# Patient Record
Sex: Female | Born: 1980 | Hispanic: Yes | Marital: Married | Smoking: Never smoker
Health system: Southern US, Community
[De-identification: ages and names within clinical notes are randomized; demographics above are authoritative.]

## PROBLEM LIST (undated history)

## (undated) DIAGNOSIS — Z9889 Other specified postprocedural states: Secondary | ICD-10-CM

## (undated) DIAGNOSIS — Z853 Personal history of malignant neoplasm of breast: Secondary | ICD-10-CM

## (undated) DIAGNOSIS — Z9221 Personal history of antineoplastic chemotherapy: Secondary | ICD-10-CM

## (undated) DIAGNOSIS — R112 Nausea with vomiting, unspecified: Secondary | ICD-10-CM

## (undated) DIAGNOSIS — Z923 Personal history of irradiation: Secondary | ICD-10-CM

---

## 2015-08-09 DIAGNOSIS — Z9221 Personal history of antineoplastic chemotherapy: Secondary | ICD-10-CM

## 2015-08-09 HISTORY — DX: Personal history of antineoplastic chemotherapy: Z92.21

## 2015-08-26 ENCOUNTER — Encounter: Payer: Self-pay | Admitting: Physician Assistant

## 2015-08-26 ENCOUNTER — Ambulatory Visit: Payer: Self-pay | Admitting: Physician Assistant

## 2015-08-26 VITALS — BP 124/68 | HR 83 | Temp 98.2°F | Ht 62.5 in | Wt 137.5 lb

## 2015-08-26 DIAGNOSIS — R519 Headache, unspecified: Secondary | ICD-10-CM

## 2015-08-26 DIAGNOSIS — K5900 Constipation, unspecified: Secondary | ICD-10-CM

## 2015-08-26 DIAGNOSIS — R51 Headache: Principal | ICD-10-CM

## 2015-08-26 DIAGNOSIS — N6459 Other signs and symptoms in breast: Secondary | ICD-10-CM

## 2015-08-26 MED ORDER — PROPRANOLOL HCL 20 MG PO TABS: ORAL_TABLET | ORAL | Status: DC

## 2015-08-26 NOTE — Progress Notes (Signed)
BP 124/68 mmHg  Pulse 83  Temp(Src) 98.2 F (36.8 C)  Ht 5' 2.5" (1.588 m)  Wt 137 lb 8 oz (62.37 kg)  BMI 24.73 kg/m2  SpO2 99%   Subjective:    Patient ID: Michele Salas, female    DOB: [birth year], 35 y.o.   MRN: [id number]  HPI: Michele Salas is a 35 y.o. female presenting on 08/26/2015 for Headache   HPI   Pt is here for f/u HA.  She has multiple other complaints that she wants to discuss including a breast problem, constipation and diarrhea.  She is not takig her propranolol.  Says she ran out of refills.  She is using otc analgesic 2 or 3 times/wwek.  She is sleeping fine.  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    Pt says she got Pap by "the [place] man" in [place] about 8 months ago.    Pt started with L breast pain 3 wk and one week ago it started bleeding if she mashes it.   She says she has not found a lump in it.   Relevant past medical, surgical, family and social history reviewed and updated as indicated. Interim medical history since our last visit reviewed. Allergies and medications reviewed and updated.  CURRENT MEDS: otc analgesic  Review of Systems  Constitutional: Positive for chills, diaphoresis, appetite change and fatigue. Negative for fever and unexpected weight change.  HENT: Negative for congestion, dental problem, drooling, ear pain, facial swelling, hearing loss, mouth sores, sneezing, sore throat, trouble swallowing and voice change.   Eyes: Positive for itching. Negative for pain, discharge, redness and visual disturbance.  Respiratory: Negative for cough, choking, shortness of breath and wheezing.   Cardiovascular: Negative for chest pain, palpitations and leg swelling.  Gastrointestinal: Positive for vomiting, abdominal pain, diarrhea and constipation. Negative for blood in stool.  Endocrine: Negative for cold intolerance, heat intolerance and polydipsia.  Genitourinary: Negative for dysuria, hematuria and  decreased urine volume.  Musculoskeletal: Positive for back pain. Negative for arthralgias and gait problem.  Skin: Negative for rash.  Allergic/Immunologic: Positive for environmental allergies.  Neurological: Positive for headaches. Negative for seizures, syncope and light-headedness.  Hematological: Negative for adenopathy.  Psychiatric/Behavioral: Negative for suicidal ideas, dysphoric mood and agitation. The patient is nervous/anxious.     Per HPI unless specifically indicated above     Objective:    BP 124/68 mmHg  Pulse 83  Temp(Src) 98.2 F (36.8 C)  Ht 5' 2.5" (1.588 m)  Wt 137 lb 8 oz (62.37 kg)  BMI 24.73 kg/m2  SpO2 99%  Wt Readings from Last 3 Encounters:  08/26/15 137 lb 8 oz (62.37 kg)    Physical Exam  Constitutional: She is oriented to person, place, and time. She appears well-developed and well-nourished.  HENT:  Head: Normocephalic and atraumatic.  Neck: Neck supple.  Pulmonary/Chest: Effort normal.  Abdominal: Soft. Bowel sounds are normal. She exhibits no mass. There is no tenderness.  Genitourinary: There is breast bleeding (L nipple- small amount). No breast swelling, tenderness or discharge.  (nurse Berenice assisted)  Lymphadenopathy:    She has no cervical adenopathy.  Neurological: She is alert and oriented to person, place, and time.  Skin: Skin is warm and dry.  Psychiatric: She has a normal mood and affect. Her behavior is normal.  Vitals reviewed.   No results found for this or any previous visit.    Assessment & Plan:   Encounter Diagnoses  Name  Primary?  Marland Kitchen Nonintractable headache, unspecified chronicity pattern, unspecified headache type Yes  . Bleeding from breast   . Constipation, unspecified constipation type       -Rx Propranolol sent for pt to get back on.  It has helped her HA in the past -Refer pt to [facility] for diagnostic [facility] -record of most recent PAP requested from dr in [place] -pt is given handout on  constipation.  We will address this more indepth at her next OV -F/u 1 month.  RTO sooner prn

## 2015-08-26 NOTE — Patient Instructions (Signed)
Estreimiento - Adultos (Constipation, Adult) Estreimiento significa que una persona tiene menos de tres evacuaciones en una semana, dificultad para defecar, o que las heces son secas, duras, o ms grandes que lo normal. A medida que envejecemos el estreimiento es ms comn. Una dieta baja en fibra, no tomar suficientes lquidos y el uso de ciertos medicamentos pueden Agricultural engineer.  CAUSAS   Ciertos medicamentos, como los antidepresivos, analgsicos, suplementos de hierro, anticidos y diurticos.  Algunas enfermedades, como la diabetes, el sndrome del colon irritable, enfermedad de la tiroides, o depresin.  No beber suficiente agua.  No consumir suficientes alimentos ricos en fibra.  Situaciones de estrs o viajes.  Falta de actividad fsica o de ejercicio.  Ignorar la necesidad sbita de Landscape architect.  Uso en exceso de laxantes. SIGNOS Y SNTOMAS   Defecar menos de tres veces por semana.  Dificultad para defecar.  Tener las heces secas y duras, o ms grandes que las normales.  Sensacin de estar lleno o hinchado.  Dolor en la parte baja del abdomen.  No sentir alivio despus de defecar. DIAGNSTICO  El mdico le har una historia clnica y un examen fsico. Pueden hacerle exmenes adicionales para el estreimiento grave. Estos estudios pueden ser:  Un radiografa con enema de bario para examinar el recto, el colon y, en algunos casos, el intestino delgado.  Una sigmoidoscopia para examinar el colon inferior.  Una colonoscopia para examinar todo el colon. TRATAMIENTO  El tratamiento depender de la gravedad del estreimiento y de la causa. Algunos tratamientos nutricionales son beber ms lquidos y comer ms alimentos ricos en fibra. El cambio en el estilo de vida incluye hacer ejercicios de [place] regular. Si estas recomendaciones para Animator dieta y en el estilo de vida no ayudan, el mdico le puede indicar el uso de laxantes de venta libre  para ayudarlo a Landscape architect. Los medicamentos recetados se pueden prescribir si los medicamentos de venta libre no lo [place].  INSTRUCCIONES PARA EL CUIDADO EN EL HOGAR   Consuma alimentos con alto contenido de [place], como frutas, vegetales, cereales integrales y porotos.  Limite los alimentos procesados ricos en grasas y azcar, como las papas fritas, hamburguesas, galletas, dulces y refrescos.  Puede agregar un suplemento de fibra a su dieta si no obtiene lo suficiente de los alimentos.  Beba suficiente lquido para Consulting civil engineer orina clara o de color amarillo plido.  Haga ejercicio regularmente o segn las indicaciones del mdico.  Vaya al bao cuando sienta la necesidad de ir. No se aguante las ganas.  Tome solo medicamentos de venta libre o recetados, segn las indicaciones del mdico. No tome otros medicamentos para el estreimiento sin consultarlo antes con su mdico. SOLICITE ATENCIN MDICA DE INMEDIATO SI:   Observa sangre brillante en las heces.  El estreimiento dura ms de 4 das o [place].  Siente dolor abdominal o rectal.  Las heces son delgadas como un lpiz.  Pierde peso de [place] inexplicable. ASEGRESE DE QUE:   Comprende estas instrucciones.  Controlar su afeccin.  Recibir ayuda de inmediato si no mejora o si empeora.   Esta informacin no tiene Marine scientist el consejo del mdico. Asegrese de hacerle al mdico cualquier pregunta que tenga.   Document Released: 08/14/2007 Document Revised: 08/15/2014 Elsevier Interactive Patient Education [employer].

## 2015-08-27 ENCOUNTER — Telehealth: Payer: Self-pay | Admitting: Student

## 2015-08-27 NOTE — Telephone Encounter (Signed)
Called and notified pt that I spoke to [place] at [facility] and she is to expect a call from one of the interpreters there. Pt understood.

## 2015-09-03 ENCOUNTER — Encounter: Payer: Self-pay | Admitting: Physician Assistant

## 2015-09-30 ENCOUNTER — Ambulatory Visit: Payer: Self-pay | Admitting: Physician Assistant

## 2015-10-07 DIAGNOSIS — Z853 Personal history of malignant neoplasm of breast: Secondary | ICD-10-CM

## 2015-10-07 HISTORY — DX: Personal history of malignant neoplasm of breast: Z85.3

## 2015-10-21 ENCOUNTER — Encounter: Payer: Self-pay | Admitting: Physician Assistant

## 2015-10-21 ENCOUNTER — Ambulatory Visit: Payer: Self-pay | Admitting: Physician Assistant

## 2015-10-21 VITALS — BP 110/66 | HR 83 | Temp 98.1°F | Ht 62.5 in | Wt 139.5 lb

## 2015-10-21 DIAGNOSIS — R519 Headache, unspecified: Secondary | ICD-10-CM

## 2015-10-21 DIAGNOSIS — R51 Headache: Principal | ICD-10-CM

## 2015-10-21 NOTE — Progress Notes (Signed)
BP 110/66 mmHg  Pulse 83  Temp(Src) 98.1 F (36.7 C)  Ht 5' 2.5" (1.588 m)  Wt 139 lb 8 oz (63.277 kg)  BMI 25.09 kg/m2  SpO2 99%   Subjective:    Patient ID: Michele Salas, female    DOB: 06/22/1981, 35 y.o.   MRN: [id number]  HPI: Michele Salas is a 35 y.o. female presenting on 10/21/2015 for Headache; Breast Problem; and Constipation   HPI   Chief Complaint  Patient presents with  . Headache    pt states she has not been having headaches anymore  . Breast Problem    pt states she's gone to [facility] and has gotten a biopsy. pt states she was told it was breast cancer  . Constipation    Pt states HA and constipation are both improved.  Pt states she has appointment with surgeon in [place] tomorrow for her breast cancer.  She doesn't know who it is with.   She says her husband is going with her to that appointment.    Relevant past medical, surgical, family and social history reviewed and updated as indicated. Interim medical history since our last visit reviewed. Allergies and medications reviewed and updated.  Current outpatient prescriptions:  .  propranolol (INDERAL) 20 MG tablet, 1 po bid. Tome una tableta por boca dos veces diarias, Disp: 60 tablet, Rfl: 3   Review of Systems  Constitutional: Positive for fatigue. Negative for fever, chills, diaphoresis, appetite change and unexpected weight change.  HENT: Negative for congestion, dental problem, drooling, ear pain, facial swelling, hearing loss, mouth sores, sneezing, sore throat, trouble swallowing and voice change.   Eyes: Negative for pain, discharge, redness, itching and visual disturbance.  Respiratory: Negative for cough, choking, shortness of breath and wheezing.   Cardiovascular: Negative for chest pain, palpitations and leg swelling.  Gastrointestinal: Negative for vomiting, abdominal pain, diarrhea, constipation and blood in stool.  Endocrine: Negative for cold intolerance, heat intolerance and  polydipsia.  Genitourinary: Negative for dysuria, hematuria and decreased urine volume.  Musculoskeletal: Positive for back pain. Negative for arthralgias and gait problem.  Skin: Negative for rash.  Allergic/Immunologic: Positive for environmental allergies.  Neurological: Negative for seizures, syncope, light-headedness and headaches.  Hematological: Negative for adenopathy.  Psychiatric/Behavioral: Negative for suicidal ideas, dysphoric mood and agitation. The patient is not nervous/anxious.     Per HPI unless specifically indicated above     Objective:    BP 110/66 mmHg  Pulse 83  Temp(Src) 98.1 F (36.7 C)  Ht 5' 2.5" (1.588 m)  Wt 139 lb 8 oz (63.277 kg)  BMI 25.09 kg/m2  SpO2 99%  Wt Readings from Last 3 Encounters:  10/21/15 139 lb 8 oz (63.277 kg)  08/26/15 137 lb 8 oz (62.37 kg)    Physical Exam  Constitutional: She is oriented to person, place, and time. She appears well-developed and well-nourished.  HENT:  Head: Normocephalic and atraumatic.  Neck: Neck supple.  Cardiovascular: Normal rate and regular rhythm.   Pulmonary/Chest: Effort normal and breath sounds normal.  Abdominal: Soft. Bowel sounds are normal. She exhibits no mass. There is no hepatosplenomegaly. There is no tenderness.  Musculoskeletal: She exhibits no edema.  Lymphadenopathy:    She has no cervical adenopathy.  Neurological: She is alert and oriented to person, place, and time.  Skin: Skin is warm and dry.  Psychiatric: She has a normal mood and affect. Her behavior is normal.  Vitals reviewed.   No results found for this or any  previous visit.    Assessment & Plan:    Encounter Diagnosis  Name Primary?  Marland Kitchen Nonintractable headache, unspecified chronicity pattern, unspecified headache type Yes    -pt to continue propranolol for headache prophylaxsis -keep appointments made by [facility] program for breast cancer -F/u 3 months.  She is told to RTO sooner for any problems

## 2015-10-22 ENCOUNTER — Encounter (HOSPITAL_COMMUNITY): Payer: Self-pay | Admitting: Oncology

## 2015-10-22 ENCOUNTER — Encounter (HOSPITAL_COMMUNITY): Payer: Self-pay

## 2015-10-22 ENCOUNTER — Encounter (HOSPITAL_COMMUNITY): Payer: Self-pay | Attending: Oncology | Admitting: Oncology

## 2015-10-22 VITALS — BP 124/74 | HR 78 | Temp 97.6°F | Ht 62.0 in | Wt 136.9 lb

## 2015-10-22 DIAGNOSIS — C773 Secondary and unspecified malignant neoplasm of axilla and upper limb lymph nodes: Secondary | ICD-10-CM

## 2015-10-22 DIAGNOSIS — R51 Headache: Secondary | ICD-10-CM | POA: Insufficient documentation

## 2015-10-22 DIAGNOSIS — C50112 Malignant neoplasm of central portion of left female breast: Secondary | ICD-10-CM

## 2015-10-22 DIAGNOSIS — C50912 Malignant neoplasm of unspecified site of left female breast: Secondary | ICD-10-CM | POA: Insufficient documentation

## 2015-10-22 DIAGNOSIS — K219 Gastro-esophageal reflux disease without esophagitis: Secondary | ICD-10-CM | POA: Insufficient documentation

## 2015-10-22 DIAGNOSIS — Z171 Estrogen receptor negative status [ER-]: Secondary | ICD-10-CM

## 2015-10-22 DIAGNOSIS — Z79899 Other long term (current) drug therapy: Secondary | ICD-10-CM | POA: Insufficient documentation

## 2015-10-22 DIAGNOSIS — N644 Mastodynia: Secondary | ICD-10-CM | POA: Insufficient documentation

## 2015-10-22 DIAGNOSIS — R1013 Epigastric pain: Secondary | ICD-10-CM | POA: Insufficient documentation

## 2015-10-22 LAB — CBC WITH DIFFERENTIAL/PLATELET
Basophils Absolute: 0 10*3/uL (ref 0.0–0.1)
Basophils Relative: 0 %
Eosinophils Absolute: 0.1 10*3/uL (ref 0.0–0.7)
Eosinophils Relative: 1 %
HCT: 35.4 % — ABNORMAL LOW (ref 36.0–46.0)
Hemoglobin: 11.3 g/dL — ABNORMAL LOW (ref 12.0–15.0)
Lymphocytes Relative: 26 %
Lymphs Abs: 2.3 10*3/uL (ref 0.7–4.0)
MCH: 23.8 pg — ABNORMAL LOW (ref 26.0–34.0)
MCHC: 31.9 g/dL (ref 30.0–36.0)
MCV: 74.5 fL — ABNORMAL LOW (ref 78.0–100.0)
Monocytes Absolute: 0.6 10*3/uL (ref 0.1–1.0)
Monocytes Relative: 6 %
Neutro Abs: 5.8 10*3/uL (ref 1.7–7.7)
Neutrophils Relative %: 67 %
Platelets: 394 10*3/uL (ref 150–400)
RBC: 4.75 MIL/uL (ref 3.87–5.11)
RDW: 15.4 % (ref 11.5–15.5)
WBC: 8.8 10*3/uL (ref 4.0–10.5)

## 2015-10-22 LAB — COMPREHENSIVE METABOLIC PANEL
ALT: 23 U/L (ref 14–54)
AST: 19 U/L (ref 15–41)
Albumin: 4.5 g/dL (ref 3.5–5.0)
Alkaline Phosphatase: 86 U/L (ref 38–126)
Anion gap: 8 (ref 5–15)
BUN: 12 mg/dL (ref 6–20)
CO2: 24 mmol/L (ref 22–32)
Calcium: 9.1 mg/dL (ref 8.9–10.3)
Chloride: 106 mmol/L (ref 101–111)
Creatinine, Ser: 0.52 mg/dL (ref 0.44–1.00)
GFR calc Af Amer: >60 mL/min (ref >60)
GFR calc non Af Amer: >60 mL/min (ref >60)
Glucose, Bld: 96 mg/dL (ref 65–99)
Potassium: 3.7 mmol/L (ref 3.5–5.1)
Sodium: 138 mmol/L (ref 135–145)
Total Bilirubin: 0.7 mg/dL (ref 0.3–1.2)
Total Protein: 8 g/dL (ref 6.5–8.1)

## 2015-10-22 NOTE — Progress Notes (Signed)
[facility] Hematology/Oncology Consultation   Name: Michele Salas      MRN: [id number]    Date: 10/22/2015 Time:5:51 PM   REFERRING PHYSICIAN:  Aviva Signs, MD (Gen Surg)  REASON FOR CONSULT:  Left sided breast cancer   DIAGNOSIS:  Invasive ductal carcinoma, high grade, of left breast.  HISTORY OF PRESENT ILLNESS:   [name] is a 35 year old Hispanic woman with a past medical history significant for headaches who is referred to the [facility] for a new diagnosis of left breast cancer.    Breast cancer, left ([place])   09/16/2015 Mammogram 1.6 cm retroareolar left breast mass measuring 1.6 cm in largest diameter in addition to 3 abnormal appearing left inferior axillary lymph nodes.   09/23/2015 Initial Biopsy US guided core biopsy of left breast mass and suspicious left axillary lymph nodes.   09/23/2015 Pathology Results Invasive ductal carcinoma, high grade.  Left axilla lymph node is positive for metastatic carcinoma.  ER/PR NEGATIVE, HER2 NEGATIVE, KI-67 marker of 85%.     I personally reviewed and went over laboratory results with the patient.  The results are noted within this dictation.  We will update labs today.  I personally reviewed and went over radiographic studies with the patient.  The results are noted within this dictation.    I personally reviewed and went over pathology results with the patient.  Chart reviewed.  Patient reports that approximately In January 2017 she noted left breast pain with bloody nipple discharge. She reported to the free clinic in February 2017 at which time a diagnostic mammogram was performed. Mammography was performed at [facility] on 09/16/2015 revealing a left breast mass measuring 1.6 cm in the 12:00 position in addition to 3 left axillary lymph nodes that were suspicious for disease. She underwent an ultrasound-guided core biopsy of left breast mass in addition to left axillary lymph  node.  She's been seen by general surgery, Dr. Arnoldo Morale, who has helped develop surgical plans moving forward. She seen today in consultation in medical oncology recommendations. When the patient was seen initially in the medical oncology clinic, we did not have prognostic panel from [facility] available. As a result, we requested a fax of systems information. By the end of today's visit, we did have this and therefore we are able to provide solid medical oncology recommendations.  Patient reports a good appetite. She denies any weight loss. Other than her left breast discomfort, she denies any complaint. She denies any chest pain. She notes intermittent abdominal pain is in the epigastric area particularly after eating greasy/fatty foods.  After receiving breast prognostic panel, medical oncology recommendations were provided. She is triple negative and therefore would not benefit from any neoadjuvant HER-2 targeted therapy.     Review of Systems  Constitutional: Negative for fever, chills, weight loss, malaise/fatigue and diaphoresis.  HENT: Negative for congestion and sore throat.   Eyes: Negative for blurred vision, double vision and redness.  Respiratory: Negative for cough, hemoptysis, sputum production, shortness of breath and wheezing.   Cardiovascular: Negative for chest pain, palpitations and leg swelling.  Gastrointestinal: Positive for abdominal pain. Negative for heartburn, nausea, vomiting, diarrhea, constipation, blood in stool and melena.       Epigastric pain with eating greasy/fatty foods  Genitourinary: Negative for dysuria, frequency and hematuria.  Musculoskeletal: Negative for myalgias, back pain, joint pain, falls and neck pain.  Skin: Negative for itching and rash.  Neurological: Positive for headaches. Negative for dizziness, tingling, tremors, sensory change, speech change, focal weakness and weakness.  Endo/Heme/Allergies: Negative.   Psychiatric/Behavioral: Negative.     HORMONAL RISK FACTORS:  Menarche was at age 70.  First live birth at age 69.  OCP use for approximately not sure how long she took birth control for, but reports that she has not taken in 32 years.  Ovaries intact: yes.  Hysterectomy: no.  Menopausal status: premenopausal, but periods are irregular. HRT use: 0 years. Colonoscopy: n/a; not examined. Mammogram within the last year: this was her first mammogram. Number of breast biopsies: 1. Up to date with pelvic exams: yes. Any excessive radiation exposure in the past: no   PAST MEDICAL HISTORY:   Past Medical History  Diagnosis Date  . GERD (gastroesophageal reflux disease)   . Breast cancer, left ([place]) 10/22/2015    ALLERGIES: No Known Allergies    MEDICATIONS: I have reviewed the patient's current medications.    Current Outpatient Prescriptions on File Prior to Visit  Medication Sig Dispense Refill  . propranolol (INDERAL) 20 MG tablet 1 po bid. Tome una tableta por boca dos veces diarias 60 tablet 3   No current facility-administered medications on file prior to visit.     PAST SURGICAL HISTORY History reviewed. No pertinent past surgical history.  FAMILY HISTORY: Family History  Problem Relation Age of Onset  . Diabetes Mother   Mother is alive at the age of 4. She has hypertension and diabetes. She lives in [place]. Father is deceased at unknown age and for unknown reason. She reports "he was old." Brothers 59 years old and healthy. She has many half siblings, many of whom live in [place]. She has 3 children. She is a 62 year old daughter who lives in [place], 15 year old son who lives here in [place], and 11 year old daughter who lives in [place]. All children are healthy.  SOCIAL HISTORY: Patient denies any tobacco abuse. Rarely does she have alcoholic beverage. She denies any illicit drug abuse. 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 She is religious and  Catholic. She is not a documented citizen of the [place]. She is married.  PERFORMANCE STATUS: The patient's performance status is 0 - Asymptomatic  PHYSICAL EXAM: Most Recent Vital Signs: Blood pressure 124/74, pulse 78, temperature 97.6 F (36.4 C), height 5' 2"  (1.575 m), weight 136 lb 14.4 oz (62.097 kg), SpO2 100 %. General appearance: alert, cooperative, appears stated age, no distress and Hispanic, non-english speaking, accompanied by her niece who translates and her husband Head: Normocephalic, without obvious abnormality, atraumatic Eyes: negative findings: lids and lashes normal, conjunctivae and sclerae normal and corneas clear Throat: lips, mucosa, and tongue normal; teeth and gums normal Neck: no adenopathy, supple, symmetrical, trachea midline and thyroid not enlarged, symmetric, no tenderness/mass/nodules Lungs: clear to auscultation bilaterally and normal percussion bilaterally Breasts: positive findings: nipple discharge on the left and 2.0 cm, irregular, firm, fixed and non-tender nodule located on the left deep to areola favoring upper-outer quadrant Heart: regular rate and rhythm, S1, S2 normal, no murmur, click, rub or gallop Abdomen: soft, non-tender; bowel sounds normal; no masses,  no organomegaly Extremities: extremities normal, atraumatic, no cyanosis or edema Skin: Skin color, texture, turgor normal. No rashes or lesions Lymph nodes: Axillary adenopathy: left anterior line adenopathy Neurologic: Alert and oriented X 3, normal strength and tone. Normal symmetric reflexes. Normal coordination and gait  LABORATORY DATA:  Results for orders placed or performed  in visit on 10/22/15 (from the past 48 hour(s))  CBC with Differential     Status: Abnormal   Collection Time: 10/22/15  2:52 PM  Result Value Ref Range   WBC 8.8 4.0 - 10.5 K/uL   RBC 4.75 3.87 - 5.11 MIL/uL   Hemoglobin 11.3 (L) 12.0 - 15.0 g/dL   HCT 35.4 (L) 36.0 - 46.0 %   MCV 74.5 (L) 78.0 - 100.0 fL    MCH 23.8 (L) 26.0 - 34.0 pg   MCHC 31.9 30.0 - 36.0 g/dL   RDW 15.4 11.5 - 15.5 %   Platelets 394 150 - 400 K/uL   Neutrophils Relative % 67 %   Neutro Abs 5.8 1.7 - 7.7 K/uL   Lymphocytes Relative 26 %   Lymphs Abs 2.3 0.7 - 4.0 K/uL   Monocytes Relative 6 %   Monocytes Absolute 0.6 0.1 - 1.0 K/uL   Eosinophils Relative 1 %   Eosinophils Absolute 0.1 0.0 - 0.7 K/uL   Basophils Relative 0 %   Basophils Absolute 0.0 0.0 - 0.1 K/uL  Comprehensive metabolic panel     Status: None   Collection Time: 10/22/15  2:52 PM  Result Value Ref Range   Sodium 138 135 - 145 mmol/L   Potassium 3.7 3.5 - 5.1 mmol/L   Chloride 106 101 - 111 mmol/L   CO2 24 22 - 32 mmol/L   Glucose, Bld 96 65 - 99 mg/dL   BUN 12 6 - 20 mg/dL   Creatinine, Ser 0.52 0.44 - 1.00 mg/dL   Calcium 9.1 8.9 - 10.3 mg/dL   Total Protein 8.0 6.5 - 8.1 g/dL   Albumin 4.5 3.5 - 5.0 g/dL   AST 19 15 - 41 U/L   ALT 23 14 - 54 U/L   Alkaline Phosphatase 86 38 - 126 U/L   Total Bilirubin 0.7 0.3 - 1.2 mg/dL   GFR calc non Af Amer >60 >60 mL/min   GFR calc Af Amer >60 >60 mL/min    Comment: (NOTE) The eGFR has been calculated using the CKD EPI equation. This calculation has not been validated in all clinical situations. eGFR's persistently <60 mL/min signify possible Chronic Kidney Disease.    Anion gap 8 5 - 15      RADIOGRAPHY: No results found.     PATHOLOGY:   Pathology from [facility]:  Biopsy of left breast mass at the 12:00 position demonstrates invasive ductal carcinoma, high-grade.  Left axillary lymph node biopsy demonstrates invasive ductal carcinoma, high-grade, consistent with metastasis to lymph node. Breast prognostic panel is performed demonstrating ER/PR negativity, HER-2/neu negativity, and a KI-67 Marker of 85%.  ASSESSMENT/PLAN:   Breast cancer, left (H[facility]) Left-sided breast cancer, high-grade, measuring 1.6 cm in largest dimension with biopsy-proven metastatic disease to left axillary  lymph node(s), ER/PR negative, HER-2 negative with a high KI-67 Marker of 85%.  Patient is Hispanic, non-English-speaking, and an undocumented citizen.  She is provided education regarding her breast cancer and future treatment. She knows that she will have upcoming definitive surgical intervention by Dr. Arnoldo Morale, followed by systemic chemotherapy, followed by XRT. Given breast size and location of tumor if she were to proceed with surgical therapy first would recommend a mastectomy. She may be a candidate for neoadjuvant chemotherapy however I do not have all information pertaining to her axillary LN status and advised her that this would be needed. Would proceed as follows:  We will perform laboratory work today: CBC and differential, and platelet  metabolic panel  We will stage her completely with CT scans of chest, abdomen, and pelvis with contrast. She will also undergo bone scan.  We will consult CSW. Message will be sent.  We will consult chaplaincy services for assistance moving forward.   Given the patient's lack of citizenship, we will be preemptive and message radiation oncologist so there prepared for future treatment.   We'll refer the patient to genetic counselor ASAP as this will help guide surgical options moving forward as well.    She would benefit from [place] getting her in contact with a similar patient who is completing therapy.  We will try to arrange this accordingly.  She will need a 2-D echo following surgery in preparation for systemic chemotherapy. Order is placed and this will be scheduled. Next  She'll return in 2  weeks for follow-up appointment and future planning of systemic chemotherapy. She will be set up with our nurse navigator for chemotherapy teaching. We will have Dr. Arnoldo Morale place a Port-A-Cath for chemotherapy.  She will consider reconstruction in the future. She confirms that she is not interested in having more children.   All questions were answered. The  patient knows to call the clinic with any problems, questions or concerns. We can certainly see the patient much sooner if necessary.  Patient and plan discussed with Dr. Ancil Linsey and she is in agreement with the aforementioned.   This note is electronically signed by: Molli Hazard, MD   10/22/2015 5:51 PM

## 2015-10-22 NOTE — Patient Instructions (Addendum)
[place] at [facility] Discharge Instructions  RECOMMENDATIONS MADE BY THE CONSULTANT AND ANY TEST RESULTS WILL BE SENT TO YOUR REFERRING PHYSICIAN.  We have you set up for a Espanol translator starting now and moving forward. There will be a Optometrist who meets you at your appointments within the [place].   We are drawing blood work today.  Exam done and seen by Kirby Crigler, PA-C  We will get you scheduled for CT scans of chest, abdomen, pelvis.  We will also perform a bone scan.  All of this imaging will be done to make sure your cancer is only located in the left breast.    Based upon your breast cancer markers, you will proceed with surgery, and then see [place] back for further treatment recommendations.  This will be the plan UNLESS you have abnormal imaging on CT scans or bone scan.  The breast cancer you have is "triple negative."  Therefore, the order/planned of treatment is:  1. Surgery  2. Chemotherapy  3. Radiation therapy  Also, in the near future, we will get you an appointment with our genetics counselor for consultation.  This can be done after surgery.  We gave you some education and reading information regarding your breast cancer.  We will ask a patient of ours to reach out to you and help you during your treatment.  You got labs today. CT scan and Bone scan next week hopefully. ECHO in 3weeks Return to see Dr.Penland in 2-3 weeks.  Below is some information in Spanish regarding breast cancer.   Thank you for choosing [place] at [facility] to provide your oncology and hematology care.  To afford each patient quality time with our provider, please arrive at least 15 minutes before your scheduled appointment time.   Beginning January 23rd 2017 lab work for the [facility] will be done in the  Main lab at [facility] on 1st floor. If you have a lab appointment with the [facility] please come in thru the   Main Entrance and check in at the main information desk  You need to re-schedule your appointment should you arrive 10 or more minutes late.  We strive to give you quality time with our providers, and arriving late affects you and other patients whose appointments are after yours.  Also, if you no show three or more times for appointments you may be dismissed from the clinic at the providers discretion.     Again, thank you for choosing [facility].  Our hope is that these requests will decrease the amount of time that you wait before being seen by our physicians.       _____________________________________________________________  Should you have questions after your visit to [facility], please contact our office at [phone] between the hours of 8:30 a.m. and 4:30 p.m.  Voicemails left after 4:30 p.m. will not be returned until the following business day.  For prescription refill requests, have your pharmacy contact our office.         Resources For Cancer Patients and their Caregivers ? American Cancer Society: Can assist with transportation, wigs, general needs, runs Look Good Feel Better.        614-647-0240 ? Cancer Care: Provides financial assistance, online support groups, medication/co-pay assistance.  1-800-813-HOPE 574-850-5733) ? [place] Assists [place] cancer patients and their families through emotional , educational and financial support.  272 304 3209 ? [place] DSS Where to apply for food stamps, Medicaid and utility assistance. 218-558-6232 ? RCATS: Transportation to medical appointments. 848-351-1474 ? Social Security Administration: May apply for disability if have a Stage IV cancer. 928-204-6685 [phone] ? [employer], Disability and Transit Services: Assists with nutrition, care and transit needs. [place] (Breast Cancer, Female) El cncer de mama es un  crecimiento anormal de tejido (tumor) en la mama, que es canceroso ([place]). A diferencia de los tumores no cancerosos (benignos) los tumores malignos pueden diseminarse a otras partes del cuerpo. El tipo ms comn de cncer de mama femenino comienza en los conductos galactforos (carcinoma ductal). El cncer de mama es uno de los tipos ms comunes de cncer en las mujeres. CAUSAS  Se desconoce la causa exacta del cncer de mama femenino.  FACTORES DE RIESGO  Tener ms de 3 aos.  Tener antecedentes familiares de cncer de mama.  Ser portadora de los genes BRCA 1 y BRCA 2.  Antecedentes personales de exposicin a la radiacin.  Obesidad.  Perodos menstruales que comienzan antes de los 12 aos.  Menopausia que comienza despus de los 44 aos.  Primer embarazo a la edad de 35 aos o ms.  Terapia hormonal.  Consumir ms de una bebida alcohlica por da. SIGNOS Y SNTOMAS   Un bulto indoloro en la mama.  Cambios en el tamao o la forma de la mama.  Cambios en la piel de la mama, como bultos u hoyuelos  Anormalidades del pezn, como [place], [place], enrojecimiento o pozos (retraccin).  Secrecin clara o sanguinolenta por el pezn. DIAGNSTICO  El Viacom preguntar sobre su historia clnica. Tambin podr realizar algunos procedimientos como:  Un examen fsico. Esto consiste en palpar el tejido alrededor de la mama y debajo de los brazos.  Toma de [place] de la secrecin del pezn. La muestra se examinar con un microscopio.  Radiografas de mama (mamografa), ecografas de mama o una tomografa computarizada.  Toma de [place] de tejido (biopsia) de la mama. La muestra se examinar con un microscopio para determinar si hay clulas cancerosas. El tumor se estadificar para determinar su gravedad y extensin. La estadificacin es un intento cuidadoso para diagnosticar el tamao del tumor, si se ha propagado, y si es as, a Brewing technologist del cuerpo. Es posible que  tenga que hacerse ms estudios para Teacher, adult education el estadio o etapa del cncer:  Etapa 0: el tumor no se ha extendido a otro tejido de Scientist, research (medical).  Etapa I: el cncer se encuentra solamente en la mama. El tumor puede ser de hasta  pulgada (2 cm) de ancho.  Etapa II: el cncer se ha extendido a los ganglios linfticos cercanos. El tumor puede ser de hasta 2 pulgadas (5 cm) de ancho.  Etapa III: el cncer se ha extendido a los ganglios linfticos ms distantes. El tumor puede ser de ms de 2 pulgadas (5 cm) de ancho.  Etapa IV: el cncer se ha extendido a otras partes del cuerpo, como los [place], el hgado, el cerebro o los pulmones. TRATAMIENTO  Segn el tipo y la etapa, el cncer de mama femenino se puede tratar con una o ms de las siguientes terapias:  [place] para extirpar solo el tumor (mastectoma parcial) o toda la mama (mastectoma). Tambin podrn extirparse los ganglios linfticos.  Radioterapia, que utiliza rayos de alta energa para destruir las clulas cancerosas.  Quimioterapia, que es el uso de medicamentos para [employer]  clulas cancerosas.  Terapia hormonal, que consiste en tomar medicamentos para [employer] de hormonas en el cuerpo. Puede tomar medicamentos para disminuir los niveles de [place]. Esto puede ayudar a impedir el crecimiento de las clulas cancerosas. [place] los medicamentos solamente como se lo haya indicado el mdico.  Mantenga una dieta saludable.  Considere participar en un grupo de apoyo. Esto puede ayudarlo a enfrentar el estrs de sufrir esta enfermedad.  Concurra a todas las visitas de control, segn le indique su mdico. SOLICITE ATENCIN MDICA SI:  El dolor aumenta repentinamente.  Nota un nuevo bulto en cualquiera de las mamas o debajo del brazo.  Se hincha el brazo o la [place].  Pierde peso sin proponrselo.  Tiene fiebre.  Aumenta su fatiga o debilidad. SOLICITE ATENCIN MDICA DE  INMEDIATO SI:   Tiene dolor en el pecho o dificultad para respirar.  Se desmaya.   Esta informacin no tiene Marine scientist el consejo del mdico. Asegrese de hacerle al mdico cualquier pregunta que tenga.   Document Released: 12/09/2013 Document Revised: 04/15/2015 Elsevier Interactive Patient Education 2016 Amite isotpica (MUGA Scan) La VGI (ventriculografa isotpica) toma imgenes del corazn en momentos especficos mientras late. En este estudio, se inyecta una sustancia radioactiva (marcador radioactivo) en el torrente sanguneo. La sustancia permite al mdico observar los glbulos rojos que pasan por el corazn. La VGI muestra lo siguiente:  La cantidad de sangre que bombea el corazn con cada latido.  Si los ventrculos estn funcionando bien. Los ventrculos son cmaras que se encuentran en la parte inferior del corazn. Bombean sangre a los pulmones y al resto del cuerpo. La VGI puede realizarse para determinar lo siguiente:  Las causas de los sntomas cardacos, como dolor en el pecho.  Si las arterias que irrigan el corazn estn bloqueadas.  Si tiene enfermedad de las vlvulas cardacas.  Si el corazn est daado debido a un infarto de miocardio.  Si el corazn tiene problemas para Chiropodist. INFORME A SU MDICO:  Cualquier alergia que tenga.  Todos los Lyondell Chemical, incluidos vitaminas, hierbas, gotas oftlmicas, cremas y medicamentos de venta libre.  Enfermedades de la sangre que tenga.  Si tiene cirugas previas.  Enfermedades que tenga.  Si est embarazada, si podra estar embarazada o si est amamantando. Esto es PepsiCo. RIESGOS Y COMPLICACIONES En general, se trata de un procedimiento seguro. Sin embargo, pueden ocurrir [employer] Banc[employer], que Verizon siguientes:  Risk analyst al The TJX Companies radioactivo. Este efecto secundario es poco frecuente.  Dolor y Neurosurgeon donde  se coloc la va intravenosa.  Posible dao en el feto si est embarazada o en el beb si est amamantando. ANTES DEL PROCEDIMIENTO  No tome los medicamentos que toma habitualmente antes del procedimiento si el mdico le indica que no lo haga. Consulte a su mdico si debe cambiar o suspender estos medicamentos.  No ingiera bebidas que contengan cafena durante varias horas antes de realizarse [place]. Entre las bebidas que contienen cafena se incluyen el caf, el t y [place].  Pregntele al mdico si junto con la VGI deber realizar una prueba de esfuerzo.  En el caso de que tenga que realizar una prueba de esfuerzo junto con la VGI, no coma ni tome nada, excepto agua, durante las 4 horas previas al [place].  Use ropa cmoda y suelta. PROCEDIMIENTO  Se le pedir que se acueste en una camilla de exploracin.  Se le colocar una va intravenosa (IV) en una de las venas. Durante el procedimiento, se le administrarn medicamentos por una sonda.  Le colocarn discos llamados electrodos en el trax, los brazos y las piernas. Los electrodos estarn conectados con cables a Tour manager.  Se inyectar una pequea cantidad de marcador radioactivo por va intravenosa. A medida que pase por la va [place], puede sentir una sensacin fra en el brazo.  Le colocarn una cmara [employer], que tomar una serie de imgenes mientras se encuentra recostado.  La prueba de esfuerzo a la que tal vez se Designer, fashion/clothing en caminar sobre una cinta caminadora o usar una bicicleta fija. Luego, se le pedir que se vuelva a recostar sobre la camilla de exploracin para tomar ms imgenes.  Una vez que se hayan tomado todas las imgenes, la va IV se Charity fundraiser. DESPUS DEL PROCEDIMIENTO  Beba suficiente lquido para mantener la orina clara o de color amarillo plido. Esto ayudar a Musician radioactivo del cuerpo.  Es su responsabilidad retirar el resultado del [place]. Consulte en  el laboratorio o en el departamento en el que fue realizado el estudio cundo y cmo podr The TJX Companies.  Concurra a todas las visitas de control como se lo haya indicado el mdico. Esto es importante.   Esta informacin no tiene Marine scientist el consejo del mdico. Asegrese de hacerle al mdico cualquier pregunta que tenga.   Document Released: 05/15/2013 Document Revised: 08/15/2014 Elsevier Interactive Patient Education 2016 [place] (Chemotherapy) La quimioterapia es el uso de medicamentos para Scientist, water quality o lentificar el crecimiento de clulas cancerosas. Segn el tipo y el estadio del cncer, puede recibir quimioterapia para:  Veterinary surgeon.  Lentificar el progreso del cncer.  Aliviar los sntomas del cncer.  Mejorar los beneficios del tratamiento de radiacin.  Reducir el tamao del tumor antes de la [place].  Eliminar las clulas cancerosas que permanecen en el cuerpo despus de que el tumor se haya extirpado. [employer] SE ADMINISTRA [place]? La quimioterapia se puede administrar de las siguientes maneras:  Por boca, en forma de lquido o comprimido.  A travs de un tubo delgado que se coloca en una vena o arteria.  Mediante una inyeccin.  Mediante la aplicacin de crema o ungento en la piel.  A travs de lquidos que se administran directamente en varias reas del cuerpo, como el abdomen, el pecho o la vejiga. CON QU FRECUENCIA SE ADMINISTRA LA QUIMIOTERAPIA? La quimioterapia puede administrarse de [place] continua a lo largo de un periodo de tiempo o en ciclos. Por ejemplo, puede tomar el medicamento durante una semana por mes. [place]? La duracin del tratamiento depende de muchos factores, entre ellos:  El tipo de Hotel manager.  Si el cncer se ha diseminado.  Cmo responde a la quimioterapia.  Si desarrolla efectos secundarios. Algunos tipos de medicamentos para  la quimioterapia se administran solo Child psychotherapist. Otros se indican durante meses, aos o de por vida. [place]? Los medicamentos para la quimioterapia son muy fuertes. Estarn presentes en todos los lquidos corporales, como la orina, la materia fecal, la saliva, el sudor, las lgrimas, las secreciones vaginales y Theatre stage manager. Debe seguir cuidadosamente algunas precauciones de seguridad para evitar daar a Microbiologist tome estos medicamentos. A continuacin se mencionan las precauciones recomendadas:  Asegrese de que las personas que cuidan de usted usen guantes desechables si entrarn  en contacto con cualquiera de los lquidos corporales. Las mujeres embarazadas o que amamantan no deben manipular ninguno de los lquidos corporales.  Mulat y las sbanas que puedan contener lquidos [employer] veces en el lavarropas con agua muy caliente.  Deseche paales de adultos, tampones y toallas sanitarias colocndolos primero en una bolsa de plstico.  Use preservativo cuando mantenga relaciones sexuales durante al menos 2 semanas despus de recibir la quimioterapia.  No comparta bebidas ni alimentos.  Mantenga los medicamentos de la quimioterapia en sus envases originales. Gurdelos en un lugar alto y [place], fuera del alcance de los nios. No los exponga al calor ni a la humedad. No los coloque en envases con otros tipos de medicamentos.  Deseche los envoltorios de sus medicamentos de quimioterapia colocndolos en una bolsa plstica por separado.  No deseche los medicamentos sobrantes ni los tire en el inodoro. Lleve los medicamentos que no usar al [employer] mdico donde se podrn Comptroller de [place] apropiada.  Siga las indicaciones del mdico para la eliminacin apropiada de agujas, vas intravenosas y otros materiales sanitarios que hayan entrado en contacto con los medicamentos para la  quimioterapia.  Si se le proporciona un contenedor de Technical sales engineer, asegrese de comprender las instrucciones de [place].  Lvese bien las manos con agua tibia y jabn despus de usar el bao. Squese las manos con toallas de papel desechables.  Cuando use el inodoro:  Secondary school teacher correr [place] [employer] despus de cada uso, incluso despus de vomitar.  Cierre la tapa del inodoro antes de Field seismologist correr Wellsite geologist. Esto ayuda a evitar salpicaduras.  Los hombres y las mujeres deben sentarse para usar el inodoro. Esto ayuda a evitar salpicaduras. [place]? Los efectos secundarios dependen de varios factores, entre ellos:  El tipo especfico de medicamento para quimioterapia utilizado.  La dosis.  La duracin del tratamiento.  El [place] de salud general. Algunos de los efectos secundarios que puede presentar son los siguientes:  Fatiga y disminucin de Sales promotion account executive.  Prdida del apetito.  Cambios en el sentido del olfato y [place].  Nuseas.  Vmitos.  Estreimiento o diarrea.  Prdida del cabello.  Mayor propensin a las infecciones.  Fcil sangrado.  Llagas en la boca.  Quemazn u hormigueo de las manos y los pies.  Problemas de memoria.   Esta informacin no tiene Marine scientist el consejo del mdico. Asegrese de hacerle al mdico cualquier pregunta que tenga.   Document Released: 11/10/2008 Document Revised: 08/15/2014 Elsevier Interactive Patient Education 2016 [place] sea (Bone Scan) Dannielle Karvonen sea es un estudio que se realiza para Education officer, environmental en los [place]. Durante el [place], se inyecta un Pharmacist, community. Los huesos absorben Avaya. [employer], los huesos se ven ms fcilmente cuando se toman fotografas (imgenes) con un tipo de cmara que se Best Buy. ANTES DEL PROCEDIMIENTO  Consulte al mdico si debe cambiar o suspender sus medicamentos  habituales. Esto es importante si toma medicamentos para la diabetes o anticoagulantes.  No tome medicamentos que contengan bismuto durante 4das antes del estudio.  No se haga radiografas con bario durante 4das antes del estudio.  No use alhajas que tengan metal durante el estudio.  No tome demasiado lquido Valero Energy anteriores al [place]. Deber tomar muchos vasos de agua en el transcurso del [place]. PROCEDIMIENTO  Se le colocar una va intravenosa (IV) en una de las venas.  Se recostar en una camilla.  El Geneticist, molecular se Tour manager a travs de la va intravenosa (IV).  Es posible que algunas fotografas se tomen de inmediato.  Luego, beber de 6 a 8vasos de [place].  Puede ser necesario que espere muchas horas antes de que le tomen ms fotografas.  Luego, regresar a la camilla.  La cmara puede desplazarse alrededor del cuerpo.  Pueden solicitarle que se quede quieto o que cambie de posicin. Este procedimiento puede variar segn el mdico y el hospital. DESPUS DEL PROCEDIMIENTO  Ileene Hutchinson deba esperar que un especialista verifique las fotografas para asegurarse de que sean ntidas.  Si es necesario, pueden tomarle ms fotografas.  Se lo controlar para garantizar que no tenga una reaccin adversa al estudio o al [employer]JX Companies.  Consulte al mdico cundo y cmo podr The TJX Companies.  Retome sus actividades habituales como se lo haya indicado el mdico.   Esta informacin no tiene Marine scientist el consejo del mdico. Asegrese de hacerle al mdico cualquier pregunta que tenga.   Document Released: 08/27/2010 Document Revised: 12/09/2014 Elsevier Interactive Patient Education 2016 [place] perfusin implantable: gua para Engineer, mining (Implanted General Electric) Un dispositivo de perfusin implantable es un tipo de va central que se coloca debajo de la piel. Las vas centrales se usan para proporcionar un Diplomatic Services operational officer (IV)  cuando es necesario administrarle un tratamiento o nutricin a Ardelia Mems persona a travs de las venas. Los dispositivos de perfusin implantables se usan para proporcionar un acceso intravenoso a Barrister's clerk. Un dispositivo de perfusin implantable se puede colocar por los siguientes motivos:   Necesita administrarse un medicamento intravenoso que podra provocar una irritacin en las venas pequeas de las manos o los brazos.  Necesita medicamentos por va intravenosa a largo plazo, como antibiticos.  Necesita recibir nutricin por va intravenosa durante un largo perodo.  Es necesario que le extraigan sangre con frecuencia para Optometrist anlisis de laboratorio.  Debe someterse a dilisis. Los dispositivos de perfusin implantables generalmente se colocan en la zona del pecho, pero tambin se pueden colocar en la parte superior del brazo, el abdomen o la pierna. Un dispositivo de perfusin implantable tiene dos partes principales:   Depsito. El depsito es redondo y Lexicographer como una zona pequea y prominente en la piel. El depsito es la parte en donde se inserta la aguja para Architectural technologist los medicamentos o Merchant navy officer.  Catter. El catter es un tubo delgado y flexible que se extiende desde el depsito. El catter se coloca en una vena grande. Los medicamentos que se introducen en el depsito, pasan a travs del catter y luego llegan a la vena. [place]? No humedezca el lugar de la incisin. Bese o dchese segn las indicaciones del mdico.  CMO SE ACCEDE AL DISPOSITIVO DE PERFUSIN? Para acceder al dispositivo debern seguirse algunos pasos especiales.   Antes del procedimiento, podr colocarse una crema con anestesia sobre la piel. Esto hace adormecer la piel que se encuentra sobre el dispositivo.  Para acceder al dispositivo el mdico usar una tcnica estril.  El mdico debe colocarse Judene Companion y guantes estriles.  La piel alrededor del  dispositivo se limpia cuidadosamente con un antisptico y se Scientist, research (life sciences).  Para introducir la aguja en el dispositivo este se debe sostener suavemente entre los guantes estriles.  Slo deben utilizarse agujas "non.coring" (que no dejan agujero) para acceder al dispositivo. Una vez que se ha accedido al dispositivo, deber [employer] Bath & Beyond  el retorno de Herbalist. Esto ayuda a Forensic scientist dispositivo est en la vena y no est obstruido.  Si es necesario acceder al dispositivo de manera constante para una infusin, se colocar un apsito transparente por encima de la zona de la aguja. El apsito y la aguja debern cambiarse todas las semanas o segn las indicaciones del mdico.  Mantenga limpio y seco el apsito que cubre la [place]. No deje que se humedezca. Siga las indicaciones del mdico sobre cmo debe tomar un bao o una ducha mientras est expuesto el acceso al dispositivo.  Si no es necesario acceder al dispositivo de manera constante, no es Paramedic un apsito. QU ES EL PURGADO? El purgado ayuda a que el dispositivo no se [place]. Siga las indicaciones del mdico sobre cmo y cundo debe purgar el dispositivo. Los dispositivos de perfusin generalmente se purgan con una solucin salina o un medicamento llamado heparina. La necesidad de Corporate treasurer de cmo se use el dispositivo.   Si el dispositivo se [place] para administrar medicamentos o extraer sangre de manera intermitente, deber purgarse de la siguiente manera:  Despus de la administracin de los medicamentos.  Luego de [place] extraccin de [place].  Como parte de una rutina de Theatre manager.  Si la infusin es constante, no necesitar limpiarlo. [place]? El dispositivo puede permanecer implantado por el tiempo que el mdico considere necesario. Cuando llega el momento de retirar el dispositivo, deber someterse a [place]. El procedimiento es similar al que se realiz  para colocarlo.  [place]? Cuando tenga un dispositivo de perfusin implantado, debe buscar atencin mdica de inmediato en los siguientes casos:   Advierte un olor ftido que proviene del lugar de la incisin.  Observa hinchazn, eritemas o secrecin en el lugar de la incisin.  Observa ms hinchazn o siente ms dolor en la zona del dispositivo o a su alrededor.  Tiene fiebre que no puede controlar con [employer].   Esta informacin no tiene Marine scientist el consejo del mdico. Asegrese de hacerle al mdico cualquier pregunta que tenga.   Document Released: 05/22/2009 Document Revised: 05/15/2013 Elsevier Interactive Patient Education 2016 [place] externa (External Beam Radiation Therapy) La radioterapia externa es un tratamiento de radiacin. Se puede realizar para lo siguiente:  Lawyer. La radiacin tambin puede utilizarse para lo siguiente:  Destruir clulas cancerosas. La radiacin administrada durante el tratamiento daa las clulas cancerosas. Tambin daa las Omnicare, pero las clulas normales tienen ADN que les permite repararse a s mismas, mientras que las clulas cancerosas no.  Ayudar con los sntomas del cncer.  Detener el crecimiento de cualquier clula cancerosa remanente luego de la ciruga.  Evitar que las clulas cancerosas crezcan en reas donde no hay evidencia de cncer (radioterapia preventiva).  Tratar o encoger un tumor.  Reducir el dolor (terapia paliativa). La terapia administra dosis ms altas de radiacin [employer], las tomografas y Media planner de los estudios por imgenes. En comparacin con la radioterapia interna, la radioterapia externa puede administrar radiacin a cualquier rea bastante amplia.  La cantidad de radiacin que recibe y la duracin de la terapia dependen de su enfermedad. Durante la terapia, usted no debe sentir la administracin de la  radiacin ni dolor. RIESGOS Y COMPLICACIONES [employer] de las personas experimentan efectos secundarios de la terapia. Los efectos secundarios dependen de la cantidad de radiacin y la parte del cuerpo expuesta a la  radiacin. Por ejemplo:  La cada de cabello puede ocurrir si la radioterapia est dirigida a la cabeza.  La tos o la dificultad para tragar pueden ocurrir si la radioterapia est dirigida a la cabeza, el cuello o el pecho.  Las nuseas, los vmitos o la diarrea pueden ocurrir si la radioterapia est dirigida al abdomen o la pelvis.  Los problemas de vejiga, los deseos frecuentes de Garment/textile technologist o la disfuncin sexual pueden ocurrir si la radioterapia est dirigida a la vejiga, los riones o la prstata. Independientemente de la cantidad o la zona de la radiacin, probablemente sienta fatiga. Otros efectos secundarios son:  Enrojecimiento o descamacin de la piel en la zona afectada.  Cada de cabello en la zona afectada.  Picazn en la zona afectada. Los efectos secundarios pueden tardar entre 2 y Conservation officer, historic buildings. La mayora de los efectos secundarios son temporales y pueden controlarse. Los efectos secundarios no se detendrn inmediatamente luego de Teaching laboratory technician terapia. Puede tomarle entre 3 y Hydrographic surveyor la energa o que los efectos secundarios se [place]. El cuerpo se cura de la radiacin. ANTES DEL PROCEDIMIENTO Se realizar una sesin de planificacin (simulacin). Durante la sesin:  El mdico planificar exactamente dnde se Architectural technologist la radiacin (campo de [place]).  Se lo ubicar para la terapia. El objetivo es tener una posicin que pueda repetirse para cada sesin de terapia.  Es posible que le dibujen marcas temporarias en el cuerpo. Es posible que tambin le dibujen marcas permanentes en el cuerpo a fin de que se posicione de la misma forma en cada sesin de terapia. PROCEDIMIENTO  Usted estar recostado sobre una mesa o sentado en una  silla en la posicin determinada para la terapia.  La mquina de radiacin (acelerador lineal) se mover alrededor suyo para administrar dosis exactas desde diversos ngulos. DESPUS DEL PROCEDIMIENTO Puede retomar su rutina normal, incluidos la dieta, las actividades y [employer], a menos que su mdico le indique lo contrario.   Esta informacin no tiene Marine scientist el consejo del mdico. Asegrese de hacerle al mdico cualquier pregunta que tenga.   Document Released: 11/19/2012 Document Revised: 04/15/2015 Elsevier Interactive Patient Education 2016 [place] isotpica (MUGA Scan) La VGI (ventriculografa isotpica) toma imgenes del corazn en momentos especficos mientras late. En este estudio, se inyecta una sustancia radioactiva (marcador radioactivo) en el torrente sanguneo. La sustancia permite al mdico observar los glbulos rojos que pasan por el corazn. La VGI muestra lo siguiente:  La cantidad de sangre que bombea el corazn con cada latido.  Si los ventrculos estn funcionando bien. Los ventrculos son cmaras que se encuentran en la parte inferior del corazn. Bombean sangre a los pulmones y al resto del cuerpo. La VGI puede realizarse para determinar lo siguiente:  Las causas de los sntomas cardacos, como dolor en el pecho.  Si las arterias que irrigan el corazn estn bloqueadas.  Si tiene enfermedad de las vlvulas cardacas.  Si el corazn est daado debido a un infarto de miocardio.  Si el corazn tiene problemas para Chiropodist. INFORME A SU MDICO:  Cualquier alergia que tenga.  Todos los [employer], incluidos vitaminas, hierbas, gotas oftlmicas, cremas y medicamentos de venta libre.  Enfermedades de la sangre que tenga.  Si tiene cirugas previas.  Enfermedades que tenga.  Si est embarazada, si podra estar embarazada o si est amamantando. Esto es [employer]. RIESGOS Y  COMPLICACIONES En general, se trata de un procedimiento seguro. Sin embargo, pueden ocurrir [employer]  problemas, que incluyen los siguientes:  Reaccin alrgica al [employer] radioactivo. Este efecto secundario es poco frecuente.  Dolor y Neurosurgeon donde se coloc la va intravenosa.  Posible dao en el feto si est embarazada o en el beb si est amamantando. ANTES DEL PROCEDIMIENTO  No tome los medicamentos que toma habitualmente antes del procedimiento si el mdico le indica que no lo haga. Consulte a su mdico si debe cambiar o suspender estos medicamentos.  No ingiera bebidas que contengan cafena durante varias horas antes de realizarse [place]. Entre las bebidas que contienen cafena se incluyen el caf, el t y [place].  Pregntele al mdico si junto con la VGI deber realizar una prueba de esfuerzo.  En el caso de que tenga que realizar una prueba de esfuerzo junto con la VGI, no coma ni tome nada, excepto agua, durante las 4 horas previas al [place].  Use ropa cmoda y suelta. PROCEDIMIENTO  Se le pedir que se acueste en una camilla de exploracin.  Se le colocar una va intravenosa (IV) en una de las venas. Durante el procedimiento, se le administrarn medicamentos por una sonda.  Le colocarn discos llamados electrodos en el trax, los brazos y las piernas. Los electrodos estarn conectados con cables a Tour manager.  Se inyectar una pequea cantidad de marcador radioactivo por va intravenosa. A medida que pase por la va [place], puede sentir una sensacin fra en el brazo.  Le colocarn una cmara [employer], que tomar una serie de imgenes mientras se encuentra recostado.  La prueba de esfuerzo a la que tal vez se Designer, fashion/clothing en caminar sobre una cinta caminadora o usar una bicicleta fija. Luego, se le pedir que se vuelva a recostar sobre la camilla de exploracin para tomar ms imgenes.  Una vez que se hayan tomado todas las  imgenes, la va IV se Charity fundraiser. DESPUS DEL PROCEDIMIENTO  Beba suficiente lquido para mantener la orina clara o de color amarillo plido. Esto ayudar a Musician radioactivo del cuerpo.  Es su responsabilidad retirar el resultado del [place]. Consulte en el laboratorio o en el departamento en el que fue realizado el estudio cundo y cmo podr [employer].  Concurra a todas las visitas de control como se lo haya indicado el mdico. Esto es importante.   Esta informacin no tiene Marine scientist el consejo del mdico. Asegrese de hacerle al mdico cualquier pregunta que tenga.   Document Released: 05/15/2013 Document Revised: 08/15/2014 Elsevier Interactive Patient Education 2016 [place] (Echocardiogram) El ecocardiograma utiliza ondas sonoras (ultrasonido) para obtener una imagen del corazn. El ecocardiograma es simple e indoloro, se obtiene en un perodo de tiempo breve y proporciona informacin valiosa a su mdico. Las imgenes de un ecocardiograma pueden proporcionar el siguiente tipo de informacin:  Evidencia de enfermedad arterial coronaria (EAC).  Tamao del corazn.  Funcin del msculo cardaco.  Funcin de la vlvula cardaca.  Deteccin de aneurisma.  Evidencia de un infarto de miocardio pasado.  Acumulacin de lquido alrededor del corazn.  Engrosamiento del msculo cardaco.  Evaluacin de la funcin de la vlvula cardaca. INFORME A SU MDICO:  Cualquier alergia que tenga.  Todos los [employer], incluidos vitaminas, hierbas, gotas oftlmicas, cremas y medicamentos de venta libre.  Problemas previos que usted o los [employer] de su familia hayan tenido con el uso de anestsicos.  Enfermedades de la sangre que tenga.  Cirugas previas.  Enfermedades que tenga.  Posibilidad de  embarazo, si corresponde. ANTES DEL PROCEDIMIENTO  No se requiere una preparacin especial. Coma y beba con  normalidad.  PROCEDIMIENTO   Para lograr una imagen del corazn, se aplicar un gel en el pecho y luego se pasar un instrumento similar a una vara (transductor) sobre este. El gel ayudar a transmitir las [employer] del transductor. Las ondas sonoras rebotarn inofensivamente en el corazn para permitir capturar las imgenes del corazn en movimiento, en tiempo real. Luego, las imgenes se Clinical cytogeneticist.  Quiz necesite una va IV para recibir un medicamento que mejore la calidad de las imgenes. DESPUS DEL PROCEDIMIENTO Puede retomar su rutina normal, incluidos la dieta, las actividades y [employer], a menos que su mdico le indique lo contrario.   Esta informacin no tiene Marine scientist el consejo del mdico. Asegrese de hacerle al mdico cualquier pregunta que tenga.   Document Released: 07/25/2005 Document Revised: 08/15/2014 Elsevier Interactive Patient Education [employer].

## 2015-10-22 NOTE — Assessment & Plan Note (Addendum)
Left-sided breast cancer, high-grade, measuring 1.6 cm in largest dimension with biopsy-proven metastatic disease to left axillary lymph node(s), ER/PR negative, HER-2 negative with a high KI-67 Marker of 85%.  Patient is Hispanic, non-English-speaking, and an undocumented citizen.  She is provided education regarding her breast cancer and future treatment. She knows that she will have upcoming definitive surgical intervention by Dr. Arnoldo Morale, followed by systemic chemotherapy, followed by XRT.   We will perform laboratory work today: CBC and differential, and platelet metabolic panel  We will stage her completely with CT scans of chest, abdomen, and pelvis with contrast. She will also undergo bone scan. If negative, she will proceed as planned.  We will consult CSW. Message will be sent.  We will consult chaplaincy services for assistance moving forward.   Given the patient's lack of citizenship, we will be preemptive and message radiation oncologist so there prepared for future treatment.   We'll refer the patient to genetic counselor in the near future. This can be done following surgery.  She would benefit from [place] getting her in contact with a similar patient who is completing therapy.  We will try to arrange this accordingly.  She will need a 2-D echo following surgery in preparation for systemic chemotherapy. Order is placed and this will be scheduled. Next  She'll return in 2-3 weeks for follow-up appointment and future planning of systemic chemotherapy. She will be set up with our nurse navigator for chemotherapy teaching. We will have Dr. Arnoldo Morale place a Port-A-Cath for chemotherapy.  She will consider reconstruction in the future. She confirms that she is not interested in having more children.

## 2015-10-23 ENCOUNTER — Other Ambulatory Visit (HOSPITAL_COMMUNITY): Payer: Self-pay | Admitting: Oncology

## 2015-10-23 DIAGNOSIS — C50112 Malignant neoplasm of central portion of left female breast: Secondary | ICD-10-CM

## 2015-10-26 ENCOUNTER — Encounter (HOSPITAL_COMMUNITY): Payer: Self-pay

## 2015-10-26 ENCOUNTER — Encounter (HOSPITAL_COMMUNITY)
Admission: RE | Admit: 2015-10-26 | Discharge: 2015-10-26 | Disposition: A | Discharge status: Home / Self Care | Payer: Self-pay | Source: Ambulatory Visit | Attending: Oncology | Admitting: Oncology

## 2015-10-26 DIAGNOSIS — C50112 Malignant neoplasm of central portion of left female breast: Secondary | ICD-10-CM | POA: Insufficient documentation

## 2015-10-26 LAB — IRON AND TIBC
Iron: 23 ug/dL — ABNORMAL LOW (ref 28–170)
Saturation Ratios: 5 % — ABNORMAL LOW (ref 10.4–31.8)
TIBC: 505 ug/dL — ABNORMAL HIGH (ref 250–450)
UIBC: 482 ug/dL

## 2015-10-26 LAB — FERRITIN: Ferritin: 4 ng/mL — ABNORMAL LOW (ref 11–307)

## 2015-10-26 MED ORDER — TECHNETIUM TC 99M MEDRONATE IV KIT
25.0000 | PACK | Freq: Once | INTRAVENOUS | Status: AC | PRN
  Administered 2015-10-26: 25 via INTRAVENOUS

## 2015-10-27 ENCOUNTER — Encounter (HOSPITAL_COMMUNITY): Payer: Self-pay | Admitting: Oncology

## 2015-10-27 ENCOUNTER — Other Ambulatory Visit (HOSPITAL_COMMUNITY): Payer: Self-pay | Admitting: Oncology

## 2015-10-27 ENCOUNTER — Ambulatory Visit (HOSPITAL_COMMUNITY)
Admission: RE | Admit: 2015-10-27 | Discharge: 2015-10-27 | Disposition: A | Discharge status: Home / Self Care | Payer: Self-pay | Source: Ambulatory Visit | Attending: Oncology | Admitting: Oncology

## 2015-10-27 DIAGNOSIS — D509 Iron deficiency anemia, unspecified: Secondary | ICD-10-CM | POA: Insufficient documentation

## 2015-10-27 DIAGNOSIS — Z3200 Encounter for pregnancy test, result unknown: Secondary | ICD-10-CM | POA: Insufficient documentation

## 2015-10-27 DIAGNOSIS — R938 Abnormal findings on diagnostic imaging of other specified body structures: Secondary | ICD-10-CM | POA: Insufficient documentation

## 2015-10-27 DIAGNOSIS — C50112 Malignant neoplasm of central portion of left female breast: Secondary | ICD-10-CM | POA: Insufficient documentation

## 2015-10-27 DIAGNOSIS — K4020 Bilateral inguinal hernia, without obstruction or gangrene, not specified as recurrent: Secondary | ICD-10-CM | POA: Insufficient documentation

## 2015-10-27 DIAGNOSIS — R590 Localized enlarged lymph nodes: Secondary | ICD-10-CM | POA: Insufficient documentation

## 2015-10-27 LAB — PREGNANCY, URINE: Preg Test, Ur: NEGATIVE

## 2015-10-27 MED ORDER — IOHEXOL 300 MG/ML  SOLN
100.0000 mL | Freq: Once | INTRAMUSCULAR | Status: AC | PRN
  Administered 2015-10-27: 100 mL via INTRAVENOUS

## 2015-10-29 ENCOUNTER — Other Ambulatory Visit: Payer: Self-pay

## 2015-10-29 ENCOUNTER — Encounter: Payer: Self-pay | Admitting: Genetic Counselor

## 2015-10-29 ENCOUNTER — Ambulatory Visit (HOSPITAL_BASED_OUTPATIENT_CLINIC_OR_DEPARTMENT_OTHER): Payer: Self-pay | Admitting: Genetic Counselor

## 2015-10-29 DIAGNOSIS — Z8049 Family history of malignant neoplasm of other genital organs: Secondary | ICD-10-CM | POA: Insufficient documentation

## 2015-10-29 DIAGNOSIS — C50112 Malignant neoplasm of central portion of left female breast: Secondary | ICD-10-CM

## 2015-10-29 DIAGNOSIS — C50919 Malignant neoplasm of unspecified site of unspecified female breast: Secondary | ICD-10-CM

## 2015-10-29 DIAGNOSIS — Z803 Family history of malignant neoplasm of breast: Secondary | ICD-10-CM

## 2015-10-29 DIAGNOSIS — Z315 Encounter for genetic counseling: Secondary | ICD-10-CM

## 2015-10-29 NOTE — Progress Notes (Signed)
REFERRING PROVIDER: Ancil Linsey, MD  PRIMARY PROVIDER:  [name], PA-C  PRIMARY REASON FOR VISIT:  1. Malignant neoplasm of central portion of left female breast ([place])   2. 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 Family history of uterine cancer      HISTORY OF PRESENT ILLNESS:   Michele Salas, a 35 y.o. female, was seen for a [place] cancer genetics consultation at the request of Dr. Whitney Muse due to a personal history of triple negative breast cancer at age 4.  Michele Salas presents to clinic today with her niece to discuss the possibility of a hereditary predisposition to cancer, genetic testing, and to further clarify her future cancer risks, as well as potential cancer risks for family members. Michele Salas primary language is Spanish, so an interpreter is with her and her niece today.  In February 2017, at the age of 15, Michele Salas was diagnosed with invasive ductal carcinoma of the left breast.  Hormone receptor status was triple negative. This will likely be treated with mastectomy, but genetic test results will help inform surgical/treatment decisons.    CANCER HISTORY:    Breast cancer, left ([place])   09/16/2015 Mammogram 1.6 cm retroareolar left breast mass measuring 1.6 cm in largest diameter in addition to 3 abnormal appearing left inferior axillary lymph nodes.   09/23/2015 Initial Biopsy US guided core biopsy of left breast mass and suspicious left axillary lymph nodes.   09/23/2015 Pathology Results Invasive ductal carcinoma, high grade.  Left axilla lymph node is positive for metastatic carcinoma.  ER/PR NEGATIVE, HER2 NEGATIVE, KI-67 marker of 85%.   10/26/2015 Imaging Bone scan- No evidence of metastatic disease to the skeleton.   10/27/2015 Imaging CT CAP-  Retroareolar mass with 2 asymmetrically prominent left axillary lymph nodes and an upper normal size left subpectoral lymph node. No other findings of metastatic disease in the chest,  abdomen, or pelvis.     HORMONAL RISK FACTORS:  Menarche was at age 89.  First live birth at age 80.  OCP use for approximately not sure how long she took birth control for, but reports that she has not taken in 48 years.  Ovaries intact: yes.  Hysterectomy: no.  Menopausal status: premenopausal, but periods are irregular. HRT use: 0 years. Colonoscopy: n/a; not examined. Mammogram within the last year: this was her first mammogram. Number of breast biopsies: 1. Up to date with pelvic exams:  yes. Any excessive radiation exposure in the past:  no  Past Medical History  Diagnosis Date  . GERD (gastroesophageal reflux disease)   . Breast cancer, left ([place]) 10/22/2015  . Iron deficiency anemia 10/27/2015    History reviewed. No pertinent past surgical history.  Social History   Social History  . Marital Status: Married    Spouse Name: N/A  . Number of Children: N/A  . Years of Education: N/A   Social History Main Topics  . Smoking status: Passive Smoke Exposure - Never Smoker  . Smokeless tobacco: Never Used  . Alcohol Use: No  . Drug Use: No  . Sexual Activity: Yes   Other Topics Concern  . None   Social History Narrative     FAMILY HISTORY:  We obtained a detailed, 4-generation family history.  Significant diagnoses are listed below: Family History  Problem Relation Age of Onset  . Diabetes Mother   . Uterine cancer Paternal Aunt 75  . Heart attack Paternal Uncle     unspecified age  .  Heart attack Paternal Grandfather 59  . Other Paternal Uncle     stomach swollen with fluid that had to be removed; lim info    Michele Salas reports no history of cancer in the family other than a history of uterine cancer for an aunt.   She has two daughters, ages 40 and 57, and one son who is currently 59.  She has one full brother who is 49.  He has two daughters and one son of his own.  One of his daughters had a tumor removed from her knee at an unspecified age.  Ms.  Salas mother is currently in her 98s.  Her father passed away in his early 27s; he was drinking and fell into a river and drown.    Michele Salas mother has six full sisters and five full brothers--two of these brothers have passed away from unspecified causes.  Michele Salas has some limited information for these relatives, but reports that they have never had cancer and neither have their children.  Michele Salas maternal grandmother is in her 32s-80s.  Her grandfather died at an older age.  She reports no known cancer history for any great aunts and uncles.  Michele Salas father has one full sister and eight full brothers.  His sister was diagnosed with uterine cancer at age 75.  She has no children.  Two of his brothers have passed away--one died of a heart attack and one had issues with a swollen stomach that required fluid removal, both at unspecified ages.  There is no known cancer for these relatives or for their children.  Michele Salas never met her paternal grandmother.  Her grandfather died of a heart attack at age 73.      Patient's maternal  and paternal ancestors are of [place] descent. There is no reported Ashkenazi Jewish ancestry. There is no known consanguinity.  GENETIC COUNSELING ASSESSMENT: Michele Salas is a 35 y.o. female with a personal history of early-onset triple negative breast cancer which is somewhat suggestive of a hereditary breast cancer syndrome and predisposition to cancer. We, therefore, discussed and recommended the following at today's visit.   DISCUSSION: We reviewed the characteristics, features and inheritance patterns of hereditary cancer syndromes, particularly those caused by mutations in the BRCA1/2 genes. We also discussed genetic testing, including the appropriate family members to test, the process of testing, insurance coverage and turn-around-time for results. We discussed the implications of a negative, positive and/or variant of uncertain  significant result. We recommended Michele Salas pursue genetic testing for the 28-gene [facility] Hereditary Cancer Panel through [facility] in [place], [place].  The [facility] gene panel offered by [facility] includes sequencing and/or deletion/duplication testing of the following 28 genes: APC, ATM, BARD1, BMPR1A, BRCA1, BRCA2, BRIP1, CHD1, CDK4, CDKN2A, CHEK2, EPCAM (large rearrangement only), GREM1, MLH1, MSH2, MSH6, MUTYH, NBN, PALB2, PMS2, POLD1, POLE, PTEN, RAD51C, RAD51D, SMAD4, STK11, and TP53.    Based on Michele Salas's personal and family history of cancer, she meets medical criteria for genetic testing. She also meets [facility]'s criteria for their financial assistance program for patients without insurance.  She will fax a copy of her tax form to me, so I can send this along to [facility].  She should not have an out-of-pocket cost for this test.  If she has any issues she knows to call me.  The lab will call her if they need any additional information from her.  PLAN: After considering the risks, benefits,  and limitations, Michele Salas  provided informed consent to pursue genetic testing and the blood sample was sent to [facility] for analysis of the 28-gene [facility] Hereditary Cancer Panel. Results should be available within approximately 2-3 weeks' time, at which point they will be disclosed by telephone to Michele Salas, as will any additional recommendations warranted by these results. Michele Salas will receive a summary of her genetic counseling visit and a copy of her results once available. This information will also be available in Epic. We encouraged Michele Salas to remain in contact with cancer genetics annually so that we can continuously update the family history and inform her of any changes in cancer genetics and testing that may be of benefit for her family. Ms. Socarras questions were answered to her satisfaction today. Our contact  information was provided should additional questions or concerns arise.  Thank you for the referral and allowing [place] to share in the care of your patient.   Jeanine Luz, MS, [facility] Certified Genetic Counselor [email or web address] Phone: [phone]  The patient was seen for a total of 60 minutes in face-to-face genetic counseling.  This patient was discussed with Drs. Magrinat, Lindi Adie and/or Burr Medico who agrees with the above.    _______________________________________________________________________ For Office Staff:  Number of people involved in session: 3 Was an Intern/ student involved with case: no

## 2015-11-02 ENCOUNTER — Encounter (HOSPITAL_BASED_OUTPATIENT_CLINIC_OR_DEPARTMENT_OTHER): Payer: Self-pay

## 2015-11-02 VITALS — BP 125/67 | HR 80 | Temp 98.3°F | Resp 16

## 2015-11-02 DIAGNOSIS — D509 Iron deficiency anemia, unspecified: Secondary | ICD-10-CM

## 2015-11-02 MED ORDER — SODIUM CHLORIDE 0.9 % IV SOLN
510.0000 mg | Freq: Once | INTRAVENOUS | Status: AC
  Administered 2015-11-02: 510 mg via INTRAVENOUS
  Filled 2015-11-02: qty 17

## 2015-11-02 MED ORDER — SODIUM CHLORIDE 0.9 % IV SOLN
INTRAVENOUS | Status: DC
  Administered 2015-11-02: 15:00:00 via INTRAVENOUS

## 2015-11-02 MED ORDER — SODIUM CHLORIDE 0.9% FLUSH
10.0000 mL | Freq: Once | INTRAVENOUS | Status: AC
  Administered 2015-11-02: 10 mL via INTRAVENOUS

## 2015-11-02 NOTE — Progress Notes (Signed)
Tolerated feraheme infusion well. Ambulatory on discharge home with interpreter.

## 2015-11-02 NOTE — Patient Instructions (Signed)
[place] at [facility] Discharge Instructions  RECOMMENDATIONS MADE BY THE CONSULTANT AND ANY TEST RESULTS WILL BE SENT TO YOUR REFERRING PHYSICIAN.  Ferahem 510 mg iron infusion given today as ordered. Return as scheduled.  Thank you for choosing [place] at [facility] to provide your oncology and hematology care.  To afford each patient quality time with our provider, please arrive at least 15 minutes before your scheduled appointment time.   Beginning January 23rd 2017 lab work for the [facility] will be done in the  Main lab at [facility] on 1st floor. If you have a lab appointment with the [facility] please come in thru the  Main Entrance and check in at the main information desk  You need to re-schedule your appointment should you arrive 10 or more minutes late.  We strive to give you quality time with our providers, and arriving late affects you and other patients whose appointments are after yours.  Also, if you no show three or more times for appointments you may be dismissed from the clinic at the providers discretion.     Again, thank you for choosing [facility].  Our hope is that these requests will decrease the amount of time that you wait before being seen by our physicians.       _____________________________________________________________  Should you have questions after your visit to [facility], please contact our office at [phone] between the hours of 8:30 a.m. and 4:30 p.m.  Voicemails left after 4:30 p.m. will not be returned until the following business day.  For prescription refill requests, have your pharmacy contact our office.         Resources For Cancer Patients and their Caregivers ? American Cancer Society: Can assist with transportation, wigs, general needs, runs Look Good Feel Better.        201-385-9739 ? Cancer Care: Provides financial assistance, online  support groups, medication/co-pay assistance.  1-800-813-HOPE 954-460-2701) ? [place] Assists [place] cancer patients and their families through emotional , educational and financial support.  548 266 3348 ? [place] DSS Where to apply for food stamps, Medicaid and utility assistance. (431)317-4916 ? [facility]: Transportation to medical appointments. 323-387-7359 ? Social Security Administration: May apply for disability if have a Stage IV cancer. 807-208-9628 423-801-9058 ? [employer], Disability and Transit Services: Assists with nutrition, care and transit needs. 575-083-6163

## 2015-11-05 ENCOUNTER — Ambulatory Visit (HOSPITAL_COMMUNITY)
Admission: RE | Admit: 2015-11-05 | Discharge: 2015-11-05 | Disposition: A | Discharge status: Home / Self Care | Payer: Self-pay | Source: Ambulatory Visit | Attending: Oncology | Admitting: Oncology

## 2015-11-05 DIAGNOSIS — C50112 Malignant neoplasm of central portion of left female breast: Secondary | ICD-10-CM | POA: Insufficient documentation

## 2015-11-06 ENCOUNTER — Encounter (HOSPITAL_COMMUNITY): Payer: Self-pay | Admitting: Oncology

## 2015-11-09 ENCOUNTER — Encounter (HOSPITAL_COMMUNITY): Payer: Self-pay | Admitting: Hematology & Oncology

## 2015-11-09 ENCOUNTER — Encounter (HOSPITAL_COMMUNITY): Payer: Self-pay | Admitting: Lab

## 2015-11-09 ENCOUNTER — Encounter (HOSPITAL_COMMUNITY): Payer: Self-pay | Attending: Hematology & Oncology | Admitting: Hematology & Oncology

## 2015-11-09 VITALS — BP 133/71 | HR 85 | Temp 98.3°F | Resp 18 | Wt 140.7 lb

## 2015-11-09 DIAGNOSIS — C50112 Malignant neoplasm of central portion of left female breast: Secondary | ICD-10-CM

## 2015-11-09 DIAGNOSIS — Z171 Estrogen receptor negative status [ER-]: Secondary | ICD-10-CM

## 2015-11-09 DIAGNOSIS — D509 Iron deficiency anemia, unspecified: Secondary | ICD-10-CM

## 2015-11-09 DIAGNOSIS — C773 Secondary and unspecified malignant neoplasm of axilla and upper limb lymph nodes: Secondary | ICD-10-CM

## 2015-11-09 DIAGNOSIS — C50912 Malignant neoplasm of unspecified site of left female breast: Secondary | ICD-10-CM | POA: Insufficient documentation

## 2015-11-09 DIAGNOSIS — Z79899 Other long term (current) drug therapy: Secondary | ICD-10-CM | POA: Insufficient documentation

## 2015-11-09 DIAGNOSIS — N644 Mastodynia: Secondary | ICD-10-CM | POA: Insufficient documentation

## 2015-11-09 DIAGNOSIS — K219 Gastro-esophageal reflux disease without esophagitis: Secondary | ICD-10-CM | POA: Insufficient documentation

## 2015-11-09 DIAGNOSIS — R1013 Epigastric pain: Secondary | ICD-10-CM | POA: Insufficient documentation

## 2015-11-09 DIAGNOSIS — R51 Headache: Secondary | ICD-10-CM | POA: Insufficient documentation

## 2015-11-09 NOTE — Assessment & Plan Note (Signed)
Left-sided breast cancer, high-grade, measuring 1.6 cm in largest dimension with biopsy-proven metastatic disease to left axillary lymph node(s), ER/PR negative, HER-2 negative with a high KI-67 Marker of 85%.  CT imaging and bone scan were reviewed. She has seen genetics.  Results from genetics testing should be available later this week. Mood is improved and we have put her in contact with another hispanic patient who has just completed all therapy including reconstruction; she notes this has helped tremendously.   We will refer her back to Dr. Arnoldo Morale for surgery and port placement.  She will need chemotherapy teaching. ECHO is completed and reviewed. Plan is for [facility] X 4 given in a dose dense fashion followed by weekly taxol.

## 2015-11-09 NOTE — Progress Notes (Signed)
Referral sent to Dr Arnoldo Morale. appt 4/20

## 2015-11-09 NOTE — Patient Instructions (Addendum)
[place] at [facility] Discharge Instructions  RECOMMENDATIONS MADE BY THE CONSULTANT AND ANY TEST RESULTS WILL BE SENT TO YOUR REFERRING PHYSICIAN.   Exam and discussion by Dr Whitney Muse today The goal is to cure the cancer. There is a beginning and an end. You do not need replacement iron by mouth since we gave uyou iron through your IV. Referral back to Dr Arnoldo Morale  Lupita Raider (she is our nurse navigator) will do your teaching she will call you with this appt.  If you have any questions she has a voicemail if you cant reach her.  Her number is [phone] Hildred Alamin will make you a calender out that will let you know when you come in for all your appointments Please call the clinic if you have any questions or concerns Return to see the doctor about 1 week after you have your surgery with Dr Raynelle Fanning y discusin del Dr. Estill Bamberg El objetivo es curar el cncer. Hay un principio y un fin. Usted no necesita hierro del reemplazo por la boca puesto que le dimos el hierro del uyou con su IV. Referencia de nuevo al Dr. Levy Pupa (ella es nuestra enfermera navegador) har su enseanza que le llamar con este appt. Si tiene [place] pregunta, tiene un buzn de voz si no puede comunicarse con ella. Su nmero es [phone] Hildred Alamin te har un calendario que te avisar cuando vengas a todas tus citas [place] a la clnica si tiene alguna pregunta o inquietud       Thank you for choosing [place] at [facility] to provide your oncology and hematology care.  To afford each patient quality time with our provider, please arrive at least 15 minutes before your scheduled appointment time.   Beginning January 23rd 2017 lab work for the [facility] will be done in the  Main lab at [facility] on 1st floor. If you have a lab appointment with the [facility] please come in thru the  Main Entrance and check in at the main information  desk  You need to re-schedule your appointment should you arrive 10 or more minutes late.  We strive to give you quality time with our providers, and arriving late affects you and other patients whose appointments are after yours.  Also, if you no show three or more times for appointments you may be dismissed from the clinic at the providers discretion.     Again, thank you for choosing [facility].  Our hope is that these requests will decrease the amount of time that you wait before being seen by our physicians.       _____________________________________________________________  Should you have questions after your visit to [facility], please contact our office at [phone] between the hours of 8:30 a.m. and 4:30 p.m.  Voicemails left after 4:30 p.m. will not be returned until the following business day.  For prescription refill requests, have your pharmacy contact our office.         Resources For Cancer Patients and their Caregivers ? American Cancer Society: Can assist with transportation, wigs, general needs, runs Look Good Feel Better.        979 512 6217 ? Cancer Care: Provides financial assistance, online support groups, medication/co-pay assistance.  1-800-813-HOPE (319)134-8352) ? [place] Assists [place] cancer patients and their families through emotional , educational and financial support.  216-567-0705 ?  [place] DSS Where to apply for food stamps, Medicaid and utility assistance. (202)531-9374 ? [facility]: Transportation to medical appointments. 430-579-8024 ? Social Security Administration: May apply for disability if have a Stage IV cancer. 351 212 0434 (709)297-2870 ? [employer], Disability and Transit Services: Assists with nutrition, care and transit needs. 701-267-5014

## 2015-11-09 NOTE — Assessment & Plan Note (Signed)
She did well with IV iron replacement. She was severely iron deficiency and iron stores were replaced in preparation for chemotherapy. Iron levels will be reassessed at her next follow-up

## 2015-11-09 NOTE — Progress Notes (Signed)
[facility] Hematology/Oncology Consultation   Name: Michele Salas      MRN: [id number]    Date: 11/09/2015 Time:10:53 AM   REFERRING PHYSICIAN:  Aviva Signs, MD (Gen Surg)  REASON FOR CONSULT:  Left sided breast cancer   DIAGNOSIS:  Invasive ductal carcinoma, high grade, of left breast.  HISTORY OF PRESENT ILLNESS:   Michele Salas is a 35 year old Hispanic woman with a past medical history significant for headaches who is referred to the [facility] for a new diagnosis of left breast cancer.    Breast cancer, left ([place])   09/16/2015 Mammogram 1.6 cm retroareolar left breast mass measuring 1.6 cm in largest diameter in addition to 3 abnormal appearing left inferior axillary lymph nodes.   09/23/2015 Initial Biopsy US guided core biopsy of left breast mass and suspicious left axillary lymph nodes.   09/23/2015 Pathology Results Invasive ductal carcinoma, high grade.  Left axilla lymph node is positive for metastatic carcinoma.  ER/PR NEGATIVE, HER2 NEGATIVE, KI-67 marker of 85%.   10/26/2015 Imaging Bone scan- No evidence of metastatic disease to the skeleton.   10/27/2015 Imaging CT CAP-  Retroareolar mass with 2 asymmetrically prominent left axillary lymph nodes and an upper normal size left subpectoral lymph node. No other findings of metastatic disease in the chest, abdomen, or pelvis.   11/05/2015 Echocardiogram The estimated ejection fraction was in the range of 60% to 65%.    Michele Salas is accompanied by her husband and a Optometrist. Our visit was facilitated by a translator today. I personally reviewed and went over recent imaging results with the patient.   Her genetics counselor told her the results would take about 2 weeks to return.   Her mood is good. Her husband agrees that her mood has improved, though she is sleeping a lot. She has spoken with Chrissie Noa which helped a lot.   She is curious whether she will need to take oral  iron as she did previously.   The patient returns for further evaluation and discussion of future treatment options.   Review of Systems  Constitutional: Negative for fever, chills, weight loss, malaise/fatigue and diaphoresis.  HENT: Negative for congestion and sore throat.   Eyes: Negative for blurred vision, double vision and redness.  Respiratory: Negative for cough, hemoptysis, sputum production, shortness of breath and wheezing.   Cardiovascular: Negative for chest pain, palpitations and leg swelling.  Gastrointestinal: Positive for abdominal pain. Negative for heartburn, nausea, vomiting, diarrhea, constipation, blood in stool and melena.       Epigastric pain with eating greasy/fatty foods  Genitourinary: Negative for dysuria, frequency and hematuria.  Musculoskeletal: Negative for myalgias, back pain, joint pain, falls and neck pain.  Skin: Negative for itching and rash.  Neurological: Negative for dizziness, tingling, tremors, sensory change, speech change, focal weakness and weakness.  Endo/Heme/Allergies: Negative.   Psychiatric/Behavioral: Negative.    HORMONAL RISK FACTORS:  Menarche was at age 65.  First live birth at age 50.  OCP use for approximately not sure how long she took birth control for, but reports that she has not taken in 54 years.  Ovaries intact: yes.  Hysterectomy: no.  Menopausal status: premenopausal, but periods are irregular. HRT use: 0 years. Colonoscopy: n/a; not examined. Mammogram within the last year: this was her first mammogram. Number of breast biopsies: 1. Up to date with pelvic exams: yes. Any excessive radiation exposure in the past: no  PAST MEDICAL HISTORY:   Past Medical History  Diagnosis Date  . GERD (gastroesophageal reflux disease)   . Breast cancer, left ([place]) 10/22/2015  . Iron deficiency anemia 10/27/2015    ALLERGIES: No Known Allergies    MEDICATIONS: I have reviewed the patient's current medications.     Current Outpatient Prescriptions on File Prior to Visit  Medication Sig Dispense Refill  . propranolol (INDERAL) 20 MG tablet 1 po bid. Tome una tableta por [employer] veces diarias (Patient taking differently: 1 po bid. Tome una tableta por boca dos veces diarias   Pt is taking as needed) 60 tablet 3   No current facility-administered medications on file prior to visit.     PAST SURGICAL HISTORY History reviewed. No pertinent past surgical history.  FAMILY HISTORY: Family History  Problem Relation Age of Onset  . Diabetes Mother   . Uterine cancer Paternal Aunt 28  . Heart attack Paternal Uncle     unspecified age  . Heart attack Paternal Grandfather 11  . Other Paternal Uncle     stomach swollen with fluid that had to be removed; lim info  Mother is alive at the age of 49. She has hypertension and diabetes. She lives in [place]. Father is deceased at unknown age and for unknown reason. She reports "he was old." Brothers 70 years old and healthy. She has many half siblings, many of whom live in [place]. She has 3 children. She is a 68 year old daughter who lives in [place], 69 year old son who lives here in [place], and 17 year old daughter who lives in [place]. All children are healthy.  SOCIAL HISTORY: Patient denies any tobacco abuse. Rarely does she have alcoholic beverage. She denies any illicit drug abuse. 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 She is religious and Catholic. She is not a documented citizen of the [place]. She is married.  PERFORMANCE STATUS: The patient's performance status is 0 - Asymptomatic  PHYSICAL EXAM: Most Recent Vital Signs: Blood pressure 133/71, pulse 85, temperature 98.3 F (36.8 C), temperature source Oral, resp. rate 18, weight 140 lb 11.2 oz (63.821 kg), last menstrual period 09/25/2015, SpO2 100 %. General appearance: alert, cooperative, appears stated age, no distress and Hispanic, non-english  speaking, accompanied by a translator and her husband Head: Normocephalic, without obvious abnormality, atraumatic Eyes: negative findings: lids and lashes normal, conjunctivae and sclerae normal and corneas clear Throat: lips, mucosa, and tongue normal; teeth and gums normal Neck: no adenopathy, supple, symmetrical, trachea midline and thyroid not enlarged, symmetric, no tenderness/mass/nodules Lungs: clear to auscultation bilaterally and normal percussion bilaterally Breasts: positive findings: nipple discharge on the left and 2.0 cm, irregular, firm, fixed and non-tender nodule located on the left deep to areola favoring upper-outer quadrant Heart: regular rate and rhythm, S1, S2 normal, no murmur, click, rub or gallop Abdomen: soft, non-tender; bowel sounds normal; no masses,  no organomegaly Extremities: extremities normal, atraumatic, no cyanosis or edema Skin: Skin color, texture, turgor normal. No rashes or lesions Lymph nodes: Axillary adenopathy: left anterior line adenopathy Neurologic: Alert and oriented X 3, normal strength and tone. Normal symmetric reflexes. Normal coordination and gait  LABORATORY DATA:  Results for KRISSIE, MERRICK (MRN [id number]) as of 11/09/2015 14:35  Ref. Range 10/26/2015 13:03  Iron Latest Ref Range: 28-170 ug/dL 23 (L)  UIBC Latest Units: ug/dL 482  TIBC Latest Ref Range: 250-450 ug/dL 505 (H)  Saturation Ratios Latest Ref Range: 10.4-31.8 % 5 (L)  Ferritin Latest  Ref Range: 11-307 ng/mL 4 (L)   Results for CLYTIE, SHETLEY (MRN [id number]) as of 11/09/2015 14:35  Ref. Range 10/22/2015 14:52  Sodium Latest Ref Range: 135-145 mmol/L 138  Potassium Latest Ref Range: 3.5-5.1 mmol/L 3.7  Chloride Latest Ref Range: 101-111 mmol/L 106  CO2 Latest Ref Range: 22-32 mmol/L 24  BUN Latest Ref Range: 6-20 mg/dL 12  Creatinine Latest Ref Range: 0.44-1.00 mg/dL 0.52  Calcium Latest Ref Range: 8.9-10.3 mg/dL 9.1  EGFR (Non-African Amer.) Latest Ref Range: >60  mL/min >60  EGFR (African American) Latest Ref Range: >60 mL/min >60  Glucose Latest Ref Range: 65-99 mg/dL 96  Anion gap Latest Ref Range: 5-15  8  Alkaline Phosphatase Latest Ref Range: 38-126 U/L 86  Albumin Latest Ref Range: 3.5-5.0 g/dL 4.5  AST Latest Ref Range: 15-41 U/L 19  ALT Latest Ref Range: 14-54 U/L 23  Total Protein Latest Ref Range: 6.5-8.1 g/dL 8.0  Total Bilirubin Latest Ref Range: 0.3-1.2 mg/dL 0.7  WBC Latest Ref Range: 4.0-10.5 K/uL 8.8  RBC Latest Ref Range: 3.87-5.11 MIL/uL 4.75  Hemoglobin Latest Ref Range: 12.0-15.0 g/dL 11.3 (L)  HCT Latest Ref Range: 36.0-46.0 % 35.4 (L)  MCV Latest Ref Range: 78.0-100.0 fL 74.5 (L)  MCH Latest Ref Range: 26.0-34.0 pg 23.8 (L)  MCHC Latest Ref Range: 30.0-36.0 g/dL 31.9  RDW Latest Ref Range: 11.5-15.5 % 15.4  Platelets Latest Ref Range: 150-400 K/uL 394  Neutrophils Latest Units: % 67  Lymphocytes Latest Units: % 26  Monocytes Relative Latest Units: % 6  Eosinophil Latest Units: % 1  Basophil Latest Units: % 0  NEUT# Latest Ref Range: 1.7-7.7 K/uL 5.8  Lymphocyte # Latest Ref Range: 0.7-4.0 K/uL 2.3  Monocyte # Latest Ref Range: 0.1-1.0 K/uL 0.6  Eosinophils Absolute Latest Ref Range: 0.0-0.7 K/uL 0.1  Basophils Absolute Latest Ref Range: 0.0-0.1 K/uL 0.0    RADIOGRAPHY: I have personally reviewed the radiological images as listed and agreed with the findings in the report.  Study Result     CLINICAL DATA: Left breast cancer staging workup.  EXAM: CT CHEST, ABDOMEN, AND PELVIS WITH CONTRAST  TECHNIQUE: Multidetector CT imaging of the chest, abdomen and pelvis was performed following the standard protocol during bolus administration of intravenous contrast.  CONTRAST: 156m OMNIPAQUE IOHEXOL 300 MG/ML SOLN  COMPARISON: 10/26/2015  FINDINGS: CT CHEST FINDINGS  Mediastinum/Nodes: No mediastinal or hilar adenopathy. No internal mammary adenopathy. A left axillary lymph node measures 1.3 cm  in short axis on image 16/2. A second left axillary lymph node measures 1.1 cm in short axis on image 21/2. A left subpectoral lymph node measures 0.8 cm in short axis, image 12/2.  Lungs/Pleura: 5 mm densely calcified nodule in the right upper lobe has a faint margin of soft tissue density vague given the dense calcification and the presence of a small calcified prevascular node on the right, I favor this is being a benign calcified granuloma. The lungs appear otherwise clear.  Musculoskeletal: No bony abnormality. Left breast retroareolar mass approximately 2 cm diameter, image 28 series 2.  CT ABDOMEN PELVIS FINDINGS  Hepatobiliary: Mildly contracted gallbladder. Otherwise unremarkable.  Pancreas: Unremarkable  Spleen: Unremarkable  Adrenals/Urinary Tract: Unremarkable  Stomach/Bowel: Unremarkable  Vascular/Lymphatic: Unremarkable  Reproductive: Indistinct endometrium up to 1.6 cm in thickness. Adnexa unremarkable.  Other: No supplemental non-categorized findings.  Musculoskeletal: Small bilateral direct inguinal hernias containing adipose tissue. No findings of bony metastatic disease.  IMPRESSION: 1. Retroareolar mass with 2 asymmetrically prominent left axillary lymph nodes and  an upper normal size left subpectoral lymph node. No other findings of metastatic disease in the chest, abdomen, or pelvis. 2. 5 mm fairly densely calcified nodule in the right upper lobe, favoring benign calcified granuloma. 3. Borderline endometrial thickening, although this may be normal in the secretory phase. 4. Incidental small bilateral direct inguinal hernias containing adipose tissue.   Electronically Signed  By: Van Clines M.D.  On: 10/27/2015 18:14   Study Result     CLINICAL DATA: New diagnosis of left-sided breast malignancy, evaluate for bony metastases.  EXAM: NUCLEAR MEDICINE WHOLE BODY BONE SCAN  TECHNIQUE: Whole body anterior  and posterior images were obtained approximately 3 hours after intravenous injection of radiopharmaceutical.  RADIOPHARMACEUTICALS: 26.8 mCi Technetium-51mMDP IV  COMPARISON: None in PACs  FINDINGS: There is adequate uptake of the radiopharmaceutical by the skeleton. There is adequate soft tissue clearance and renal activity. Activity within the calvarium, spine, ribs, and pelvis is normal. Activity within the upper and lower extremities is within the limits of normal as well.  IMPRESSION: No evidence of metastatic disease to the skeleton.   Electronically Signed  By: David JMartiniqueM.D.  On: 10/26/2015 12:53    PATHOLOGY:   [facility]  Biopsy of left breast mass at the 12:00 position demonstrates invasive ductal carcinoma, high-grade.  Left axillary lymph node biopsy demonstrates invasive ductal carcinoma, high-grade, consistent with metastasis to lymph node. Breast prognostic panel is performed demonstrating ER/PR negativity, HER-2/neu negativity, and a KI-67 Marker of 85%.  ASSESSMENT/PLAN:  Iron deficiency anemia She did well with IV iron replacement. She was severely iron deficiency and iron stores were replaced in preparation for chemotherapy. Iron levels will be reassessed at her next follow-up  Cancer of central portion of left female breast ([place] Left-sided breast cancer, high-grade, measuring 1.6 cm in largest dimension with biopsy-proven metastatic disease to left axillary lymph node(s), ER/PR negative, HER-2 negative with a high KI-67 Marker of 85%.  CT imaging and bone scan were reviewed. She has seen genetics.  Results from genetics testing should be available later this week. Mood is improved and we have put her in contact with another hispanic patient who has just completed all therapy including reconstruction; she notes this has helped tremendously.   We will refer her back to Dr. JArnoldo Moralefor surgery and port placement.  She  will need chemotherapy teaching. ECHO is completed and reviewed. [facility] in a dose dense fashion followed by weekly taxol.   All questions were answered. The patient knows to call the clinic with any problems, questions or concerns. We can certainly see the patient much sooner if necessary.  This document serves as a record of services personally performed by SAncil Linsey MD. It was created on her behalf by EArlyce Harman a trained medical scribe. The creation of this record is based on the scribe's personal observations and the provider's statements to them. This document has been checked and approved by the attending provider.  I have reviewed the above documentation for accuracy and completeness, and I agree with the above.   This note is electronically signed by: PMolli Hazard MD   11/09/2015 10:53 AM

## 2015-11-11 ENCOUNTER — Encounter (HOSPITAL_COMMUNITY): Payer: Self-pay | Admitting: Oncology

## 2015-11-13 ENCOUNTER — Telehealth: Payer: Self-pay | Admitting: Genetic Counselor

## 2015-11-20 NOTE — Telephone Encounter (Signed)
Had some difficulty getting in touch with [name].  Eventually reached out to her niece who is going to let her know that I will call her with interpreting services on Monday at 10 AM with her results.  Also let her know that I have 'good news' regarding those results.

## 2015-11-23 ENCOUNTER — Encounter: Payer: Self-pay | Admitting: Genetic Counselor

## 2015-11-23 ENCOUNTER — Telehealth: Payer: Self-pay | Admitting: Genetic Counselor

## 2015-11-23 ENCOUNTER — Other Ambulatory Visit: Payer: Self-pay

## 2015-11-23 NOTE — Telephone Encounter (Signed)
Discussed with Ms. Reinecke through [employer] interpreter, Mickel Baas, [phone], that her genetic test result was negative for any known pathogenic mutations within any of 28 genes that would cause her to be at an increased genetic risk for breast, ovarian, or other related cancers.  Discussed that one uncertain change (VUS) was found in one copy of the RAD51C gene, but discussed that we treat this like a negative test result and we reviewed why we do that.  Discussed that, especially, since most of Ms. Mignone's family members have never had cancer that this is most likely a reassuring result for [place], although we can never totally rule out a genetic cause.  Discussed that she should continue to follow her doctors' recommendations for future cancer screening.  Her sisters, daughters, and nieces can begin mammogram screening around the age of 36 based on her early age of breast cancer diagnosis.  They should make their doctors aware of this history of breast cancer.  Encouraged Ms. Lansing to keep her phone number up-to-date with [place], so that we can let her know if this VUS gets updated by the lab in the future.

## 2015-11-24 ENCOUNTER — Ambulatory Visit: Payer: Self-pay | Admitting: Genetic Counselor

## 2015-11-24 ENCOUNTER — Other Ambulatory Visit (HOSPITAL_COMMUNITY): Payer: Self-pay | Admitting: Hematology & Oncology

## 2015-11-24 DIAGNOSIS — Z1379 Encounter for other screening for genetic and chromosomal anomalies: Secondary | ICD-10-CM

## 2015-11-24 DIAGNOSIS — Z8049 Family history of malignant neoplasm of other genital organs: Secondary | ICD-10-CM

## 2015-11-24 DIAGNOSIS — C50112 Malignant neoplasm of central portion of left female breast: Secondary | ICD-10-CM

## 2015-11-27 NOTE — H&P (Signed)
  NTS SOAP Note  Vital Signs:  Vitals as of: 5/99/7741: Systolic 423: Diastolic 78: Heart Rate 75: Temp 97.42F (Temporal): Height 81f 2in: Weight 142Lbs 0 Ounces: BMI 25.97   BMI : 25.97 kg/m2  Subjective: This 35year old female presents for of a left breast cancer.  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  No family h/o breast carcinoma.  Spoke to patient thru husband.  Review of Symptoms:  Constitutional:fatigue Head:unremarkable Eyes:unremarkable   Nose/Mouth/Throat:unremarkable Cardiovascular:  unremarkable Respiratory:unremarkable Gastrointestindyspepsia Genitourinary:unremarkable   back pain Skin:unremarkable as above Hematolgic/Lymphatic:unremarkable   Allergic/Immunologic:unremarkable   Past Medical History:  Reviewed  Past Medical History  Surgical History: none Medical Problems: migraines Allergies: nkda Medications: propranolol   Social History:Reviewed  Social History  Preferred Language: Spanish; Castilian Race:  Other Ethnicity: Hispanic / Latino Age: [age]year Marital Status:  S Alcohol: no   Smoking Status: Never smoker reviewed on 10/13/2015 Functional Status reviewed on 10/13/2015 ------------------------------------------------ Bathing: Normal Cooking: Normal Dressing: Normal Driving: Normal Eating: Normal Managing Meds: Normal Oral Care: Normal Shopping: Normal Toileting: Normal Transferring: Normal Walking: Normal Cognitive Status reviewed on 10/13/2015 ------------------------------------------------ Attention: Normal Decision Making: Normal Language: Normal Memory: Normal Motor: Normal Perception: Normal Problem Solving: Normal Visual and Spatial: Normal   Family History:Reviewed  Family Health History Family History is Unknown    Objective Information: General:Well appearing, well nourished in no distress. Neck:Supple without  lymphadenopathy.  Heart:RRR, no murmur or gallop.  Normal S1, S2.  No S3, S4.  Lungs:  CTA bilaterally, no wheezes, rhonchi, rales.  Breathing unlabored. Left breast with hard retroareolar mass present.  Multiple axillary lymph nodes palpable.  Right breast unremarkable. mammogram and path reports reviewed Assessment:Left breast carcinoma  Diagnoses: 174.1  C50.112 Malignant neoplasm of central part of female breast (Malignant neoplasm of central portion of left female breast)  Procedures: [id number]- OFFICE OUTPATIENT NEW 30 MINUTES    Plan:  Discussed surgicla options with patient.  Given her smaller breast size and central location of the mass (positive multiple lymph nodes), recommend left modified radical mastectomy.  All questions answered.  Patient has been seen by oncology. Will proceed with a left modified radical mastectomy, Port-A-Cath placement on 12/09/2015.  Patient Education:Alternative treatments to surgery were discussed with patient (and family).  Risks and benefits  of procedure including bleeding, infection, nerve injury, and pneumothorax were fully explained to the patient (and family) who gave informed consent. Patient/family questions were addressed.

## 2015-11-30 ENCOUNTER — Other Ambulatory Visit (HOSPITAL_COMMUNITY): Payer: Self-pay | Admitting: Hematology & Oncology

## 2015-12-03 NOTE — Patient Instructions (Addendum)
Michele Salas  12/03/2015     @PREFPERIOPPHARMACY @   Your procedure is scheduled on  12/09/2015   Report to [facility] at  615 A.M.  Call this number if you have problems the morning of surgery:  [phone]   Remember:  Do not eat food or drink liquids after midnight.  Take these medicines the morning of surgery with A SIP OF WATER  Propranolol.   Do not wear jewelry, make-up or nail polish.  Do not wear lotions, powders, or perfumes.  You may wear deodorant.  Do not shave 48 hours prior to surgery.  Men may shave face and neck.  Do not bring valuables to the hospital.  [facility] is not responsible for any belongings or valuables.  Contacts, dentures or bridgework may not be worn into surgery.  Leave your suitcase in the car.  After surgery it may be brought to your room.  For patients admitted to the hospital, discharge time will be determined by your treatment team.  Patients discharged the day of surgery will not be allowed to drive home.   Name and phone number of your driver:   family Special instructions:  none  Please read over the following fact sheets that you were given. 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 Si le harn una mastectoma total (mastectoma simple), le extirparn toda la mama. Si le harn Nutritional therapist radical modificada, le extirparn la mama y el pezn junto con los ganglios linfticos de la axila. Tambin pueden extirparle una parte de las membranas que recubren los tejidos musculares de la mama. INFORME A SU MDICO:  Cualquier alergia que tenga.  Todos los [employer], incluidos  vitaminas, hierbas, gotas oftlmicas, cremas y medicamentos de venta libre.  Problemas previos que usted o los [employer] de su familia hayan tenido con el uso de anestsicos.  Enfermedades de la sangre que tenga.  Si tiene cirugas previas.  Enfermedades que tenga. RIESGOS Y COMPLICACIONES En general, se trata de un procedimiento seguro. Sin embargo, pueden presentarse problemas, por ejemplo:  Dolor.  Infeccin.  Hemorragia.  Tejido cicatricial.  Entumecimiento en el trax del lado de la [place].  Acumulacin de lquido debajo de los colgajos de piel desde donde se extrajo la mama (seroma).  Sensacin de hormigueo o palpitante.  Estrs o tristeza por haber perdido la mama. Si le extrajeron los ganglios linfticos de la axila, puede tener hinchazn, debilidad o entumecimiento en el brazo del lado donde le practicaron la [place]. ANTES DEL PROCEDIMIENTO  Consulte a su mdico acerca de estos temas:  Cambiar o suspender los medicamentos que toma habitualmente. Esto es muy importante si toma medicamentos para la diabetes o anticoagulantes.  Tomar medicamentos, como aspirina e ibuprofeno. Estos medicamentos pueden tener un efecto anticoagulante en la [place]. No tome estos medicamentos antes del procedimiento si el mdico le indica que no lo haga.  Siga las indicaciones del mdico respecto de las restricciones para las comidas o las bebidas.  Haga planes para que una persona la lleve de vuelta a su casa despus del procedimiento. PROCEDIMIENTO  Se colocar una va intravenosa (IV)  en una de las venas.  Le administrarn un medicamento que la har dormir (anestesia general).  Le limpiarn el pecho con una solucin que destruir las bacterias (antisptico).  Le harn una incisin amplia alrededor del pezn. Le extirparn el pezn y la piel de la parte interna de la incisin, junto con todo el tejido [place].  Si le harn Dani Gobble radical modificada:  Le extirparn la  membrana que recubre los msculos torcicos.  La incisin puede extenderse [employer] ganglios linfticos de la [place], o puede realizarse una segunda incisin.  Le extirparn los ganglios linfticos.  Pueden colocarle un drenaje en la incisin para recolectar el lquido que se acumule despus de la [place]. El drenaje se conecta con una pera de goma.  La incisin o las incisiones se cerrarn con puntos (suturas).  Se le colocar un vendaje sobre la mama y debajo del brazo. Este procedimiento puede variar segn el mdico y el hospital. DESPUS DEL PROCEDIMIENTO  Lo llevarn a una sala de recuperacin.  Le controlarn con frecuencia la presin arterial, la frecuencia cardaca, la frecuencia respiratoria y Retail buyer de oxgeno en la sangre hasta que haya desaparecido el efecto de los medicamentos administrados.  Le darn medicamentos para el dolor si los necesita.  Luego, la llevarn a una habitacin del hospital.  Marin Comment recomendarn que se levante y camine lo antes posible.  Le quitarn la va IV cuando pueda comer y beber.  Es posible que le quiten el drenaje antes de recibir el alta o puede regresar a su casa con el drenaje y la pera de goma.   Esta informacin no tiene Marine scientist el consejo del mdico. Asegrese de hacerle al mdico cualquier pregunta que tenga.   Document Released: 05/04/2005 Document Revised: 08/15/2014 Elsevier Interactive Patient Education 2016 [place] dispositivo de perfusin implantable (Implanted [place] Insertion) Un dispositivo de perfusin implantable es una va central que tiene una forma redonda y se coloca debajo de la piel. [place] como un acceso intravenoso a largo plazo para:   Medicamentos, como la quimioterapia.  Lquidos.  Nutricin lquida, como nutricin parenteral total (TPN, por sus siglas en ingls).  Anlisis de sangre INFORME A SU MDICO:  Alergias a alimentos o medicamentos.  Medicamentos que toma,  incluidos vitaminas, hierbas, gotas oftlmicas, cremas y medicamentos de venta libre.  Cualquier tipo de alergia a Public house manager.  Uso de corticoides (por va oral o cremas).  Problemas anteriores debido a anestsicos o a medicamentos que [employer] sensibilidad.  Antecedentes de hemorragias o cogulos sanguneos.  Cirugas previas.  Otros problemas de salud, incluyendo diabetes y problemas renales.  Posibilidad de embarazo, si corresponde. RIESGOS Y COMPLICACIONES En general, se trata de un procedimiento seguro. Sin embargo, Engineer, technical sales, pueden surgir problemas. Algunos posibles problemas incluyen:  Daos en los vasos sanguneos, hematomas o hemorragias en el lugar de la puncin.  Infeccin.  Cogulo en el vaso sanguneo en el cual est insertado el dispositivo.  Laceracin la piel alrededor del dispositivo.  En muy raras ocasiones, la persona puede presentar una afeccin llamada neumotrax, una acumulacin de aire en el pecho que puede provocar el colapso de uno de los pulmones. La colocacin de estos catteres mediante una gua por imgenes adecuada disminuye de [place] significativa el riesgo de sufrir un neumotrax. ANTES DEL PROCEDIMIENTO   Su mdico podra indicarle que se realice anlisis de sangre. Estos anlisis pueden ayudar a mostrar el funcionamiento de sus riones e hgado. Tambin pueden  determinar la eficacia con la que coagula la [place].  Si toma anticoagulantes, pregntele al mdico cundo debe suspenderlos.  Pdale a alguna persona que lo lleve a su casa. Esto es necesario si lo han sedado para el procedimiento. PROCEDIMIENTO  La insercin del dispositivo de perfusin por lo general demora alrededor de 30 a 50mnutos.   Le insertarn una aguja intravenosa en el brazo. A travs de esta aguja, recibir directamente en el organismo analgsicos y mDynegyque lo ayudarn a rNurse, children's(sedantes).  Permanecer acostado en la camilla y estar  conectado a monitores que registrarn su frecuencia cardaca, presin arterial y respiracin durante el procedimiento.  En su dedo se le colocar un dispositivo que controlar el oxgeno. Se le administrar oxgeno.  Durante el procedimiento, todo se mantendr tan libre de grmenes (estril) como sea posible. La piel cercana al punto de la incisin se limpiar con un antisptico, y se cubrir la zona con toallas estriles. La piel y los tejidos ms profundos alrededor de la zona del dispositivo se adormecern con anestesia local.  Se realizarn dos cortes pequeos (incisiones) en la piel para insertar el dispositivo. Uno se rPublic affairs consultantcuello para acceder a la vena en la cual se cHydrologist  Debido a que el depsito del dispositivo se colocar debajo de la piel, se realizar una pequea incisin en la parte superior del pecho y se crear una cavidad pequea para el dispositivo debajo de la piel. El catter que se cClinical cytogeneticistal dispositivo se canaliza hasta una vena central grande en el pecho. Al finalizar el procedimiento, quedar un rea pequea y prominente en el cuerpo, en el lugar donde est el depsito.  La insercin del dispositivo se realizar mediante una gua por imagen para garantizar que se haga de mSaint [place]hele[place]  El depsito tiene uTurks and Caicos Islandsde silicona que se puede pinchar con una aguja especial.  El dispositivo se purgar con solucin salina normal, y se extraer sangre para garantizar que este funcione correctamente.  Cuando el procedimiento finaliza, no quedar nada visible sConAgra Foods  Las incisiones se mantendrn unidas con puntos, pegamento quirrgico o una cinta especial. DESPUS DEL PROCEDIMIENTO  Debe permanecer en un rea de recuperacin hasta que desaparezca el efecto de la anestesia. Controlarn su presin arterial y el pulso.  Se tomar una ltima radiografa de trax para controlar la ubicacin del dispositivo y asegurarse de que no haya lesiones  en el pulmn.   Esta informacin no tiene cMarine scientistel consejo del mdico. Asegrese de hacerle al mdico cualquier pregunta que tenga.   Document Released: 05/15/2013 Document Revised: 08/15/2014 Elsevier Interactive Patient Education 2016 EJamesburgperfusin implantable: gua para eEngineer, mining(Implanted PGeneral Electric Un dispositivo de perfusin implantable es un tipo de va central que se coloca debajo de la piel. Las vas centrales se usan para proporcionar un aDiplomatic Services operational officer(IV) cuando es necesario administrarle un tratamiento o nutricin a uArdelia Memspersona a travs de las venas. Los dispositivos de perfusin implantables se usan para proporcionar un acceso intravenoso a lBarrister's clerk Un dispositivo de perfusin implantable se puede colocar por los siguientes motivos:   Necesita administrarse un medicamento intravenoso que podra provocar una irritacin en las venas pequeas de las manos o los brazos.  Necesita medicamentos por va intravenosa a largo plazo, como antibiticos.  Necesita recibir nutricin por va intravenosa durante un largo perodo.  Es necesario que le extraigan sangre con frecuencia para rOptometristanlisis de laboratorio.  Debe someterse a dilisis. Los dispositivos de perfusin implantables generalmente se colocan en la zona del pecho, pero tambin se pueden colocar en la parte superior del brazo, el abdomen o la pierna. Un dispositivo de perfusin implantable tiene dos partes principales:   Depsito. El depsito es redondo y Lexicographer como una zona pequea y prominente en la piel. El depsito es la parte en donde se inserta la aguja para Architectural technologist los medicamentos o Merchant navy officer.  Catter. El catter es un tubo delgado y flexible que se extiende desde el depsito. El catter se coloca en una vena grande. Los medicamentos que se introducen en el depsito, pasan a travs del catter y luego llegan a la vena. [place]? No humedezca el lugar de la incisin. Bese o dchese segn las indicaciones del mdico.  CMO SE ACCEDE AL DISPOSITIVO DE PERFUSIN? Para acceder al dispositivo debern seguirse algunos pasos especiales.   Antes del procedimiento, podr colocarse una crema con anestesia sobre la piel. Esto hace adormecer la piel que se encuentra sobre el dispositivo.  Para acceder al dispositivo el mdico usar una tcnica estril.  El mdico debe colocarse Judene Companion y guantes estriles.  La piel alrededor del dispositivo se limpia cuidadosamente con un antisptico y se Scientist, research (life sciences).  Para introducir la aguja en el dispositivo este se debe sostener suavemente entre los guantes estriles.  Slo deben utilizarse agujas "non.coring" (que no dejan agujero) para acceder al dispositivo. Una vez que se ha accedido al dispositivo, deber controlarse el retorno de Herbalist. Esto ayuda a Forensic scientist dispositivo est en la vena y no est obstruido.  Si es necesario acceder al dispositivo de manera constante para una infusin, se colocar un apsito transparente por encima de la zona de la aguja. El apsito y la aguja debern cambiarse todas las semanas o segn las indicaciones del mdico.  Mantenga limpio y seco el apsito que cubre la [place]. No deje que se humedezca. Siga las indicaciones del mdico sobre cmo debe tomar un bao o una ducha mientras est expuesto el acceso al dispositivo.  Si no es necesario acceder al dispositivo de manera constante, no es Paramedic un apsito. QU ES EL PURGADO? El purgado ayuda a que el dispositivo no se [place]. Siga las indicaciones del mdico sobre cmo y cundo debe purgar el dispositivo. Los dispositivos de perfusin generalmente se purgan con una solucin salina o un medicamento llamado heparina. La necesidad de Corporate treasurer de cmo se use el dispositivo.   Si el dispositivo se [place] para administrar medicamentos o extraer sangre de manera  intermitente, deber purgarse de la siguiente manera:  Despus de la administracin de los medicamentos.  Luego de [place] extraccin de [place].  Como parte de una rutina de Theatre manager.  Si la infusin es constante, no necesitar limpiarlo. [place]? El dispositivo puede permanecer implantado por el tiempo que el mdico considere necesario. Cuando llega el momento de retirar el dispositivo, deber someterse a [place]. El procedimiento es similar al que se realiz para colocarlo.  [place]? Cuando tenga un dispositivo de perfusin implantado, debe buscar atencin mdica de inmediato en los siguientes casos:   Advierte un olor ftido que proviene del lugar de la incisin.  Observa hinchazn, eritemas o secrecin en el lugar de la incisin.  Observa ms hinchazn o siente ms dolor en la zona del dispositivo o a su alrededor.  Tiene fiebre que no puede controlar con [employer].   Esta informacin no tiene Marine scientist el consejo del mdico. Asegrese de hacerle al mdico cualquier pregunta que tenga.   Document Released: 05/22/2009 Document Revised: 05/15/2013 Elsevier Interactive Patient Education 2016 [place] radical modificada o total, cuidados posteriores (Total or Modified Radical Mastectomy, Care After) Siga estas instrucciones durante las prximas semanas. Estas indicaciones le proporcionan informacin acerca de cmo deber cuidarse despus del procedimiento. El mdico tambin podr darle instrucciones ms especficas. El tratamiento ha sido planificado segn las prcticas mdicas actuales, pero en algunos casos pueden ocurrir problemas. Comunquese con el mdico si tiene algn problema o tiene dudas despus del procedimiento. QU ESPERAR DESPUS DEL PROCEDIMIENTO Despus del procedimiento, es comn [employer] siguientes  sntomas:  Social research officer, government.  Entumecimiento.  Rigidez en el brazo o el hombro.  Sensacin de estrs, tristeza o depresin. Si le extrajeron los ganglios linfticos de la axila, puede tener hinchazn, debilidad o entumecimiento en el brazo del lado donde le practicaron la [place]. INSTRUCCIONES PARA EL CUIDADO EN EL HOGAR Cuidado de la incisin  Hay muchas maneras distintas de cerrar y cubrir una incisin, como puntos, pegamento para la piel y tiras [place]. Siga las instrucciones del mdico con respecto a lo siguiente:  Careers adviser herida.  Cambiar y Charity fundraiser el vendaje.  Quitar el cierre de la incisin.  [place] zona de la incisin para detectar signos de infeccin. Est atenta a lo siguiente:  Dolor, hinchazn o enrojecimiento.  Lquido, sangre o pus.  Si la enviaron de regreso a su casa con un drenaje quirrgico colocado, siga las instrucciones del mdico para vaciarlo. El bao  No tome baos de inmersin, no nade ni use el jacuzzi hasta que el mdico lo autorice.  Tome baos con esponja hasta que el mdico la autorice a ducharse o a Teacher, early years/pre de inmersin. Actividades  Reanude sus actividades normales como se lo haya indicado el mdico.  Evite el ejercicio fsico intenso.  Evite cualquier actividad que pueda causarle una lesin en el brazo que est del lado de la [place].  No levante ningn objeto que pese ms de 10libras (4,5kg). Evite levantar objetos con el brazo que est del lado de la [place].  No cargue objetos pesados [employer].  Despus de que le saquen el drenaje, debe realizar ejercicios para evitar que el brazo se entumezca e hinche. Consulte al [employer] tipos de ejercicios que son seguros para usted. Instrucciones generales  [employer] solamente como se lo haya indicado el mdico.  Puede seguir su dieta habitual.  Mantenga el brazo elevado al descansar.  No use anillos, pulseras ni otros accesorios ajustados en  el brazo, la [place] o los dedos del lado de la [place].  Si le hicieron Nutritional therapist radical modificada, infrmeles siempre a los mdicos que le extrajeron los ganglios linfticos de la axila. Esta es una informacin importante que debe proporcionar antes de ciertos procedimientos, como una extraccin de sangre para donar o la medicin de la presin arterial. SOLICITE ATENCIN MDICA SI:  Jaclynn Guarneri.  El medicamento para Glass blower/designer no le hace efecto.  La hinchazn, la debilidad o el entumecimiento del brazo no han mejorado despus de unas semanas.  Tiene una hinchazn nueva en la mama o el brazo.  Tiene enrojecimiento, hinchazn o dolor en la zona de la incisin.  Observa lquido, sangre o pus que emanan de la incisin. [place]  SI:  Tiene un dolor muy intenso en la mama o el brazo.  Siente dolor en el pecho.  Tiene dificultad para respirar.   Esta informacin no tiene Marine scientist el consejo del mdico. Asegrese de hacerle al mdico cualquier pregunta que tenga.   Document Released: 09/05/2006 Document Revised: 08/15/2014 Elsevier Interactive Patient Education 2016 [place] dispositivo de perfusin implantable: cuidados posteriores (Implanted [place] Insertion, Care After) Siga estas instrucciones durante las prximas semanas. Estas indicaciones le proporcionan informacin general acerca de cmo deber cuidarse despus del procedimiento. El mdico tambin podr darle instrucciones ms especficas. El tratamiento ha sido planificado segn las prcticas mdicas actuales, pero en algunos casos pueden ocurrir problemas. Comunquese con el mdico si tiene algn problema o tiene dudas despus del procedimiento. QU ESPERAR DESPUS DEL PROCEDIMIENTO Despus del procedimiento, es comn lo siguiente:   Molestias en el sitio de la insercin del dispositivo. Colocar compresas de hielo en la zona ayudar.  Hematomas en la piel  alrededor del dispositivo, los cuales disminuirn luego de 3 o 4 das. INSTRUCCIONES PARA EL CUIDADO EN EL HOGAR  Luego que le coloquen el dispositivo, le darn una tarjeta de informacin del fabricante. La tarjeta contiene informacin acerca del dispositivo. Llvela siempre con usted.  Sepa qu tipo de dispositivo de perfusin implantable tiene. Hay diferentes tipos de dispositivos de perfusin implantables.  Use un brazalete de alerta mdico en caso de emergencia. Esto ayudar a Theatre stage manager a los mdicos que usted tiene un dispositivo de perfusin implantable.  El dispositivo puede permanecer implantado por el tiempo que el mdico considere necesario.  Posiblemente un enfermero vaya a su domicilio para administrarle los medicamentos y cuidar del dispositivo.  Usted o un familiar pueden recibir instrucciones y Engineer, building services para Architectural technologist los medicamentos y cuidar del dispositivo en Engineer, mining. SOLICITE ATENCIN MDICA SI:   El dispositivo no funciona o no puede hacer que retorne Herbalist.  Siente escalofros o fiebre. SOLICITE ATENCIN MDICA DE INMEDIATO SI:  Observa un lquido nuevo o pus en la incisin.  Advierte un olor ftido que proviene del lugar de la incisin.  Observa hinchazn, ms eritemas o siente dolor en el lugar de la incisin o alrededor del dispositivo.  Siente falta de aire o Tourist information centre manager.   Esta informacin no tiene Marine scientist el consejo del mdico. Asegrese de hacerle al mdico cualquier pregunta que tenga.   Document Released: 05/15/2013 Document Revised: 07/30/2013 Elsevier Interactive Patient Education 2016 [employer]. PATIENT INSTRUCTIONS POST-ANESTHESIA  IMMEDIATELY FOLLOWING SURGERY:  Do not drive or operate machinery for the first twenty four hours after surgery.  Do not make any important decisions for twenty four hours after surgery or while taking narcotic pain medications or sedatives.  If you develop intractable nausea and  vomiting or a severe headache please notify your doctor immediately.  FOLLOW-UP:  Please make an appointment with your surgeon as instructed. You do not need to follow up with anesthesia unless specifically instructed to do so.  WOUND CARE INSTRUCTIONS (if applicable):  Keep a dry clean dressing on the anesthesia/puncture wound site if there is drainage.  Once the wound has quit draining you may leave it open to air.  Generally you should leave the bandage intact for twenty four hours unless there is drainage.  If the epidural site drains for more than 36-48 hours please call the anesthesia department.  QUESTIONS?:  Please feel free to call your physician or the hospital operator if you have  any questions, and they will be happy to assist you.

## 2015-12-04 ENCOUNTER — Encounter (HOSPITAL_COMMUNITY)
Admission: RE | Admit: 2015-12-04 | Discharge: 2015-12-04 | Disposition: A | Discharge status: Home / Self Care | Payer: Self-pay | Source: Ambulatory Visit | Attending: General Surgery | Admitting: General Surgery

## 2015-12-04 ENCOUNTER — Encounter (HOSPITAL_COMMUNITY): Payer: Self-pay

## 2015-12-04 ENCOUNTER — Ambulatory Visit (HOSPITAL_COMMUNITY)
Admission: RE | Admit: 2015-12-04 | Discharge: 2015-12-04 | Disposition: A | Discharge status: Home / Self Care | Payer: Self-pay | Source: Ambulatory Visit | Attending: General Surgery | Admitting: General Surgery

## 2015-12-04 DIAGNOSIS — C50912 Malignant neoplasm of unspecified site of left female breast: Secondary | ICD-10-CM | POA: Insufficient documentation

## 2015-12-04 LAB — CBC WITH DIFFERENTIAL/PLATELET
Basophils Absolute: 0 10*3/uL (ref 0.0–0.1)
Basophils Relative: 1 %
Eosinophils Absolute: 0.2 10*3/uL (ref 0.0–0.7)
Eosinophils Relative: 4 %
HCT: 38.5 % (ref 36.0–46.0)
Hemoglobin: 12.8 g/dL (ref 12.0–15.0)
Lymphocytes Relative: 32 %
Lymphs Abs: 1.7 10*3/uL (ref 0.7–4.0)
MCH: 26.4 pg (ref 26.0–34.0)
MCHC: 33.2 g/dL (ref 30.0–36.0)
MCV: 79.5 fL (ref 78.0–100.0)
Monocytes Absolute: 0.4 10*3/uL (ref 0.1–1.0)
Monocytes Relative: 8 %
Neutro Abs: 3 10*3/uL (ref 1.7–7.7)
Neutrophils Relative %: 55 %
Platelets: 284 10*3/uL (ref 150–400)
RBC: 4.84 MIL/uL (ref 3.87–5.11)
RDW: 20.3 % — ABNORMAL HIGH (ref 11.5–15.5)
WBC: 5.4 10*3/uL (ref 4.0–10.5)

## 2015-12-04 LAB — COMPREHENSIVE METABOLIC PANEL
ALT: 39 U/L (ref 14–54)
AST: 27 U/L (ref 15–41)
Albumin: 4.1 g/dL (ref 3.5–5.0)
Alkaline Phosphatase: 72 U/L (ref 38–126)
Anion gap: 9 (ref 5–15)
BUN: 10 mg/dL (ref 6–20)
CO2: 22 mmol/L (ref 22–32)
Calcium: 9.5 mg/dL (ref 8.9–10.3)
Chloride: 107 mmol/L (ref 101–111)
Creatinine, Ser: 0.51 mg/dL (ref 0.44–1.00)
GFR calc Af Amer: >60 mL/min (ref >60)
GFR calc non Af Amer: >60 mL/min (ref >60)
Glucose, Bld: 114 mg/dL — ABNORMAL HIGH (ref 65–99)
Potassium: 3.8 mmol/L (ref 3.5–5.1)
Sodium: 138 mmol/L (ref 135–145)
Total Bilirubin: 0.6 mg/dL (ref 0.3–1.2)
Total Protein: 7.2 g/dL (ref 6.5–8.1)

## 2015-12-04 LAB — TYPE AND SCREEN
ABO/RH(D): O POS
Antibody Screen: NEGATIVE

## 2015-12-04 LAB — HCG, SERUM, QUALITATIVE: Preg, Serum: NEGATIVE

## 2015-12-04 NOTE — Pre-Procedure Instructions (Signed)
Patient given information to sign up for my chart at home. 

## 2015-12-04 NOTE — Pre-Procedure Instructions (Signed)
Patient in for PAT. Communicated with her through interpreter, Anastasio Auerbach from [employer].

## 2015-12-06 MED ORDER — ONDANSETRON HCL 8 MG PO TABS: 8.0000 mg | ORAL_TABLET | Freq: Three times a day (TID) | ORAL | Status: DC | PRN

## 2015-12-06 MED ORDER — LIDOCAINE-PRILOCAINE 2.5-2.5 % EX CREA: TOPICAL_CREAM | CUTANEOUS | Status: DC

## 2015-12-06 MED ORDER — PROCHLORPERAZINE MALEATE 10 MG PO TABS: 10.0000 mg | ORAL_TABLET | Freq: Four times a day (QID) | ORAL | Status: DC | PRN

## 2015-12-06 NOTE — Patient Instructions (Addendum)
[place]   CHEMOTHERAPY INSTRUCTIONS  Premeds: Aloxi - for nausea/vomiting prevention/reduction. Emend - for nausea/vomiting prevention/reduction. Dexamethasone- steroid - given to reduce the risk of you having an allergic type reaction to the chemotherapy. Dex can cause you to feel energized, nervous/anxious/jittery, make you have trouble sleeping, and/or make you feel hot/flushed in the face/neck and/or look pink/red in the face/neck. These side effects will pass as the Dex wears off. (takes 30 minutes to infuse)  Adriamycin - bone marrow suppression, nausea, vomiting, hair loss, mouth sores, cardiotoxicity- weakening of the pumping muscle of the heart (this is why we do the 2D Echoes/MUGA scans), sensitivity to light, will turn urine red for a few voids after receiving it. After voiding red a few times, your urine should begin to go back to a normal yellow color. This meciation takes approximately 10-15 minutes to administer.  Cytoxan - can cause hemorrhagic cystitis (bloody urine) - this chemo irritates your bladder! We need you drinking 64 oz of fluid (preferably water/decaff fluids) 2 days prior to chemo and for up to 4-5 days after chemo. Drink more if you can. Do not hold your urine. Urinate before you go to bed and if you wake up in the middle of the night. This can also cause nausea/vomiting and hair loss. (takes 30 minutes to infuse)  Neulasta - this medication is not chemo but being given because you have had chemo. It is usually given 27 hours after the completion of chemotherapy. This medication works by boosting your bone marrow's supply of white blood cells. White blood cells are what protect our bodies against infection. The medication is given in the form of a subcutaneous injection. It is given in the fatty tissue of your abdomen. It is a short needle. The major side effect of this medication is bone or muscle pain. The drug of choice to relieve or  lessen the pain is Aleve or Ibuprofen. If a physician has ever told you not to take Aleve or Ibuprofen - then don't take it. You should then take Tylenol/acetaminophen. Take either medication as the bottle directs you to.  The level of pain you experience as a result of this injection can range from none, to mild or moderate, or severe. Please let us know if you develop moderate or severe bone pain.     POTENTIAL SIDE EFFECTS OF TREATMENT: Increased Susceptibility to Infection, Vomiting, Constipation, Red or Pink Urine (with Adriamycin), Changes in Character of Skin and Nails (brittleness, dryness,etc.), Bone Marrow Suppression, Complete Hair Loss, Nausea, Diarrhea, Sun Sensitivity and Mouth Sores   SELF IMAGE NEEDS AND REFERRALS MADE: Obtain hair accessories as soon as possible (wigs, scarves, turbans,caps,etc.)  Referral to Look Good, Feel Better consultant   EDUCATIONAL MATERIALS GIVEN AND REVIEWED: Chemotherapy and You booklet Specific Instructions Sheets: Adriamycin, Cytoxan, Neulasta, Aloxi, Emend, Dexamethasone, Zofran tablets, Compazine tablets, EMLA cream   SELF CARE ACTIVITIES WHILE ON CHEMOTHERAPY: Increase your fluid intake 48 hours prior to treatment and drink at least 2 quarts per day after treatment., No alcohol intake., No aspirin or other medications unless approved by your oncologist., Eat foods that are light and easy to digest., Eat foods at cold or room temperature., No fried, fatty, or spicy foods immediately before or after treatment., Have teeth cleaned professionally before starting treatment. Keep dentures and partial plates clean., Use soft toothbrush and do not use mouthwashes that contain alcohol. Biotene is a good mouthwash that is available at most pharmacies or  may be ordered by calling [phone]., Use warm salt water gargles (1 teaspoon salt per 1 quart warm water) before and after meals and at bedtime. Or you may rinse with 2 tablespoons of three -percent  hydrogen peroxide mixed in eight ounces of water., Always use sunscreen with SPF (Sun Protection Factor) of 50 or higher., Use your nausea medication as directed to prevent nausea., Use your stool softener or laxative as directed to prevent constipation. and Use your anti-diarrheal medication as directed to stop diarrhea.  Please wash your hands for at least 30 seconds using warm soapy water. Handwashing is the #1 way to prevent the spread of germs. Stay away from sick people or people who are getting over a cold. If you develop respiratory systems such as green/yellow mucus production or productive cough or persistent cough let us know and we will see if you need an antibiotic. It is a good idea to keep a pair of gloves on when going into grocery stores/[employer] to decrease your risk of coming into contact with germs on the carts, etc. Carry alcohol hand gel with you at all times and use it frequently if out in public. All foods need to be cooked thoroughly. No raw foods. No medium or undercooked meats, eggs. If your food is cooked medium well, it does not need to be hot pink or saturated with bloody liquid at all. Vegetables and fruits need to be washed/rinsed under the faucet with a dish detergent before being consumed. You can eat raw fruits and vegetables unless we tell you otherwise but it would be best if you cooked them or bought frozen. Do not eat off of salad bars or hot bars unless you really trust the cleanliness of the restaurant. If you need dental work, please let Dr. Whitney Muse know before you go for your appointment so that we can coordinate the best possible time for you in regards to your chemo regimen. You need to also let your dentist know that you are actively taking chemo. We may need to do labs prior to your dental appointment. We also want your bowels moving at least every other day. If this is not happening, we need to know so that we can get you on a bowel regimen to help you go. If you are  going to have sex, you must use a condom! Do not get pregnant while taking chemo.     MEDICATIONS: You have been given prescriptions for the following medications:  Zofran/Ondansetron 80m tablet. Take 1 tablet every 8 hours as needed for nausea/vomiting. (#1 nausea med to take, this can constipate)  Compazine/Prochlorperazine 186mtablet. Take 1 tablet every 6 hours as needed for nausea/vomiting. (#2 nausea med to take, this can make you sleepy)  EMLA cream. Apply a quarter size amount to port site 1 hour prior to chemo. Do not rub in. Cover with plastic wrap.   Over-the-Counter Meds:  Miralax 1 capful in 8 oz of fluid daily. May increase to two times a day if needed. This is a stool softener. If this doesn't work proceed you can add:  Senokot S  - start with 1 tablet two times a day and increase to 4 tablets two times a day if needed. (total of 8 tablets in a 24 hour period). This is a stimulant laxative.   Call usKoreaf this does not help your bowels move.   Imodium 45m47mapsule. Take 2 capsules after the 1st loose stool and then 1 capsule every  2 hours until you go a total of 12 hours without having a loose stool. Call the [facility] if loose stools continue. If diarrhea occurs @ bedtime, take 2 capsules @ bedtime. Then take 2 capsules every 4 hours until morning. Call [facility].  SYMPTOMS TO REPORT AS SOON AS POSSIBLE AFTER TREATMENT:  FEVER GREATER THAN 100.5 F  CHILLS WITH OR WITHOUT FEVER  NAUSEA AND VOMITING THAT IS NOT CONTROLLED WITH YOUR NAUSEA MEDICATION  UNUSUAL SHORTNESS OF BREATH  UNUSUAL BRUISING OR BLEEDING  TENDERNESS IN MOUTH AND THROAT WITH OR WITHOUT PRESENCE OF ULCERS  URINARY PROBLEMS  BOWEL PROBLEMS  UNUSUAL RASH    Wear comfortable clothing and clothing appropriate for easy access to any Portacath or PICC line. Let us know if there is anything that we can do to make your therapy better!      I have been informed and understand all of the  instructions given to me and have received a copy. I have been instructed to call the clinic [phone] or my family physician as soon as possible for continued medical care, if indicated. I do not have any more questions at this time but understand that I may call the [facility] or the Patient Navigator at [phone] during office hours should I have questions or need assistance in obtaining follow-up care.           Doxorubicin injection Qu es este medicamento? La DOXORRUBICINA es un agente quimioteraputico. Se utiliza para el tratamiento de muchos tipos de cnceres, como enfermedad de Hodgkin, leucemia, linfoma no Hodgkin, neuroblastoma, sarcoma y el tumor de [place]. Se utiliza tambin para el tratamiento del cncer de vejiga, mama, pulmn, ovarios, estmago, y tiroides. Este medicamento puede ser utilizado para otros usos; si tiene alguna pregunta consulte con su proveedor de atencin mdica o con su farmacutico. Qu le debo informar a mi profesional de la salud antes de tomar este medicamento? Necesita saber si usted presenta alguno de los siguientes problemas o situaciones: -trastornos sanguneos -enfermedad cardiaca, ataque cardiaco reciente -infeccin (especialmente infecciones virales, como varicela o herpes) -latidos cardacos irregulares -enfermedad heptica -radioterapia reciente o continuada -una reaccin alrgica o inusual a la doxorrubicina, a otros agentes quimioteraputicos, a otros medicamentos, alimentos, colorantes o conservantes -si est embarazada o buscando quedar embarazada -si est amamantando a un beb Cmo debo utilizar este medicamento? Este medicamento se administra mediante infusin por va intravenosa. Lo administra un profesional de la salud calificado en un hospital o en un entorno clnico. Si experimenta dolor, hinchazn, ardor o cualquier sensacin inusual alrededor del lugar de la inyeccin, informe inmediatamente a su profesional de [employer]. Hable con su pediatra para informarse acerca del uso de este medicamento en nios. Puede requerir atencin especial. Sobredosis: Pngase en contacto inmediatamente con un centro toxicolgico o una sala de urgencia si usted cree que haya tomado demasiado medicamento. ATENCIN: [employer] es solo para usted. No comparta este medicamento con nadie. Qu sucede si me olvido de una dosis? Es importante no olvidar ninguna dosis. Informe a su mdico o a su profesional de la salud si no puede asistir a Photographer. Qu puede interactuar con este medicamento? No tome esta medicina con ninguno de los siguientes medicamentos: -cisapride -droperidol -halofantrina -pimozida -zidovudina Esta medicina tambin puede interactuar con los siguientes medicamentos: -cloroquina -clorpromacina -claritromicina -ciclofosfamida -ciclosporina -eritromicina -medicamentos para la depresin, ansiedad o trastornos psicticos -medicamentos utilizados para el pulso cardiaco irregular, tales como amiodarona, bepridil, dofetilida, encainida, flecainida, propafenona, quinidina -  medicamentos para convulsiones, tales como etotona, fosfenitona, fenitona -medicamentos para las nuseas, vmito, tales como dolasetrn, [place], Contractor -medicamentos para incrementar los conteos sanguneos, tales como filgrastim, pegfilgrastim, sargramostim -metadona -metotrexato -pentamidina -progesterona -vacunas -verapamilo Consulte a su mdico o a su profesional de la salud antes de tomar cualquiera de los siguientes medicamentos: -acetaminofeno -aspirina -ibuprofeno -quetoprofeno -naproxeno Puede ser que esta lista no menciona todas las posibles interacciones. Informe a su profesional de [employer]	[place] de [employer] productos a base de hierbas, medicamentos de [place] o suplementos nutritivos que est tomando. Si usted fuma, consume bebidas alcohlicas o si utiliza drogas ilegales, indqueselo tambin a su  profesional de [employer]	[place]. Algunas sustancias pueden interactuar con su medicamento. A qu debo estar atento al usar Coca[employer]Cola? Se supervisar su condicin atentamente mientras reciba este medicamento. Tendr que hacerse anlisis de sangre peridicos mientras recibe [place]. Este medicamento puede hacerle sentir un Nurse, mental health. Esto es normal ya que la quimioterapia afecta tanto a las clulas sanas como a las clulas cancerosas. Si presenta alguno de los [employer], infrmelos. Sin embargo, contine con el tratamiento aun si se siente enfermo, a menos que su mdico le indique que lo suspenda. Despus de varios das de [place], [place] orina puede ser de color rojo. Esto no significa sangre en la orina. Si su orina es un color oscuro o Forensic psychologist, comunquese con su mdico. En algunos casos, podr recibir [employer] para ayudarle con los efectos secundarios. [place] para usar. Consulte a su mdico o a su profesional de la salud por asesoramiento si tiene fiebre, escalofros, dolor de garganta o cualquier otro sntoma de resfro o gripe. No se trate usted mismo. Este medicamento puede reducir la capacidad del cuerpo para combatir infecciones. Trate de no acercarse a personas que estn enfermas. [employer] puede aumentar el riesgo de magulladuras o sangrado. Consulte a su mdico o a su profesional de la salud si observa sangrados inusuales. Proceda con cuidado al cepillar sus dientes, usar hilo dental o Risk manager palillos para los dientes, ya que puede contraer una infeccin o Therapist, art con mayor facilidad. Si se somete a algn tratamiento dental, informe a su dentista que est [employer]. Evite tomar productos que contienen aspirina, acetaminofeno, ibuprofeno, naproxeno o quetoprofeno a menos que as lo indique su mdico. Estos productos pueden disimular la fiebre. Los hombres y las mujeres en edad de procrear deben Risk manager mtodos  anticonceptivos eficaces mientras reciben este medicamento. No se debe quedar embarazada mientras reciben Coca[employer]Cola. Existe la posibilidad de efectos secundarios graves a un beb sin nacer. Para ms informacin hable con su profesional de la salud o su farmacutico. No debe amamantar a un beb mientras est tomando este medicamento. Evite que otras personas tocan su orina u otros fluidos corporales por lo menos 5 das despus de cada tratamiento. Quienes cuidan de los pacientes deben Risk manager guantes de ltex para English as a second language teacher contacto con la [place] y otros fluidos corporales durante este tiempo. Existe una cantidad mxima de este medicamento que debe recibir a lo largo de su vida. La cantidad depende de la afeccin mdica siendo tratado y su salud en general. Su mdico observar la cantidad de este medicamento que usted recibe en su vida. Informe a su mdico si usted ha tomado este medicamento antes. Qu efectos secundarios puedo tener al [employer] este medicamento? Efectos secundarios que debe informar a su mdico o a Barrister's clerk de la salud tan pronto como sea posible: -reacciones alrgicas como erupcin cutnea,  picazn o urticarias, hinchazn de la cara, labios o lengua -conteos sanguneos bajos - este medicamento puede reducir la cantidad de glbulos blancos, glbulos rojos y plaquetas. Su riesgo de infeccin y [place]. -signos de infeccin - fiebre o escalofros, tos, dolor de garganta, Social research officer, government o dificultad para orinar -signos de reduccin de plaquetas o sangrado - magulladuras, puntos rojos en la piel, heces de color oscuro o con aspecto alquitranado, sangre en la orina -signos de reduccin de glbulos rojos - cansancio o debilidad inusual, desmayos, sensacin de mareo -problemas respiratorios -dolor en el pecho -pulso cardiaco rpido, irregular -llagas en la boca -nuseas, vmito -dolor, enrojecimiento, hinchazn en el lugar de la inyeccin -dolor, hormigueo,  entumecimiento de manos o pies -hinchazn de tobillos, pies o manos -sangrado, magulladuras inusuales Efectos secundarios que, por lo general, no requieren Geophysical data processor (debe informarlos a su mdico o a su profesional de la salud si persisten o si son molestos): -diarrea -enrojecimiento de la cara -cada del cabello -prdida del apetito -ausencia de perodos menstruales -dao o Glass blower/designer de las uas -ojos rojos o llorosos -color rojo de la orina -Higher education careers adviser Puede ser que esta lista no menciona todos los posibles efectos secundarios. Comunquese a su mdico por asesoramiento mdico [employer] Usted puede informar los efectos secundarios a la FDA por telfono al 1-800-FDA-1088. Dnde debo guardar mi medicina? Este medicamento se administra en hospitales o clnicas y no necesitar guardarlo en su domicilio. ATENCIN: Este folleto es un resumen. Puede ser que no cubra toda la posible informacin. Si usted tiene preguntas acerca de esta medicina, consulte con su mdico, su farmacutico o su profesional de Technical sales engineer.    2016, Elsevier/[employer]. (2014-09-16 00:00:00) Cyclophosphamide injection Qu es este medicamento? La CICLOFOSFAMIDA es un agente quimioteraputico. Este medicamento reduce el crecimiento de las clulas cancerosas. Este medicamento se [place] en el tratamiento de varios tipos de cncer como linfoma, mieloma, leucemia, cncer de mama y cncer de ovarios, por ejemplo. Este medicamento puede ser utilizado para otros usos; si tiene alguna pregunta consulte con su proveedor de atencin mdica o con su farmacutico. Qu le debo informar a mi profesional de la salud antes de tomar este medicamento? Necesita saber si usted presenta alguno de los siguientes problemas o situaciones: -trastorno sanguneo -antecedentes de quimioterapia -infeccin -enfermedad renal -enfermedad heptica -radioterapia reciente o en curso -tumors en la mdula  sea -una reaccin alrgica o inusual a la ciclofosfamida, a otros agentes quimioteraputicos, a otros medicamentos, alimentos, colorantes o conservantes -si est embarazada o buscando quedar embarazada -si est amamantando a un beb Cmo debo utilizar este medicamento? Este medicamento normalmente se administra mediante inyeccin por va intravenosa o intramuscular o infusin por va intravenosa. Lo administra un profesional de la salud calificado en un hospital o en un entorno clnico. Hable con su pediatra para informarse acerca del uso de este medicamento en nios. Puede requerir atencin especial. Sobredosis: Pngase en contacto inmediatamente con un centro toxicolgico o una sala de urgencia si usted cree que haya tomado demasiado medicamento. ATENCIN: [employer] es solo para usted. No comparta este medicamento con nadie. Qu sucede si me olvido de una dosis? Es importante no olvidar ninguna dosis. Informe a su mdico o a su profesional de la salud si no puede asistir a Photographer. Qu puede interactuar con este medicamento? Esta medicina puede interactuar con los siguientes medicamentos: -amiodarona -anfotericina B -azatioprina -ciertos medicamentos antivricos para el VIH o SIDA, tales como inhibidores de la proteasa (indinavir, ritonavir)  y zidovudina -ciertos medicamentos para la presin sangunea, tales como benazepril, captopril, enalapril, fosinopril, lisinopril, moexipril, monopril, perindopril, quinapril, ramipril, trandolapril -ciertos medicamentos para el cncer tales como antraciclnicos (daunorrubicina, doxorrubicina), busulfn, citarabina, paclitaxel, pentostatina, tamoxifeno, trastuzumab -ciertos diurticos, tales como clorotiazida, clortalidona, hidroclorotiazida, indapamida, metolazona -ciertos medicamentos que tratan o previenen cogulos sanguneos, como warfarina -ciertos relajantes musculares, tales como  succinilcolina -ciclosporina -etanercept -indometacina -medicamentos para incrementar los conteos sanguneos, tales como filgrastim, pegfilgrastim, sargramostim -medicamentos utilizados para la anestesia general -metronidazol -natalizumab Puede ser que esta lista no menciona todas las posibles interacciones. Informe a su profesional de [employer]	[place] de [employer] productos a base de hierbas, medicamentos de [place] o suplementos nutritivos que est tomando. Si usted fuma, consume bebidas alcohlicas o si utiliza drogas ilegales, indqueselo tambin a su profesional de [employer]	[place]. Algunas sustancias pueden interactuar con su medicamento. A qu debo estar atento al usar Coca-Cola? Visite a su mdico para chequear su evolucin. Este medicamento puede hacerle sentir un Nurse, mental health. Esto no es raro ya que la quimioterapia afecta tanto a las clulas sanas como a las clulas cancerosas. Si presenta alguno de los [employer], infrmelos. Sin embargo, contine con el tratamiento aun si se siente enfermo, a menos que su mdico le indique que lo suspenda. Beba agua u otros lquidos como se le haya indicado. Orine con frecuencia, especialmente de noche. En algunos casos, podr recibir [employer] para ayudarle con los efectos secundarios. Siga las instrucciones de [place]. Consulte a su mdico o a su profesional de la salud por asesoramiento si tiene fiebre, escalofros, dolor de garganta o cualquier otro sntoma de resfro o gripe. No se trate usted mismo. Este medicamento puede reducir la capacidad del cuerpo para combatir infecciones. Trate de no acercarse a personas que estn enfermas. [employer] puede aumentar el riesgo de magulladuras o sangrado. Consulte a su mdico o a su profesional de la salud si nota sangrados inusuales. Proceda con cuidado al cepillar sus dientes, usar hilo dental o Risk manager palillos para los dientes, ya que puede contraer una infeccin o Therapist, art con  mayor facilidad. Si se somete a algn tratamiento dental, informe a su dentista que est recibiendo Coca[employer]a. Puede experimentar mareos o somnolencia. No conduzca ni utilice maquinaria ni haga nada que Associate Professor en estado de alerta hasta que sepa cmo le afecta este medicamento. No se debe quedar embarazada mientras recibe este medicamento o durante 1 ao despus de terminarlo. Las mujeres deben informar a su mdico si estn buscando quedar embarazadas o si creen que estn embarazadas. Los hombres no deben tener hijos mientras estn recibiendo Coca[employer]Cola y durante 4 meses despus de terminarlo. Existe la posibilidad de efectos secundarios graves a un beb sin nacer. Para ms informacin hable con su profesional de la salud o su farmacutico. No debe Economist a un beb mientras est usando este medicamento. Este medicamento puede interferir con la capacidad de tener hijos. En algunas mujeres, este medicamento ha causado insuficiencia ovrica. En algunos hombres, este medicamento ha causado reduccin de los conteos de [place]. Usted debe consultarse con su mdico o su profesional de la salud si est preocupada por su fertilidad. Si va a someterse a una operacin, informe a su mdico o a su profesional de la salud que ha [employer]. Qu efectos secundarios puedo tener al [employer] este medicamento? Efectos secundarios que debe informar a su mdico o a Barrister's clerk de la salud tan pronto como sea posible: -reacciones alrgicas como erupcin cutnea,  picazn o urticarias, hinchazn de la cara, labios o lengua -conteos sanguneos bajos - este medicamento puede reducir la cantidad de glbulos blancos, glbulos rojos y plaquetas. Su riesgo de infeccin y [place]. -signos de infeccin - fiebre o escalofros, tos, dolor de garganta, Social research officer, government o dificultad para Garment/textile technologist -signos de reduccin de plaquetas o sangrado - magulladuras, puntos rojos en la piel, heces de  color oscuro o con aspecto alquitranado, sangre en la orina -signos de reduccin de glbulos rojos - cansancio o debilidad inusual, desmayos, aturdimiento -problemas respiratorios -orina oscura -mareos -palpitaciones -hinchazn de tobillos, pies o manos -dificultad para orinar o cambios en el volumen de orina -aumento de peso -color amarillento de los ojos o la piel Efectos secundarios que, por lo general, no requieren Geophysical data processor (debe informarlos a su mdico o a su profesional de la salud si persisten o si son molestos): -cambios en el color de las uas o piel -cada del cabello -ausencia de perodos menstruales -llagas en la boca -nuseas, vmito Puede ser que [employer] no menciona todos los posibles efectos secundarios. Comunquese a su mdico por asesoramiento mdico [employer] Usted puede informar los efectos secundarios a la FDA por telfono al 1-800-FDA-1088. Dnde debo guardar mi medicina? Este medicamento se administra en hospitales o clnicas y no necesitar guardarlo en su domicilio. ATENCIN: Este folleto es un resumen. Puede ser que no cubra toda la posible informacin. Si usted tiene preguntas acerca de esta medicina, consulte con su mdico, su farmacutico o su profesional de Technical sales engineer.    2016, Elsevier/Gold Standard. (2014-09-16 00:00:00) Palonosetron Injection Qu es este medicamento? El PALONOSETRN se [place] para prevenir las nuseas y el vmito causados por la quimioterapia. Tambin puede ayudar a prevenir las nuseas y el vmito retrasados que puede experimentar unos das despus del [place]. Este medicamento puede ser utilizado para otros usos; si tiene alguna pregunta consulte con su proveedor de atencin mdica o con su farmacutico. Qu le debo informar a mi profesional de la salud antes de tomar este medicamento? Necesita saber si usted presenta alguno de los siguientes problemas o situaciones: -una reaccin alrgica o inusual  al palonosetrn, dolasetrn, granisetrn, ondansetrn, a otros medicamentos, alimentos, colorantes o conservantes -si est embarazada o buscando quedar embarazada -si est amamantando a un beb Cmo debo [employer]? Este medicamento se administra mediante infusin por va intravenosa. Lo administra un profesional de Technical sales engineer en un hospital o en un entorno clnico. Hable con su pediatra para informarse acerca del uso de este medicamento en nios. Aunque este medicamento se puede recetar a nios tan menores como 1 mes de edad para condiciones selectivas, las precauciones se aplican. Sobredosis: Pngase en contacto inmediatamente con un centro toxicolgico o una sala de urgencia si usted cree que haya tomado demasiado medicamento. ATENCIN: [employer] es solo para usted. No comparta este medicamento con nadie. Qu sucede si me olvido de una dosis? No se aplica en este caso. Qu puede interactuar con este medicamento? -ciertos medicamentos para la depresin, ansiedad o trastornos psicticos -fentanilo -linezolid -IMAOs, tales como Carbex, Eldepryl, Marplan, Nardil y Parnate -azul de metileno (inyeccin por va intravenosa) -tramadol Puede ser que esta lista no menciona todas las posibles interacciones. Informe a su profesional de KB Home	[place] de [employer] productos a base de hierbas, medicamentos de [place] o suplementos nutritivos que est tomando. Si usted fuma, consume bebidas alcohlicas o si utiliza drogas ilegales, indqueselo tambin a su profesional de KB Home	[place]. Algunas sustancias  pueden interactuar con su medicamento. A qu debo estar atento al usar Coca-Cola? Se supervisar su estado de salud atentamente mientras reciba este medicamento. Qu efectos secundarios puedo tener al [employer] este medicamento? Efectos secundarios que debe informar a su mdico o a Barrister's clerk de la salud tan pronto como sea posible: -Chief of Staff como erupcin cutnea,  picazn o urticarias, hinchazn de la cara, labios o lengua -problemas respiratorios -confusin -mareos -pulso cardiaco rpido, irregular -fiebre y escalofros -prdida del equilibrio o coordinacin -convulsiones -sudoracin -hinchazn de las manos y pies -temblores -cansancio o debilidad inusual Efectos secundarios que, por lo general, no requieren Geophysical data processor (debe informarlos a su mdico o a su profesional de la salud si persisten o si son molestos): -estreimiento o diarrea -dolor de cabeza Puede ser que esta lista no menciona todos los posibles efectos secundarios. Comunquese a su mdico por asesoramiento mdico [employer]. Usted puede informar los efectos secundarios a la FDA por telfono al 1-800-FDA-1088. Dnde debo guardar mi medicina? Este medicamento se administra en hospitales o clnicas y no necesitar guardarlo en su domicilio. ATENCIN: Este folleto es un resumen. Puede ser que no cubra toda la posible informacin. Si usted tiene preguntas acerca de esta medicina, consulte con su mdico, su farmacutico o su profesional de Technical sales engineer.    2016, Elsevier/Gold Standard. (2014-09-16 00:00:00) Fosaprepitant injection Qu es este medicamento? El FOSAPREPITANT se [place] con otros medicamentos para prevenir las nuseas y el vmito asociados con el tratamiento del cncer (quimioterapia). Este medicamento puede ser utilizado para otros usos; si tiene alguna pregunta consulte con su proveedor de atencin mdica o con su farmacutico. Qu le debo informar a mi profesional de la salud antes de tomar este medicamento? Necesita saber si usted presenta alguno de los siguientes problemas o situaciones: -enfermedad heptica -una reaccin alrgica o inusual al fosaprepitant, al aprepitant, a otros medicamentos, alimentos, colorantes o conservantes -si est embarazada o buscando quedar embarazada -si est amamantando a un beb Cmo debo utilizar este  medicamento? Este medicamento se administra mediante inyeccin por va intravenosa. Lo administra un profesional de Technical sales engineer en un hospital o en un entorno clnico. Hable con su pediatra para informarse acerca del uso de este medicamento en nios. Puede requerir atencin especial. Sobredosis: Pngase en contacto inmediatamente con un centro toxicolgico o una sala de urgencia si usted cree que haya tomado demasiado medicamento. ATENCIN: [employer] es solo para usted. No comparta este medicamento con nadie. Qu sucede si me olvido de una dosis? No se aplica en este caso. Qu puede interactuar con este medicamento? No tome esta medicina con ninguno de los siguientes medicamentos: -cisapride -pimozida -ranolazina Esta medicina tambin puede interactuar con los siguientes medicamentos: -diltiazem -hormonas femeninas, como estrgenos, progestinas o pldoras anticonceptivas -medicamentos para infecciones micticas, tales como quetoconazol, itraconazol -medicamentos para las infecciones por el VIH -medicamentos para las convulsiones o para Chief Technology Officer la epilepsia, tales como carbamazepina o fenitona -medicamentos utilizados para tratar trastornos de ansiedad o del sueo, tales como alprazolam, diazepam o midazolam -nefazodona -paroxetina -rifampicina -algunos medicamentos quimioteraputicos, tales como etopsido, ifosfamida, vinblastina, vincristina -algunos antibiticos, tales como claritromicina, eritromicina, troleandomicina -medicamentos esteroideos, tales como dexametasona o metilprednisolona -tolbutamida -warfarina Puede ser que esta lista no menciona todas las posibles interacciones. Informe a su profesional de KB Home	[place] de [employer] productos a base de hierbas, medicamentos de [place] o suplementos nutritivos que est tomando. Si usted fuma, consume bebidas alcohlicas o si utiliza drogas ilegales, indqueselo tambin a su profesional de  la salud. Algunas sustancias pueden  interactuar con su medicamento. A qu debo estar atento al usar Coca-Cola? No tome este medicamento si ya tiene nuseas y vmito. Consulte a su proveedor de atencin [employer] qu hacer si ya tiene nuseas. Las pldoras anticonceptivas y otros mtodos anticonceptivos hormonales (por ejemplo, el DIU o parche) pueden no Engineer, petroleum est utilizando Coca-Cola. Utilice un mtodo anticonceptivo adicional durante el tratamiento y por 1 mes despus de la ltima dosis de fosaprepitant. No se debe usar este medicamento continuamente durante [employer]. Visite a su mdico o a su profesional de la salud para chequear su evolucin peridicamente. Este medicamento puede [employer] de las pruebas de sangre de la funcin heptica. Qu efectos secundarios puedo tener al [employer] este medicamento? Efectos secundarios que debe informar a su mdico o a Barrister's clerk de la salud tan pronto como sea posible: -Chief of Staff como erupcin cutnea, picazn o urticarias, hinchazn de la cara, labios o lengua -problemas respiratorios -cambios en el ritmo cardiaco -alta o baja presin sangunea -dolor, enrojecimiento o irritacin en el lugar de la inyeccin -sangrado rectal -mareos, desorientacin o confusin graves -dolor estomacal agudo o severo -dolor agudo en la pierna Efectos secundarios que, por lo general, no requieren atencin mdica (debe informarlos a su mdico o a su profesional de la salud si persisten o si son molestos): -estreimiento o diarrea -prdida del cabello -dolor de cabeza -hipo -prdida del apetito -nuseas -Tree surgeon estomacal -cansancio Puede ser que esta lista no menciona todos los posibles efectos secundarios. Comunquese a su mdico por asesoramiento mdico [employer]. Usted puede informar los efectos secundarios a la FDA por telfono al 1-800-FDA-1088. Dnde debo guardar mi medicina? Este medicamento se  administra en hospitales o clnicas y no necesitar guardarlo en su domicilio. ATENCIN: Este folleto es un resumen. Puede ser que no cubra toda la posible informacin. Si usted tiene preguntas acerca de esta medicina, consulte con su mdico, su farmacutico o su profesional de Technical sales engineer.    2016, Elsevier/Gold Standard. (2014-09-17 00:00:00) Dexamethasone injection Qu es este medicamento? La DEXAMETASONA es un corticosteroide. Se utiliza para tratar la inflamacin de la piel, articulaciones, pulmones y otros rganos. Condiciones comunes tratadas incluyen asma, alergias y artritis. Tambin se [place] para [employer], tales como trastornos sanguneos o enfermedades de la glndula suprarrenal. Este medicamento puede ser utilizado para otros usos; si tiene alguna pregunta consulte con su proveedor de atencin mdica o con su farmacutico. Qu le debo informar a mi profesional de la salud antes de tomar este medicamento? Necesita saber si usted presenta alguno de los siguientes problemas o situaciones: -problemas de coagulacin -[place] de [place] -diabetes -glaucoma -problemas cardacos o enfermedad cardiaca -alta presin sangunea -infecciones, tales como herpes, sarampin, tuberculosis o varicela -enfermedad renal -enfermedad heptica -problemas mentales -miastenia gravis -osteoporosis -ataque cardiaco previo -convulsiones -enfermedad estomacal, intestinal o de la lcera, incluyendo colitis y diverticulitis -problemas tiroideos -una Risk analyst o inusual a la dexametasona, a los corticosteroides, a otros medicamentos, lactosa, alimentos, colorantes o conservantes -si est embarazada o buscando quedar embarazada -si est amamantando a un beb Cmo debo utilizar este medicamento? Este medicamento se administra mediante inyeccin por va intramuscular, en una articulacin, lesin, tejido blando o por va intravenosa. Lo administra un profesional de Technical sales engineer en un hospital  o en un entorno clnico. Hable con su pediatra para informarse acerca del uso de este medicamento en nios. Puede requerir atencin especial. Sobredosis: Pngase en contacto inmediatamente con un  centro toxicolgico o una sala de urgencia si usted cree que haya tomado demasiado medicamento. ATENCIN: [employer] es solo para usted. No comparta este medicamento con nadie. Qu sucede si me olvido de una dosis? No se aplica en este caso. Si debe recibir [place] serie de inyecciones durante un perodo prolongado, trate de no olvidar. Si no puede asistir a una cita, llame al profesional que extiende sus recetas o a su profesional de la salud para pedir Theatre manager. Qu puede interactuar con este medicamento? No tome esta medicina con ninguno de los siguientes medicamentos: -mifepristona, RU-486 -vacunas Esta medicina tambin puede interactuar con los siguientes medicamentos: -anfotericina B -antibiticos, tales como claritromicina, eritromicina y troleandomicina -aspirina o medicamentos tipo aspirina -barbitricos, como fenobarbital -carbamazepina -colestiramina -inhibidores de colinesterasa, tales como donepezil, galantamina, rivastigmina y tacrina -ciclosporina -digoxina -diurticos -efedrina -hormonas femeninas, como estrgenos, progestinas o pldoras anticonceptivas -indinavir -isoniazida -quetoconazol -medicamentos para la diabetes -medicamentos que mejoran el tono o la fuerza muscular para enfermedades como la miastenia gravis -los [place], medicamentos para el dolor o inflamacin, como ibuprofeno o naproxeno -fenitona -rifampicina -talidomida -warfarina Puede ser que esta lista no menciona todas las posibles interacciones. Informe a su profesional de KB Home	[place] de [employer] productos a base de hierbas, medicamentos de [place] o suplementos nutritivos que est tomando. Si usted fuma, consume bebidas alcohlicas o si utiliza drogas ilegales, indqueselo tambin a su profesional de [employer]. Algunas sustancias pueden interactuar con su medicamento. A qu debo estar atento al usar Coca-Cola? Se supervisar su estado de salud atentamente mientras reciba este medicamento. Si toma este medicamento durante un perodo prolongado, lleve consigo una tarjeta de identificacin con su nombre y direccin, el tipo y la dosis del [place], y Academic librarian y la direccin de su mdico. [place] medicamento puede aumentar su riesgo de contraer una infeccin. Trate de no acercarse a personas que estn enfermas. Informe a su mdico o a su profesional de la salud si est en contacto con personas con sarampin o varicela. Si va a someterse a una operacin, informe a su mdico o a su profesional de la salud que ha tomado este [employer] ltimos doce meses. Consulte a su mdico o a su profesional de la salud acerca de su dieta. Tal vez tendr que reducir la cantidad de sal que consume. Este medicamento puede aumentar su nivel de Dispensing optician. Si es diabtico, consulte a su mdico si necesita ayuda para ajustar la dosis de su medicamento para la diabetes. Qu efectos secundarios puedo tener al [employer] este medicamento? Efectos secundarios que debe informar a su mdico o a Barrister's clerk de la salud tan pronto como sea posible: -Chief of Staff como erupcin cutnea, picazn o urticarias, hinchazn de la cara, labios o lengua -heces de color oscuro o con aspecto alquitranado -cambios en el volumen de orina -cambios en la visin -confusin, agitacin, inquietud, falsa sensacin de bienestar -fiebre, dolor de garganta, estornudos, tos u otros signos de infeccin, heridas que no cicatrizan -alucinaciones -aumento de la sed -depresin mental, cambios de humor, sentimientos equivocados de autosuficiencia, sentimientos equivocados de ser [employer] caderas, espalda, costillas, brazos, hombros o piernas -dolor, enrojecimiento o Actor de la  inyeccin -enrojecimiento, formacin de ampollas, descamacin o distensin de la piel, inclusive dentro de la boca -hinchazn del rostro -hinchazn de los pies o de la parte inferior de las piernas -sangrado o magulladuras inusuales -cansancio o debilidad inusual -heridas que no cicatrizan Efectos secundarios que,  por lo general, no requieren atencin mdica (debe informarlos a su mdico o a su profesional de la salud si persisten o si son molestos): -diarrea o estreimiento -cambios en el sentido del gusto -dolor de cabeza -nuseas, vmito -problemas en la piel, acn, piel delgada y brillante -dificultad para conciliar el sueo -crecimiento inusual del pelo o vello en el rostro o cuerpo -aumento de peso Puede ser que esta lista no menciona todos los posibles efectos secundarios. Comunquese a su mdico por asesoramiento mdico [employer]. Usted puede informar los efectos secundarios a la FDA por telfono al 1-800-FDA-1088. Dnde debo guardar mi medicina? Este medicamento se administra en hospitales o clnicas y no necesitar guardarlo en su domicilio. ATENCIN: Este folleto es un resumen. Puede ser que no cubra toda la posible informacin. Si usted tiene preguntas acerca de esta medicina, consulte con su mdico, su farmacutico o su profesional de Technical sales engineer.    2016, Elsevier/Gold Standard. (2014-09-16 00:00:00) Pegfilgrastim injection Qu es este medicamento? El PEGFILGRASTIM es un factor estimulante de colonias de granulocitos de accin prolongada que estimula el crecimiento de los neutrfilos, un tipo de glbulo blanco importante en la lucha del cuerpo contra la infeccin. Se utiliza para reducir la incidencia de la fiebre y la infeccin en pacientes con ciertos tipos de cncer que reciben quimioterapia que afecta la mdula sea, y para aumentar la supervivencia despus de estar expuesto a altas dosis de radiacin. Este medicamento puede ser utilizado para otros  usos; si tiene alguna pregunta consulte con su proveedor de atencin mdica o con su farmacutico. Qu le debo informar a mi profesional de la salud antes de tomar este medicamento? Necesita saber si usted presenta alguno de los siguientes problemas o situaciones: -enfermedad renal -alergia al ltex -si actualmente recibe radioterapia -anemia drepanoctica -reacciones cutneas a Therapist, music acrlicos (On-Body Injector solamente, su nombre en ingls) -una reaccin alrgica o inusual al pegfilgrastim, al filgrastim, a otros medicamentos, alimentos, colorantes o conservantes -si est embarazada o buscando quedar embarazada -si est amamantando a un beb Cmo debo utilizar este medicamento? Este medicamento se administra mediante una inyeccin por va subcutnea. Si recibe Coca-Cola en su domicilio, le ensearn cmo preparar y Architectural technologist la jeringa prellenada o cmo usar Biomedical scientist en el cuerpo (su nombre en ingls, On-body Injector). Consulte las instrucciones de uso para el paciente para obtener instrucciones detalladas. selo exactamente como se le indique. Tome sus dosis a intervalos regulares. No tome su medicamento con una frecuencia mayor a la indicada. Es importante que deseche las agujas y las jeringas usadas en un recipiente resistente a los pinchazos. No las deseche en la basura. Si no tiene un recipiente resistente a los pinchazos, consulte a Midwife o su proveedor de atencin de la salud para obtenerlo. Hable con su pediatra para informarse acerca del uso de este medicamento en nios. Aunque este medicamento se puede recetar para condiciones selectivas, existen precauciones que deben cumplirse. Sobredosis: Pngase en contacto inmediatamente con un centro toxicolgico o una sala de urgencia si usted cree que haya tomado demasiado medicamento. ATENCIN: [employer] es solo para usted. No comparta este medicamento con nadie. Qu sucede si me olvido de una dosis? Es  importante no olvidar ninguna dosis. Comunquese con su mdico o profesional de la salud si se olvida una dosis. Si se olvida una dosis debido a un fallo del Journalist, newspaper cuerpo (su nombre en ingls, On-body Injector). o fugas, una nueva dosis debe ser administrada tan pronto como sea  posible usando una sola jeringa prellenada para uso manual. Qu puede interactuar con este medicamento? No se han estudiado las interacciones. Puede ser que esta lista no menciona todas las posibles interacciones. Informe a su profesional de KB Home	[place] de [employer] productos a base de hierbas, medicamentos de [place] o suplementos nutritivos que est tomando. Si usted fuma, consume bebidas alcohlicas o si utiliza drogas ilegales, indqueselo tambin a su profesional de KB Home	[place]. Algunas sustancias pueden interactuar con su medicamento. A qu debo estar atento al usar Coca-Cola? Usted podr Psychologist, prison and probation services de sangre mientras est usando este medicamento. Si va a someterse a un IRM (MRI), tomografa computarizada u otro procedimiento, informe a su mdico que est usando este medicamento (su nombre en ingls, On-body Injector solamente). Qu efectos secundarios puedo tener al [employer] este medicamento? Efectos secundarios que debe informar a su mdico o a Barrister's clerk de la salud tan pronto como sea posible: -Chief of Staff como erupcin cutnea, picazn o urticarias, hinchazn de la cara, labios o lengua -mareos -fiebre -dolor, enrojecimiento o irritacin en la zona de la inyeccin -puntos rojos en la piel -orina de color roja o marrn oscura -falta de aliento o problemas respiratorios -dolor de Paramedic, dolor en un costado o dolor en el hombro -hinchazn -cansancio -dificultad para orinar o cambios en el volumen de orina Efectos secundarios que, por lo general, no requieren atencin mdica (debe informarlos a su mdico o a Barrister's clerk de la salud si persisten o si son  molestos): -dolor de Scientist, research (physical sciences) -dolor de msculos Puede ser que esta lista no menciona todos los posibles efectos secundarios. Comunquese a su mdico por asesoramiento mdico [employer]. Usted puede informar los efectos secundarios a la FDA por telfono al 1-800-FDA-1088. Dnde debo guardar mi medicina? Mantngala fuera del alcance de los nios. Guarde las jeringas prellenadas en un refrigerador, a una temperatura de [place] 2 y 70 grados C (56 y 23 grados F). No las congele. Mantngalas en el cartn para proteger contra la luz. Si este medicamento se queda fuera del refrigerador durante ms de 48 horas, debe desecharlo. Deseche todo el medicamento que no haya utilizado, despus de la fecha de vencimiento. ATENCIN: Este folleto es un resumen. Puede ser que no cubra toda la posible informacin. Si usted tiene preguntas acerca de esta medicina, consulte con su mdico, su farmacutico o su profesional de Technical sales engineer.    2016, Elsevier/Gold Standard. (2014-12-05 00:00:00) Ondansetron tablets Qu es este medicamento? El ONDANSETRN se [place] para tratar las nuseas y el vmito causados por la quimioterapia. Tambin se puede Risk manager para [place] y el vmito despus de una operacin. Este medicamento puede ser utilizado para otros usos; si tiene alguna pregunta consulte con su proveedor de atencin mdica o con su farmacutico. Qu le debo informar a mi profesional de la salud antes de tomar este medicamento? Necesita saber si usted presenta alguno de los siguientes problemas o situaciones: -enfermedad cardiaca -antecedentes de pulso cardiaco irregular -enfermedad heptica -niveles bajos de magnesio o potasio en la sangre -una reaccin alrgica o inusual al Mendel Ryder, granisetrn, a otros medicamentos, alimentos, colorantes o conservantes -si est embarazada o buscando quedar embarazada -si est amamantando a un beb Cmo debo [employer]? Tome este medicamento por va oral con un vaso de agua. Siga las instrucciones de la etiqueta del [place]. Tome sus dosis a intervalos regulares. No tome su medicamento con una frecuencia mayor que la indicada. Hable con su pediatra  para informarse acerca del uso de este medicamento en nios. Puede requerir atencin especial. Sobredosis: Pngase en contacto inmediatamente con un centro toxicolgico o una sala de urgencia si usted cree que haya tomado demasiado medicamento. ATENCIN: [employer] es solo para usted. No comparta este medicamento con nadie. Qu sucede si me olvido de una dosis? Si olvida una dosis, tmela lo antes posible. Si es casi la hora de la prxima dosis, tome slo esa dosis. No tome dosis adicionales o dobles. Qu puede interactuar con este medicamento? No tome esta medicina con ninguno de los siguientes medicamentos: apomorfina ciertos medicamentos para infecciones micticas, tales como fluconazol, quetoconazol, itraconazol, posaconazol, voriconazol cisapride dofetilida dronedarona pimozida tioridazina ziprasidona Esta medicina tambin puede interactuar con los siguientes medicamentos: carbamazepina ciertos medicamentos para la depresin, ansiedad o trastornos psicticos fentanilo linezolid IMAOs, tales como [place], [place], [place], [place] y Parnate azul de metileno (inyeccin por va intravenosa) otros medicamentos que prolongan el intervalo QT (causa un ritmo cardiaco anormal) fenitona rifampicina tramadol Puede ser que esta lista no menciona todas las posibles interacciones. Informe a su profesional de KB Home	[place] de [employer] productos a base de hierbas, medicamentos de [place] o suplementos nutritivos que est tomando. Si usted fuma, consume bebidas alcohlicas o si utiliza drogas ilegales, indqueselo tambin a su profesional de KB Home	[place]. Algunas sustancias pueden interactuar con su medicamento. A qu debo estar atento al usar [employer]? Si experimenta algn signo de reaccin alrgica, consulte a su mdico o a su profesional de KB Home	[place] lo antes posible. Qu efectos secundarios puedo tener al [employer] este medicamento? Efectos secundarios que debe informar a su mdico o a Barrister's clerk de la salud tan pronto como sea posible: -Chief of Staff como erupcin cutnea, picazn o urticarias, hinchazn de la cara, labios o lengua -problemas respiratorios -confusin -mareos -pulso cardiaco rpido o irregular -sensacin de desmayos o aturdimiento, cadas -fiebre y escalofros -prdida del equilibrio o coordinacin -convulsiones -sudoracin -hinchazn de las manos o pies -opresin en el pecho -temblores -cansancio o debilidad inusual Efectos secundarios que, por lo general, no requieren atencin mdica (debe informarlos a su mdico o a su profesional de la salud si persisten o si son molestos): -estreimiento o diarrea -dolor de cabeza Puede ser que esta lista no menciona todos los posibles efectos secundarios. Comunquese a su mdico por asesoramiento mdico [employer]. Usted puede informar los efectos secundarios a la FDA por telfono al 1-800-FDA-1088. Dnde debo guardar mi medicina? Mantngala fuera del alcance de los nios. Gurdela a una temperatura de [place] 2 y 97 grados C (49 y 73 grados F). Deseche todo el medicamento que no haya utilizado, despus de la fecha de vencimiento. ATENCIN: Este folleto es un resumen. Puede ser que no cubra toda la posible informacin. Si usted tiene preguntas acerca de esta medicina, consulte con su mdico, su farmacutico o su profesional de Technical sales engineer.    2016, Elsevier/Gold Standard. (2014-09-16 00:00:00) Prochlorperazine tablets Qu es este medicamento? La PROCLORPERAZINA ayuda a controlar las nuseas y Theme park manager. Este medicamento tambin se [place] para tratar esquizofrenia. Tambin se [employer] de la ansiedad no  psictica. Este medicamento puede ser utilizado para otros usos; si tiene alguna pregunta consulte con su proveedor de atencin mdica o con su farmacutico. Qu le debo informar a mi profesional de la salud antes de tomar este medicamento? Necesita saber si usted presenta alguno de los siguientes problemas o situaciones: -trastornos o enfermedad sangunea -demencia -enfermedad heptica o ictericia -enfermedad  de Parkinson -trastorno de movimiento incontrolable -una reaccin alrgica o inusual a la proclorperazina, a otros medicamentos, alimentos, colorantes o conservantes -si est embarazada o buscando quedar embarazada -si est amamantando a un beb Cmo debo utilizar este medicamento? Tome este medicamento por va oral con un vaso de agua. Siga las instrucciones de la etiqueta del [place]. Tome sus dosis a intervalos regulares. No tome su medicamento con una frecuencia mayor a la indicada. No suspenda el medicamento de [place] abrupta ya que esto puede causar nuseas, vmito y [place]. Consulte a mdico o a su profesional de la salud por asesoramiento. Hable con su pediatra para informarse acerca del uso de este medicamento en nios. Puede requerir atencin especial. Aunque este medicamento ha sido recetado a nios tan menores como de 2 aos de edad para condiciones selectivas, las precauciones se aplican. Sobredosis: Pngase en contacto inmediatamente con un centro toxicolgico o una sala de urgencia si usted cree que haya tomado demasiado medicamento. ATENCIN: [employer] es solo para usted. No comparta este medicamento con nadie. Qu sucede si me olvido de una dosis? Si olvida una dosis, tmela lo antes posible. Si es casi la hora de la prxima dosis, tome slo esa dosis. No tome dosis adicionales o dobles. Qu puede interactuar con este medicamento? No tome esta medicina con ninguno de los siguientes medicamentos: -amoxapina -antidepresivos, tales como citalopram,  escitalopram, fluoxetina, paroxetina y sertralina -desferroxamina -dofetilida -maprotilina -antidepresivos tricclicos, tales como amitriptilina, clomipramina, imipramina, nortriptilina y otros Esta medicina tambin puede Counselling psychologist con los siguientes medicamentos: -litio -analgsicos -fenitona -propranolol -warfarina Puede ser que esta lista no menciona todas las posibles interacciones. Informe a su profesional de [employer]	[place] de [employer] productos a base de hierbas, medicamentos de [place] o suplementos nutritivos que est tomando. Si usted fuma, consume bebidas alcohlicas o si utiliza drogas ilegales, indqueselo tambin a su profesional de [employer]	[place]. Algunas sustancias pueden interactuar con su medicamento. A qu debo estar atento al usar [employer]? Visite a su mdico o a su profesional de la salud para chequear su evolucin peridicamente. Puede experimentar mareos o somnolencia. No conduzca ni utilice maquinaria ni haga nada que Associate Professor en estado de alerta hasta que sepa cmo le afecta este medicamento. No se siente ni se ponga de pie con rapidez, especialmente si es un paciente de edad avanzada. Esto reduce el riesgo de mareos o [employer]. El alcohol puede interferir con el efecto de este medicamento. Evite consumir bebidas alcohlicas. Este medicamento puede reducir la respuesta de su cuerpo al fro o al calor. Vstase abrigado cuando hace frio y mantngase hidratado cuando hace calor. Si es posible, evite temperaturas extremas como las de saunas, piletas de agua caliente o baos o duchas muy fras o muy calientes o actividades que pueden causar deshidratacin como ejercicio vigoroso. Este medicamento puede aumentar la sensibilidad al sol. Mantngase fuera de Administrator. Si no lo puede evitar, utilice ropa protectora y crema de Photographer. No utilice lmparas solares, camas solares ni cabinas solares. Se le podr secar la boca. Masticar chicle sin azcar, chupar  caramelos duros y tomar agua en abundancia le ayudar a mantener la boca hmeda. Si el problema no desaparece o es severo, consulte a su mdico. Qu efectos secundarios puedo tener al [employer] este medicamento? Efectos secundarios que debe informar a su mdico o a Barrister's clerk de la salud tan pronto como sea posible: -visin borrosa -agrandamiento de las tetillas (hombres) o los pechos (mujeres) -produccin de Midwife en  mujeres que no estn amamantando -dolor en el pecho o pulso cardiaco rpido o irregular -confusin, inquietud -orina de color amarillo oscuro o marrn -difcultad al respirar o para tragar -mareos o desmayos -babeo, temblor, dificultad con los movimientos (caminar arrastrando los pies) o rigidez -fiebre, escalofros, dolor de garganta -movimientos involuntarios o incontrolables de ojos, boca, cabeza, brazos y piernas -convulsiones -Administrator -cansancio o debilidad inusual -sangrado o magulladuras inusuales -color amarillento de los ojos o de la piel Efectos secundarios que, por lo general, no requieren Geophysical data processor (debe informarlos a su mdico o a su profesional de la salud si persisten o si son molestos): -dificultad para Garment/textile technologist -dificultad para conciliar el sueo -dolor de cabeza -disfuncin sexual -erupcin cutnea o picazn Puede ser que esta lista no menciona todos los posibles efectos secundarios. Comunquese a su mdico por asesoramiento mdico [employer]. Usted puede informar los efectos secundarios a la FDA por telfono al 1-800-FDA-1088. Dnde debo guardar mi medicina? Mantngala fuera del alcance de los nios. Gurdela a temperatura ambiente de entre 15 y 102 grados C (53 y 77 grados F). Protjala de la luz. Deseche todo el medicamento que no haya utilizado, despus de la fecha de vencimiento. ATENCIN: Este folleto es un resumen. Puede ser que no cubra toda la posible informacin. Si usted tiene preguntas acerca de esta  medicina, consulte con su mdico, su farmacutico o su profesional de Technical sales engineer.    2016, Elsevier/Gold Standard. (2014-09-16 00:00:00) Lidocaine; Prilocaine cream Qu es este medicamento? La LIDOCAINA; [place] es un anestsico tpico que causa la prdida de la sensibilidad en la piel y en el tejido que la rodea. Se utiliza para adormecer la piel antes de procedimientos o inyecciones. Este medicamento puede ser utilizado para otros usos; si tiene alguna pregunta consulte con su proveedor de atencin mdica o con su farmacutico. Qu le debo informar a mi profesional de la salud antes de tomar este medicamento? Necesita saber si usted presenta alguno de los siguientes problemas o situaciones: -deficiencias de glucosa-6-fosfato -enfermedad cardiaca -enfermedades hepticas o hepatitis -metahemoglobinemia -una reaccin alrgica o inusual a la lidocana, a la prilocana, a otros medicamentos, alimentos, colorantes o conservantes -si est embarazada o buscando quedar embarazada -si est amamantando a un beb Cmo debo utilizar este medicamento? Este medicamento es slo para uso externo sobre la piel. No lo tome por va oral. Siga las instrucciones de la etiqueta del medicamento. Lvese las manos antes y despus de usarlo. No utilice una cantidad mayor o deje el medicamento en contacto con la piel durante ms tiempo que el indicado. No lo aplique a los ojos o las heridas abiertas. Este medicamento puede provocar irritacin o prdida de la visin temporal. Evite el contacto de este medicamento con sus ojos. Si esto ocurre, enjuguelos inmediatamente con abundante agua fra. No toque ni friccione el ojo. Comunquese con su proveedor de atencin mdica inmediatamente. Hable con su pediatra para informarse acerca del uso de este medicamento en nios. Aunque este medicamento se puede recetar para condiciones selectivas, las precauciones se aplican. Sobredosis: Pngase en contacto inmediatamente con un  centro toxicolgico o una sala de urgencia si usted cree que haya tomado demasiado medicamento. ATENCIN: [employer] es solo para usted. No comparta este medicamento con nadie. Qu sucede si me olvido de una dosis? Generalmente este medicamento se aplica una sola vez antes de cada procedimiento. El medicamento debe estar en contacto con la piel durante cierto tiempo para que pueda actuar. Si aplic el medicamento ms tarde  de lo indicado, asegrese de avisarle a su profesional de la salud antes de comenzar con el procedimiento. Qu puede interactuar con este medicamento? -acetaminofeno -cloroquina -dapsona -medicamentos para controlar el ritmo cardiaco -nitratos tales como nitroglicerina y nitroprusiato -otras pomadas, cremas o aerosales que pueden contener medicamento anestsico -fenobarbital -fenitona -quinina -sulfonamidas, tales como sulfacetamida, sulfametoxasol, sulfasalazina y otros Puede ser que esta lista no menciona todas las posibles interacciones. Informe a su profesional de [employer]	[place] de [employer] productos a base de hierbas, medicamentos de [place] o suplementos nutritivos que est tomando. Si usted fuma, consume bebidas alcohlicas o si utiliza drogas ilegales, indqueselo tambin a su profesional de [employer] Home	[place]. Algunas sustancias pueden interactuar con su medicamento. A qu debo estar atento al usar Coca-Cola? Trate de no sufrir lesiones en la zona tratada mientras est entumecida y usted no sienta Conservation officer, historic buildings. Evite rasguar, friccionar o exponer la zona tratada a temperaturas altas o bajas hasta que haya recobrado la sensibilidad por completo. La sensacin de entumecimiento desaparecer unas horas despus de haber aplicado la crema. Qu efectos secundarios puedo tener al [employer] este medicamento? Efectos secundarios que debe informar a su mdico o a Barrister's clerk de la salud tan pronto como sea posible: -visin borrosa -dolor en el pecho -dificultad al  respirar -mareos -somnolencia -pulso cardiaco rpido o irregular -erupcin cutnea o picazn -hinchazn de la garganta, los labios o la cara -temblores Efectos secundarios que, por lo general, no requieren Geophysical data processor (debe informarlos a su mdico o a su profesional de la salud si persisten o si son molestos): -cambios en la capacidad de sentir el fro o el calor -enrojecimiento e hinchazn en el lugar de la aplicacin Puede ser que esta lista no menciona todos los posibles efectos secundarios. Comunquese a su mdico por asesoramiento mdico [employer]. Usted puede informar los efectos secundarios a la FDA por telfono al 1-800-FDA-1088. Dnde debo guardar mi medicina? Mantngala fuera del alcance de los nios. Gurdela a [employer], entre 15 y 79 grados C (37 y 64 grados F). Mantenga el envase bien cerrado. Deseche todo el medicamento que no haya utilizado, despus de la fecha de vencimiento. ATENCIN: Este folleto es un resumen. Puede ser que no cubra toda la posible informacin. Si usted tiene preguntas acerca de esta medicina, consulte con su mdico, su farmacutico o su profesional de Technical sales engineer.    2016, Elsevier/Gold Standard. (2014-09-16 00:00:00) Quimioterapia (Chemotherapy) La quimioterapia es el uso de medicamentos para detener o lentificar el crecimiento de clulas cancerosas. Segn el tipo y el estadio del cncer, puede recibir quimioterapia para:  Veterinary surgeon.  Lentificar el progreso del cncer.  Aliviar los sntomas del cncer.  Mejorar los beneficios del tratamiento de radiacin.  Reducir el tamao del tumor antes de la [place].  Eliminar las clulas cancerosas que permanecen en el cuerpo despus de que el tumor se haya extirpado. [place] SE ADMINISTRA [place]? La quimioterapia se puede administrar de las siguientes maneras:  Por boca, en forma de lquido o comprimido.  A travs de un tubo delgado que se coloca en una  vena o arteria.  Mediante una inyeccin.  Mediante la aplicacin de crema o ungento en la piel.  A travs de lquidos que se administran directamente en varias reas del cuerpo, como el abdomen, el pecho o la vejiga. CON QU FRECUENCIA SE ADMINISTRA LA QUIMIOTERAPIA? La quimioterapia puede administrarse de [place] continua a lo largo de un periodo de tiempo o en ciclos. Por  ejemplo, puede tomar el medicamento durante una semana por mes. [place]? La duracin del tratamiento depende de muchos factores, entre ellos:  El tipo de Hotel manager.  Si el cncer se ha diseminado.  Cmo responde a la quimioterapia.  Si desarrolla efectos secundarios. Algunos tipos de medicamentos para la quimioterapia se administran solo Child psychotherapist. Otros se indican durante meses, aos o de por vida. [place]? Los medicamentos para la quimioterapia son muy fuertes. Estarn presentes en todos los lquidos corporales, como la orina, la materia fecal, la saliva, el sudor, las lgrimas, las secreciones vaginales y Theatre stage manager. Debe seguir cuidadosamente algunas precauciones de seguridad para evitar daar a Microbiologist tome estos medicamentos. A continuacin se mencionan las precauciones recomendadas:  Asegrese de que las personas que cuidan de usted usen guantes desechables si entrarn en contacto con cualquiera de los lquidos corporales. Las mujeres embarazadas o que amamantan no deben manipular ninguno de los lquidos corporales.  [place] y las sbanas que puedan contener lquidos [employer] veces en el lavarropas con agua muy caliente.  Deseche paales de adultos, tampones y toallas sanitarias colocndolos primero en una bolsa de plstico.  Use preservativo cuando mantenga relaciones sexuales durante al menos 2 semanas despus de recibir la quimioterapia.  No comparta  bebidas ni alimentos.  Mantenga los medicamentos de la quimioterapia en sus envases originales. Gurdelos en un lugar alto y [place], fuera del alcance de los nios. No los exponga al calor ni a la humedad. No los coloque en envases con otros tipos de medicamentos.  Deseche los envoltorios de sus medicamentos de quimioterapia colocndolos en una bolsa plstica por separado.  No deseche los medicamentos sobrantes ni los tire en el inodoro. Lleve los medicamentos que no usar al [employer] mdico donde se podrn Comptroller de [place] apropiada.  Siga las indicaciones del mdico para la eliminacin apropiada de agujas, vas intravenosas y otros materiales sanitarios que hayan entrado en contacto con los medicamentos para la quimioterapia.  Si se le proporciona un contenedor de Technical sales engineer, asegrese de comprender las instrucciones de [place].  Lvese bien las manos con agua tibia y jabn despus de usar el bao. Squese las manos con toallas de papel desechables.  Cuando use el inodoro:  Secondary school teacher correr [employer] despus de cada uso, incluso despus de vomitar.  Cierre la tapa del inodoro antes de Field seismologist correr Wellsite geologist. Esto ayuda a evitar salpicaduras.  Los hombres y las mujeres deben sentarse para usar el inodoro. Esto ayuda a evitar salpicaduras. [place]? Los efectos secundarios dependen de varios factores, entre ellos:  El tipo especfico de medicamento para quimioterapia utilizado.  La dosis.  La duracin del tratamiento.  El [place] de salud general. Algunos de los efectos secundarios que puede presentar son los siguientes:  Fatiga y disminucin de Sales promotion account executive.  Prdida del apetito.  Cambios en el sentido del olfato y [place].  Nuseas.  Vmitos.  Estreimiento o diarrea.  Prdida del cabello.  Mayor propensin a las infecciones.  Fcil sangrado.  Llagas en la boca.  Quemazn u hormigueo de las manos y los  pies.  Problemas de memoria.   Esta informacin no tiene Marine scientist el consejo del mdico. Asegrese de hacerle al mdico cualquier pregunta que tenga.   Document Released: 11/10/2008 Document Revised: 08/15/2014 Elsevier Interactive Patient Education [employer].

## 2015-12-08 ENCOUNTER — Ambulatory Visit (HOSPITAL_COMMUNITY): Payer: Self-pay | Admitting: Hematology & Oncology

## 2015-12-08 ENCOUNTER — Encounter (HOSPITAL_COMMUNITY): Payer: MEDICAID | Attending: Hematology & Oncology

## 2015-12-08 ENCOUNTER — Other Ambulatory Visit (HOSPITAL_COMMUNITY): Payer: Self-pay | Admitting: Oncology

## 2015-12-08 DIAGNOSIS — Z79899 Other long term (current) drug therapy: Secondary | ICD-10-CM | POA: Insufficient documentation

## 2015-12-08 DIAGNOSIS — R51 Headache: Secondary | ICD-10-CM | POA: Insufficient documentation

## 2015-12-08 DIAGNOSIS — K219 Gastro-esophageal reflux disease without esophagitis: Secondary | ICD-10-CM | POA: Insufficient documentation

## 2015-12-08 DIAGNOSIS — R1013 Epigastric pain: Secondary | ICD-10-CM | POA: Insufficient documentation

## 2015-12-08 DIAGNOSIS — N644 Mastodynia: Secondary | ICD-10-CM | POA: Insufficient documentation

## 2015-12-08 DIAGNOSIS — C50912 Malignant neoplasm of unspecified site of left female breast: Secondary | ICD-10-CM | POA: Insufficient documentation

## 2015-12-08 DIAGNOSIS — C50112 Malignant neoplasm of central portion of left female breast: Secondary | ICD-10-CM

## 2015-12-08 NOTE — Progress Notes (Signed)
Pt did not show for chemo teaching. Will reschedule.

## 2015-12-08 NOTE — Progress Notes (Signed)
This encounter was created in error - please disregard.

## 2015-12-09 ENCOUNTER — Encounter (HOSPITAL_COMMUNITY): Payer: Self-pay

## 2015-12-09 ENCOUNTER — Encounter (HOSPITAL_COMMUNITY)
Admission: RE | Disposition: A | Discharge status: Home / Self Care | Payer: Self-pay | Source: Ambulatory Visit | Attending: General Surgery

## 2015-12-09 ENCOUNTER — Inpatient Hospital Stay (HOSPITAL_COMMUNITY): Payer: Self-pay | Admitting: Anesthesiology

## 2015-12-09 ENCOUNTER — Observation Stay (HOSPITAL_COMMUNITY)
Admission: RE | Admit: 2015-12-09 | Discharge: 2015-12-11 | Disposition: A | Discharge status: Home / Self Care | Payer: Self-pay | Source: Ambulatory Visit | Attending: General Surgery | Admitting: General Surgery

## 2015-12-09 ENCOUNTER — Observation Stay (HOSPITAL_COMMUNITY): Payer: Self-pay

## 2015-12-09 ENCOUNTER — Inpatient Hospital Stay (HOSPITAL_COMMUNITY): Payer: Self-pay

## 2015-12-09 DIAGNOSIS — K219 Gastro-esophageal reflux disease without esophagitis: Secondary | ICD-10-CM | POA: Insufficient documentation

## 2015-12-09 DIAGNOSIS — C50912 Malignant neoplasm of unspecified site of left female breast: Principal | ICD-10-CM | POA: Insufficient documentation

## 2015-12-09 DIAGNOSIS — Z95828 Presence of other vascular implants and grafts: Secondary | ICD-10-CM

## 2015-12-09 DIAGNOSIS — C773 Secondary and unspecified malignant neoplasm of axilla and upper limb lymph nodes: Secondary | ICD-10-CM | POA: Insufficient documentation

## 2015-12-09 DIAGNOSIS — Z9010 Acquired absence of unspecified breast and nipple: Secondary | ICD-10-CM

## 2015-12-09 HISTORY — PX: MASTECTOMY MODIFIED RADICAL: SHX5962

## 2015-12-09 HISTORY — PX: PORTACATH PLACEMENT: SHX2246

## 2015-12-09 SURGERY — MASTECTOMY, MODIFIED RADICAL
Anesthesia: General | Site: Chest | Laterality: Right

## 2015-12-09 MED ORDER — MIDAZOLAM HCL 2 MG/2ML IJ SOLN
1.0000 mg | INTRAMUSCULAR | Status: DC | PRN
  Administered 2015-12-09: 2 mg via INTRAVENOUS

## 2015-12-09 MED ORDER — HEPARIN SOD (PORK) LOCK FLUSH 100 UNIT/ML IV SOLN
INTRAVENOUS | Status: DC | PRN
  Administered 2015-12-09: 500 [IU] via INTRAVENOUS

## 2015-12-09 MED ORDER — ROCURONIUM BROMIDE 50 MG/5ML IV SOLN
INTRAVENOUS | Status: AC
  Filled 2015-12-09: qty 1

## 2015-12-09 MED ORDER — ACETAMINOPHEN 325 MG PO TABS
650.0000 mg | ORAL_TABLET | Freq: Four times a day (QID) | ORAL | Status: DC | PRN
Start: 2015-12-09 — End: 2015-12-11

## 2015-12-09 MED ORDER — LACTATED RINGERS IV SOLN
INTRAVENOUS | Status: DC
  Administered 2015-12-09 (×2): via INTRAVENOUS

## 2015-12-09 MED ORDER — ONDANSETRON 4 MG PO TBDP
4.0000 mg | ORAL_TABLET | Freq: Four times a day (QID) | ORAL | Status: DC | PRN
  Filled 2015-12-09: qty 1

## 2015-12-09 MED ORDER — DIPHENHYDRAMINE HCL 50 MG/ML IJ SOLN: 25.0000 mg | Freq: Four times a day (QID) | INTRAMUSCULAR | Status: DC | PRN

## 2015-12-09 MED ORDER — KETOROLAC TROMETHAMINE 30 MG/ML IJ SOLN
INTRAMUSCULAR | Status: AC
  Filled 2015-12-09: qty 1

## 2015-12-09 MED ORDER — PROPRANOLOL HCL 20 MG PO TABS
10.0000 mg | ORAL_TABLET | Freq: Two times a day (BID) | ORAL | Status: DC
  Administered 2015-12-09 – 2015-12-11 (×5): 10 mg via ORAL
  Filled 2015-12-09 (×5): qty 1

## 2015-12-09 MED ORDER — 0.9 % SODIUM CHLORIDE (POUR BTL) OPTIME
TOPICAL | Status: DC | PRN
  Administered 2015-12-09: 1000 mL

## 2015-12-09 MED ORDER — MIDAZOLAM HCL 2 MG/2ML IJ SOLN
INTRAMUSCULAR | Status: AC
  Filled 2015-12-09: qty 2

## 2015-12-09 MED ORDER — ONDANSETRON HCL 4 MG/2ML IJ SOLN
INTRAMUSCULAR | Status: AC
  Filled 2015-12-09: qty 2

## 2015-12-09 MED ORDER — ONDANSETRON HCL 4 MG/2ML IJ SOLN
4.0000 mg | Freq: Once | INTRAMUSCULAR | Status: AC | PRN
  Administered 2015-12-09: 4 mg via INTRAVENOUS
  Filled 2015-12-09: qty 2

## 2015-12-09 MED ORDER — CHLORHEXIDINE GLUCONATE 4 % EX LIQD: 1.0000 "application " | Freq: Once | CUTANEOUS | Status: DC

## 2015-12-09 MED ORDER — LIDOCAINE HCL (PF) 1 % IJ SOLN
INTRAMUSCULAR | Status: AC
  Filled 2015-12-09: qty 5

## 2015-12-09 MED ORDER — OXYCODONE-ACETAMINOPHEN 5-325 MG PO TABS
1.0000 | ORAL_TABLET | ORAL | Status: DC | PRN
  Administered 2015-12-09 – 2015-12-10 (×4): 2 via ORAL
  Filled 2015-12-09 (×5): qty 2

## 2015-12-09 MED ORDER — BUPIVACAINE HCL (PF) 0.5 % IJ SOLN
INTRAMUSCULAR | Status: AC
  Filled 2015-12-09: qty 30

## 2015-12-09 MED ORDER — ENOXAPARIN SODIUM 40 MG/0.4ML ~~LOC~~ SOLN
40.0000 mg | Freq: Once | SUBCUTANEOUS | Status: AC
  Administered 2015-12-09: 40 mg via SUBCUTANEOUS
  Filled 2015-12-09: qty 0.4

## 2015-12-09 MED ORDER — FENTANYL CITRATE (PF) 100 MCG/2ML IJ SOLN
INTRAMUSCULAR | Status: DC | PRN
  Administered 2015-12-09: 25 ug via INTRAVENOUS
  Administered 2015-12-09 (×3): 50 ug via INTRAVENOUS

## 2015-12-09 MED ORDER — LIDOCAINE HCL (CARDIAC) 10 MG/ML IV SOLN
INTRAVENOUS | Status: DC | PRN
  Administered 2015-12-09: 50 mg via INTRAVENOUS

## 2015-12-09 MED ORDER — FENTANYL CITRATE (PF) 250 MCG/5ML IJ SOLN
INTRAMUSCULAR | Status: AC
  Filled 2015-12-09: qty 5

## 2015-12-09 MED ORDER — ONDANSETRON HCL 4 MG/2ML IJ SOLN
4.0000 mg | Freq: Four times a day (QID) | INTRAMUSCULAR | Status: DC | PRN
  Administered 2015-12-09 – 2015-12-10 (×3): 4 mg via INTRAVENOUS
  Filled 2015-12-09 (×3): qty 2

## 2015-12-09 MED ORDER — HYDROMORPHONE HCL 1 MG/ML IJ SOLN
1.0000 mg | INTRAMUSCULAR | Status: DC | PRN
  Administered 2015-12-09 – 2015-12-11 (×2): 1 mg via INTRAVENOUS
  Filled 2015-12-09 (×2): qty 1

## 2015-12-09 MED ORDER — ENOXAPARIN SODIUM 40 MG/0.4ML ~~LOC~~ SOLN
40.0000 mg | SUBCUTANEOUS | Status: DC
  Administered 2015-12-10 – 2015-12-11 (×2): 40 mg via SUBCUTANEOUS
  Filled 2015-12-09 (×2): qty 0.4

## 2015-12-09 MED ORDER — CEFAZOLIN SODIUM-DEXTROSE 2-4 GM/100ML-% IV SOLN
2.0000 g | INTRAVENOUS | Status: AC
  Administered 2015-12-09: 2 g via INTRAVENOUS
  Filled 2015-12-09: qty 100

## 2015-12-09 MED ORDER — ROCURONIUM BROMIDE 100 MG/10ML IV SOLN
INTRAVENOUS | Status: DC | PRN
  Administered 2015-12-09: 35 mg via INTRAVENOUS
  Administered 2015-12-09: 5 mg via INTRAVENOUS

## 2015-12-09 MED ORDER — PROPOFOL 10 MG/ML IV BOLUS
INTRAVENOUS | Status: AC
  Filled 2015-12-09: qty 20

## 2015-12-09 MED ORDER — LORAZEPAM 2 MG/ML IJ SOLN: 1.0000 mg | INTRAMUSCULAR | Status: DC | PRN

## 2015-12-09 MED ORDER — POVIDONE-IODINE 10 % OINT PACKET
TOPICAL_OINTMENT | CUTANEOUS | Status: DC | PRN
Start: 2015-12-09 — End: 2015-12-09
  Administered 2015-12-09: 2 via TOPICAL

## 2015-12-09 MED ORDER — NEOSTIGMINE METHYLSULFATE 10 MG/10ML IV SOLN
INTRAVENOUS | Status: DC | PRN
  Administered 2015-12-09: 3 mg via INTRAVENOUS

## 2015-12-09 MED ORDER — DIPHENHYDRAMINE HCL 25 MG PO CAPS: 25.0000 mg | ORAL_CAPSULE | Freq: Four times a day (QID) | ORAL | Status: DC | PRN

## 2015-12-09 MED ORDER — ONDANSETRON HCL 4 MG/2ML IJ SOLN
4.0000 mg | Freq: Once | INTRAMUSCULAR | Status: AC
  Administered 2015-12-09: 4 mg via INTRAVENOUS

## 2015-12-09 MED ORDER — PROPOFOL 10 MG/ML IV BOLUS
INTRAVENOUS | Status: DC | PRN
  Administered 2015-12-09: 50 mg via INTRAVENOUS
  Administered 2015-12-09: 150 mg via INTRAVENOUS

## 2015-12-09 MED ORDER — HEPARIN SOD (PORK) LOCK FLUSH 100 UNIT/ML IV SOLN
INTRAVENOUS | Status: AC
Start: 2015-12-09 — End: 2015-12-09
  Filled 2015-12-09: qty 5

## 2015-12-09 MED ORDER — MENTHOL 3 MG MT LOZG
1.0000 | LOZENGE | OROMUCOSAL | Status: DC | PRN
  Administered 2015-12-09: 3 mg via ORAL
  Filled 2015-12-09: qty 9

## 2015-12-09 MED ORDER — ACETAMINOPHEN 650 MG RE SUPP: 650.0000 mg | Freq: Four times a day (QID) | RECTAL | Status: DC | PRN

## 2015-12-09 MED ORDER — KETOROLAC TROMETHAMINE 30 MG/ML IJ SOLN
30.0000 mg | Freq: Once | INTRAMUSCULAR | Status: AC
  Administered 2015-12-09: 30 mg via INTRAVENOUS

## 2015-12-09 MED ORDER — LACTATED RINGERS IV SOLN
INTRAVENOUS | Status: DC
Start: 2015-12-09 — End: 2015-12-11
  Administered 2015-12-10: 08:00:00 via INTRAVENOUS

## 2015-12-09 MED ORDER — SODIUM CHLORIDE 0.9 % IV SOLN
INTRAVENOUS | Status: DC | PRN
  Administered 2015-12-09: 500 mL via INTRAMUSCULAR

## 2015-12-09 MED ORDER — NEOSTIGMINE METHYLSULFATE 10 MG/10ML IV SOLN
INTRAVENOUS | Status: AC
  Filled 2015-12-09: qty 1

## 2015-12-09 MED ORDER — GLYCOPYRROLATE 0.2 MG/ML IJ SOLN
INTRAMUSCULAR | Status: DC | PRN
  Administered 2015-12-09: 0.6 mg via INTRAVENOUS

## 2015-12-09 MED ORDER — FENTANYL CITRATE (PF) 100 MCG/2ML IJ SOLN: 25.0000 ug | INTRAMUSCULAR | Status: DC | PRN

## 2015-12-09 MED ORDER — GLYCOPYRROLATE 0.2 MG/ML IJ SOLN
INTRAMUSCULAR | Status: AC
  Filled 2015-12-09: qty 3

## 2015-12-09 MED ORDER — POVIDONE-IODINE 10 % EX OINT
TOPICAL_OINTMENT | CUTANEOUS | Status: AC
  Filled 2015-12-09: qty 2

## 2015-12-09 SURGICAL SUPPLY — 58 items
APPLIER CLIP 11 MED OPEN (CLIP) ×3
APPLIER CLIP 9.375 SM OPEN (CLIP) ×6
BAG DECANTER FOR FLEXI CONT (MISCELLANEOUS) ×3 IMPLANT
BAG HAMPER (MISCELLANEOUS) ×3 IMPLANT
BINDER BREAST LRG (GAUZE/BANDAGES/DRESSINGS) ×3 IMPLANT
CHLORAPREP W/TINT 10.5 ML (MISCELLANEOUS) ×3 IMPLANT
CHLORAPREP W/TINT 26ML (MISCELLANEOUS) ×3 IMPLANT
CLIP APPLIE 11 MED OPEN (CLIP) ×2 IMPLANT
CLIP APPLIE 9.375 SM OPEN (CLIP) ×4 IMPLANT
CLOTH BEACON ORANGE TIMEOUT ST (SAFETY) ×3 IMPLANT
COVER LIGHT HANDLE STERIS (MISCELLANEOUS) ×12 IMPLANT
COVER MAYO STAND XLG (DRAPE) ×3 IMPLANT
DECANTER SPIKE VIAL GLASS SM (MISCELLANEOUS) ×3 IMPLANT
DERMABOND ADVANCED (GAUZE/BANDAGES/DRESSINGS) ×1
DERMABOND ADVANCED .7 DNX12 (GAUZE/BANDAGES/DRESSINGS) ×2 IMPLANT
DRAPE C-ARM FOLDED MOBILE STRL (DRAPES) ×3 IMPLANT
DRAPE PROXIMA HALF (DRAPES) ×6 IMPLANT
DRAPE UTILITY W/TAPE 26X15 (DRAPES) ×3 IMPLANT
ELECT REM PT RETURN 9FT ADLT (ELECTROSURGICAL) ×3
ELECTRODE REM PT RTRN 9FT ADLT (ELECTROSURGICAL) ×2 IMPLANT
EVACUATOR DRAINAGE 10X20 100CC (DRAIN) ×2 IMPLANT
EVACUATOR SILICONE 100CC (DRAIN) ×1
GAUZE SPONGE 4X4 12PLY STRL (GAUZE/BANDAGES/DRESSINGS) ×3 IMPLANT
GLOVE BIOGEL M 7.0 STRL (GLOVE) ×3 IMPLANT
GLOVE BIOGEL PI IND STRL 7.0 (GLOVE) ×6 IMPLANT
GLOVE BIOGEL PI INDICATOR 7.0 (GLOVE) ×3
GLOVE ECLIPSE 7.0 STRL STRAW (GLOVE) ×6 IMPLANT
GLOVE EXAM NITRILE MD LF STRL (GLOVE) ×6 IMPLANT
GLOVE SURG SS PI 7.5 STRL IVOR (GLOVE) ×6 IMPLANT
GOWN STRL REUS W/TWL LRG LVL3 (GOWN DISPOSABLE) ×15 IMPLANT
INST SET MINOR GENERAL (KITS) ×3 IMPLANT
IV NS 500ML (IV SOLUTION) ×1
IV NS 500ML BAXH (IV SOLUTION) ×2 IMPLANT
KIT PORT POWER 8FR ISP MRI (Port) ×3 IMPLANT
KIT ROOM TURNOVER APOR (KITS) ×6 IMPLANT
MANIFOLD NEPTUNE II (INSTRUMENTS) ×3 IMPLANT
NEEDLE HYPO 25X1 1.5 SAFETY (NEEDLE) ×3 IMPLANT
NS IRRIG 1000ML POUR BTL (IV SOLUTION) ×3 IMPLANT
PACK MINOR (CUSTOM PROCEDURE TRAY) ×3 IMPLANT
PAD ARMBOARD 7.5X6 YLW CONV (MISCELLANEOUS) ×3 IMPLANT
SET BASIN LINEN APH (SET/KITS/TRAYS/PACK) ×6 IMPLANT
SPONGE DRAIN TRACH 4X4 STRL 2S (GAUZE/BANDAGES/DRESSINGS) ×3 IMPLANT
SPONGE GAUZE 4X4 12PLY (GAUZE/BANDAGES/DRESSINGS) ×3 IMPLANT
SPONGE INTESTINAL PEANUT (DISPOSABLE) ×3 IMPLANT
SPONGE LAP 18X18 X RAY DECT (DISPOSABLE) ×6 IMPLANT
STAPLER VISISTAT (STAPLE) ×3 IMPLANT
SUT ETHILON 3 0 FSL (SUTURE) ×3 IMPLANT
SUT SILK 2 0 (SUTURE) ×1
SUT SILK 2 0 SH (SUTURE) ×3 IMPLANT
SUT SILK 2-0 18XBRD TIE 12 (SUTURE) ×2 IMPLANT
SUT VIC AB 2-0 CT1 27 (SUTURE) ×6
SUT VIC AB 2-0 CT1 TAPERPNT 27 (SUTURE) ×12 IMPLANT
SUT VIC AB 3-0 SH 27 (SUTURE) ×1
SUT VIC AB 3-0 SH 27X BRD (SUTURE) ×2 IMPLANT
SUT VIC AB 4-0 PS2 27 (SUTURE) ×3 IMPLANT
SYR 20CC LL (SYRINGE) ×3 IMPLANT
SYR CONTROL 10ML LL (SYRINGE) ×3 IMPLANT
TAPE CLOTH SURG 4X10 WHT LF (GAUZE/BANDAGES/DRESSINGS) ×3 IMPLANT

## 2015-12-09 NOTE — Transfer of Care (Signed)
Immediate Anesthesia Transfer of Care Note  Patient: Michele Salas  Procedure(s) Performed: Procedure(s): LEFT MODIFIED RADICAL MASTECTOMY (Left) INSERTION PORT-A-CATH RIGHT SUBCLAVIAN (Right)  Patient Location: PACU  Anesthesia Type:General  Level of Consciousness: sedated and patient cooperative  Airway & Oxygen Therapy: Patient Spontanous Breathing and non-rebreather face mask  Post-op Assessment: Report given to RN and Post -op Vital signs reviewed and stable  Post vital signs: Reviewed and stable  Last Vitals:  Filed Vitals:   12/09/15 0725 12/09/15 0730  BP: 140/80 124/76  Pulse:    Temp:    Resp: 25 20    Last Pain: There were no vitals filed for this visit.    Patients Stated Pain Goal: 4 (74/[id number]/40 [id number])  Complications: No apparent anesthesia complications

## 2015-12-09 NOTE — Op Note (Signed)
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  Anes:  Gen. endotracheal  Indications:  Patient is a 35 year old Hispanic female who since for surgery. She has biopsy-proven left breast carcinoma centrally along with multiple metastatic lymph nodes in the left axilla. She is really been seen by oncology. She is about to undergo a left modified radical mastectomy with Port-A-Cath insertion. The risks and benefits of the procedures including bleeding, infection, cardiopulmonary difficulties, and the possibility of a pneumothorax were fully explained to the patient through an interpreter, who gave informed consent.  Procedure note:  The patient was placed the supine position. After induction of general endotracheal anesthesia, the left breast and axilla were prepped and draped using usual sterile technique with DuraPrep. Surgical site confirmation was performed.  An elliptical incision was made medial to lateral around the left nipple. A superior flap was then formed to the clavicle and an inferior flap formed to the chest wall. The breast was then removed medial to lateral off the pectoralis major muscle using Bovie electrocautery. A suture was placed superiorly for orientation purposes. A level II left axillary dissection was then performed. Care was taken to avoid the thoracodorsal artery vein and nerve and long thoracic nerve. The intercostal brachial nerve was also left intact. Multiple matted lymph nodes were found. Any bleeding was controlled using small clips. The left breast and axillary contents were then removed from the operative field and sent to pathology further examination. The wound was irrigated normal saline. Any bleeding was controlled using Bovie  electrocautery. A #10 flat Jackson-Pratt drain was placed along the flap and brought through separate stab wound inferior to the incision line. It was secured at the skin level using 3-0 nylon interrupted suture. The subcutaneous layer was reapproximated using 2-0 Vicryl interrupted sutures. The skin was closed using staples. Betadine ointment and dry sterile dressings were applied.  Next, a Port-A-Cath was inserted in the right subclavian vein. Surgical site confirmation was performed. An incision was made below the right clavicle. A subcutaneous pocket was then formed. The patient was placed in Trendelenburg position. A needle was placed into the right subclavian vein using the Seldinger technique. A guidewire was then inserted and confirmation of placement was done under digital fluoroscopy. An introducer peel-away sheath were placed over the guidewire. The catheter was then inserted into the peel-away sheath the peel-away sheath was removed. The catheter was then attached to the port and the port placed in subcutaneous pocket. A power port was inserted. Good backflow blood was noted in the port. The port was flushed with heparin flush. Adequate positioning was confirmed by fluoroscopy. The subcutaneous layer was reapproximated using 3-0 Vicryl interrupted suture. The skin was closed using a 4-0 Vicryl subcuticular suture. Dermabond was then applied.  All tape and needle counts were correct the end of the procedure. The patient was extubated in the operating room and transferred to PACU in stable condition.  Complications:  None  EBL:  100 mL  Specimen:  Left breast and axillary contents, suture superior  Drains: Jackson-Pratt drain to flap

## 2015-12-09 NOTE — Anesthesia Procedure Notes (Signed)
Procedure Name: Intubation Date/Time: 12/09/2015 7:47 AM Performed by: Vista Deck Pre-anesthesia Checklist: Patient identified, Patient being monitored, Timeout performed, Emergency Drugs available and Suction available Patient Re-evaluated:Patient Re-evaluated prior to inductionOxygen Delivery Method: Circle System Utilized Preoxygenation: Pre-oxygenation with 100% oxygen Intubation Type: IV induction, Rapid sequence and Cricoid Pressure applied Ventilation: Mask ventilation without difficulty Laryngoscope Size: Miller and 2 Grade View: Grade I Tube type: Oral Tube size: 7.0 mm Number of attempts: 1 Airway Equipment and Method: Stylet and Oral airway Placement Confirmation: ETT inserted through vocal cords under direct vision,  positive ETCO2 and breath sounds checked- equal and bilateral Secured at: 21 cm Tube secured with: Tape Dental Injury: Teeth and Oropharynx as per pre-operative assessment

## 2015-12-09 NOTE — Interval H&P Note (Signed)
History and Physical Interval Note:  12/09/2015 7:19 AM  Michele Salas  has presented today for surgery, with the diagnosis of left breast cancer  The various methods of treatment have been discussed with the patient and family. After consideration of risks, benefits and other options for treatment, the patient has consented to  Procedure(s) with comments: [place] (Left) - interpreter scheduled, do NOT move INSERTION PORT-A-CATH (Right) as a surgical intervention .  The patient's history has been reviewed, patient examined, no change in status, stable for surgery.  I have reviewed the patient's chart and labs.  Questions were answered to the patient's satisfaction.     Aviva Signs A

## 2015-12-09 NOTE — Anesthesia Preprocedure Evaluation (Addendum)
Anesthesia Evaluation  Patient identified by MRN, date of birth, ID band Patient awake    Reviewed: Allergy & Precautions, NPO status , Patient's Chart, lab work & pertinent test results  Airway Mallampati: II  TM Distance: >3 FB     Dental  (+) Teeth Intact, Dental Advisory Given   Pulmonary neg pulmonary ROS,    breath sounds clear to auscultation       Cardiovascular negative cardio ROS   Rhythm:Regular Rate:Normal     Neuro/Psych  Headaches,    GI/Hepatic GERD  Controlled,  Endo/Other    Renal/GU      Musculoskeletal   Abdominal   Peds  Hematology  (+) anemia ,   Anesthesia Other Findings   Reproductive/Obstetrics                            Anesthesia Physical Anesthesia Plan  ASA: II  Anesthesia Plan: General   Post-op Pain Management:    Induction: Intravenous, Rapid sequence and Cricoid pressure planned  Airway Management Planned: Oral ETT  Additional Equipment:   Intra-op Plan:   Post-operative Plan: Extubation in OR  Informed Consent: I have reviewed the patients History and Physical, chart, labs and discussed the procedure including the risks, benefits and alternatives for the proposed anesthesia with the patient or authorized representative who has indicated his/her understanding and acceptance.     Plan Discussed with:   Anesthesia Plan Comments:         Anesthesia Quick Evaluation

## 2015-12-09 NOTE — Anesthesia Postprocedure Evaluation (Signed)
Anesthesia Post Note  Patient: Michele Salas  Procedure(s) Performed: Procedure(s) (LRB): LEFT MODIFIED RADICAL MASTECTOMY (Left) INSERTION PORT-A-CATH RIGHT SUBCLAVIAN (Right)  Patient location during evaluation: PACU Anesthesia Type: General Level of consciousness: awake and patient cooperative Pain management: pain level controlled Vital Signs Assessment: post-procedure vital signs reviewed and stable Respiratory status: spontaneous breathing Cardiovascular status: blood pressure returned to baseline Anesthetic complications: no Comments: Nausea in PACU, relieved with Zofran.  Denies complaints(confirmed with translator) prior to leaving PACU.    Last Vitals:  Filed Vitals:   12/09/15 1015 12/09/15 1030  BP: 137/92 135/58  Pulse: 63 66  Temp:    Resp: 13 17    Last Pain:  Filed Vitals:   12/09/15 1040  PainSc: Asleep                 Drucie Opitz

## 2015-12-10 ENCOUNTER — Encounter (HOSPITAL_COMMUNITY): Payer: Self-pay | Admitting: General Surgery

## 2015-12-10 LAB — BASIC METABOLIC PANEL
Anion gap: 7 (ref 5–15)
BUN: 9 mg/dL (ref 6–20)
CO2: 26 mmol/L (ref 22–32)
Calcium: 8.7 mg/dL — ABNORMAL LOW (ref 8.9–10.3)
Chloride: 106 mmol/L (ref 101–111)
Creatinine, Ser: 0.49 mg/dL (ref 0.44–1.00)
GFR calc Af Amer: >60 mL/min (ref >60)
GFR calc non Af Amer: >60 mL/min (ref >60)
Glucose, Bld: 110 mg/dL — ABNORMAL HIGH (ref 65–99)
Potassium: 3.6 mmol/L (ref 3.5–5.1)
Sodium: 139 mmol/L (ref 135–145)

## 2015-12-10 LAB — CBC
HCT: 34.5 % — ABNORMAL LOW (ref 36.0–46.0)
Hemoglobin: 11.5 g/dL — ABNORMAL LOW (ref 12.0–15.0)
MCH: 27.1 pg (ref 26.0–34.0)
MCHC: 33.3 g/dL (ref 30.0–36.0)
MCV: 81.4 fL (ref 78.0–100.0)
Platelets: 303 10*3/uL (ref 150–400)
RBC: 4.24 MIL/uL (ref 3.87–5.11)
RDW: 20.8 % — ABNORMAL HIGH (ref 11.5–15.5)
WBC: 8.4 10*3/uL (ref 4.0–10.5)

## 2015-12-10 NOTE — Addendum Note (Signed)
Addendum  created 12/10/15 5329 by Charmaine Downs, CRNA   Modules edited: Clinical Notes   Clinical Notes:  File: [id number]

## 2015-12-10 NOTE — Evaluation (Signed)
Physical Therapy Evaluation Patient Details Name: Michele Salas MRN: [id number] DOB: 1980/10/17 Today's Date: 12/10/2015   History of Present Illness  This 35 year old female presents for of a left breast cancer. 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 Pt is s/p L modified radical mastectomy on 12/09/2015 for invasive ductal carcinoma with (+) axillary lymph nodes.   Clinical Impression  Pt received in bed, and was agreeable to PT evaluation.  Interpretive services utilized.  Pt is normally independent with ADL's, and IADL's, and has been working washing windows.  Reviewed post-op mastectomy precautions with pt, as well as exercises including self assisted shoulder flexion to 90*, scapular retraction, self assisted shoulder abduction to 90*, and pec stretch.  Pt tolerated all exercises well with minimal pain of 3/10.  Handout with exercises and information regarding class given, however, only English version available.  Recommend continued OPPT for strength, AROM, and edema management.       Follow Up Recommendations Outpatient PT    Equipment Recommendations       Recommendations for Other Services       Precautions / Restrictions Precautions Precautions: Shoulder Type of Shoulder Precautions: no shoulder flexion past 90*, and keep L UE elevated on pillows while resting to reduce swelling and edema.  Precaution Booklet Issued: Yes (comment) Precaution Comments: English version given - unable to locate a spanish version.  Restrictions Weight Bearing Restrictions: No      Mobility  Bed Mobility Overal bed mobility: Independent                Transfers Overall transfer level: Independent Equipment used: None                Ambulation/Gait Ambulation/Gait assistance: Supervision Ambulation Distance (Feet): 20 Feet Assistive device: None Gait Pattern/deviations: WFL(Within Functional Limits)         Stairs            Wheelchair Mobility    Modified Rankin (Stroke Patients Only)       Balance Overall balance assessment: Independent                                           Pertinent Vitals/Pain Pain Assessment: 0-10 Pain Score: 3  Pain Location: Chest/L UE Pain Descriptors / Indicators: Other (Comment) (surgical pain) Pain Intervention(s): Limited activity within patient's tolerance;Monitored during session;Repositioned    Home Living   Living Arrangements: Spouse/significant other Available Help at Discharge: Friend(s) Type of Home: House Home Access: Stairs to enter Entrance Stairs-Rails: None Entrance Stairs-Number of Steps: 4 Home Layout: One level   Additional Comments: Pt's husband is going to be staying with her at the house for 3 days, and then a friend will be staying with her during the day.     Prior Function Level of Independence: Independent         Comments: Pt was working as a Curator.      Hand Dominance        Extremity/Trunk Assessment   Upper Extremity Assessment: LUE deficits/detail;RUE deficits/detail RUE Deficits / Details: WFL     LUE Deficits / Details: L shoulder limited due to post-operative restrictions with no elevation >90* for the first week.    Lower Extremity Assessment: Overall WFL for tasks assessed         Communication   Communication: Interpreter  utilized;Other (comment) (Interpreter [phone] via telephonic interpreting)  Cognition Arousal/Alertness: Awake/alert Behavior During Therapy: WFL for tasks assessed/performed Overall Cognitive Status: Within Functional Limits for tasks assessed                      General Comments      Exercises Shoulder Exercises Shoulder Flexion: AAROM;Self ROM;Both;5 reps Shoulder ABduction: AAROM;Self ROM;Left;5 reps;Other (comment) (wall ladder) Other Exercises Other Exercises: scapular retraction x 5 reps with 3 sec  hold Other Exercises: pec stretch x 5 reps with 3 sec hold Positioning of UE while sleeping:  (Educated pt on positioning with pillows for elevation. )            Assessment/Plan    PT Assessment Patient needs continued PT services  PT Diagnosis Acute pain;Generalized weakness   PT Problem List Decreased strength;Decreased range of motion;Decreased activity tolerance;Decreased skin integrity  PT Treatment Interventions Therapeutic activities;Therapeutic exercise;Manual techniques;Patient/family education   PT Goals (Current goals can be found in the Care Plan section) Acute Rehab PT Goals Patient Stated Goal: To get back to work PT Goal Formulation: With patient Time For Goal Achievement: 12/24/15 Potential to Achieve Goals: Good    Frequency Min 2X/week   Barriers to discharge        Co-evaluation               End of Session Equipment Utilized During Treatment: Gait belt Activity Tolerance: Patient tolerated treatment well Patient left: in chair;with call bell/phone within reach      Functional Assessment Tool Used: Clinical Judgement Functional Limitation: Self care Self Care Current Status (Z2080): At least 1 percent but less than 20 percent impaired, limited or restricted Self Care Goal Status (E2336): 0 percent impaired, limited or restricted    Time: 1224-4975 PT Time Calculation (min) (ACUTE ONLY): 31 min   Charges:   PT Evaluation $PT Eval Low Complexity: 1 Procedure PT Treatments $Therapeutic Exercise: 8-22 mins   PT G Codes:   PT G-Codes **NOT FOR INPATIENT CLASS** Functional Assessment Tool Used: Clinical Judgement Functional Limitation: Self care Self Care Current Status (P0051): At least 1 percent but less than 20 percent impaired, limited or restricted Self Care Goal Status (T0211): 0 percent impaired, limited or restricted    [name], PT, DPT X: [phone]   12/10/2015, 10:58 AM

## 2015-12-10 NOTE — Anesthesia Postprocedure Evaluation (Signed)
Anesthesia Post Note  Patient: Michele Salas  Procedure(s) Performed: Procedure(s) (LRB): LEFT MODIFIED RADICAL MASTECTOMY (Left) INSERTION PORT-A-CATH RIGHT SUBCLAVIAN (Right)  Patient location during evaluation: Nursing Unit Anesthesia Type: General Level of consciousness: awake and alert and patient cooperative Pain management: pain level controlled Vital Signs Assessment: post-procedure vital signs reviewed and stable Respiratory status: spontaneous breathing, nonlabored ventilation and respiratory function stable Cardiovascular status: blood pressure returned to baseline Postop Assessment: adequate PO intake and no signs of nausea or vomiting Anesthetic complications: no    Last Vitals:  Filed Vitals:   12/10/15 0500 12/10/15 0900  BP: 128/62 127/57  Pulse: 69 74  Temp: 36.8 C 36.8 C  Resp: 19 16    Last Pain:  Filed Vitals:   12/10/15 0916  PainSc: Asleep                 Jamonte Curfman J

## 2015-12-10 NOTE — Progress Notes (Signed)
1 Day Post-Op  Subjective: Patient with moderate incisional pain. Denies any tingling in the left arm.  Objective: Vital signs in last 24 hours: Temp:  [97.6 F (36.4 C)-98.8 F (37.1 C)] 98.2 F (36.8 C) (05/04 0900) Pulse Rate:  [62-88] 74 (05/04 0900) Resp:  [13-20] 16 (05/04 0900) BP: (110-142)/(52-92) 127/57 mmHg (05/04 0900) SpO2:  [98 %-100 %] 98 % (05/04 0900) Weight:  [65.318 kg (144 lb)] 65.318 kg (144 lb) (05/03 1114) Last BM Date: 12/08/15  Intake/Output from previous day: 05/03 0701 - 05/04 0700 In: 2252.5 [P.O.:240; I.V.:2012.5] Out: 825 [Urine:600; Drains:125; Blood:100] Intake/Output this shift: Total I/O In: 240 [P.O.:240] Out: -   General appearance: alert, cooperative and no distress Resp: clear to auscultation bilaterally Breasts: Left mastectomy dressing dry and intact. JP drainage serosanguineous in nature. Cardio: regular rate and rhythm, S1, S2 normal, no murmur, click, rub or gallop  Lab Results:   Recent Labs  12/10/15 0507  WBC 8.4  HGB 11.5*  HCT 34.5*  PLT 303   BMET  Recent Labs  12/10/15 0507  NA 139  K 3.6  CL 106  CO2 26  GLUCOSE 110*  BUN 9  CREATININE 0.49  CALCIUM 8.7*   PT/INR No results for input(s): LABPROT, INR in the last 72 hours.  Studies/Results: Dg Chest Port 1 View  12/09/2015  CLINICAL DATA:  Port-A-Cath insertion. New history of breast cancer. EXAM: PORTABLE CHEST - 1 VIEW; DG C-ARM 1-60 MIN-NO REPORT COMPARISON:  12/04/2015 FINDINGS: Power port appears in good position. No pneumothorax. Lungs are clear. Heart size and vascularity are normal. Surgical drain in the left anterior chest from recent mastectomy. IMPRESSION: Power port in good position.  Clear lungs.  No pneumothorax. Electronically Signed   By: Lorriane Shire M.D.   On: 12/09/2015 12:13   Dg C-arm 1-60 Min-no Report  12/09/2015  CLINICAL DATA: left breast cancer C-ARM 1-60 MINUTES Fluoroscopy was utilized by the requesting physician.  No  radiographic interpretation.    Anti-infectives: Anti-infectives    Start     Dose/Rate Route Frequency Ordered Stop   12/09/15 0619  ceFAZolin (ANCEF) IVPB 2g/100 mL premix     2 g 200 mL/hr over 30 Minutes Intravenous On call to O.R. 12/09/15 0619 12/09/15 0749      Assessment/Plan: s/p Procedure(s): LEFT MODIFIED RADICAL MASTECTOMY INSERTION PORT-A-CATH RIGHT SUBCLAVIAN Impression: Stable, status post left modified radical mastectomy, Port-A-Cath insertion.  Plan: To receive physical therapy today. Will increase ambulation. Anticipate discharge in next 24-48 hours.  LOS: 1 day    Edla Para A 12/10/2015

## 2015-12-11 LAB — BASIC METABOLIC PANEL
Anion gap: 6 (ref 5–15)
BUN: 8 mg/dL (ref 6–20)
CO2: 25 mmol/L (ref 22–32)
Calcium: 8.7 mg/dL — ABNORMAL LOW (ref 8.9–10.3)
Chloride: 108 mmol/L (ref 101–111)
Creatinine, Ser: 0.54 mg/dL (ref 0.44–1.00)
GFR calc Af Amer: >60 mL/min (ref >60)
GFR calc non Af Amer: >60 mL/min (ref >60)
Glucose, Bld: 97 mg/dL (ref 65–99)
Potassium: 3.5 mmol/L (ref 3.5–5.1)
Sodium: 139 mmol/L (ref 135–145)

## 2015-12-11 LAB — CBC
HCT: 34.2 % — ABNORMAL LOW (ref 36.0–46.0)
Hemoglobin: 11.2 g/dL — ABNORMAL LOW (ref 12.0–15.0)
MCH: 26.9 pg (ref 26.0–34.0)
MCHC: 32.7 g/dL (ref 30.0–36.0)
MCV: 82.2 fL (ref 78.0–100.0)
Platelets: 290 10*3/uL (ref 150–400)
RBC: 4.16 MIL/uL (ref 3.87–5.11)
RDW: 20.8 % — ABNORMAL HIGH (ref 11.5–15.5)
WBC: 7.2 10*3/uL (ref 4.0–10.5)

## 2015-12-11 MED ORDER — OXYCODONE-ACETAMINOPHEN 5-325 MG PO TABS: 1.0000 | ORAL_TABLET | ORAL | Status: DC | PRN

## 2015-12-11 NOTE — Progress Notes (Signed)
Pt's IV catheter removed and intact. Pt's IV site clean dry and intact. Discharge instructions, medications and follow up appointments were reviewed and discussed with patient's husband. Pt's husband verbalized understanding of discharge instructions including medications, care of JP drain and follow up appointments. All questions were answered and no further questions at this time. Pt in stable condition and in no acute distress at time of discharge. Pt escorted by nurse tech.

## 2015-12-11 NOTE — Care Management Note (Signed)
Case Management Note  Patient Details  Name: Michele Salas MRN: [id number] Date of Birth: 08-19-1980  Subjective/Objective:Spoke with patient for discharge planning after discussing cae with Dr Arnoldo Morale. Patient will need  JP drain teaching reinforced with [facility] RN. Contacted Romualdo Bolk at [facility] who will arrange Rn visit to home to reinforce teaching pro bono as patient is uninsured. Patient has limited [place] and evidentally huspand speaks english.  Will contact Husband.  Coupon for pain medication left in room with patient who did express understanding. Discussed with patient nurse Lattie Haw who stated she is reinforcing JP drain teaching.                         Action/Plan:  Home with self care.   Expected Discharge Date:                  Expected Discharge Plan:  Home/Self Care  In-House Referral:     Discharge planning Services  CM Consult, Medication Assistance  Post Acute Care Choice:    Choice offered to:     DME Arranged:    DME Agency:     HH Arranged:  RN [place] Agency:  [place]  Status of Service:  Completed, signed off  Medicare Important Message Given:    Date Medicare IM Given:    Medicare IM give by:    Date Additional Medicare IM Given:    Additional Medicare Important Message give by:     If discussed at [place] of Stay Meetings, dates discussed:    Additional Comments:  Alvie Heidelberg, RN 12/11/2015, 9:58 AM

## 2015-12-11 NOTE — Discharge Instructions (Signed)
Cuidados en el hogar del drenaje con bulbo (Bulb [facility]) Un drenaje con bulbo consiste en un tubo delgado de goma y un bulbo blando, redondo que crea una succin [place]. El tubo de goma se coloca en la zona donde le practicaron la [place]. El bulbo est unido al extremo del tubo y est fuera del cuerpo. El drenaje con bulbo elimina el exceso de lquido que normalmente se acumula en una herida quirrgica despus de la [place]. El color y la cantidad de lquido pueden variar. Inmediatamente despus de la [place], el lquido es de color rojo brillante y es un poco ms espeso que el agua. Puede cambiar gradualmente a un color amarillo o rosa y volverse ms lquido, similar al agua. En general, si la cantidad disminuye a aproximadamente 1o 2cucharadas en 24horas, el mdico le retirar el drenaje. CUIDADOS DIARIOS  Mantenga el bulbo aplanado (comprimido) en todo momento, excepto cuando deba vaciarlo. Al estar plano se crea la succin. Puede aplanar el bulbo apretndolo firmemente en el medio, cerrando luego el tapn.  Mantenga la piel del lugar en el que el tubo entra en la piel seca y [place] con un vendaje (apsito).  Sostenga el tubo con [place], a 1o 2pulgadas (2,5 a 5,1cm) por debajo del lugar de la insercin para que no tire de los puntos. El tubo est suturado en su lugar y no se saldr.  Ajuste el bulbo como le indic el mdico.  Los primeros 3 das despus de la ciruga, habr ms lquido en el bulbo. Vace el bulbo cada vez que se haya llenado hasta la mitad, debido a que el bulbo no crea la suficiente succin si est demasiado lleno. El bulbo tambin podra desbordarse. Anote la cantidad de lquido que retira cada vez que vace el drenaje. Sume la cantidad que retira en 24 horas.  Vace el bulbo a la misma hora todos los das una vez que disminuya la cantidad de lquido y solo tenga que vaciarlo una vez al da. Anote las cantidades y los totales de 24horas para informar a  su mdico. Esto ayuda a su mdico a saber cundo se pueden Chief Strategy Officer tubos. VACIADO DEL DRENAJE CON BULBO Antes de vaciar el bulbo, consiga una taza [place], un trozo de papel, Ardelia Mems lapicera y [employer].  Pase suavemente los dedos por el tubo (barrido) para Chief of Staff drenaje que se encuentra en el tubo hacia el bulbo. Tal vez necesite repetir esta operacin varias veces por da para eliminar los cogulos y tejidos del tubo.  Abra la tapa del bulbo para liberar la succin, con lo cual se inflar. No toque el interior de la tapa.  Pase suavemente los dedos por el tubo (barrido) para Chief of Staff drenaje que se encuentra en el tubo hacia el bulbo.  Mantenga la tapa fuera y vierta el lquido en un recipiente medidor.  Apriete el bulbo para hacer succin.  Vuelva a colocar la tapa.  Controle la cinta adhesiva que sujeta el tubo a la piel. Cuando se afloja, quite el trozo suelto de cinta y [employer]. Luego fije el bulbo a su camisa.  Anote la cantidad de lquido que elimin. Anote la fecha cada vez que vace el drenaje del bulbo. (Si hay 2 bulbos, tenga en cuenta la cantidad de drenaje de cada uno y [place] los totales separados. El mdico querr saber las cantidades totales de cada tubo de drenaje y cul es el que drena ms).  Tire el lquido por el  inodoro y [employer].  Llame a su mdico cuando tenga menos de 2cucharadas de lquido acumulado en el bulbo de drenaje durante 24horas. Si hay drenaje alrededor del sitio del tubo, cambie los vendajes y [place] de los [place] el rea seca. [place] alrededor del tubo con solucin salina estril y coloque una gasa seca alrededor del sitio. Esta gasa debe cambiarse cuando est sucia. Si se mantiene limpio y sin suciedad, igualmente debe cambiarlo a diario.  SOLICITE ATENCIN MDICA SI:  El drenaje tiene mal olor o est turbio.  Tiene fiebre.  El drenaje aumenta en lugar de disminuir.  El tubo se cae.  Tiene hinchazn o enrojecimiento  alrededor del tubo.  Tiene un drenaje en [place].  El bulbo no se queda plano despus de vaciarlo. ASEGRESE DE QUE:   Comprende estas instrucciones.  Controlar su afeccin.  Recibir ayuda de inmediato si no mejora o si empeora.   Esta informacin no tiene Marine scientist el consejo del mdico. Asegrese de hacerle al mdico cualquier pregunta que tenga.   Document Released: 07/25/2005 Document Revised: 05/15/2013 Elsevier Interactive Patient Education 2016 [place] radical modificada o total, cuidados posteriores (Total or Modified Radical Mastectomy, Care After) Siga estas instrucciones durante las prximas semanas. Estas indicaciones le proporcionan informacin acerca de cmo deber cuidarse despus del procedimiento. El mdico tambin podr darle instrucciones ms especficas. El tratamiento ha sido planificado segn las prcticas mdicas actuales, pero en algunos casos pueden ocurrir problemas. Comunquese con el mdico si tiene algn problema o tiene dudas despus del procedimiento. QU ESPERAR DESPUS DEL PROCEDIMIENTO Despus del procedimiento, es comn [employer] siguientes sntomas:  Social research officer, government.  Entumecimiento.  Rigidez en el brazo o el hombro.  Sensacin de estrs, tristeza o depresin. Si le extrajeron los ganglios linfticos de la axila, puede tener hinchazn, debilidad o entumecimiento en el brazo del lado donde le practicaron la [place]. INSTRUCCIONES PARA EL CUIDADO EN EL HOGAR Cuidado de la incisin  Hay muchas maneras distintas de cerrar y cubrir una incisin, como puntos, pegamento para la piel y tiras [place]. Siga las instrucciones del mdico con respecto a lo siguiente:  Careers adviser herida.  Cambiar y Charity fundraiser el vendaje.  Quitar el cierre de la incisin.  [place] zona de la incisin para detectar signos de infeccin. Est atenta a lo siguiente:  Dolor, hinchazn o enrojecimiento.  Lquido, sangre o  pus.  Si la enviaron de regreso a su casa con un drenaje quirrgico colocado, siga las instrucciones del mdico para vaciarlo. El bao  No tome baos de inmersin, no nade ni use el jacuzzi hasta que el mdico lo autorice.  Tome baos con esponja hasta que el mdico la autorice a ducharse o a Teacher, early years/pre de inmersin. Actividades  Reanude sus actividades normales como se lo haya indicado el mdico.  Evite el ejercicio fsico intenso.  Evite cualquier actividad que pueda causarle una lesin en el brazo que est del lado de la [place].  No levante ningn objeto que pese ms de 10libras (4,5kg). Evite levantar objetos con el brazo que est del lado de la [place].  No cargue objetos pesados [employer].  Despus de que le saquen el drenaje, debe realizar ejercicios para evitar que el brazo se entumezca e hinche. Consulte al [employer] tipos de ejercicios que son seguros para usted. Instrucciones generales  [employer] solamente como se lo haya indicado el mdico.  Puede seguir su dieta habitual.  Mantenga el brazo  elevado al descansar.  No use anillos, pulseras ni otros accesorios ajustados en el brazo, la [place] o los dedos del lado de la [place].  Si le hicieron Nutritional therapist radical modificada, infrmeles siempre a los mdicos que le extrajeron los ganglios linfticos de la axila. Esta es una informacin importante que debe proporcionar antes de ciertos procedimientos, como una extraccin de sangre para donar o la medicin de la presin arterial. SOLICITE ATENCIN MDICA SI:  Jaclynn Guarneri.  El medicamento para Glass blower/designer no le hace efecto.  La hinchazn, la debilidad o el entumecimiento del brazo no han mejorado despus de unas semanas.  Tiene una hinchazn nueva en la mama o el brazo.  Tiene enrojecimiento, hinchazn o dolor en la zona de la incisin.  Observa lquido, sangre o pus que emanan de la incisin. SOLICITE ATENCIN MDICA DE  INMEDIATO SI:  Tiene un dolor muy intenso en la mama o el brazo.  Siente dolor en el pecho.  Tiene dificultad para respirar.   Esta informacin no tiene Marine scientist el consejo del mdico. Asegrese de hacerle al mdico cualquier pregunta que tenga.   Document Released: 09/05/2006 Document Revised: 08/15/2014 Elsevier Interactive Patient Education [employer].

## 2015-12-11 NOTE — Progress Notes (Signed)
Patient did not meet [facility]  charity requirements at this time.

## 2015-12-11 NOTE — Care Management (Signed)
Spoke with patients husband who speaks English at the bedside. He expressed understanding of discharge plan. Advanced HH to room to discuss [facility] RN.

## 2015-12-11 NOTE — Discharge Summary (Signed)
Physician Discharge Summary  Patient ID: Michele Salas MRN: [id number] DOB/AGE: July 12, 1981 35 y.o.  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 After extensive discussion with the patient and consultation with oncology, she underwent left modified radical mastectomy on 12/09/2015. She tolerated the procedure well. Her postoperative course has been unremarkable. Her diet was advanced without difficulty. Final pathology reveals a T2,N1, M0 invasive ductal carcinoma of the left breast. She is being discharged home with home health in good and improving condition.  Treatments: surgery: Left modified radical mastectomy on 12/09/2015  Discharge Exam: Blood pressure 143/73, pulse 68, temperature 98 F (36.7 C), temperature source Oral, resp. rate 16, height 5' 2"  (1.575 m), weight 65.318 kg (144 lb), last menstrual period 12/04/2015, SpO2 97 %. General appearance: alert, cooperative and no distress Resp: clear to auscultation bilaterally Breasts: Left mastectomy incision healing well. Patient has some limited left arm movement secondary to pain. JP drainage serosanguineous in nature. Cardio: regular rate and rhythm, S1, S2 normal, no murmur, click, rub or gallop  Disposition: Home    Medication List    TAKE these medications        ADRIAMYCIN IV  Inject into the vein. To be every 14 days x 4 treatments.  Start taking on:  12/24/2015     CYTOXAN IJ  Inject as directed. To be every 14 days x 4 treatments.  Start taking on:  12/24/2015     lidocaine-prilocaine cream  Commonly known as:  EMLA   Apply a quarter size amount to port site 1 hour prior to chemo. Do not rub in. Cover with plastic wrap.     NEULASTA ONPRO [place]  Inject into the skin. To be given 27 hours after the completion of chemo.     ondansetron 8 MG tablet  Commonly known as:  ZOFRAN  Take 1 tablet (8 mg total) by mouth every 8 (eight) hours as needed for nausea or vomiting.     oxyCODONE-acetaminophen 5-325 MG tablet  Commonly known as:  PERCOCET/ROXICET  Take 1-2 tablets by mouth every 4 (four) hours as needed for moderate pain.     prochlorperazine 10 MG tablet  Commonly known as:  COMPAZINE  Take 1 tablet (10 mg total) by mouth every 6 (six) hours as needed for nausea or vomiting.     propranolol 20 MG tablet  Commonly known as:  INDERAL  1 po bid. Tome una tableta por boca dos veces diarias           Follow-up Information    Follow up with Jamesetta So, MD. Schedule an appointment as soon as possible for a visit on 12/17/2015.   Specialty:  General Surgery   Contact information:   [address] [address] [phone]       Signed: Aviva Signs A 12/11/2015, 9:07 AM

## 2015-12-18 ENCOUNTER — Encounter (HOSPITAL_COMMUNITY): Payer: Self-pay | Attending: Hematology & Oncology

## 2015-12-18 DIAGNOSIS — D509 Iron deficiency anemia, unspecified: Secondary | ICD-10-CM | POA: Insufficient documentation

## 2015-12-18 DIAGNOSIS — C50112 Malignant neoplasm of central portion of left female breast: Secondary | ICD-10-CM | POA: Insufficient documentation

## 2015-12-18 NOTE — Progress Notes (Signed)
Chemo teaching done for Adriamycin and Cytoxan. I forgot to have patient sign chemo consent and do distress screening. Pt's meds called into [facility]. Pt reminded to pick up medications. Pt got a wig from the [facility]. Translator present for visit.

## 2015-12-20 DIAGNOSIS — Z1379 Encounter for other screening for genetic and chromosomal anomalies: Secondary | ICD-10-CM | POA: Insufficient documentation

## 2015-12-20 NOTE — Progress Notes (Signed)
GENETIC TEST RESULT  HPI: [name] was previously seen in the [facility] due to a personal history of breast cancer at age 35 and concerns regarding a hereditary predisposition to cancer. Please refer to our prior cancer genetics clinic note from October 29, 2015 for more information regarding [name]'s medical, social and family histories, and our assessment and recommendations, at the time. [name] recent genetic test results were disclosed to her, as were recommendations warranted by these results. These results and recommendations are discussed in more detail below.  GENETIC TEST RESULTS: At the time of [name]'s visit on 10/29/15, we recommended she pursue genetic testing of the 28-gene [facility] through [facility] ([facility], [facility]).  The [facility] offered by [facility] includes sequencing and deletion/duplication testing of the following 25 genes: APC, ATM, BARD1, BMPR1A, BRCA1, BRCA2, BRIP1, CHD1, CDK4, CDKN2A, CHEK2, EPCAM (large rearrangement only), GREM1, MLH1, MSH2, MSH6, MUTYH, NBN, PALB2, PMS2, POLD1, POLE, PTEN, [facility], RAD51D, SMAD4, STK11, and TP53.  Those results are now back, the report date for which is November 11, 2015.  Genetic testing was normal, and did not reveal a known deleterious mutation in these genes.  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  The test report will be scanned into [facility] and will be located under the Results Review tab in the Pathology>Molecular Pathology section.   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 At this time, it is unknown if this VUS is associated with an increased risk for cancer or if this is a normal finding. Since this VUS result is uncertain, it cannot help guide screening recommendations, and family members should not  be tested for this VUS to help define their own cancer risks.  Also, we all have variants within our genes that make [place] unique individuals--most of these variants are benign.  Thus, we treat this VUS as a negative result.   With time, we suspect the lab will reclassify this variant and when they do, we will try to re-contact [name] to discuss the reclassification further.  We also encouraged [name] to contact us in a year or two to obtain an update on the status of this VUS.  We discussed with [name] that since the current genetic testing is not perfect, it is possible there may be a gene mutation in one of these genes that current testing cannot detect, but that chance is small. We also discussed, that it is possible that another gene that has not yet been discovered, or that we have not yet tested, is responsible for the cancer diagnoses in the family, and it is, therefore, important to remain in touch with cancer genetics in the future so that we can continue to offer [name] the most up-to-date genetic testing.   CANCER SCREENING RECOMMENDATIONS: While we still do not have an explanation for [name]'s early-onset breast cancer, this result is reassuring and indicates that [name] likely does not have an increased risk for a future cancer due to a mutation in one of these genes. This normal test also suggests that [name]'s cancer was most likely not due to an inherited predisposition associated with one of these genes.  Additionally, most of [name]'s relatives have lived to later ages and have never had cancer.  Most cancers happen by  chance and this negative test suggests that her cancer falls into this category.  We, therefore, recommended she continue to follow the cancer management and screening guidelines provided by her oncology and primary healthcare providers.   RECOMMENDATIONS FOR FAMILY MEMBERS: Women in this family might be at some increased  risk of developing cancer, over the general population risk, simply due to the family history of cancer. We recommended women in this family have a yearly mammogram beginning at age 15, or 74 years younger than the earliest onset of cancer, an an annual clinical breast exam, and perform monthly breast self-exams. Discussed that [name]'s daughters, sisters, and nieces can begin mammogram screening at the age of 4, based on [name]'s age at diagnosis, if they not yet begun mammogram screening. Women in this family should also have a gynecological exam as recommended by their primary provider. All family members should have a colonoscopy by age 75.  FOLLOW-UP: Lastly, we discussed with [name] that cancer genetics is a rapidly advancing field and it is possible that new genetic tests will be appropriate for her and/or her family members in the future. We encouraged her to remain in contact with cancer genetics on an annual basis so we can update her personal and family histories and let her know of advances in cancer genetics that may benefit this family.   Our contact number was provided. [name] questions were answered to her satisfaction, and she knows she is welcome to call us at anytime with additional questions or concerns.   Michele Luz, MS, [facility] Certified Genetic Counselor [email or web address] Phone: [phone]

## 2015-12-24 ENCOUNTER — Encounter (HOSPITAL_COMMUNITY): Payer: Self-pay | Admitting: Hematology & Oncology

## 2015-12-24 ENCOUNTER — Encounter (HOSPITAL_COMMUNITY): Payer: Self-pay | Attending: Hematology & Oncology | Admitting: Hematology & Oncology

## 2015-12-24 ENCOUNTER — Encounter (HOSPITAL_COMMUNITY): Payer: Self-pay | Attending: Hematology & Oncology

## 2015-12-24 ENCOUNTER — Ambulatory Visit (HOSPITAL_COMMUNITY)
Admission: RE | Admit: 2015-12-24 | Discharge: 2015-12-24 | Disposition: A | Discharge status: Home / Self Care | Payer: MEDICAID | Source: Ambulatory Visit | Attending: Oncology | Admitting: Oncology

## 2015-12-24 ENCOUNTER — Inpatient Hospital Stay (HOSPITAL_COMMUNITY): Payer: Self-pay

## 2015-12-24 VITALS — BP 125/84 | HR 73 | Temp 97.8°F | Resp 16 | Wt 142.0 lb

## 2015-12-24 DIAGNOSIS — C50112 Malignant neoplasm of central portion of left female breast: Secondary | ICD-10-CM | POA: Insufficient documentation

## 2015-12-24 DIAGNOSIS — C50919 Malignant neoplasm of unspecified site of unspecified female breast: Secondary | ICD-10-CM

## 2015-12-24 DIAGNOSIS — D509 Iron deficiency anemia, unspecified: Secondary | ICD-10-CM

## 2015-12-24 DIAGNOSIS — C773 Secondary and unspecified malignant neoplasm of axilla and upper limb lymph nodes: Secondary | ICD-10-CM

## 2015-12-24 DIAGNOSIS — Z452 Encounter for adjustment and management of vascular access device: Secondary | ICD-10-CM | POA: Insufficient documentation

## 2015-12-24 LAB — COMPREHENSIVE METABOLIC PANEL
ALT: 38 U/L (ref 14–54)
AST: 26 U/L (ref 15–41)
Albumin: 4.1 g/dL (ref 3.5–5.0)
Alkaline Phosphatase: 78 U/L (ref 38–126)
Anion gap: 6 (ref 5–15)
BUN: 8 mg/dL (ref 6–20)
CO2: 26 mmol/L (ref 22–32)
Calcium: 9.2 mg/dL (ref 8.9–10.3)
Chloride: 105 mmol/L (ref 101–111)
Creatinine, Ser: 0.41 mg/dL — ABNORMAL LOW (ref 0.44–1.00)
GFR calc Af Amer: >60 mL/min (ref >60)
GFR calc non Af Amer: >60 mL/min (ref >60)
Glucose, Bld: 105 mg/dL — ABNORMAL HIGH (ref 65–99)
Potassium: 3.7 mmol/L (ref 3.5–5.1)
Sodium: 137 mmol/L (ref 135–145)
Total Bilirubin: 0.6 mg/dL (ref 0.3–1.2)
Total Protein: 7.2 g/dL (ref 6.5–8.1)

## 2015-12-24 LAB — CBC WITH DIFFERENTIAL/PLATELET
Basophils Absolute: 0 10*3/uL (ref 0.0–0.1)
Basophils Relative: 0 %
Eosinophils Absolute: 0.3 10*3/uL (ref 0.0–0.7)
Eosinophils Relative: 3 %
HCT: 38 % (ref 36.0–46.0)
Hemoglobin: 12.7 g/dL (ref 12.0–15.0)
Lymphocytes Relative: 29 %
Lymphs Abs: 2.3 10*3/uL (ref 0.7–4.0)
MCH: 27.3 pg (ref 26.0–34.0)
MCHC: 33.4 g/dL (ref 30.0–36.0)
MCV: 81.5 fL (ref 78.0–100.0)
Monocytes Absolute: 0.7 10*3/uL (ref 0.1–1.0)
Monocytes Relative: 9 %
Neutro Abs: 4.7 10*3/uL (ref 1.7–7.7)
Neutrophils Relative %: 59 %
Platelets: 375 10*3/uL (ref 150–400)
RBC: 4.66 MIL/uL (ref 3.87–5.11)
RDW: 19.5 % — ABNORMAL HIGH (ref 11.5–15.5)
WBC: 8 10*3/uL (ref 4.0–10.5)

## 2015-12-24 MED ORDER — SODIUM CHLORIDE 0.9 % IV SOLN
Freq: Once | INTRAVENOUS | Status: DC
  Filled 2015-12-24: qty 5

## 2015-12-24 MED ORDER — HEPARIN SOD (PORK) LOCK FLUSH 100 UNIT/ML IV SOLN: 500.0000 [IU] | Freq: Once | INTRAVENOUS | Status: DC | PRN

## 2015-12-24 MED ORDER — PEGFILGRASTIM 6 MG/0.6ML ~~LOC~~ PSKT: 6.0000 mg | PREFILLED_SYRINGE | Freq: Once | SUBCUTANEOUS | Status: DC

## 2015-12-24 MED ORDER — DOXORUBICIN HCL CHEMO IV INJECTION 2 MG/ML
60.0000 mg/m2 | Freq: Once | INTRAVENOUS | Status: DC
  Filled 2015-12-24: qty 50

## 2015-12-24 MED ORDER — HEPARIN SOD (PORK) LOCK FLUSH 100 UNIT/ML IV SOLN
INTRAVENOUS | Status: AC | PRN
  Administered 2015-12-24: 500 [IU]

## 2015-12-24 MED ORDER — SODIUM CHLORIDE 0.9 % IV SOLN: Freq: Once | INTRAVENOUS | Status: DC

## 2015-12-24 MED ORDER — HEPARIN SOD (PORK) LOCK FLUSH 100 UNIT/ML IV SOLN
INTRAVENOUS | Status: AC
  Filled 2015-12-24: qty 5

## 2015-12-24 MED ORDER — IOPAMIDOL (ISOVUE-300) INJECTION 61%
20.0000 mL | Freq: Once | INTRAVENOUS | Status: AC | PRN
  Administered 2015-12-24: 20 mL via INTRAVENOUS

## 2015-12-24 MED ORDER — CYCLOPHOSPHAMIDE CHEMO INJECTION 1 GM
600.0000 mg/m2 | Freq: Once | INTRAMUSCULAR | Status: DC
  Filled 2015-12-24: qty 50

## 2015-12-24 MED ORDER — SODIUM CHLORIDE 0.9% FLUSH: 10.0000 mL | INTRAVENOUS | Status: DC | PRN

## 2015-12-24 MED ORDER — PALONOSETRON HCL INJECTION 0.25 MG/5ML: 0.2500 mg | Freq: Once | INTRAVENOUS | Status: DC

## 2015-12-24 NOTE — Procedures (Signed)
Appropriately positioned and functioning right subclavian vein approach port-a-catheter without evidence of fibrin sheath, catheter kink or fracture.  Ronny Bacon, MD Pager #: [phone]

## 2015-12-24 NOTE — Progress Notes (Signed)
[facility] Hematology/Oncology Consultation   Name: Michele Salas      MRN: [id number]    Date: 12/24/2015 Time:8:31 AM   REFERRING PHYSICIAN:  Aviva Signs, MD (Gen Surg)  REASON FOR CONSULT:  Left sided breast cancer   DIAGNOSIS:  Invasive ductal carcinoma, high grade, of left breast.  HISTORY OF PRESENT ILLNESS:   [name] is a 35 year old Hispanic woman with a past medical history significant for headaches who is referred to the [facility] for a new diagnosis of left breast cancer.    Cancer of central portion of left female breast ([place])   09/16/2015 Mammogram 1.6 cm retroareolar left breast mass measuring 1.6 cm in largest diameter in addition to 3 abnormal appearing left inferior axillary lymph nodes.   09/23/2015 Initial Biopsy US guided core biopsy of left breast mass and suspicious left axillary lymph nodes.   09/23/2015 Pathology Results Invasive ductal carcinoma, high grade.  Left axilla lymph node is positive for metastatic carcinoma.  ER/PR NEGATIVE, HER2 NEGATIVE, KI-67 marker of 85%.   10/26/2015 Imaging Bone scan- No evidence of metastatic disease to the skeleton.   10/27/2015 Imaging CT CAP-  Retroareolar mass with 2 asymmetrically prominent left axillary lymph nodes and an upper normal size left subpectoral lymph node. No other findings of metastatic disease in the chest, abdomen, or pelvis.   11/05/2015 Echocardiogram The estimated ejection fraction was in the range of 60% to 65%.   11/23/2015 Miscellaneous Genetic Counseling- genetic test result was negative for any known pathogenic mutations within any of 28 genes that would cause her to be at an increased genetic risk for breast, ovarian, or other related cancers.    [name] is accompanied by her husband and a Optometrist. Our visit was facilitated by a translator today. I personally reviewed and went over her cancer diagnosis at length, including staging and  treatment plan. She is here for Cycle #1 adjuvant breast chemotherapy.  She is nervous and had a lot of questions regarding her cancer diagnosis. Her husband had questions regarding chemotherapy, explaining he did not attend the chemotherapy teaching session while the patient did. She believes the genetic results meant she only has a 1% chance of recurrence - I went over the actual risks of recurrence with our planned therapy.  When the patient went to the store for bra prostheses she did not qualify for a discount and would like information on how to receive financial help with this.  She has nausea medication. She has pain medication that she takes only when in pain, and this is not very often. Noting the pain medication makes her very sleepy.   She has been using her arm to clean the table and do chores at home.     Review of Systems  Constitutional: Negative for fever, chills, weight loss, malaise/fatigue and diaphoresis.  HENT: Negative for congestion and sore throat.   Eyes: Negative for blurred vision, double vision and redness.  Respiratory: Negative for cough, hemoptysis, sputum production, shortness of breath and wheezing.   Cardiovascular: Negative for chest pain, palpitations and leg swelling.  Gastrointestinal: Positive for abdominal pain. Negative for heartburn, nausea, vomiting, diarrhea, constipation, blood in stool and melena.       Epigastric pain with eating greasy/fatty foods  Genitourinary: Negative for dysuria, frequency and hematuria.  Musculoskeletal: Negative for myalgias, back pain, joint pain, falls and neck pain.  Skin: Negative for itching and rash.  Neurological: Negative for dizziness, tingling, tremors, sensory change, speech change, focal weakness and weakness.  Endo/Heme/Allergies: Negative.   Psychiatric/Behavioral: The patient is nervous/anxious.        Nervous about recent cancer diagnosis.   HORMONAL RISK FACTORS:  Menarche was at age 19.  First  live birth at age 73.  OCP use for approximately not sure how long she took birth control for, but reports that she has not taken in 62 years.  Ovaries intact: yes.  Hysterectomy: no.  Menopausal status: premenopausal, but periods are irregular. HRT use: 0 years. Colonoscopy: n/a; not examined. Mammogram within the last year: this was her first mammogram. Number of breast biopsies: 1. Up to date with pelvic exams: yes. Any excessive radiation exposure in the past: no   PAST MEDICAL HISTORY:   Past Medical History  Diagnosis Date  . GERD (gastroesophageal reflux disease)   . Breast cancer, left ([place]) 10/22/2015  . Iron deficiency anemia 10/27/2015    ALLERGIES: No Known Allergies    MEDICATIONS: I have reviewed the patient's current medications.    Current Outpatient Prescriptions on File Prior to Visit  Medication Sig Dispense Refill  . Cyclophosphamide (CYTOXAN IJ) Inject as directed. To be every 14 days x 4 treatments.    Marland Kitchen DOXOrubicin HCl (ADRIAMYCIN IV) Inject into the vein. To be every 14 days x 4 treatments.    . lidocaine-prilocaine (EMLA) cream Apply a quarter size amount to port site 1 hour prior to chemo. Do not rub in. Cover with plastic wrap. 30 g 3  . ondansetron (ZOFRAN) 8 MG tablet Take 1 tablet (8 mg total) by mouth every 8 (eight) hours as needed for nausea or vomiting. 30 tablet 2  . oxyCODONE-acetaminophen (PERCOCET/ROXICET) 5-325 MG tablet Take 1-2 tablets by mouth every 4 (four) hours as needed for moderate pain. 40 tablet 0  . Pegfilgrastim (NEULASTA ONPRO [place]) Inject into the skin. To be given 27 hours after the completion of chemo.    . prochlorperazine (COMPAZINE) 10 MG tablet Take 1 tablet (10 mg total) by mouth every 6 (six) hours as needed for nausea or vomiting. 30 tablet 2  . propranolol (INDERAL) 20 MG tablet 1 po bid. Tome una tableta por [employer] veces diarias (Patient taking differently: 1 po bid. Tome una tableta por boca dos veces  diarias   Pt is taking as needed) 60 tablet 3   No current facility-administered medications on file prior to visit.     PAST SURGICAL HISTORY Past Surgical History  Procedure Laterality Date  . Mastectomy modified radical Left 12/09/2015    Procedure: LEFT MODIFIED RADICAL MASTECTOMY;  Surgeon: Aviva Signs, MD;  Location: [facility];  Service: General;  Laterality: Left;  . Portacath placement Right 12/09/2015    Procedure: INSERTION PORT-A-CATH RIGHT SUBCLAVIAN;  Surgeon: Aviva Signs, MD;  Location: [facility];  Service: General;  Laterality: Right;    FAMILY HISTORY: Family History  Problem Relation Age of Onset  . Diabetes Mother   . Uterine cancer Paternal Aunt 68  . Heart attack Paternal Uncle     unspecified age  . Heart attack Paternal Grandfather 79  . Other Paternal Uncle     stomach swollen with fluid that had to be removed; lim info  Mother is alive at the age of 81. She has hypertension and diabetes. She lives in [place]. Father is deceased at unknown age and for unknown reason. She reports "he was old." Brothers 18 years old and healthy. She has  many half siblings, many of whom live in [place]. She has 3 children. She is a 84 year old daughter who lives in [place], 48 year old son who lives here in [place], and 32 year old daughter who lives in [place]. All children are healthy.  SOCIAL HISTORY: Patient denies any tobacco abuse. Rarely does she have alcoholic beverage. She denies any illicit drug abuse. 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 She is religious and Catholic. She is not a documented citizen of the [place]. She is married.  PERFORMANCE STATUS: The patient's performance status is 0 - Asymptomatic  PHYSICAL EXAM: Most Recent Vital Signs: Blood pressure 125/84, pulse 73, temperature 97.8 F (36.6 C), temperature source Oral, resp. rate 16, weight 142 lb (64.411 kg), last menstrual period 11/30/2015, SpO2 100 %. General  appearance: alert, cooperative, appears stated age, no distress and Hispanic, non-english speaking, accompanied by a translator and her husband Head: Normocephalic, without obvious abnormality, atraumatic Eyes: negative findings: lids and lashes normal, conjunctivae and sclerae normal and corneas clear Throat: lips, mucosa, and tongue normal; teeth and gums normal Neck: no adenopathy, supple, symmetrical, trachea midline and thyroid not enlarged, symmetric, no tenderness/mass/nodules Lungs: clear to auscultation bilaterally and normal percussion bilaterally Breasts: Post mastectomy site is well healing with steri strips in place and minimal bruising. Heart: regular rate and rhythm, S1, S2 normal, no murmur, click, rub or gallop Abdomen: soft, non-tender; bowel sounds normal; no masses,  no organomegaly Extremities: extremities normal, atraumatic, no cyanosis or edema Skin: Skin color, texture, turgor normal. No rashes or lesions Lymph nodes: Axillary adenopathy: left anterior line adenopathy Neurologic: Alert and oriented X 3, normal strength and tone. Normal symmetric reflexes. Normal coordination and gait  LABORATORY DATA:  I have reviewed the data as listed.  Results for Michele, Salas (MRN [id number]) as of 12/24/2015 16:15  Ref. Range 12/24/2015 09:22  Sodium Latest Ref Range: 135-145 mmol/L 137  Potassium Latest Ref Range: 3.5-5.1 mmol/L 3.7  Chloride Latest Ref Range: 101-111 mmol/L 105  CO2 Latest Ref Range: 22-32 mmol/L 26  BUN Latest Ref Range: 6-20 mg/dL 8  Creatinine Latest Ref Range: 0.44-1.00 mg/dL 0.41 (L)  Calcium Latest Ref Range: 8.9-10.3 mg/dL 9.2  EGFR (Non-African Amer.) Latest Ref Range: >60 mL/min >60  EGFR (African American) Latest Ref Range: >60 mL/min >60  Glucose Latest Ref Range: 65-99 mg/dL 105 (H)  Anion gap Latest Ref Range: 5-15  6  Alkaline Phosphatase Latest Ref Range: 38-126 U/L 78  Albumin Latest Ref Range: 3.5-5.0 g/dL 4.1  AST Latest Ref Range:  15-41 U/L 26  ALT Latest Ref Range: 14-54 U/L 38  Total Protein Latest Ref Range: 6.5-8.1 g/dL 7.2  Total Bilirubin Latest Ref Range: 0.3-1.2 mg/dL 0.6  WBC Latest Ref Range: 4.0-10.5 K/uL 8.0  RBC Latest Ref Range: 3.87-5.11 MIL/uL 4.66  Hemoglobin Latest Ref Range: 12.0-15.0 g/dL 12.7  HCT Latest Ref Range: 36.0-46.0 % 38.0  MCV Latest Ref Range: 78.0-100.0 fL 81.5  MCH Latest Ref Range: 26.0-34.0 pg 27.3  MCHC Latest Ref Range: 30.0-36.0 g/dL 33.4  RDW Latest Ref Range: 11.5-15.5 % 19.5 (H)  Platelets Latest Ref Range: 150-400 K/uL 375  Neutrophils Latest Units: % 59  Lymphocytes Latest Units: % 29  Monocytes Relative Latest Units: % 9  Eosinophil Latest Units: % 3  Basophil Latest Units: % 0  NEUT# Latest Ref Range: 1.7-7.7 K/uL 4.7  Lymphocyte # Latest Ref Range: 0.7-4.0 K/uL 2.3  Monocyte # Latest Ref Range: 0.1-1.0 K/uL 0.7  Eosinophils  Absolute Latest Ref Range: 0.0-0.7 K/uL 0.3  Basophils Absolute Latest Ref Range: 0.0-0.1 K/uL 0.0   RADIOGRAPHY: I have personally reviewed the radiological images as listed and agreed with the findings in the report.  Study Result     CLINICAL DATA: Left breast cancer staging workup.  EXAM: CT CHEST, ABDOMEN, AND PELVIS WITH CONTRAST  TECHNIQUE: Multidetector CT imaging of the chest, abdomen and pelvis was performed following the standard protocol during bolus administration of intravenous contrast.  CONTRAST: 154m OMNIPAQUE IOHEXOL 300 MG/ML SOLN  COMPARISON: 10/26/2015  FINDINGS: CT CHEST FINDINGS  Mediastinum/Nodes: No mediastinal or hilar adenopathy. No internal mammary adenopathy. A left axillary lymph node measures 1.3 cm in short axis on image 16/2. A second left axillary lymph node measures 1.1 cm in short axis on image 21/2. A left subpectoral lymph node measures 0.8 cm in short axis, image 12/2.  Lungs/Pleura: 5 mm densely calcified nodule in the right upper lobe has a faint margin of soft tissue  density vague given the dense calcification and the presence of a small calcified prevascular node on the right, I favor this is being a benign calcified granuloma. The lungs appear otherwise clear.  Musculoskeletal: No bony abnormality. Left breast retroareolar mass approximately 2 cm diameter, image 28 series 2.  CT ABDOMEN PELVIS FINDINGS  Hepatobiliary: Mildly contracted gallbladder. Otherwise unremarkable.  Pancreas: Unremarkable  Spleen: Unremarkable  Adrenals/Urinary Tract: Unremarkable  Stomach/Bowel: Unremarkable  Vascular/Lymphatic: Unremarkable  Reproductive: Indistinct endometrium up to 1.6 cm in thickness. Adnexa unremarkable.  Other: No supplemental non-categorized findings.  Musculoskeletal: Small bilateral direct inguinal hernias containing adipose tissue. No findings of bony metastatic disease.  IMPRESSION: 1. Retroareolar mass with 2 asymmetrically prominent left axillary lymph nodes and an upper normal size left subpectoral lymph node. No other findings of metastatic disease in the chest, abdomen, or pelvis. 2. 5 mm fairly densely calcified nodule in the right upper lobe, favoring benign calcified granuloma. 3. Borderline endometrial thickening, although this may be normal in the secretory phase. 4. Incidental small bilateral direct inguinal hernias containing adipose tissue.   Electronically Signed  By: WVan ClinesM.D.  On: 10/27/2015 18:14   Study Result     CLINICAL DATA: New diagnosis of left-sided breast malignancy, evaluate for bony metastases.  EXAM: NUCLEAR MEDICINE WHOLE BODY BONE SCAN  TECHNIQUE: Whole body anterior and posterior images were obtained approximately 3 hours after intravenous injection of radiopharmaceutical.  RADIOPHARMACEUTICALS: 26.8 mCi Technetium-912mDP IV  COMPARISON: None in PACs  FINDINGS: There is adequate uptake of the radiopharmaceutical by the skeleton. There is  adequate soft tissue clearance and renal activity. Activity within the calvarium, spine, ribs, and pelvis is normal. Activity within the upper and lower extremities is within the limits of normal as well.  IMPRESSION: No evidence of metastatic disease to the skeleton.   Electronically Signed  By: David JoMartinique.D.  On: 10/26/2015 12:53    PATHOLOGY:      [facility] from [facility] Biopsy of left breast mass at the 12:00 position demonstrates invasive ductal carcinoma, high-grade.  Left axillary lymph node biopsy demonstrates invasive ductal carcinoma, high-grade, consistent with metastasis to lymph node. Breast prognostic panel is performed demonstrating ER/PR negativity, HER-2/neu negativity, and a KI-67 Marker of 85%.  ASSESSMENT/PLAN:  Iron deficiency anemia She did well with IV iron replacement. She was severely iron deficiency and iron stores were replaced in preparation for chemotherapy. 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   The patient is here for Cycle #1 AC today. She has completed chemotherapy teaching.   I personally reviewed and went over her cancer diagnosis at length, including staging and treatment plan.   Reviewed with the patient that if she experiences any issue with treatment today, she needs to let us know.   I will see her again next week for follow up.   All questions were answered. The patient knows to call the clinic with any problems, questions or concerns. We can certainly see the patient much sooner if necessary.  This document serves as a record of services personally performed by Ancil Linsey, MD. It was created on her behalf by Arlyce Harman, a trained medical scribe. The creation of this record is based on the scribe's personal  observations and the provider's statements to them. This document has been checked and approved by the attending provider.  I have reviewed the above documentation for accuracy and completeness, and I agree with the above.   This note is electronically signed by: Molli Hazard, MD   12/24/2015 8:31 AM

## 2015-12-24 NOTE — Progress Notes (Signed)
Pt will be going to [facility] for a dye study for her port a cath.  She will return tomorrow at 8:30 for chemotherapy

## 2015-12-24 NOTE — Patient Instructions (Addendum)
[facility] Discharge Instructions for Patients Receiving Chemotherapy   Beginning January 23rd 2017 lab work for the [facility] will be done in the  Main lab at [facility] on 1st floor. If you have a lab appointment with the [facility] please come in thru the  Main Entrance and check in at the main information desk   Today you received the following chemotherapy agents adriamycin and cytoxan Return tomorrow at 8:30 for chemotherapy  We will call you tomorrow to check on you to see how you are doing     What to Know After Chemo What am I supposed to do? . Live your Life!!!!! Continue to do your normal routine or activities of daily living.  . You may feel more tired than usual as your treatments progress, expect that and plan for it. What should I eat? . Fresh fruits and vegetables that have been thoroughly cleaned but NO grapefruit. . Thoroughly cook your food - no hot pink center or pink/red juices. . No raw or undercooked eggs/meat. . Expect that your taste buds will change. Foods/drinks are going to taste different.  Michele Salas has Dieticians available to help with meal ideas and planning. What do I do if I feel sick to my stomach or throw up?  Marland Kitchen Feeling sick to your stomach is called feeling "nauseated."  You will see "nausea" written on your prescriptions and discharge instructions. . Take your nausea medications as directed on bottle at the 1st sign of feeling sick.  DO NOT WAIT. . If you throw up more than once, contact the [facility].   . If it is the weekend, please go to the nearest Emergency Room or Urgent [place]. What do I do if I cannot have a bowel movement (constipated) or have diarrhea?  . If you can't have a bowel movement (constipated), these medications can be bought over-the-counter at drug stores or [employer].  o Colace (docusate sodium) 128m capsule. May take 1-6 capsules everyday as needed to soften stool. o Senna-S or  Senokot (laxative). May take 1-4 tablets daily as needed for mild constipation.  o Milk of Magnesia.  (laxative for moderate - severe constipation) You may take up to 8 tablespoons in a 24 hour period. Start with 1 tablespoon (159m of Milk of Magnesia. If no BM within 6-8 hours, take 2 tablespoons (3063m If no BM, increase to 3 tablespoons (45m73mIf no BM, contact the [facility] [facility] If your bowels get loose (diarrhea) take Imodium. Take Imodium 2 tablets after the 1st loose stool, and then 1 tablet every 2 hours until 12 hours have passed without a bowel movement. May take 2 tablets @ bedtime and every 4 hours until morning. If diarrhea recurs repeat. o If the Imodium does not slow down or stop your diarrhea - call the [place] not let this continue for more than 24 hours. Diarrhea/loose stools can lead to dehydration.    What do I do if I get fever and/or chills? . Check your temperature daily to monitor for fever or infection. . If temperature is greater than or equal to 100.5 or you are experiencing chills call the [facility] [facility], report to the nearest emergency room or urgent care. NEVER IGNORE A FEVER! THIS IS FOR YOUR SAFETY! What do I do if I get mouth sores or have pain in my mouth? . Call the [facility] speak to  a nurse. . If it is the weekend or after hours, rinse your mouth with salt water swishes, gargles, and spits at least 3 times a day - then call us Monday morning.   . DO NOT eat citrus or spicy foods, DO NOT rinse your mouth with alcohol based mouthwashes, & DO NOT ignore mouth pain.   What do I do if I get green or yellow mucus production or a congested cough? . Call the [facility] and speak to a nurse.  . If it is after hours or the weekend and these symptoms are making it hard for you to breathe, and/or you also have a fever --- go to the emergency room or an urgent care center. What do I do if I begin burning when I  urinate? . Call the [facility] and speak to a nurse. If it is after hours or the weekend and along with these symptoms you also have a fever, go to the emergency room or urgent care center. (Caregivers of patients receiving chemo --- In older patients, a sign of a urinary tract infection can be confusion and not necessarily a fever.) What do I do if I have shortness of breath? . You may feel tired or feel like you get "winded" more easily than usual after your chemotherapy treatments.  You should never feel like you cannot breathe. . If you are panting, lips turning blue or purple, feeling dizzy or light headed, this is NOT normal. You need to get medical help immediately.  Dial 911.  This information is intended to be a supplement to the in depth review that you had during chemotherapy teaching with our Nurse Navigator.  As always, if you have questions call the [facility] [place] and speak with a nurse.  [facility] Phone Numbers: Hours of Operation: Monday - Friday 8:30-4:30, Closed for [place] Number:  [phone]   Nurse Navigator:  [phone]   Scheduler: [phone]         To help prevent nausea and vomiting after your treatment, we encourage you to take your nausea medication    If you develop nausea and vomiting, or diarrhea that is not controlled by your medication, call the clinic.  The clinic phone number is [phone]. Office hours are Monday-Friday 8:30am-5:00pm.  BELOW ARE SYMPTOMS THAT SHOULD BE REPORTED IMMEDIATELY:  *FEVER GREATER THAN 101.0 F  *CHILLS WITH OR WITHOUT FEVER  NAUSEA AND VOMITING THAT IS NOT CONTROLLED WITH YOUR NAUSEA MEDICATION  *UNUSUAL SHORTNESS OF BREATH  *UNUSUAL BRUISING OR BLEEDING  TENDERNESS IN MOUTH AND THROAT WITH OR WITHOUT PRESENCE OF ULCERS  *URINARY PROBLEMS  *BOWEL PROBLEMS  UNUSUAL RASH Items with * indicate a potential emergency and should be followed up as soon as possible. If you have an emergency after  office hours please contact your primary care physician or go to the nearest emergency department.  Please call the clinic during office hours if you have any questions or concerns.   You may also contact the Patient Navigator at [phone] should you have any questions or need assistance in obtaining follow up care.      Resources For Cancer Patients and their Caregivers ? American Cancer Society: Can assist with transportation, wigs, general needs, runs Look Good Feel Better.        ([phone] 740-3164 ? Cancer Care: Provides financial assistance, online support groups, medication/co-pay assistance.  1-800-813-HOPE 954-699-2105) ? [facility] Assists [place] cancer patients and their families through  emotional , educational and financial support.  (416)551-3844 ? [place] DSS Where to apply for food stamps, Medicaid and utility assistance. 548-866-7237 ? [facility]: Transportation to medical appointments. 757 678 9341 ? Social Security Administration: May apply for disability if have a Stage IV cancer. [phone] ? [employer], Disability and Transit Services: Assists with nutrition, care and transit needs. 402-321-7743

## 2015-12-25 ENCOUNTER — Encounter (HOSPITAL_COMMUNITY): Payer: Self-pay | Attending: Hematology & Oncology

## 2015-12-25 VITALS — BP 126/77 | HR 61 | Temp 97.7°F | Resp 16

## 2015-12-25 DIAGNOSIS — C50112 Malignant neoplasm of central portion of left female breast: Secondary | ICD-10-CM

## 2015-12-25 DIAGNOSIS — C773 Secondary and unspecified malignant neoplasm of axilla and upper limb lymph nodes: Secondary | ICD-10-CM

## 2015-12-25 DIAGNOSIS — Z5111 Encounter for antineoplastic chemotherapy: Secondary | ICD-10-CM

## 2015-12-25 MED ORDER — PEGFILGRASTIM 6 MG/0.6ML ~~LOC~~ PSKT
6.0000 mg | PREFILLED_SYRINGE | Freq: Once | SUBCUTANEOUS | Status: AC
  Administered 2015-12-25: 6 mg via SUBCUTANEOUS
  Filled 2015-12-25: qty 0.6

## 2015-12-25 MED ORDER — DOXORUBICIN HCL CHEMO IV INJECTION 2 MG/ML
60.0000 mg/m2 | Freq: Once | INTRAVENOUS | Status: AC
  Administered 2015-12-25: 100 mg via INTRAVENOUS
  Filled 2015-12-25: qty 50

## 2015-12-25 MED ORDER — HEPARIN SOD (PORK) LOCK FLUSH 100 UNIT/ML IV SOLN
500.0000 [IU] | Freq: Once | INTRAVENOUS | Status: AC | PRN
  Administered 2015-12-25: 500 [IU]
  Filled 2015-12-25: qty 5

## 2015-12-25 MED ORDER — SODIUM CHLORIDE 0.9% FLUSH
10.0000 mL | INTRAVENOUS | Status: DC | PRN
  Administered 2015-12-25: 10 mL
  Filled 2015-12-25: qty 10

## 2015-12-25 MED ORDER — PALONOSETRON HCL INJECTION 0.25 MG/5ML
0.2500 mg | Freq: Once | INTRAVENOUS | Status: AC
  Administered 2015-12-25: 0.25 mg via INTRAVENOUS
  Filled 2015-12-25: qty 5

## 2015-12-25 MED ORDER — SODIUM CHLORIDE 0.9 % IV SOLN
Freq: Once | INTRAVENOUS | Status: AC
  Administered 2015-12-25: 09:00:00 via INTRAVENOUS
  Filled 2015-12-25: qty 5

## 2015-12-25 MED ORDER — SODIUM CHLORIDE 0.9 % IV SOLN
Freq: Once | INTRAVENOUS | Status: AC
  Administered 2015-12-25: 09:00:00 via INTRAVENOUS

## 2015-12-25 MED ORDER — CYCLOPHOSPHAMIDE CHEMO INJECTION 1 GM
600.0000 mg/m2 | Freq: Once | INTRAMUSCULAR | Status: AC
  Administered 2015-12-25: 1000 mg via INTRAVENOUS
  Filled 2015-12-25: qty 50

## 2015-12-25 NOTE — Progress Notes (Signed)
1115:  ONPRO neulasta on body injector placed. See MAR for administration details. Injector in place and engaged with green light indicator on flashing. Tolerated application with out problems. Tolerated tx w/o adverse reaction.  A&Ox4, in no distress.  VSS.  Discharged ambulatory.

## 2015-12-25 NOTE — Patient Instructions (Signed)
[facility] Discharge Instructions for Patients Receiving Chemotherapy   Beginning January 23rd 2017 lab work for the [facility] will be done in the  Main lab at [facility] on 1st floor. If you have a lab appointment with the [facility] please come in thru the  Main Entrance and check in at the main information desk   Today you received the following chemotherapy agents:  Adriamycin and Cytoxan.  To help prevent nausea and vomiting after your treatment, we encourage you to take your nausea medication as prescribed.    If you develop nausea and vomiting, or diarrhea that is not controlled by your medication, call the clinic.  The clinic phone number is [phone]. Office hours are Monday-Friday 8:30am-5:00pm.  BELOW ARE SYMPTOMS THAT SHOULD BE REPORTED IMMEDIATELY:  *FEVER GREATER THAN 101.0 F  *CHILLS WITH OR WITHOUT FEVER  NAUSEA AND VOMITING THAT IS NOT CONTROLLED WITH YOUR NAUSEA MEDICATION  *UNUSUAL SHORTNESS OF BREATH  *UNUSUAL BRUISING OR BLEEDING  TENDERNESS IN MOUTH AND THROAT WITH OR WITHOUT PRESENCE OF ULCERS  *URINARY PROBLEMS  *BOWEL PROBLEMS  UNUSUAL RASH Items with * indicate a potential emergency and should be followed up as soon as possible. If you have an emergency after office hours please contact your primary care physician or go to the nearest emergency department.  Please call the clinic during office hours if you have any questions or concerns.   You may also contact the Patient Navigator at [phone] should you have any questions or need assistance in obtaining follow up care.      Resources For Cancer Patients and their Caregivers ? American Cancer Society: Can assist with transportation, wigs, general needs, runs Look Good Feel Better.        (641)069-5998 ? Cancer Care: Provides financial assistance, online support groups, medication/co-pay assistance.  1-800-813-HOPE (872)325-0449) ? [facility] Assists [place] cancer patients and their families through emotional , educational and financial support.  346-654-3819 ? [place] DSS Where to apply for food stamps, Medicaid and utility assistance. 3130737307 ? [facility]: Transportation to medical appointments. (715)045-5499 ? Social Security Administration: May apply for disability if have a Stage IV cancer. ([phone] 8062392179 ? [employer], Disability and Transit Services: Assists with nutrition, care and transit needs. [phone]

## 2015-12-28 ENCOUNTER — Telehealth (HOSPITAL_COMMUNITY): Payer: Self-pay

## 2015-12-28 NOTE — Telephone Encounter (Signed)
24 hour follow up call.  Patient tolerated chemotherapy well.  Mild fatigue, but no other side effects.

## 2015-12-31 ENCOUNTER — Encounter (HOSPITAL_COMMUNITY): Payer: Self-pay | Attending: Oncology | Admitting: Oncology

## 2015-12-31 ENCOUNTER — Encounter: Payer: Self-pay | Admitting: Dietician

## 2015-12-31 ENCOUNTER — Encounter (HOSPITAL_BASED_OUTPATIENT_CLINIC_OR_DEPARTMENT_OTHER): Payer: Self-pay

## 2015-12-31 ENCOUNTER — Encounter (HOSPITAL_COMMUNITY): Payer: Self-pay | Admitting: Oncology

## 2015-12-31 VITALS — BP 115/78 | HR 83

## 2015-12-31 VITALS — BP 117/57 | HR 78 | Temp 98.0°F | Resp 17 | Wt 134.7 lb

## 2015-12-31 DIAGNOSIS — C773 Secondary and unspecified malignant neoplasm of axilla and upper limb lymph nodes: Secondary | ICD-10-CM

## 2015-12-31 DIAGNOSIS — D509 Iron deficiency anemia, unspecified: Secondary | ICD-10-CM

## 2015-12-31 DIAGNOSIS — R112 Nausea with vomiting, unspecified: Secondary | ICD-10-CM

## 2015-12-31 DIAGNOSIS — R634 Abnormal weight loss: Secondary | ICD-10-CM

## 2015-12-31 DIAGNOSIS — C50112 Malignant neoplasm of central portion of left female breast: Secondary | ICD-10-CM

## 2015-12-31 DIAGNOSIS — R5383 Other fatigue: Secondary | ICD-10-CM

## 2015-12-31 DIAGNOSIS — R42 Dizziness and giddiness: Secondary | ICD-10-CM

## 2015-12-31 DIAGNOSIS — R1013 Epigastric pain: Secondary | ICD-10-CM

## 2015-12-31 DIAGNOSIS — I959 Hypotension, unspecified: Secondary | ICD-10-CM

## 2015-12-31 DIAGNOSIS — R638 Other symptoms and signs concerning food and fluid intake: Secondary | ICD-10-CM

## 2015-12-31 LAB — CBC WITH DIFFERENTIAL/PLATELET
Basophils Absolute: 0 10*3/uL (ref 0.0–0.1)
Basophils Relative: 2 %
Eosinophils Absolute: 0.1 10*3/uL (ref 0.0–0.7)
Eosinophils Relative: 9 %
HCT: 35.4 % — ABNORMAL LOW (ref 36.0–46.0)
Hemoglobin: 11.8 g/dL — ABNORMAL LOW (ref 12.0–15.0)
Lymphocytes Relative: 67 %
Lymphs Abs: 0.8 10*3/uL (ref 0.7–4.0)
MCH: 27.2 pg (ref 26.0–34.0)
MCHC: 33.3 g/dL (ref 30.0–36.0)
MCV: 81.6 fL (ref 78.0–100.0)
Monocytes Absolute: 0.1 10*3/uL (ref 0.1–1.0)
Monocytes Relative: 7 %
Neutro Abs: 0.2 10*3/uL — ABNORMAL LOW (ref 1.7–7.7)
Neutrophils Relative %: 16 %
Platelets: 185 10*3/uL (ref 150–400)
RBC: 4.34 MIL/uL (ref 3.87–5.11)
RDW: 18 % — ABNORMAL HIGH (ref 11.5–15.5)
WBC: 1.2 10*3/uL — CL (ref 4.0–10.5)

## 2015-12-31 LAB — COMPREHENSIVE METABOLIC PANEL
ALT: 67 U/L — ABNORMAL HIGH (ref 14–54)
AST: 21 U/L (ref 15–41)
Albumin: 4.4 g/dL (ref 3.5–5.0)
Alkaline Phosphatase: 112 U/L (ref 38–126)
Anion gap: 8 (ref 5–15)
BUN: 10 mg/dL (ref 6–20)
CO2: 26 mmol/L (ref 22–32)
Calcium: 9 mg/dL (ref 8.9–10.3)
Chloride: 103 mmol/L (ref 101–111)
Creatinine, Ser: 0.4 mg/dL — ABNORMAL LOW (ref 0.44–1.00)
GFR calc Af Amer: >60 mL/min (ref >60)
GFR calc non Af Amer: >60 mL/min (ref >60)
Glucose, Bld: 101 mg/dL — ABNORMAL HIGH (ref 65–99)
Potassium: 3.8 mmol/L (ref 3.5–5.1)
Sodium: 137 mmol/L (ref 135–145)
Total Bilirubin: 0.8 mg/dL (ref 0.3–1.2)
Total Protein: 7.5 g/dL (ref 6.5–8.1)

## 2015-12-31 LAB — FERRITIN: Ferritin: 471 ng/mL — ABNORMAL HIGH (ref 11–307)

## 2015-12-31 MED ORDER — HEPARIN SOD (PORK) LOCK FLUSH 100 UNIT/ML IV SOLN
500.0000 [IU] | Freq: Once | INTRAVENOUS | Status: AC
  Administered 2015-12-31: 500 [IU] via INTRAVENOUS

## 2015-12-31 MED ORDER — SODIUM CHLORIDE 0.9% FLUSH
10.0000 mL | INTRAVENOUS | Status: DC | PRN
  Administered 2015-12-31: 10 mL via INTRAVENOUS
  Filled 2015-12-31: qty 10

## 2015-12-31 MED ORDER — ONDANSETRON HCL 8 MG PO TABS: 8.0000 mg | ORAL_TABLET | Freq: Three times a day (TID) | ORAL | Status: DC | PRN

## 2015-12-31 MED ORDER — OMEPRAZOLE 40 MG PO CPDR: 40.0000 mg | DELAYED_RELEASE_CAPSULE | Freq: Every day | ORAL | Status: DC

## 2015-12-31 MED ORDER — SODIUM CHLORIDE 0.9 % IV SOLN
INTRAVENOUS | Status: DC
  Administered 2015-12-31: 10:00:00 via INTRAVENOUS

## 2015-12-31 NOTE — Patient Instructions (Signed)
[facility] Discharge Instructions for Patients Receiving Chemotherapy   Beginning January 23rd 2017 lab work for the [facility] will be done in the  Main lab at [facility] on 1st floor. If you have a lab appointment with the [facility] please come in thru the  Main Entrance and check in at the main information desk   Today you received one liter of normal saline over 2 hours. Return as scheduled for office visit and chemotherapy.  To help prevent nausea and vomiting after your treatment, we encourage you to take your nausea medication as prescribed.  If you develop nausea and vomiting, or diarrhea that is not controlled by your medication, call the clinic.  The clinic phone number is [phone]. Office hours are Monday-Friday 8:30am-5:00pm.  BELOW ARE SYMPTOMS THAT SHOULD BE REPORTED IMMEDIATELY:  *FEVER GREATER THAN 101.0 F  *CHILLS WITH OR WITHOUT FEVER  NAUSEA AND VOMITING THAT IS NOT CONTROLLED WITH YOUR NAUSEA MEDICATION  *UNUSUAL SHORTNESS OF BREATH  *UNUSUAL BRUISING OR BLEEDING  TENDERNESS IN MOUTH AND THROAT WITH OR WITHOUT PRESENCE OF ULCERS  *URINARY PROBLEMS  *BOWEL PROBLEMS  UNUSUAL RASH Items with * indicate a potential emergency and should be followed up as soon as possible. If you have an emergency after office hours please contact your primary care physician or go to the nearest emergency department.  Please call the clinic during office hours if you have any questions or concerns.   You may also contact the Patient Navigator at [phone] should you have any questions or need assistance in obtaining follow up care.      Resources For Cancer Patients and their Caregivers ? American Cancer Society: Can assist with transportation, wigs, general needs, runs Look Good Feel Better.        ([phone] 603-6034 ? Cancer Care: Provides financial assistance, online support groups, medication/co-pay assistance.  1-800-813-HOPE  845-053-2193) ? [facility] Assists [place] cancer patients and their families through emotional , educational and financial support.  9493949316 ? [place] DSS Where to apply for food stamps, Medicaid and utility assistance. (705)151-1906 ? [employer]: Transportation to medical appointments. (815) 541-6822 ? Social Security Administration: May apply for disability if have a Stage IV cancer. 620-755-4457 479-297-4273 ? [employer], Disability and Transit Services: Assists with nutrition, care and transit needs. [phone]

## 2015-12-31 NOTE — Assessment & Plan Note (Signed)
Iron deficiency anemia at the time of presentation with breast cancer.  S/P 1 dose of IV Feraheme:  Oncology Flowsheet 11/02/2015  ferumoxytol [facility]) IV 510 mg   Labs today: Ferritin.

## 2015-12-31 NOTE — Progress Notes (Signed)
Patient identified by PA to be struggling with hydration  Contacted Pt by visiting during infusion. Translator was present. Pt notes she is able to read [place] handouts  Wt Readings from Last 10 Encounters:  12/31/15 134 lb 11.2 oz (61.1 kg)  12/24/15 142 lb (64.411 kg)  12/09/15 144 lb (65.318 kg)  12/04/15 144 lb (65.318 kg)  11/09/15 140 lb 11.2 oz (63.821 kg)  10/22/15 136 lb 14.4 oz (62.097 kg)  10/21/15 139 lb 8 oz (63.277 kg)  08/26/15 137 lb 8 oz (62.37 kg)   Patients weight history is slightly odd as she weighed 137 early this year and then had a gain to 144. Now she is 134. Her highest weight was taken during admission and are likely not most accurate. Unfortunately, she does not know her UBW   Even if she has not lost as much weight as appears, she is eating very little. SHe eats 1-2 x a day and these are not large meals. She drinks a bottle or two of water a day  RD was specifically asked to order patient Ensure. However, when talking with pt, she says she has tried a couple ensure samples and these made her nauseous. She also tried the clears (breeze) and this was much better tolerated. She stated the flavor as so-so. She thought she could drink these daily. Only abott products are available, gave available flavors of Ensure Clear. Chose apple. Will order.   This supplement should help with her poor PO intake, but may not be the best at hydration due to high sugar content. RD went over better hydration choices ie water, low sugar sports drink, milk. Talked about ORS, how they work, where to find commercial brands and gave recipe with instructions on how to make her own.   Reiterated what color her urine should be and signs of dehydration ie dizziness, dry mouth, cracked lips, fatigue. The Ensure clear is for kcal/pro and there are better beverage choices if she thinks she is dehydrated  Will follow up at her appointment next week.   Burtis Junes RD, LDN Nutrition Pager:  [phone] 12/31/2015 3:03 PM

## 2015-12-31 NOTE — Progress Notes (Signed)
Michele Dryer, PA-C Michele Salas [id number]  Cancer of central portion of left female breast [facility]) - Plan: CBC with Differential, Comprehensive metabolic panel, ondansetron (ZOFRAN) 8 MG tablet  Iron deficiency anemia - Plan: CBC with Differential, Ferritin  Non-intractable vomiting with nausea, unspecified vomiting type  Epigastric pain - Plan: omeprazole (PRILOSEC) 40 MG capsule  Poor fluid intake - Plan: DISCONTINUED: 0.9 %  sodium chloride infusion  Dizziness - Plan: DISCONTINUED: 0.9 %  sodium chloride infusion  CURRENT THERAPY: AC every 2 weeks x 4 cycles beginning on 12/25/2015  INTERVAL HISTORY: Michele Salas 35 y.o. 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 Arnoldo Morale on 12/09/2015.  Now on systemic adjuvant chemotherapy.    Cancer of central portion of left female breast ([place])   09/16/2015 Mammogram 1.6 cm retroareolar left breast mass measuring 1.6 cm in largest diameter in addition to 3 abnormal appearing left inferior axillary lymph nodes.   09/23/2015 Initial Biopsy US guided core biopsy of left breast mass and suspicious left axillary lymph nodes.   09/23/2015 Pathology Results Invasive ductal carcinoma, high grade.  Left axilla lymph node is positive for metastatic carcinoma.  ER/PR NEGATIVE, HER2 NEGATIVE, KI-67 marker of 85%.   10/26/2015 Imaging Bone scan- No evidence of metastatic disease to the skeleton.   10/27/2015 Imaging CT CAP-  Retroareolar mass with 2 asymmetrically prominent left axillary lymph nodes and an upper normal size left subpectoral lymph node. No other findings of metastatic disease in the chest, abdomen, or pelvis.   11/05/2015 Echocardiogram The estimated ejection fraction was in the range of 60% to 65%.   11/23/2015 Miscellaneous Genetic Counseling- genetic test result was negative  for any known pathogenic mutations within any of 28 genes that would cause her to be at an increased genetic risk for breast, ovarian, or other related cancers.   12/09/2015 Surgery L radical mastectomy, axillary LN dissection, port a cath insertion with Dr. Aviva Signs 12/09/2015   12/11/2015 Pathology Results - INVASIVE GRADE III DUCTAL CARCINOMA, SPANNING 2.3 CM IN GREATEST DIMENSION. - ASSOCIATED HIGH GRADE DCIS - MARGINS ARE NEGATIVE. 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 - EIGHT ADDITIONAL LYMPH NODES WITH NO TUMOR SEEN   12/11/2015 Pathology Results HER2 NEGATIVE, ER 0%, PR 0%, Ki-67 marker 90%   12/25/2015 -  Chemotherapy AC every 2 weeks x 4 cycles    I personally reviewed and went over laboratory results with the patient.  The results are noted within this dictation.  Labs will be updated today.  The patient reports a few complaints:  1. Epigastric abdominal pain-she notes epigastric abdominal pain occurring on Saturday, 12/26/2015. It lasted for approximately 20-25 minutes and resolved spontaneously. She notes continued mild discomfort that is exacerbated with palpation but without palpation, abdominal pain is nonexistent.  She described the pain as "strong."  She denies any diarrhea or constipation.  No abdominal pain today.  2. Decreased appetite and oral fluid intake-she reports 1-2 bottles of water daily. This is below baseline for her. She notes dizziness and I suspect she is dehydrated. Her systolic and diastolic blood pressure below baseline today. She is afebrile. Pulse is at baseline. Her weight is down approximately 8 pounds compared to 12/24/2015.  However, going back in time to March 2017, her weight is down approximately 2  pounds. I will send Burtis Junes a message requesting ensure.  She has been seen by Burtis Junes, RD.  He will order her clear ensure.  She notes that her appetite is down in addition to taste changes in food.  She reports having fruit for  breakfast and soup for dinner yesterday only.    3. Nausea with 1 episode of vomiting-  She reports 1 episode of emesis on Friday.  She reports that she has been taking her home anti-emetic as needed and it is effective for her.  She notes that her nausea resolved on Sunday 5/21.    I addressed sexual intercourse with the patient.  She admits to no sexual contact.  She is provided education on sexual abstinence during this time of chemotherapy.    Review of Systems  Constitutional: Positive for weight loss and malaise/fatigue. Negative for fever and chills.  HENT: Negative.  Negative for congestion and sore throat.   Eyes: Negative.  Negative for blurred vision and double vision.  Respiratory: Negative.  Negative for cough, hemoptysis, sputum production, shortness of breath and wheezing.   Cardiovascular: Negative.  Negative for chest pain and leg swelling.  Gastrointestinal: Positive for nausea (x 3 days resolving on Sunday 12/27/2015), vomiting (1 time) and abdominal pain (Epigastric pain x 1 time lasting 20-25 minutes resolving spontaneously.  None now.  Exacerbated with palpation. ). Negative for heartburn, diarrhea, constipation, blood in stool and melena.  Genitourinary: Negative.  Negative for dysuria, urgency, frequency and hematuria.  Musculoskeletal: Negative.  Negative for falls.  Skin: Negative.  Negative for rash.  Neurological: Positive for dizziness.  Endo/Heme/Allergies: Negative.   Psychiatric/Behavioral: Negative.     Past Medical History  Diagnosis Date  . GERD (gastroesophageal reflux disease)   . Breast cancer, left ([place]) 10/22/2015  . Iron deficiency anemia 10/27/2015    Past Surgical History  Procedure Laterality Date  . Mastectomy modified radical Left 12/09/2015    Procedure: LEFT MODIFIED RADICAL MASTECTOMY;  Surgeon: Aviva Signs, MD;  Location: [facility];  Service: General;  Laterality: Left;  . Portacath placement Right 12/09/2015    Procedure: INSERTION PORT-A-CATH  RIGHT SUBCLAVIAN;  Surgeon: Aviva Signs, MD;  Location: [facility];  Service: General;  Laterality: Right;    Family History  Problem Relation Age of Onset  . Diabetes Mother   . Uterine cancer Paternal Aunt 92  . Heart attack Paternal Uncle     unspecified age  . Heart attack Paternal Grandfather 71  . Other Paternal Uncle     stomach swollen with fluid that had to be removed; lim info    Social History   Social History  . Marital Status: Married    Spouse Name: N/A  . Number of Children: N/A  . Years of Education: N/A   Social History Main Topics  . Smoking status: Passive Smoke Exposure - Never Smoker  . Smokeless tobacco: Never Used  . Alcohol Use: No  . Drug Use: No  . Sexual Activity: Yes    Birth Control/ Protection: None   Other Topics Concern  . None   Social History Narrative     PHYSICAL EXAMINATION  ECOG PERFORMANCE STATUS: 1 - Symptomatic but completely ambulatory  Filed Vitals:   12/31/15 0856  BP: 117/57  Pulse: 78  Temp: 98 F (36.7 C)  Resp: 17    GENERAL:alert, no distress, well nourished, well developed, cooperative and accompanied by 1 family member and interpreter. SKIN: skin color, texture, turgor are normal, no  rashes or significant lesions HEAD: Normocephalic, No masses, lesions, tenderness or abnormalities EYES: normal, Conjunctiva are pink and non-injected EARS: External ears normal OROPHARYNX:lips, buccal mucosa, and tongue normal and mucous membranes are moist  NECK: supple, no adenopathy, thyroid normal size, non-tender, without nodularity, trachea midline LYMPH:  no palpable lymphadenopathy BREAST:not examined LUNGS: clear to auscultation and percussion HEART: regular rate & rhythm, no murmurs, no gallops, S1 normal and S2 normal ABDOMEN:abdomen soft, normal bowel sounds and mild tenderness on palpation of epigastric region. BACK: Back symmetric, no curvature., No CVA tenderness EXTREMITIES:less then 2 second capillary refill,  no joint deformities, effusion, or inflammation, no edema, no skin discoloration, no clubbing, no cyanosis  NEURO: alert & oriented x 3 with fluent speech, no focal motor/sensory deficits, gait normal   LABORATORY DATA: CBC    Component Value Date/Time   WBC 1.2* 12/31/2015 1015   RBC 4.34 12/31/2015 1015   HGB 11.8* 12/31/2015 1015   HCT 35.4* 12/31/2015 1015   PLT 185 12/31/2015 1015   MCV 81.6 12/31/2015 1015   MCH 27.2 12/31/2015 1015   MCHC 33.3 12/31/2015 1015   RDW 18.0* 12/31/2015 1015   LYMPHSABS 0.8 12/31/2015 1015   MONOABS 0.1 12/31/2015 1015   EOSABS 0.1 12/31/2015 1015   BASOSABS 0.0 12/31/2015 1015      Chemistry      Component Value Date/Time   NA 137 12/31/2015 1015   K 3.8 12/31/2015 1015   CL 103 12/31/2015 1015   CO2 26 12/31/2015 1015   BUN 10 12/31/2015 1015   CREATININE 0.40* 12/31/2015 1015      Component Value Date/Time   CALCIUM 9.0 12/31/2015 1015   ALKPHOS 112 12/31/2015 1015   AST 21 12/31/2015 1015   ALT 67* 12/31/2015 1015   BILITOT 0.8 12/31/2015 1015      Lab Results  Component Value Date   FERRITIN 4* 10/26/2015     PENDING LABS:   RADIOGRAPHIC STUDIES:  Chest 2 View  12/04/2015  CLINICAL DATA:  Left breast cancer EXAM: CHEST  2 VIEW COMPARISON:  CT 10/27/2015 FINDINGS: Calcified granuloma in the right upper lobe, stable. Heart is normal size. No confluent opacities or effusions. No acute bony abnormality. IMPRESSION: No active cardiopulmonary disease. Electronically Signed   By: Rolm Baptise M.D.   On: 12/04/2015 11:53   Ir Cv Line Injection  12/24/2015  INDICATION: History of left-sided breast cancer, post surgical placement of a right subclavian vein approach port a catheter. Patient now with pain during chemotherapy administration via the [facility] a Catheter. Please perform fluoroscopic guided Port a catheter injection. EXAM: FLUOROSCOPIC GUIDED PORT A CATHETER INJECTION COMPARISON:  Chest radiograph - 12/09/2015 MEDICATIONS:  None ANESTHESIA/SEDATION: None CONTRAST:  20 cc Isovue-300 FLUOROSCOPY TIME:  12 seconds (62 mGy) COMPLICATIONS: None immediate. PROCEDURE: The patient's right anterior chest wall subclavian vein approach port a catheter had been previously accessed. The patient was placed supine on the fluoroscopy table. A preprocedural spot radiographic image was obtained of the existing right subclavian vein approach port a catheter with tip overlying the distal aspect of the SVC. Port a Catheter was noted to easily aspirate. Multiple spot fluoroscopic and angiographic images were obtained in various obliquities following the injection of a small amount of contrast via the Port a catheter. Images were reviewed and the procedure was terminated. The Port a catheter was flushed with a heparin dwell and left accessed (per patient request given scheduled chemotherapy session for tomorrow). A dressing was placed. The patient  tolerated the procedure well without immediate postprocedural complication. FINDINGS: The patient's existing right subclavian vein approach port a catheter tip terminates within the distal aspect of this SVC. There is no evidence of catheter kink or fracture. Contrast injection confirms appropriate functionality of the Port a catheter without evidence of fibrin sheath formation about the catheter tip. There is no evidence of contrast extravasation about the catheter tubing. IMPRESSION: Appropriately positioned and functioning right subclavian vein approach port a catheter without evidence of fibrin sheath formation, catheter kink or fracture. The Port a catheter is ready for immediate use. Electronically Signed   By: Sandi Mariscal M.D.   On: 12/24/2015 12:53   Dg Chest Port 1 View  12/09/2015  CLINICAL DATA:  Port-A-Cath insertion. New history of breast cancer. EXAM: PORTABLE CHEST - 1 VIEW; DG C-ARM 1-60 MIN-NO REPORT COMPARISON:  12/04/2015 FINDINGS: Power port appears in good position. No pneumothorax. Lungs are  clear. Heart size and vascularity are normal. Surgical drain in the left anterior chest from recent mastectomy. IMPRESSION: Power port in good position.  Clear lungs.  No pneumothorax. Electronically Signed   By: Lorriane Shire M.D.   On: 12/09/2015 12:13   Dg C-arm 1-60 Min-no Report  12/09/2015  CLINICAL DATA: left breast cancer C-ARM 1-60 MINUTES Fluoroscopy was utilized by the requesting physician.  No radiographic interpretation.     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 Arnoldo Morale on 12/09/2015.  Now on systemic adjuvant chemotherapy.  Oncology history is updated.  Pathologic staging is completed in [facility] problem list.  Labs today for NADIR check: CBC diff, CMET  Orthostatic BP/P:  Laying 1020 hrs: 107/64, 74  Sitting 1022 hrs: 112/71, 79  Standing 1024: 115/78, 83  Given her lack of H2O intake, her dizziness, and BP, I will give her 1 L of NS at 500 cc/hr.  We discussed the importance of H2O intake and she is encouraged to 2-3x her H2O intake per day.    Burtis Junes, RD has seen the patient today and recommended clear ensure products.  He will work on getting her a case of this.    I will refill her Zofran.  I suspect some PUD with her epigastric pain in the setting of chemotherapy.  I will start Omeprazole 40 mg PO daily.    She is strongly urged to abstain from sexual intercourse.  Return next week for cycle #2 of treatment and follow-up appointment as scheduled.  Iron deficiency anemia Iron deficiency anemia at the time of presentation with breast cancer.  S/P 1 dose of IV Feraheme:  Oncology Flowsheet 11/02/2015  ferumoxytol [facility]) IV 510 mg   Labs today: Ferritin.    ORDERS PLACED FOR THIS ENCOUNTER: Orders Placed This Encounter  Procedures  . CBC with  Differential  . Comprehensive metabolic panel  . Ferritin    MEDICATIONS PRESCRIBED THIS ENCOUNTER: Meds ordered this encounter  Medications  . ondansetron (ZOFRAN) 8 MG tablet    Sig: Take 1 tablet (8 mg total) by mouth every 8 (eight) hours as needed for nausea or vomiting.    Dispense:  30 tablet    Refill:  2    Order Specific Question:  Supervising Provider    Answer:  Patrici Ranks [id number]  . omeprazole (PRILOSEC) 40 MG capsule  Sig: Take 1 capsule (40 mg total) by mouth daily.    Dispense:  30 capsule    Refill:  1    Order Specific Question:  Supervising Provider    Answer:  Patrici Ranks [id number]    THERAPY PLAN:  Continue adjuvant chemotherapy in a curative fashion as outlined above.  All questions were answered. The patient knows to call the clinic with any problems, questions or concerns. We can certainly see the patient much sooner if necessary.  Patient and plan discussed with Dr. Ancil Linsey and she is in agreement with the aforementioned.   This note is electronically signed by: Doy Mince 12/31/2015 3:32 PM

## 2015-12-31 NOTE — Assessment & Plan Note (Addendum)
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 Arnoldo Morale on 12/09/2015.  Now on systemic adjuvant chemotherapy.  Oncology history is updated.  Pathologic staging is completed in [facility] problem list.  Labs today for NADIR check: CBC diff, CMET  Orthostatic BP/P:  Laying 1020 hrs: 107/64, 74  Sitting 1022 hrs: 112/71, 79  Standing 1024: 115/78, 83  Given her lack of H2O intake, her dizziness, and BP, I will give her 1 L of NS at 500 cc/hr.  We discussed the importance of H2O intake and she is encouraged to 2-3x her H2O intake per day.    Burtis Junes, RD has seen the patient today and recommended clear ensure products.  He will work on getting her a case of this.    I will refill her Zofran.  I suspect some PUD with her epigastric pain in the setting of chemotherapy.  I will start Omeprazole 40 mg PO daily.    She is strongly urged to abstain from sexual intercourse.  Return next week for cycle #2 of treatment and follow-up appointment as scheduled.

## 2015-12-31 NOTE — Patient Instructions (Addendum)
[place] at [facility] Discharge Instructions  RECOMMENDATIONS MADE BY THE CONSULTANT AND ANY TEST RESULTS WILL BE SENT TO YOUR REFERRING PHYSICIAN.   Return as scheduled for next chemo and MD visit.   Pt to receive 1 Liter of Normal Saline over 2 hours  Prilosec prescribed.       Thank you for choosing [place] at [facility] to provide your oncology and hematology care.  To afford each patient quality time with our provider, please arrive at least 15 minutes before your scheduled appointment time.   Beginning January 23rd 2017 lab work for the [facility] will be done in the  Main lab at [facility] on 1st floor. If you have a lab appointment with the [facility] please come in thru the  Main Entrance and check in at the main information desk  You need to re-schedule your appointment should you arrive 10 or more minutes late.  We strive to give you quality time with our providers, and arriving late affects you and other patients whose appointments are after yours.  Also, if you no show three or more times for appointments you may be dismissed from the clinic at the providers discretion.     Again, thank you for choosing [facility].  Our hope is that these requests will decrease the amount of time that you wait before being seen by our physicians.       _____________________________________________________________  Should you have questions after your visit to [facility], please contact our office at [phone] between the hours of 8:30 a.m. and 4:30 p.m.  Voicemails left after 4:30 p.m. will not be returned until the following business day.  For prescription refill requests, have your pharmacy contact our office.         Resources For Cancer Patients and their Caregivers ? American Cancer Society: Can assist with transportation, wigs, general needs, runs Look Good Feel Better.         825-598-0617 ? Cancer Care: Provides financial assistance, online support groups, medication/co-pay assistance.  1-800-813-HOPE (815)327-8359) ? [place] Assists [place] cancer patients and their families through emotional , educational and financial support.  332 707 8004 ? [place] DSS Where to apply for food stamps, Medicaid and utility assistance. (438)759-2488 ? [employer]: Transportation to medical appointments. 848 281 1506 ? Social Security Administration: May apply for disability if have a Stage IV cancer. 309 311 6319 936-416-8039 ? [employer], Disability and Transit Services: Assists with nutrition, care and transit needs. [place] Support Programs: @10RELATIVEDAYS @ > Cancer Support Group  2nd Tuesday of the month 1pm-2pm, Journey Room  > Creative Journey  3rd Tuesday of the month 1130am-1pm, Journey Room  > Look Good Feel Better  1st Wednesday of the month 10am-12 noon, Journey Room (Call [place] to register [phone])

## 2015-12-31 NOTE — Progress Notes (Signed)
1230:  Reports decreased dizziness after IVF infusion.  A&Ox4, in no distress.  Discharged ambulatory in c/o family for transport home.

## 2015-12-31 NOTE — Progress Notes (Signed)
CRITICAL VALUE ALERT Critical value received:  WBC 1.2 Date of notification:  12/31/15 Time of notification: 0475 Critical value read back:  Yes.   Nurse who received alert:  Isidoro Donning RN Dr. Whitney Muse and Gershon Mussel notified.

## 2016-01-07 ENCOUNTER — Encounter: Payer: Self-pay | Admitting: Dietician

## 2016-01-07 ENCOUNTER — Encounter (HOSPITAL_COMMUNITY): Payer: Self-pay | Attending: Hematology & Oncology

## 2016-01-07 ENCOUNTER — Encounter (HOSPITAL_COMMUNITY): Payer: Self-pay

## 2016-01-07 VITALS — BP 131/54 | HR 65 | Temp 97.8°F | Resp 18 | Wt 137.0 lb

## 2016-01-07 DIAGNOSIS — Z5112 Encounter for antineoplastic immunotherapy: Secondary | ICD-10-CM

## 2016-01-07 DIAGNOSIS — C773 Secondary and unspecified malignant neoplasm of axilla and upper limb lymph nodes: Secondary | ICD-10-CM

## 2016-01-07 DIAGNOSIS — C50112 Malignant neoplasm of central portion of left female breast: Secondary | ICD-10-CM | POA: Insufficient documentation

## 2016-01-07 DIAGNOSIS — D72825 Bandemia: Secondary | ICD-10-CM | POA: Insufficient documentation

## 2016-01-07 DIAGNOSIS — Z5111 Encounter for antineoplastic chemotherapy: Secondary | ICD-10-CM

## 2016-01-07 LAB — COMPREHENSIVE METABOLIC PANEL
ALT: 54 U/L (ref 14–54)
AST: 30 U/L (ref 15–41)
Albumin: 4.2 g/dL (ref 3.5–5.0)
Alkaline Phosphatase: 105 U/L (ref 38–126)
Anion gap: 8 (ref 5–15)
BUN: 7 mg/dL (ref 6–20)
CO2: 25 mmol/L (ref 22–32)
Calcium: 9.1 mg/dL (ref 8.9–10.3)
Chloride: 105 mmol/L (ref 101–111)
Creatinine, Ser: 0.36 mg/dL — ABNORMAL LOW (ref 0.44–1.00)
GFR calc Af Amer: >60 mL/min (ref >60)
GFR calc non Af Amer: >60 mL/min (ref >60)
Glucose, Bld: 108 mg/dL — ABNORMAL HIGH (ref 65–99)
Potassium: 3.9 mmol/L (ref 3.5–5.1)
Sodium: 138 mmol/L (ref 135–145)
Total Bilirubin: 0.3 mg/dL (ref 0.3–1.2)
Total Protein: 7 g/dL (ref 6.5–8.1)

## 2016-01-07 LAB — CBC WITH DIFFERENTIAL/PLATELET
Basophils Absolute: 0.1 10*3/uL (ref 0.0–0.1)
Basophils Relative: 1 %
Eosinophils Absolute: 0.1 10*3/uL (ref 0.0–0.7)
Eosinophils Relative: 1 %
HCT: 33.8 % — ABNORMAL LOW (ref 36.0–46.0)
Hemoglobin: 11.2 g/dL — ABNORMAL LOW (ref 12.0–15.0)
Lymphocytes Relative: 13 %
Lymphs Abs: 2.1 10*3/uL (ref 0.7–4.0)
MCH: 27.5 pg (ref 26.0–34.0)
MCHC: 33.1 g/dL (ref 30.0–36.0)
MCV: 83 fL (ref 78.0–100.0)
Monocytes Absolute: 0.9 10*3/uL (ref 0.1–1.0)
Monocytes Relative: 5 %
Neutro Abs: 13.6 10*3/uL — ABNORMAL HIGH (ref 1.7–7.7)
Neutrophils Relative %: 81 %
Platelets: 178 10*3/uL (ref 150–400)
RBC: 4.07 MIL/uL (ref 3.87–5.11)
RDW: 18.6 % — ABNORMAL HIGH (ref 11.5–15.5)
WBC: 16.8 10*3/uL — ABNORMAL HIGH (ref 4.0–10.5)

## 2016-01-07 MED ORDER — PALONOSETRON HCL INJECTION 0.25 MG/5ML
0.2500 mg | Freq: Once | INTRAVENOUS | Status: AC
  Administered 2016-01-07: 0.25 mg via INTRAVENOUS
  Filled 2016-01-07: qty 5

## 2016-01-07 MED ORDER — SODIUM CHLORIDE 0.9 % IV SOLN
Freq: Once | INTRAVENOUS | Status: AC
  Administered 2016-01-07: 12:00:00 via INTRAVENOUS
  Filled 2016-01-07: qty 5

## 2016-01-07 MED ORDER — SODIUM CHLORIDE 0.9 % IV SOLN
Freq: Once | INTRAVENOUS | Status: AC
  Administered 2016-01-07: 11:00:00 via INTRAVENOUS

## 2016-01-07 MED ORDER — HEPARIN SOD (PORK) LOCK FLUSH 100 UNIT/ML IV SOLN
500.0000 [IU] | Freq: Once | INTRAVENOUS | Status: AC | PRN
  Administered 2016-01-07: 500 [IU]
  Filled 2016-01-07: qty 5

## 2016-01-07 MED ORDER — SODIUM CHLORIDE 0.9 % IV SOLN
600.0000 mg/m2 | Freq: Once | INTRAVENOUS | Status: AC
  Administered 2016-01-07: 1000 mg via INTRAVENOUS
  Filled 2016-01-07: qty 50

## 2016-01-07 MED ORDER — SODIUM CHLORIDE 0.9% FLUSH
10.0000 mL | INTRAVENOUS | Status: DC | PRN
  Administered 2016-01-07: 10 mL
  Filled 2016-01-07: qty 10

## 2016-01-07 MED ORDER — HEPARIN SOD (PORK) LOCK FLUSH 100 UNIT/ML IV SOLN
INTRAVENOUS | Status: AC
  Filled 2016-01-07: qty 5

## 2016-01-07 MED ORDER — DOXORUBICIN HCL CHEMO IV INJECTION 2 MG/ML
60.0000 mg/m2 | Freq: Once | INTRAVENOUS | Status: AC
  Administered 2016-01-07: 100 mg via INTRAVENOUS
  Filled 2016-01-07: qty 50

## 2016-01-07 MED ORDER — PEGFILGRASTIM 6 MG/0.6ML ~~LOC~~ PSKT
6.0000 mg | PREFILLED_SYRINGE | Freq: Once | SUBCUTANEOUS | Status: AC
  Administered 2016-01-07: 6 mg via SUBCUTANEOUS
  Filled 2016-01-07: qty 0.6

## 2016-01-07 NOTE — Patient Instructions (Signed)
[facility] Discharge Instructions for Patients Receiving Chemotherapy   Beginning January 23rd 2017 lab work for the [facility] will be done in the  Main lab at [facility] on 1st floor. If you have a lab appointment with the [place] please come in thru the  Main Entrance and check in at the main information desk   Today you received the following chemotherapy agents:  Adriamycin and Cytoxan  If you develop nausea and vomiting, or diarrhea that is not controlled by your medication, call the clinic.  The clinic phone number is [phone]. Office hours are Monday-Friday 8:30am-5:00pm.  BELOW ARE SYMPTOMS THAT SHOULD BE REPORTED IMMEDIATELY:  *FEVER GREATER THAN 101.0 F  *CHILLS WITH OR WITHOUT FEVER  NAUSEA AND VOMITING THAT IS NOT CONTROLLED WITH YOUR NAUSEA MEDICATION  *UNUSUAL SHORTNESS OF BREATH  *UNUSUAL BRUISING OR BLEEDING  TENDERNESS IN MOUTH AND THROAT WITH OR WITHOUT PRESENCE OF ULCERS  *URINARY PROBLEMS  *BOWEL PROBLEMS  UNUSUAL RASH Items with * indicate a potential emergency and should be followed up as soon as possible. If you have an emergency after office hours please contact your primary care physician or go to the nearest emergency department.  Please call the clinic during office hours if you have any questions or concerns.   You may also contact the Patient Navigator at [phone] should you have any questions or need assistance in obtaining follow up care.      Resources For Cancer Patients and their Caregivers ? American Cancer Society: Can assist with transportation, wigs, general needs, runs Look Good Feel Better.        330-478-9227 ? Cancer Care: Provides financial assistance, online support groups, medication/co-pay assistance.  1-800-813-HOPE 225-162-5068) ? [place] Assists [place] cancer patients and their families through emotional , educational and financial support.   220-068-5926 ? [place] DSS Where to apply for food stamps, Medicaid and utility assistance. (989)593-3436 ? [employer]: Transportation to medical appointments. 409 479 4072 ? Social Security Administration: May apply for disability if have a Stage IV cancer. 403-822-1956 5673873291 ? [employer], Disability and Transit Services: Assists with nutrition, care and transit needs. [phone]

## 2016-01-07 NOTE — Progress Notes (Signed)
Following up with pt from last visit to see if her intake has improved  Contacted Pt by Visiting during infusion. Translator is present.  Wt Readings from Last 10 Encounters:  01/07/16 137 lb (62.143 kg)  12/31/15 134 lb 11.2 oz (61.1 kg)  12/24/15 142 lb (64.411 kg)  12/09/15 144 lb (65.318 kg)  12/04/15 144 lb (65.318 kg)  11/09/15 140 lb 11.2 oz (63.821 kg)  10/22/15 136 lb 14.4 oz (62.097 kg)  10/21/15 139 lb 8 oz (63.277 kg)  08/26/15 137 lb 8 oz (62.37 kg)   Patient weight has increased by 3 lbs since last visit  Patient reports oral intake as better. She says she is eating ~ 3 meals a day vs the 1-2 she stated last week. She picked up the Ensure Clear case and has been drinking 2 a day.   Denies any n/v/c/d.  She says she is making sure to drink adequate fluids and knows what color her urine should be.  From what she states today, she has completely turned around from last visit. She is seen smiling and much more well-appearing.   She has ~ 16 Ensure cans left. Will order a new one for her in a week or so.   Reiterated she can ask for dietitian follow up whenever shed like.  Burtis Junes RD, LDN Nutrition Pager: [phone] 01/07/2016 1:32 PM

## 2016-01-15 ENCOUNTER — Encounter (HOSPITAL_COMMUNITY): Payer: Self-pay | Admitting: Oncology

## 2016-01-21 ENCOUNTER — Encounter (HOSPITAL_COMMUNITY): Payer: Self-pay

## 2016-01-21 ENCOUNTER — Encounter (HOSPITAL_BASED_OUTPATIENT_CLINIC_OR_DEPARTMENT_OTHER): Payer: Self-pay

## 2016-01-21 ENCOUNTER — Encounter (HOSPITAL_BASED_OUTPATIENT_CLINIC_OR_DEPARTMENT_OTHER): Payer: Self-pay | Admitting: Hematology & Oncology

## 2016-01-21 VITALS — BP 122/51 | HR 71 | Temp 97.9°F | Resp 16 | Wt 136.0 lb

## 2016-01-21 DIAGNOSIS — C773 Secondary and unspecified malignant neoplasm of axilla and upper limb lymph nodes: Secondary | ICD-10-CM

## 2016-01-21 DIAGNOSIS — D72825 Bandemia: Secondary | ICD-10-CM

## 2016-01-21 DIAGNOSIS — R112 Nausea with vomiting, unspecified: Secondary | ICD-10-CM

## 2016-01-21 DIAGNOSIS — R110 Nausea: Secondary | ICD-10-CM

## 2016-01-21 DIAGNOSIS — C50112 Malignant neoplasm of central portion of left female breast: Secondary | ICD-10-CM

## 2016-01-21 DIAGNOSIS — Z171 Estrogen receptor negative status [ER-]: Secondary | ICD-10-CM

## 2016-01-21 DIAGNOSIS — Z5112 Encounter for antineoplastic immunotherapy: Secondary | ICD-10-CM

## 2016-01-21 DIAGNOSIS — Z5111 Encounter for antineoplastic chemotherapy: Secondary | ICD-10-CM

## 2016-01-21 DIAGNOSIS — D509 Iron deficiency anemia, unspecified: Secondary | ICD-10-CM

## 2016-01-21 LAB — CBC WITH DIFFERENTIAL/PLATELET
Basophils Absolute: 0.1 10*3/uL (ref 0.0–0.1)
Basophils Relative: 1 %
Eosinophils Absolute: 0 10*3/uL (ref 0.0–0.7)
Eosinophils Relative: 0 %
HCT: 32.8 % — ABNORMAL LOW (ref 36.0–46.0)
Hemoglobin: 11.2 g/dL — ABNORMAL LOW (ref 12.0–15.0)
Lymphocytes Relative: 8 %
Lymphs Abs: 1.3 10*3/uL (ref 0.7–4.0)
MCH: 28.4 pg (ref 26.0–34.0)
MCHC: 34.1 g/dL (ref 30.0–36.0)
MCV: 83 fL (ref 78.0–100.0)
Monocytes Absolute: 1.2 10*3/uL — ABNORMAL HIGH (ref 0.1–1.0)
Monocytes Relative: 7 %
Neutro Abs: 15.2 10*3/uL — ABNORMAL HIGH (ref 1.7–7.7)
Neutrophils Relative %: 85 %
Platelets: 249 10*3/uL (ref 150–400)
RBC: 3.95 MIL/uL (ref 3.87–5.11)
RDW: 18.7 % — ABNORMAL HIGH (ref 11.5–15.5)
WBC Morphology: INCREASED
WBC: 17.9 10*3/uL — ABNORMAL HIGH (ref 4.0–10.5)

## 2016-01-21 LAB — URINALYSIS, ROUTINE W REFLEX MICROSCOPIC
Bilirubin Urine: NEGATIVE
Glucose, UA: NEGATIVE mg/dL
Hgb urine dipstick: NEGATIVE
Ketones, ur: NEGATIVE mg/dL
Leukocytes, UA: NEGATIVE
Nitrite: NEGATIVE
Protein, ur: NEGATIVE mg/dL
Specific Gravity, Urine: 1.015 (ref 1.005–1.030)
pH: 7 (ref 5.0–8.0)

## 2016-01-21 LAB — COMPREHENSIVE METABOLIC PANEL
ALT: 54 U/L (ref 14–54)
AST: 32 U/L (ref 15–41)
Albumin: 4.3 g/dL (ref 3.5–5.0)
Alkaline Phosphatase: 108 U/L (ref 38–126)
Anion gap: 5 (ref 5–15)
BUN: 6 mg/dL (ref 6–20)
CO2: 26 mmol/L (ref 22–32)
Calcium: 9.4 mg/dL (ref 8.9–10.3)
Chloride: 107 mmol/L (ref 101–111)
Creatinine, Ser: 0.43 mg/dL — ABNORMAL LOW (ref 0.44–1.00)
GFR calc Af Amer: >60 mL/min (ref >60)
GFR calc non Af Amer: >60 mL/min (ref >60)
Glucose, Bld: 103 mg/dL — ABNORMAL HIGH (ref 65–99)
Potassium: 4 mmol/L (ref 3.5–5.1)
Sodium: 138 mmol/L (ref 135–145)
Total Bilirubin: 0.4 mg/dL (ref 0.3–1.2)
Total Protein: 7.2 g/dL (ref 6.5–8.1)

## 2016-01-21 MED ORDER — SODIUM CHLORIDE 0.9% FLUSH: 10.0000 mL | INTRAVENOUS | Status: DC | PRN

## 2016-01-21 MED ORDER — SODIUM CHLORIDE 0.9 % IV SOLN
600.0000 mg/m2 | Freq: Once | INTRAVENOUS | Status: AC
  Administered 2016-01-21: 1000 mg via INTRAVENOUS
  Filled 2016-01-21: qty 50

## 2016-01-21 MED ORDER — HEPARIN SOD (PORK) LOCK FLUSH 100 UNIT/ML IV SOLN
500.0000 [IU] | Freq: Once | INTRAVENOUS | Status: AC | PRN
  Administered 2016-01-21: 500 [IU]
  Filled 2016-01-21: qty 5

## 2016-01-21 MED ORDER — SODIUM CHLORIDE 0.9 % IV SOLN
Freq: Once | INTRAVENOUS | Status: AC
  Administered 2016-01-21: 11:00:00 via INTRAVENOUS

## 2016-01-21 MED ORDER — PEGFILGRASTIM 6 MG/0.6ML ~~LOC~~ PSKT
PREFILLED_SYRINGE | SUBCUTANEOUS | Status: AC
  Filled 2016-01-21: qty 1.2

## 2016-01-21 MED ORDER — PEGFILGRASTIM 6 MG/0.6ML ~~LOC~~ PSKT
6.0000 mg | PREFILLED_SYRINGE | Freq: Once | SUBCUTANEOUS | Status: DC
Start: 2016-01-21 — End: 2016-01-21

## 2016-01-21 MED ORDER — FOSAPREPITANT DIMEGLUMINE INJECTION 150 MG
Freq: Once | INTRAVENOUS | Status: AC
  Administered 2016-01-21: 11:00:00 via INTRAVENOUS
  Filled 2016-01-21: qty 5

## 2016-01-21 MED ORDER — PALONOSETRON HCL INJECTION 0.25 MG/5ML
0.2500 mg | Freq: Once | INTRAVENOUS | Status: AC
  Administered 2016-01-21: 0.25 mg via INTRAVENOUS
  Filled 2016-01-21: qty 5

## 2016-01-21 MED ORDER — DOXORUBICIN HCL CHEMO IV INJECTION 2 MG/ML
60.0000 mg/m2 | Freq: Once | INTRAVENOUS | Status: AC
  Administered 2016-01-21: 100 mg via INTRAVENOUS
  Filled 2016-01-21: qty 50

## 2016-01-21 NOTE — Progress Notes (Signed)
[facility] Hematology/Oncology Progress Note  Name: Michele Salas      MRN: [id number]    Date: 01/21/2016 Time:10:59 AM   REFERRING PHYSICIAN:  Aviva Signs, MD (Gen Surg)  REASON FOR CONSULT:  Left sided breast cancer   DIAGNOSIS:  Invasive ductal carcinoma, high grade, of left breast.  HISTORY OF PRESENT ILLNESS:   [name] is a 35 year old Hispanic woman with a past medical history significant for headaches who is referred to the [facility] for a new diagnosis of left breast cancer.    Cancer of central portion of left female breast ([place])   09/16/2015 Mammogram 1.6 cm retroareolar left breast mass measuring 1.6 cm in largest diameter in addition to 3 abnormal appearing left inferior axillary lymph nodes.   09/23/2015 Initial Biopsy US guided core biopsy of left breast mass and suspicious left axillary lymph nodes.   09/23/2015 Pathology Results Invasive ductal carcinoma, high grade.  Left axilla lymph node is positive for metastatic carcinoma.  ER/PR NEGATIVE, HER2 NEGATIVE, KI-67 marker of 85%.   10/26/2015 Imaging Bone scan- No evidence of metastatic disease to the skeleton.   10/27/2015 Imaging CT CAP-  Retroareolar mass with 2 asymmetrically prominent left axillary lymph nodes and an upper normal size left subpectoral lymph node. No other findings of metastatic disease in the chest, abdomen, or pelvis.   11/05/2015 Echocardiogram The estimated ejection fraction was in the range of 60% to 65%.   11/23/2015 Miscellaneous Genetic Counseling- genetic test result was negative for any known pathogenic mutations within any of 28 genes that would cause her to be at an increased genetic risk for breast, ovarian, or other related cancers.   12/09/2015 Surgery L radical mastectomy, axillary LN dissection, port a cath insertion with Dr. Aviva Signs 12/09/2015   12/11/2015 Pathology Results - INVASIVE GRADE III DUCTAL CARCINOMA, SPANNING 2.3 CM IN  GREATEST DIMENSION. - ASSOCIATED HIGH GRADE DCIS - MARGINS ARE NEGATIVE. 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 - EIGHT ADDITIONAL LYMPH NODES WITH NO TUMOR SEEN   12/11/2015 Pathology Results HER2 NEGATIVE, ER 0%, PR 0%, Ki-67 marker 90%   12/25/2015 -  Chemotherapy AC every 2 weeks x 4 cycles   Mrs. Shoe returns to the [place] today accompanied by a female companion and a Optometrist. The translator serves as a primary source of information.  With the assistance of interpretation, [name] reports that she's good, feeling fine.  She isn't having any problems today. In general, she denies any complaints or comments. She doesn't think she needs anything else today.  When asked if she's experiencing any burning when she urinates, she denies it. She also denies belly pain, diarrhea, and problems breathing.  She says that her mood is good too, and confrms that she's eating well, and sleeping well.   She denies any nausea or vomiting, noting "not right now." Her translator reports that [name] says she gets nausea on the day of chemo and the following two days. When she gets this nausea, she says she takes the "pill", and notes that it helps a little bit, "un poco." The patient says she takes one nausea pill every 8 hours. She confirms that she has two pills, but it's unclear as to whether or not she is taking both of her nausea medications.  Largely, she denies any questions today and denies the need for refills. She denies constipation or diarrhea.  Review of Systems  Constitutional: Negative for fever, chills, weight loss, malaise/fatigue and diaphoresis.  HENT: Negative for congestion and sore throat.   Eyes: Negative for blurred vision, double vision and redness.  Respiratory: Negative for cough, hemoptysis, sputum production, shortness of breath and wheezing.   Cardiovascular: Negative for chest pain, palpitations and leg swelling.    Gastrointestinal: Positive for nausea. Negative for heartburn, vomiting, abdominal pain, diarrhea, constipation, blood in stool and melena.  Genitourinary: Negative for dysuria, frequency and hematuria.  Musculoskeletal: Negative for myalgias, back pain, joint pain, falls and neck pain.  Skin: Negative for itching and rash.  Neurological: Negative for dizziness, tingling, tremors, sensory change, speech change, focal weakness and weakness.  Endo/Heme/Allergies: Negative.   Psychiatric/Behavioral: Negative for depression, suicidal ideas, hallucinations and substance abuse. The patient is not nervous/anxious and does not have insomnia.    HORMONAL RISK FACTORS:  Menarche was at age 85.  First live birth at age 27.  OCP use for approximately not sure how long she took birth control for, but reports that she has not taken in 33 years.  Ovaries intact: yes.  Hysterectomy: no.  Menopausal status: premenopausal, but periods are irregular. HRT use: 0 years. Colonoscopy: n/a; not examined. Mammogram within the last year: this was her first mammogram. Number of breast biopsies: 1. Up to date with pelvic exams: yes. Any excessive radiation exposure in the past: no   PAST MEDICAL HISTORY:   Past Medical History  Diagnosis Date  . GERD (gastroesophageal reflux disease)   . Breast cancer, left ([place]) 10/22/2015  . Iron deficiency anemia 10/27/2015    ALLERGIES: No Known Allergies    MEDICATIONS: I have reviewed the patient's current medications.    Current Outpatient Prescriptions on File Prior to Visit  Medication Sig Dispense Refill  . Cyclophosphamide (CYTOXAN IJ) Inject as directed. To be every 14 days x 4 treatments.    Marland Kitchen DOXOrubicin HCl (ADRIAMYCIN IV) Inject into the vein. To be every 14 days x 4 treatments.    . lidocaine-prilocaine (EMLA) cream Apply a quarter size amount to port site 1 hour prior to chemo. Do not rub in. Cover with plastic wrap. 30 g 3  . omeprazole  (PRILOSEC) 40 MG capsule Take 1 capsule (40 mg total) by mouth daily. 30 capsule 1  . ondansetron (ZOFRAN) 8 MG tablet Take 1 tablet (8 mg total) by mouth every 8 (eight) hours as needed for nausea or vomiting. 30 tablet 2  . oxyCODONE-acetaminophen (PERCOCET/ROXICET) 5-325 MG tablet Take 1-2 tablets by mouth every 4 (four) hours as needed for moderate pain. (Patient not taking: Reported on 12/31/2015) 40 tablet 0  . Pegfilgrastim (NEULASTA ONPRO [place]) Inject into the skin. To be given 27 hours after the completion of chemo.    . prochlorperazine (COMPAZINE) 10 MG tablet Take 1 tablet (10 mg total) by mouth every 6 (six) hours as needed for nausea or vomiting. (Patient not taking: Reported on 12/31/2015) 30 tablet 2  . propranolol (INDERAL) 20 MG tablet 1 po bid. Tome una tableta por boca dos veces diarias (Patient not taking: Reported on 12/31/2015) 60 tablet 3   Current Facility-Administered Medications on File Prior to Visit  Medication Dose Route Frequency Provider Last Rate Last Dose  . 0.9 %  sodium chloride infusion   Intravenous Once Patrici Ranks, MD      . cyclophosphamide (CYTOXAN) 1,000 mg in sodium chloride 0.9 % 250 mL chemo infusion  600 mg/m2 (Treatment Plan Actual) Intravenous Once [employer]  K Yoskar Murrillo, MD      . DOXOrubicin (ADRIAMYCIN) chemo injection 100 mg  60 mg/m2 (Treatment Plan Actual) Intravenous Once Patrici Ranks, MD      . fosaprepitant (EMEND) 150 mg, dexamethasone (DECADRON) 12 mg in sodium chloride 0.9 % 145 mL IVPB   Intravenous Once Patrici Ranks, MD      . heparin lock flush 100 unit/mL  500 Units Intracatheter Once PRN Patrici Ranks, MD      . palonosetron (ALOXI) injection 0.25 mg  0.25 mg Intravenous Once Patrici Ranks, MD      . pegfilgrastim (NEULASTA ONPRO KIT) injection 6 mg  6 mg Subcutaneous Once Patrici Ranks, MD      . sodium chloride flush (NS) 0.9 % injection 10 mL  10 mL Intracatheter PRN Patrici Ranks, MD         PAST SURGICAL  HISTORY Past Surgical History  Procedure Laterality Date  . Mastectomy modified radical Left 12/09/2015    Procedure: LEFT MODIFIED RADICAL MASTECTOMY;  Surgeon: Aviva Signs, MD;  Location: [facility];  Service: General;  Laterality: Left;  . Portacath placement Right 12/09/2015    Procedure: INSERTION PORT-A-CATH RIGHT SUBCLAVIAN;  Surgeon: Aviva Signs, MD;  Location: [facility];  Service: General;  Laterality: Right;    FAMILY HISTORY: Family History  Problem Relation Age of Onset  . Diabetes Mother   . Uterine cancer Paternal Aunt 26  . Heart attack Paternal Uncle     unspecified age  . Heart attack Paternal Grandfather 75  . Other Paternal Uncle     stomach swollen with fluid that had to be removed; lim info  Mother is alive at the age of 95. She has hypertension and diabetes. She lives in [place]. Father is deceased at unknown age and for unknown reason. She reports "he was old." Brothers 67 years old and healthy. She has many half siblings, many of whom live in [place]. She has 3 children. She is a 85 year old daughter who lives in [place], 19 year old son who lives here in [place], and 30 year old daughter who lives in [place]. All children are healthy.  SOCIAL HISTORY: Patient denies any tobacco abuse. Rarely does she have alcoholic beverage. She denies any illicit drug abuse. 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 She is religious and Catholic. She is not a documented citizen of the [place]. She is married.  PERFORMANCE STATUS: The patient's performance status is 0 - Asymptomatic   PHYSICAL EXAM:  Most Recent Vital Signs: Vitals - 1 value per visit 01/10/5408  SYSTOLIC 811  DIASTOLIC 51  Pulse 71  Temperature 97.9  Respirations 16  Weight (lb) 136  Height   BMI 24.87   General appearance: alert, cooperative, appears stated age, no distress and Hispanic, non-english speaking, accompanied by a translator and her husband Head:  Normocephalic, without obvious abnormality, atraumatic Eyes: negative findings: lids and lashes normal, conjunctivae and sclerae normal and corneas clear Throat: lips, mucosa, and tongue normal; teeth and gums normal Neck: no adenopathy, supple, symmetrical, trachea midline and thyroid not enlarged, symmetric, no tenderness/mass/nodules Lungs: clear to auscultation bilaterally and normal percussion bilaterally Breasts: Post mastectomy site is well healed Heart: regular rate and rhythm, S1, S2 normal, no murmur, click, rub or gallop Abdomen: soft, non-tender; bowel sounds normal; no masses,  no organomegaly Extremities: extremities normal, atraumatic, no cyanosis or edema Skin: Skin color, texture, turgor normal. No rashes or lesions Lymph nodes: Axillary adenopathy:  left anterior line adenopathy Neurologic: Alert and oriented X 3, normal strength and tone. Normal symmetric reflexes. Normal coordination and gait  LABORATORY DATA:  I have reviewed the data as listed.    Results for STEPHANIEMARIE, STOFFEL (MRN [id number]) as of 01/21/2016 10:48  Ref. Range 01/21/2016 09:50  Sodium Latest Ref Range: 135-145 mmol/L 138  Potassium Latest Ref Range: 3.5-5.1 mmol/L 4.0  Chloride Latest Ref Range: 101-111 mmol/L 107  CO2 Latest Ref Range: 22-32 mmol/L 26  BUN Latest Ref Range: 6-20 mg/dL 6  Creatinine Latest Ref Range: 0.44-1.00 mg/dL 0.43 (L)  Calcium Latest Ref Range: 8.9-10.3 mg/dL 9.4  EGFR (Non-African Amer.) Latest Ref Range: >60 mL/min >60  EGFR (African American) Latest Ref Range: >60 mL/min >60  Glucose Latest Ref Range: 65-99 mg/dL 103 (H)  Anion gap Latest Ref Range: 5-15  5  Alkaline Phosphatase Latest Ref Range: 38-126 U/L 108  Albumin Latest Ref Range: 3.5-5.0 g/dL 4.3  AST Latest Ref Range: 15-41 U/L 32  ALT Latest Ref Range: 14-54 U/L 54  Total Protein Latest Ref Range: 6.5-8.1 g/dL 7.2  Total Bilirubin Latest Ref Range: 0.3-1.2 mg/dL 0.4  WBC Latest Ref Range: 4.0-10.5 K/uL 17.9  (H)  RBC Latest Ref Range: 3.87-5.11 MIL/uL 3.95  Hemoglobin Latest Ref Range: 12.0-15.0 g/dL 11.2 (L)  HCT Latest Ref Range: 36.0-46.0 % 32.8 (L)  MCV Latest Ref Range: 78.0-100.0 fL 83.0  MCH Latest Ref Range: 26.0-34.0 pg 28.4  MCHC Latest Ref Range: 30.0-36.0 g/dL 34.1  RDW Latest Ref Range: 11.5-15.5 % 18.7 (H)  Platelets Latest Ref Range: 150-400 K/uL 249  Neutrophils Latest Units: % 85  Lymphocytes Latest Units: % 8  Monocytes Relative Latest Units: % 7  Eosinophil Latest Units: % 0  Basophil Latest Units: % 1  NEUT# Latest Ref Range: 1.7-7.7 K/uL 15.2 (H)  Lymphocyte # Latest Ref Range: 0.7-4.0 K/uL 1.3  Monocyte # Latest Ref Range: 0.1-1.0 K/uL 1.2 (H)  Eosinophils Absolute Latest Ref Range: 0.0-0.7 K/uL 0.0  Basophils Absolute Latest Ref Range: 0.0-0.1 K/uL 0.1  WBC Morphology Unknown INCREASED BANDS (...    RADIOGRAPHY: I have personally reviewed the radiological images as listed and agreed with the findings in the report.  Study Result     CLINICAL DATA: Left breast cancer staging workup.  EXAM: CT CHEST, ABDOMEN, AND PELVIS WITH CONTRAST  TECHNIQUE: Multidetector CT imaging of the chest, abdomen and pelvis was performed following the standard protocol during bolus administration of intravenous contrast.  CONTRAST: 121m OMNIPAQUE IOHEXOL 300 MG/ML SOLN  COMPARISON: 10/26/2015  FINDINGS: CT CHEST FINDINGS  Mediastinum/Nodes: No mediastinal or hilar adenopathy. No internal mammary adenopathy. A left axillary lymph node measures 1.3 cm in short axis on image 16/2. A second left axillary lymph node measures 1.1 cm in short axis on image 21/2. A left subpectoral lymph node measures 0.8 cm in short axis, image 12/2.  Lungs/Pleura: 5 mm densely calcified nodule in the right upper lobe has a faint margin of soft tissue density vague given the dense calcification and the presence of a small calcified prevascular node on the right, I favor this is  being a benign calcified granuloma. The lungs appear otherwise clear.  Musculoskeletal: No bony abnormality. Left breast retroareolar mass approximately 2 cm diameter, image 28 series 2.  CT ABDOMEN PELVIS FINDINGS  Hepatobiliary: Mildly contracted gallbladder. Otherwise unremarkable.  Pancreas: Unremarkable  Spleen: Unremarkable  Adrenals/Urinary Tract: Unremarkable  Stomach/Bowel: Unremarkable  Vascular/Lymphatic: Unremarkable  Reproductive: Indistinct endometrium up to 1.6 cm in thickness.  Adnexa unremarkable.  Other: No supplemental non-categorized findings.  Musculoskeletal: Small bilateral direct inguinal hernias containing adipose tissue. No findings of bony metastatic disease.  IMPRESSION: 1. Retroareolar mass with 2 asymmetrically prominent left axillary lymph nodes and an upper normal size left subpectoral lymph node. No other findings of metastatic disease in the chest, abdomen, or pelvis. 2. 5 mm fairly densely calcified nodule in the right upper lobe, favoring benign calcified granuloma. 3. Borderline endometrial thickening, although this may be normal in the secretory phase. 4. Incidental small bilateral direct inguinal hernias containing adipose tissue.   Electronically Signed  By: Van Clines M.D.  On: 10/27/2015 18:14   Study Result     CLINICAL DATA: New diagnosis of left-sided breast malignancy, evaluate for bony metastases.  EXAM: NUCLEAR MEDICINE WHOLE BODY BONE SCAN  TECHNIQUE: Whole body anterior and posterior images were obtained approximately 3 hours after intravenous injection of radiopharmaceutical.  RADIOPHARMACEUTICALS: 26.8 mCi Technetium-73mMDP IV  COMPARISON: None in PACs  FINDINGS: There is adequate uptake of the radiopharmaceutical by the skeleton. There is adequate soft tissue clearance and renal activity. Activity within the calvarium, spine, ribs, and pelvis is normal.  Activity within the upper and lower extremities is within the limits of normal as well.  IMPRESSION: No evidence of metastatic disease to the skeleton.   Electronically Signed  By: David JMartiniqueM.D.  On: 10/26/2015 12:53    PATHOLOGY:      [facility]  Biopsy of left breast mass at the 12:00 position demonstrates invasive ductal carcinoma, high-grade.  Left axillary lymph node biopsy demonstrates invasive ductal carcinoma, high-grade, consistent with metastasis to lymph node. Breast prognostic panel is performed demonstrating ER/PR negativity, HER-2/neu negativity, and a KI-67 Marker of 85%.  ASSESSMENT/PLAN:  Iron deficiency anemia She did well with IV iron replacement. She was severely iron deficiency and iron stores were replaced in preparation for chemotherapy. 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   WBC count is elevated today and I suspect based upon her lack of fever and symptoms it is secondary to neulasta. I am going to hold neulasta today. It may delay her next cycle from 2 to 3 weeks. This was discussed with the patient. We will obtain a urine sample from her today.  I want to make sure she is using both of her nausea medications, to alleviate her nausea on the days of chemo and the following two days.  Today is cycle #3 of AC. She will need TAXOL upcoming teachin  We will complete the paperwork she has brought in today. I advised her to pick it up early next week.  RTC in 2 weeks.  All questions were answered. The patient knows to call the clinic with any problems, questions or concerns. We can certainly see the patient much sooner if necessary.  This document serves as a record of services personally performed  by SAncil Linsey MD. It was created on her behalf by KToni Amend a trained medical scribe. The creation of this record is based on the scribe's personal observations and the provider's statements to them. This document has been checked and approved by the attending provider.  I have reviewed the above documentation for accuracy and completeness, and I agree with the above.  This note is electronically signed by: Molli Hazard, MD   01/21/2016 10:59 AM

## 2016-01-21 NOTE — Patient Instructions (Signed)
[place] at [facility] Discharge Instructions  RECOMMENDATIONS MADE BY THE CONSULTANT AND ANY TEST RESULTS WILL BE SENT TO YOUR REFERRING PHYSICIAN.  Exam done and seen today by Dr. Whitney Muse No neulasta today, Urine culture today. Treatment today. FMLA papers given to Angie today. Return to see the doctor in 2 weeks Please call the clinic if you have any questions or concerns  Thank you for choosing [place] at [facility] to provide your oncology and hematology care.  To afford each patient quality time with our provider, please arrive at least 15 minutes before your scheduled appointment time.   Beginning January 23rd 2017 lab work for the [facility] will be done in the  Main lab at [facility] on 1st floor. If you have a lab appointment with the [facility] please come in thru the  Main Entrance and check in at the main information desk  You need to re-schedule your appointment should you arrive 10 or more minutes late.  We strive to give you quality time with our providers, and arriving late affects you and other patients whose appointments are after yours.  Also, if you no show three or more times for appointments you may be dismissed from the clinic at the providers discretion.     Again, thank you for choosing [facility].  Our hope is that these requests will decrease the amount of time that you wait before being seen by our physicians.       _____________________________________________________________  Should you have questions after your visit to [facility], please contact our office at [phone] between the hours of 8:30 a.m. and 4:30 p.m.  Voicemails left after 4:30 p.m. will not be returned until the following business day.  For prescription refill requests, have your pharmacy contact our office.         Resources For Cancer Patients and their Caregivers ? American Cancer Society: Can  assist with transportation, wigs, general needs, runs Look Good Feel Better.        913-248-5411 ? Cancer Care: Provides financial assistance, online support groups, medication/co-pay assistance.  1-800-813-HOPE 3314577430) ? [place] Assists [place] cancer patients and their families through emotional , educational and financial support.  (207)300-0816 ? [place] DSS Where to apply for food stamps, Medicaid and utility assistance. 579-351-1032 ? RCATS: Transportation to medical appointments. (919)820-1055 ? Social Security Administration: May apply for disability if have a Stage IV cancer. ([phone] 006-5263 817-804-2427 ? [employer], Disability and Transit Services: Assists with nutrition, care and transit needs. [place] Support Programs: @10RELATIVEDAYS @ > Cancer Support Group  2nd Tuesday of the month 1pm-2pm, Journey Room  > Creative Journey  3rd Tuesday of the month 1130am-1pm, Journey Room  > Look Good Feel Better  1st Wednesday of the month 10am-12 noon, Journey Room (Call [place] to register [phone])

## 2016-01-21 NOTE — Patient Instructions (Signed)
[facility] Discharge Instructions for Patients Receiving Chemotherapy   Beginning January 23rd 2017 lab work for the [facility] will be done in the  Main lab at [facility] on 1st floor. If you have a lab appointment with the [place] please come in thru the  Main Entrance and check in at the main information desk   Today you received the following chemotherapy agents:  Doxorubicin and Cytoxan  If you develop nausea and vomiting, or diarrhea that is not controlled by your medication, call the clinic.  The clinic phone number is [phone]. Office hours are Monday-Friday 8:30am-5:00pm.  BELOW ARE SYMPTOMS THAT SHOULD BE REPORTED IMMEDIATELY:  *FEVER GREATER THAN 101.0 F  *CHILLS WITH OR WITHOUT FEVER  NAUSEA AND VOMITING THAT IS NOT CONTROLLED WITH YOUR NAUSEA MEDICATION  *UNUSUAL SHORTNESS OF BREATH  *UNUSUAL BRUISING OR BLEEDING  TENDERNESS IN MOUTH AND THROAT WITH OR WITHOUT PRESENCE OF ULCERS  *URINARY PROBLEMS  *BOWEL PROBLEMS  UNUSUAL RASH Items with * indicate a potential emergency and should be followed up as soon as possible. If you have an emergency after office hours please contact your primary care physician or go to the nearest emergency department.  Please call the clinic during office hours if you have any questions or concerns.   You may also contact the Patient Navigator at [phone] should you have any questions or need assistance in obtaining follow up care.      Resources For Cancer Patients and their Caregivers ? American Cancer Society: Can assist with transportation, wigs, general needs, runs Look Good Feel Better.        702-575-4655 ? Cancer Care: Provides financial assistance, online support groups, medication/co-pay assistance.  1-800-813-HOPE (226)066-1836) ? [place] Assists [place] cancer patients and their families through emotional , educational and financial support.   318-477-0813 ? [place] DSS Where to apply for food stamps, Medicaid and utility assistance. 757 343 2827 ? RCATS: Transportation to medical appointments. (757)348-5192 ? Social Security Administration: May apply for disability if have a Stage IV cancer. 548-225-2121 409-269-5612 ? [employer], Disability and Transit Services: Assists with nutrition, care and transit needs. 3525645271

## 2016-01-21 NOTE — Progress Notes (Signed)
1245:  Called to pt room; pt c/o nose burning, running, and chills.  Cytoxan infusion complete with NS infusing. VSS; a&Ox4, in no acute distress. PA notified - instructed to monitor pt for now.   1315:  A&Ox4, in no distress.  VSS.  Denies pain. Denies nose burning or chills.

## 2016-01-22 LAB — URINE CULTURE: Culture: 1000 — AB

## 2016-01-25 ENCOUNTER — Ambulatory Visit: Payer: Self-pay | Admitting: Physician Assistant

## 2016-02-04 ENCOUNTER — Other Ambulatory Visit (HOSPITAL_COMMUNITY): Payer: Self-pay | Admitting: Oncology

## 2016-02-04 ENCOUNTER — Encounter (HOSPITAL_COMMUNITY): Payer: Self-pay

## 2016-02-04 ENCOUNTER — Inpatient Hospital Stay (HOSPITAL_COMMUNITY): Payer: Self-pay

## 2016-02-04 ENCOUNTER — Ambulatory Visit (HOSPITAL_COMMUNITY): Payer: Self-pay | Admitting: Oncology

## 2016-02-04 ENCOUNTER — Encounter (HOSPITAL_BASED_OUTPATIENT_CLINIC_OR_DEPARTMENT_OTHER): Payer: Self-pay

## 2016-02-04 VITALS — BP 114/56 | HR 74 | Temp 98.0°F | Resp 16 | Wt 133.0 lb

## 2016-02-04 DIAGNOSIS — Z5112 Encounter for antineoplastic immunotherapy: Secondary | ICD-10-CM

## 2016-02-04 DIAGNOSIS — C50112 Malignant neoplasm of central portion of left female breast: Secondary | ICD-10-CM

## 2016-02-04 DIAGNOSIS — C773 Secondary and unspecified malignant neoplasm of axilla and upper limb lymph nodes: Secondary | ICD-10-CM

## 2016-02-04 LAB — COMPREHENSIVE METABOLIC PANEL
ALT: 46 U/L (ref 14–54)
AST: 26 U/L (ref 15–41)
Albumin: 4.1 g/dL (ref 3.5–5.0)
Alkaline Phosphatase: 83 U/L (ref 38–126)
Anion gap: 6 (ref 5–15)
BUN: 7 mg/dL (ref 6–20)
CO2: 26 mmol/L (ref 22–32)
Calcium: 9 mg/dL (ref 8.9–10.3)
Chloride: 107 mmol/L (ref 101–111)
Creatinine, Ser: 0.5 mg/dL (ref 0.44–1.00)
GFR calc Af Amer: >60 mL/min (ref >60)
GFR calc non Af Amer: >60 mL/min (ref >60)
Glucose, Bld: 117 mg/dL — ABNORMAL HIGH (ref 65–99)
Potassium: 3.6 mmol/L (ref 3.5–5.1)
Sodium: 139 mmol/L (ref 135–145)
Total Bilirubin: 0.4 mg/dL (ref 0.3–1.2)
Total Protein: 6.6 g/dL (ref 6.5–8.1)

## 2016-02-04 LAB — CBC WITH DIFFERENTIAL/PLATELET
Basophils Absolute: 0.1 10*3/uL (ref 0.0–0.1)
Basophils Relative: 4 %
Eosinophils Absolute: 0 10*3/uL (ref 0.0–0.7)
Eosinophils Relative: 3 %
HCT: 27.9 % — ABNORMAL LOW (ref 36.0–46.0)
Hemoglobin: 9.6 g/dL — ABNORMAL LOW (ref 12.0–15.0)
Lymphocytes Relative: 31 %
Lymphs Abs: 0.5 10*3/uL — ABNORMAL LOW (ref 0.7–4.0)
MCH: 28.7 pg (ref 26.0–34.0)
MCHC: 34.4 g/dL (ref 30.0–36.0)
MCV: 83.5 fL (ref 78.0–100.0)
Monocytes Absolute: 0.5 10*3/uL (ref 0.1–1.0)
Monocytes Relative: 31 %
Neutro Abs: 0.5 10*3/uL — ABNORMAL LOW (ref 1.7–7.7)
Neutrophils Relative %: 32 %
Platelets: 229 10*3/uL (ref 150–400)
RBC: 3.34 MIL/uL — ABNORMAL LOW (ref 3.87–5.11)
RDW: 17.9 % — ABNORMAL HIGH (ref 11.5–15.5)
WBC: 1.6 10*3/uL — ABNORMAL LOW (ref 4.0–10.5)

## 2016-02-04 MED ORDER — HEPARIN SOD (PORK) LOCK FLUSH 100 UNIT/ML IV SOLN
500.0000 [IU] | Freq: Once | INTRAVENOUS | Status: AC | PRN
  Administered 2016-02-04: 500 [IU]
  Filled 2016-02-04: qty 5

## 2016-02-04 MED ORDER — PROCHLORPERAZINE MALEATE 10 MG PO TABS: 10.0000 mg | ORAL_TABLET | Freq: Four times a day (QID) | ORAL | Status: DC | PRN

## 2016-02-04 MED ORDER — PALONOSETRON HCL INJECTION 0.25 MG/5ML
0.2500 mg | Freq: Once | INTRAVENOUS | Status: AC
  Administered 2016-02-04: 0.25 mg via INTRAVENOUS
  Filled 2016-02-04: qty 5

## 2016-02-04 MED ORDER — SODIUM CHLORIDE 0.9 % IV SOLN
Freq: Once | INTRAVENOUS | Status: AC
  Administered 2016-02-04: 11:00:00 via INTRAVENOUS

## 2016-02-04 MED ORDER — SODIUM CHLORIDE 0.9% FLUSH: 10.0000 mL | INTRAVENOUS | Status: DC | PRN

## 2016-02-04 MED ORDER — SODIUM CHLORIDE 0.9 % IV SOLN
Freq: Once | INTRAVENOUS | Status: AC
  Administered 2016-02-04: 11:00:00 via INTRAVENOUS
  Filled 2016-02-04: qty 5

## 2016-02-04 NOTE — Assessment & Plan Note (Deleted)
Iron deficiency anemia at the time of presentation with breast cancer.  S/P 1 dose of IV Feraheme:  Oncology Flowsheet 11/02/2015  ferumoxytol [facility]) IV 510 mg   Labs in 2 weeks: Ferritin.

## 2016-02-04 NOTE — Progress Notes (Signed)
1030:  Arrives to clinic c/o nausea and increased sensitivity to smells.  Reports she is only taking one of her nausea medications (Zofran) regularly, and that she does not have her Compazine.  States she has not taken anything for nausea today.  Compazine e-scribed to [facility]. Instructed pt to use BOTH nausea medications as prescribed at the first sign of nausea to help control her symptoms.  Dr. Whitney Muse notified of [address].  Will defer tx x 1 week. Pt notified and verbalizes understanding to return next week for [facility] #4.

## 2016-02-04 NOTE — Patient Instructions (Signed)
[place] at [facility] Discharge Instructions  RECOMMENDATIONS MADE BY THE CONSULTANT AND ANY TEST RESULTS WILL BE SENT TO YOUR REFERRING PHYSICIAN.  No chemotherapy today - your white blood cell count is too low for treatment. We will recheck your blood work next week and will treat you if everything looks good. Call the clinic should you have any questions or concerns prior to your next visit.    Thank you for choosing [place] at [facility] to provide your oncology and hematology care.  To afford each patient quality time with our provider, please arrive at least 15 minutes before your scheduled appointment time.   Beginning January 23rd 2017 lab work for the [facility] will be done in the  Main lab at [facility] on 1st floor. If you have a lab appointment with the [facility] please come in thru the  Main Entrance and check in at the main information desk  You need to re-schedule your appointment should you arrive 10 or more minutes late.  We strive to give you quality time with our providers, and arriving late affects you and other patients whose appointments are after yours.  Also, if you no show three or more times for appointments you may be dismissed from the clinic at the providers discretion.     Again, thank you for choosing [facility].  Our hope is that these requests will decrease the amount of time that you wait before being seen by our physicians.       _____________________________________________________________  Should you have questions after your visit to [facility], please contact our office at [phone] between the hours of 8:30 a.m. and 4:30 p.m.  Voicemails left after 4:30 p.m. will not be returned until the following business day.  For prescription refill requests, have your pharmacy contact our office.         Resources For Cancer Patients and their Caregivers ? American  Cancer Society: Can assist with transportation, wigs, general needs, runs Look Good Feel Better.        863-084-0732 ? Cancer Care: Provides financial assistance, online support groups, medication/co-pay assistance.  1-800-813-HOPE 707 338 3312) ? [facility] Assists [place] cancer patients and their families through emotional , educational and financial support.  ([phone] ? [place] DSS Where to apply for food stamps, Medicaid and utility assistance. ([phone] 191-3685 ? RCATS: Transportation to medical appointments. 639-356-0490 ? Social Security Administration: May apply for disability if have a Stage IV cancer. [phone] ? [employer], Disability and Transit Services: Assists with nutrition, care and transit needs. [place] Support Programs: @10RELATIVEDAYS @ > Cancer Support Group  2nd Tuesday of the month 1pm-2pm, Journey Room  > Creative Journey  3rd Tuesday of the month 1130am-1pm, Journey Room  > Look Good Feel Better  1st Wednesday of the month 10am-12 noon, Journey Room (Call [place] to register [phone])

## 2016-02-04 NOTE — Assessment & Plan Note (Deleted)
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 Arnoldo Morale on 12/09/2015.  Now on systemic adjuvant chemotherapy.  Oncology history is up to date.  Labs today: CBC diff, CMET.  I personally reviewed and went over laboratory results with the patient.  The results are noted within this dictation.  Treatment plan updated to reflex the 2nd part of her systemic chemotherapy that will consist of weekly Taxol x 12.  This aspect of her treatment is built.  I reviewed the risks, benefits, alternatives, and side effects of Taxol chemotherapy.  She has been educated on this medication with her chemotherapy teaching today.  She is strongly urged to abstain from sexual intercourse.  Return in 2 weeks for cycle 1 of Taxol and follow-up appointment.

## 2016-02-04 NOTE — Progress Notes (Signed)
No show Review of Systems

## 2016-02-11 ENCOUNTER — Encounter: Payer: Self-pay | Admitting: Physician Assistant

## 2016-02-12 ENCOUNTER — Encounter (HOSPITAL_COMMUNITY): Payer: Self-pay | Attending: Hematology & Oncology

## 2016-02-12 VITALS — BP 117/69 | HR 85 | Temp 98.3°F | Resp 18 | Wt 134.4 lb

## 2016-02-12 DIAGNOSIS — Z9889 Other specified postprocedural states: Secondary | ICD-10-CM | POA: Insufficient documentation

## 2016-02-12 DIAGNOSIS — Z79899 Other long term (current) drug therapy: Secondary | ICD-10-CM | POA: Insufficient documentation

## 2016-02-12 DIAGNOSIS — C773 Secondary and unspecified malignant neoplasm of axilla and upper limb lymph nodes: Secondary | ICD-10-CM

## 2016-02-12 DIAGNOSIS — D72825 Bandemia: Secondary | ICD-10-CM | POA: Insufficient documentation

## 2016-02-12 DIAGNOSIS — D509 Iron deficiency anemia, unspecified: Secondary | ICD-10-CM | POA: Insufficient documentation

## 2016-02-12 DIAGNOSIS — K219 Gastro-esophageal reflux disease without esophagitis: Secondary | ICD-10-CM | POA: Insufficient documentation

## 2016-02-12 DIAGNOSIS — Z5112 Encounter for antineoplastic immunotherapy: Secondary | ICD-10-CM

## 2016-02-12 DIAGNOSIS — R1013 Epigastric pain: Secondary | ICD-10-CM

## 2016-02-12 DIAGNOSIS — Z5111 Encounter for antineoplastic chemotherapy: Secondary | ICD-10-CM

## 2016-02-12 DIAGNOSIS — C50112 Malignant neoplasm of central portion of left female breast: Secondary | ICD-10-CM

## 2016-02-12 LAB — CBC WITH DIFFERENTIAL/PLATELET
Basophils Absolute: 0 10*3/uL (ref 0.0–0.1)
Basophils Relative: 1 %
Eosinophils Absolute: 0.1 10*3/uL (ref 0.0–0.7)
Eosinophils Relative: 1 %
HCT: 34.1 % — ABNORMAL LOW (ref 36.0–46.0)
Hemoglobin: 11.2 g/dL — ABNORMAL LOW (ref 12.0–15.0)
Lymphocytes Relative: 19 %
Lymphs Abs: 1 10*3/uL (ref 0.7–4.0)
MCH: 27.9 pg (ref 26.0–34.0)
MCHC: 32.8 g/dL (ref 30.0–36.0)
MCV: 84.8 fL (ref 78.0–100.0)
Monocytes Absolute: 1.2 10*3/uL — ABNORMAL HIGH (ref 0.1–1.0)
Monocytes Relative: 22 %
Neutro Abs: 3.1 10*3/uL (ref 1.7–7.7)
Neutrophils Relative %: 58 %
Platelets: 433 10*3/uL — ABNORMAL HIGH (ref 150–400)
RBC: 4.02 MIL/uL (ref 3.87–5.11)
RDW: 17.2 % — ABNORMAL HIGH (ref 11.5–15.5)
WBC: 5.4 10*3/uL (ref 4.0–10.5)

## 2016-02-12 LAB — COMPREHENSIVE METABOLIC PANEL
ALT: 115 U/L — ABNORMAL HIGH (ref 14–54)
AST: 73 U/L — ABNORMAL HIGH (ref 15–41)
Albumin: 4.2 g/dL (ref 3.5–5.0)
Alkaline Phosphatase: 80 U/L (ref 38–126)
Anion gap: 5 (ref 5–15)
BUN: 7 mg/dL (ref 6–20)
CO2: 27 mmol/L (ref 22–32)
Calcium: 9 mg/dL (ref 8.9–10.3)
Chloride: 106 mmol/L (ref 101–111)
Creatinine, Ser: 0.42 mg/dL — ABNORMAL LOW (ref 0.44–1.00)
GFR calc Af Amer: >60 mL/min (ref >60)
GFR calc non Af Amer: >60 mL/min (ref >60)
Glucose, Bld: 120 mg/dL — ABNORMAL HIGH (ref 65–99)
Potassium: 3.6 mmol/L (ref 3.5–5.1)
Sodium: 138 mmol/L (ref 135–145)
Total Bilirubin: 0.4 mg/dL (ref 0.3–1.2)
Total Protein: 7.1 g/dL (ref 6.5–8.1)

## 2016-02-12 MED ORDER — OMEPRAZOLE 40 MG PO CPDR: 40.0000 mg | DELAYED_RELEASE_CAPSULE | Freq: Every day | ORAL | Status: DC

## 2016-02-12 MED ORDER — DOXORUBICIN HCL CHEMO IV INJECTION 2 MG/ML
45.0000 mg/m2 | Freq: Once | INTRAVENOUS | Status: AC
  Administered 2016-02-12: 76 mg via INTRAVENOUS
  Filled 2016-02-12: qty 38

## 2016-02-12 MED ORDER — PALONOSETRON HCL INJECTION 0.25 MG/5ML
INTRAVENOUS | Status: AC
  Filled 2016-02-12: qty 5

## 2016-02-12 MED ORDER — SODIUM CHLORIDE 0.9 % IV SOLN
600.0000 mg/m2 | Freq: Once | INTRAVENOUS | Status: AC
  Administered 2016-02-12: 1000 mg via INTRAVENOUS
  Filled 2016-02-12: qty 50

## 2016-02-12 MED ORDER — HEPARIN SOD (PORK) LOCK FLUSH 100 UNIT/ML IV SOLN
500.0000 [IU] | Freq: Once | INTRAVENOUS | Status: AC
  Administered 2016-02-12: 500 [IU] via INTRAVENOUS

## 2016-02-12 MED ORDER — FOSAPREPITANT DIMEGLUMINE INJECTION 150 MG
Freq: Once | INTRAVENOUS | Status: AC
  Administered 2016-02-12: 11:00:00 via INTRAVENOUS
  Filled 2016-02-12: qty 5

## 2016-02-12 MED ORDER — PALONOSETRON HCL INJECTION 0.25 MG/5ML
0.2500 mg | Freq: Once | INTRAVENOUS | Status: AC
  Administered 2016-02-12: 0.25 mg via INTRAVENOUS

## 2016-02-12 MED ORDER — SODIUM CHLORIDE 0.9 % IV SOLN
Freq: Once | INTRAVENOUS | Status: AC
  Administered 2016-02-12: 11:00:00 via INTRAVENOUS

## 2016-02-12 MED ORDER — PEGFILGRASTIM 6 MG/0.6ML ~~LOC~~ PSKT
6.0000 mg | PREFILLED_SYRINGE | Freq: Once | SUBCUTANEOUS | Status: AC
  Administered 2016-02-12: 6 mg via SUBCUTANEOUS
  Filled 2016-02-12: qty 0.6

## 2016-02-12 NOTE — Progress Notes (Signed)
Tolerated AC without any problems but did complain of burning/pain in upper nose while Cytoxan was half way through infusing. Otherwise, ok. Neulsta onpro applied to right arm. VSS. AC dosage reduced on this 4th infusion d/t elevated liver function.

## 2016-02-18 ENCOUNTER — Inpatient Hospital Stay (HOSPITAL_COMMUNITY): Payer: Self-pay

## 2016-02-18 ENCOUNTER — Ambulatory Visit (HOSPITAL_COMMUNITY): Payer: Self-pay | Admitting: Hematology & Oncology

## 2016-02-20 ENCOUNTER — Encounter (HOSPITAL_COMMUNITY): Payer: Self-pay | Admitting: Hematology & Oncology

## 2016-02-24 NOTE — Progress Notes (Signed)
[facility] Hematology/Oncology Progress Note  Name: Michele Salas      MRN: [id number]    Date: 02/24/2016 Time:1:21 PM   REFERRING PHYSICIAN:  Aviva Signs, MD (Gen Surg)  REASON FOR CONSULT:  Left sided breast cancer   DIAGNOSIS:  Invasive ductal carcinoma, high grade, of left breast.  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     Cancer of central portion of left female breast ([place])   09/16/2015 Mammogram 1.6 cm retroareolar left breast mass measuring 1.6 cm in largest diameter in addition to 3 abnormal appearing left inferior axillary lymph nodes.   09/23/2015 Initial Biopsy US guided core biopsy of left breast mass and suspicious left axillary lymph nodes.   09/23/2015 Pathology Results Invasive ductal carcinoma, high grade.  Left axilla lymph node is positive for metastatic carcinoma.  ER/PR NEGATIVE, HER2 NEGATIVE, KI-67 marker of 85%.   10/26/2015 Imaging Bone scan- No evidence of metastatic disease to the skeleton.   10/27/2015 Imaging CT CAP-  Retroareolar mass with 2 asymmetrically prominent left axillary lymph nodes and an upper normal size left subpectoral lymph node. No other findings of metastatic disease in the chest, abdomen, or pelvis.   11/05/2015 Echocardiogram The estimated ejection fraction was in the range of 60% to 65%.   11/23/2015 Miscellaneous Genetic Counseling- genetic test result was negative for any known pathogenic mutations within any of 28 genes that would cause her to be at an increased genetic risk for breast, ovarian, or other related cancers.   12/09/2015 Surgery L radical mastectomy, axillary LN dissection, port a cath insertion with Dr. Aviva Signs 12/09/2015   12/11/2015 Pathology Results - INVASIVE GRADE III DUCTAL CARCINOMA, SPANNING 2.3 CM IN GREATEST DIMENSION. - ASSOCIATED HIGH GRADE DCIS - MARGINS ARE  NEGATIVE. 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 - EIGHT ADDITIONAL LYMPH NODES WITH NO TUMOR SEEN   12/11/2015 Pathology Results HER2 NEGATIVE, ER 0%, PR 0%, Ki-67 marker 90%   12/25/2015 -  Chemotherapy AC every 2 weeks x 4 cycles   02/04/2016 Treatment Plan Change Treatment deferred x 7 days due to neutropenia   Michele Salas returns to the [facility] today accompanied by a translator. The translator serves as a primary source of information.  With the assistance of interpretation, Michele Salas reports that she's good, feeling fine.  She isn't having any problems today. In general, she denies any complaints or comments. She doesn't think she needs anything else today. She is here to begin single agent taxol.   When asked if she's experiencing any burning when she urinates, she denies it. She also denies belly pain, diarrhea, and problems breathing.  She says that her mood is good too, and confrms that she's eating well, and sleeping well.     Review of Systems  Constitutional: Negative for fever, chills, weight loss, malaise/fatigue and diaphoresis.  HENT: Negative for congestion and sore throat.   Eyes: Negative for blurred vision, double vision and redness.  Respiratory: Negative for cough, hemoptysis, sputum production, shortness of breath and wheezing.   Cardiovascular: Negative for chest pain, palpitations and leg swelling.  Gastrointestinal: Positive for nausea. Negative for heartburn, vomiting, abdominal pain, diarrhea, constipation, blood in stool and melena.  Genitourinary: Negative for dysuria, frequency and hematuria.  Musculoskeletal: Negative for myalgias, back pain, joint pain, falls  and neck pain.  Skin: Negative for itching and rash.  Neurological: Negative for dizziness, tingling, tremors, sensory change, speech change, focal weakness and weakness.  Endo/Heme/Allergies: Negative.   Psychiatric/Behavioral: Negative for depression, suicidal  ideas, hallucinations and substance abuse. The patient is not nervous/anxious and does not have insomnia.    HORMONAL RISK FACTORS:  Menarche was at age 6.  First live birth at age 72.  OCP use for approximately not sure how long she took birth control for, but reports that she has not taken in 34 years.  Ovaries intact: yes.  Hysterectomy: no.  Menopausal status: premenopausal, but periods are irregular. HRT use: 0 years. Colonoscopy: n/a; not examined. Mammogram within the last year: this was her first mammogram. Number of breast biopsies: 1. Up to date with pelvic exams: yes. Any excessive radiation exposure in the past: no   PAST MEDICAL HISTORY:   Past Medical History  Diagnosis Date  . GERD (gastroesophageal reflux disease)   . Breast cancer, left ([place]) 10/22/2015  . Iron deficiency anemia 10/27/2015    ALLERGIES: No Known Allergies    MEDICATIONS: I have reviewed the patient's current medications.    Current Outpatient Prescriptions on File Prior to Visit  Medication Sig Dispense Refill  . lidocaine-prilocaine (EMLA) cream Apply a quarter size amount to port site 1 hour prior to chemo. Do not rub in. Cover with plastic wrap. 30 g 3  . omeprazole (PRILOSEC) 40 MG capsule Take 1 capsule (40 mg total) by mouth daily. 30 capsule 3  . ondansetron (ZOFRAN) 8 MG tablet Take 1 tablet (8 mg total) by mouth every 8 (eight) hours as needed for nausea or vomiting. 30 tablet 2  . oxyCODONE-acetaminophen (PERCOCET/ROXICET) 5-325 MG tablet Take 1-2 tablets by mouth every 4 (four) hours as needed for moderate pain. (Patient not taking: Reported on 12/31/2015) 40 tablet 0  . Pegfilgrastim (NEULASTA ONPRO [place]) Inject into the skin. To be given 27 hours after the completion of chemo.    . prochlorperazine (COMPAZINE) 10 MG tablet Take 1 tablet (10 mg total) by mouth every 6 (six) hours as needed for nausea or vomiting. 30 tablet 2  . propranolol (INDERAL) 20 MG tablet 1 po bid. Tome una  tableta por boca dos veces diarias (Patient not taking: Reported on 12/31/2015) 60 tablet 3   No current facility-administered medications on file prior to visit.     PAST SURGICAL HISTORY Past Surgical History  Procedure Laterality Date  . Mastectomy modified radical Left 12/09/2015    Procedure: LEFT MODIFIED RADICAL MASTECTOMY;  Surgeon: Aviva Signs, MD;  Location: [facility];  Service: General;  Laterality: Left;  . Portacath placement Right 12/09/2015    Procedure: INSERTION PORT-A-CATH RIGHT SUBCLAVIAN;  Surgeon: Aviva Signs, MD;  Location: [facility];  Service: General;  Laterality: Right;    FAMILY HISTORY: Family History  Problem Relation Age of Onset  . Diabetes Mother   . Uterine cancer Paternal Aunt 20  . Heart attack Paternal Uncle     unspecified age  . Heart attack Paternal Grandfather 37  . Other Paternal Uncle     stomach swollen with fluid that had to be removed; lim info  Mother is alive at the age of 97. She has hypertension and diabetes. She lives in [place]. Father is deceased at unknown age and for unknown reason. She reports "he was old." Brothers 75 years old and healthy. She has many half siblings, many of whom live in [place]. She has 3 children.  She is a 83 year old daughter who lives in [place], 84 year old son who lives here in [place], and 80 year old daughter who lives in [place]. All children are healthy.  SOCIAL HISTORY: Patient denies any tobacco abuse. Rarely does she have alcoholic beverage. She denies any illicit drug abuse. 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 She is religious and Catholic. She is not a documented citizen of the [place]. She is married.  PERFORMANCE STATUS: The patient's performance status is 0 - Asymptomatic   PHYSICAL EXAM: Vitals - 1 value per visit 09/07/8655  SYSTOLIC 846  DIASTOLIC 59  Pulse 85  Temperature 98  Respirations 16  Weight (lb) 135  Height   BMI 24.69  VISIT REPORT     General appearance: alert, cooperative, appears stated age, no distress and Hispanic, non-english speaking, accompanied by a translator Alopecia Head: Normocephalic, without obvious abnormality, atraumatic Eyes: negative findings: lids and lashes normal, conjunctivae and sclerae normal and corneas clear Throat: lips, mucosa, and tongue normal; teeth and gums normal Neck: no adenopathy, supple, symmetrical, trachea midline and thyroid not enlarged, symmetric, no tenderness/mass/nodules Lungs: clear to auscultation bilaterally and normal percussion bilaterally Breasts: Post mastectomy site is well healed Heart: regular rate and rhythm, S1, S2 normal, no murmur, click, rub or gallop Abdomen: soft, non-tender; bowel sounds normal; no masses,  no organomegaly Extremities: extremities normal, atraumatic, no cyanosis or edema Skin: Skin color, texture, turgor normal. No rashes or lesions Lymph nodes: Axillary adenopathy: left anterior line adenopathy Neurologic: Alert and oriented X 3, normal strength and tone. Normal symmetric reflexes. Normal coordination and gait  LABORATORY DATA:  I have reviewed the data as listed.   Results for JARA, FEIDER (MRN [id number])   Ref. Range 02/25/2016 08:30  Sodium Latest Ref Range: 135-145 mmol/L 138  Potassium Latest Ref Range: 3.5-5.1 mmol/L 3.6  Chloride Latest Ref Range: 101-111 mmol/L 107  CO2 Latest Ref Range: 22-32 mmol/L 24  BUN Latest Ref Range: 6-20 mg/dL 6  Creatinine Latest Ref Range: 0.44-1.00 mg/dL 0.43 (L)  Calcium Latest Ref Range: 8.9-10.3 mg/dL 9.1  EGFR (Non-African Amer.) Latest Ref Range: >60 mL/min >60  EGFR (African American) Latest Ref Range: >60 mL/min >60  Glucose Latest Ref Range: 65-99 mg/dL 126 (H)  Anion gap Latest Ref Range: 5-15  7  Alkaline Phosphatase Latest Ref Range: 38-126 U/L 123  Albumin Latest Ref Range: 3.5-5.0 g/dL 4.2  AST Latest Ref Range: 15-41 U/L 36  ALT Latest Ref Range: 14-54 U/L 56 (H)  Total  Protein Latest Ref Range: 6.5-8.1 g/dL 7.2  Total Bilirubin Latest Ref Range: 0.3-1.2 mg/dL 0.4  Ferritin Latest Ref Range: 11-307 ng/mL 119  WBC Latest Ref Range: 4.0-10.5 K/uL 14.9 (H)  RBC Latest Ref Range: 3.87-5.11 MIL/uL 3.89  Hemoglobin Latest Ref Range: 12.0-15.0 g/dL 11.1 (L)  HCT Latest Ref Range: 36.0-46.0 % 33.2 (L)  MCV Latest Ref Range: 78.0-100.0 fL 85.3  MCH Latest Ref Range: 26.0-34.0 pg 28.5  MCHC Latest Ref Range: 30.0-36.0 g/dL 33.4  RDW Latest Ref Range: 11.5-15.5 % 16.9 (H)  Platelets Latest Ref Range: 150-400 K/uL 137 (L)  Neutrophils Latest Units: % 81  Lymphocytes Latest Units: % 10  Monocytes Relative Latest Units: % 9  Eosinophil Latest Units: % 1  Basophil Latest Units: % 1  NEUT# Latest Ref Range: 1.7-7.7 K/uL 12.0 (H)  Lymphocyte # Latest Ref Range: 0.7-4.0 K/uL 1.4  Monocyte # Latest Ref Range: 0.1-1.0 K/uL 1.3 (H)  Eosinophils Absolute Latest Ref  Range: 0.0-0.7 K/uL 0.1  Basophils Absolute Latest Ref Range: 0.0-0.1 K/uL 0.1    RADIOGRAPHY: I have personally reviewed the radiological images as listed and agreed with the findings in the report. Study Result     CLINICAL DATA: Left breast cancer  EXAM: CHEST 2 VIEW  COMPARISON: CT 10/27/2015  FINDINGS: Calcified granuloma in the right upper lobe, stable. Heart is normal size. No confluent opacities or effusions. No acute bony abnormality.  IMPRESSION: No active cardiopulmonary disease.   Electronically Signed  By: Rolm Baptise M.D.  On: 12/04/2015 11:53      PATHOLOGY:      Pathology from [facility]:  Biopsy of left breast mass at the 12:00 position demonstrates invasive ductal carcinoma, high-grade.  Left axillary lymph node biopsy demonstrates invasive ductal carcinoma, high-grade, consistent with metastasis to lymph node. Breast prognostic panel is performed demonstrating ER/PR negativity, HER-2/neu negativity, and a KI-67 Marker of  85%.    ASSESSMENT/PLAN:  Iron deficiency anemia She did well with IV iron replacement. She was severely iron deficiency and iron stores were replaced in preparation for chemotherapy. Iron levels will be followed.  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 She has completed AC X 4. She denies any questions or concerns. She is overall doing well.  We again reviewed use of her nausea medications.  I will see her back in one week with labs and ongoing chemotherapy.  We discussed neuropathy that can occur with taxol. She notes she understands.   Follow-up as detailed.   All questions were answered. The patient knows to call the clinic with any problems, questions or concerns. We can certainly see the patient much sooner if necessary.  This document serves as a record of services personally performed by Ancil Linsey, MD. It was created on her behalf by Arlyce Harman, a trained medical scribe. The creation of this record is based on the scribe's personal observations and the provider's statements to them. This document has been checked and approved by the attending provider.  I have reviewed the above documentation for accuracy and completeness, and I agree with the above.   This note is electronically signed by: Molli Hazard, MD   02/24/2016 1:21 PM

## 2016-02-25 ENCOUNTER — Ambulatory Visit (HOSPITAL_COMMUNITY): Payer: Self-pay

## 2016-02-25 ENCOUNTER — Encounter (HOSPITAL_BASED_OUTPATIENT_CLINIC_OR_DEPARTMENT_OTHER): Payer: Self-pay

## 2016-02-25 ENCOUNTER — Encounter (HOSPITAL_BASED_OUTPATIENT_CLINIC_OR_DEPARTMENT_OTHER): Payer: Self-pay | Admitting: Hematology & Oncology

## 2016-02-25 ENCOUNTER — Inpatient Hospital Stay (HOSPITAL_COMMUNITY): Payer: Self-pay

## 2016-02-25 ENCOUNTER — Other Ambulatory Visit (HOSPITAL_COMMUNITY): Payer: Self-pay | Admitting: Oncology

## 2016-02-25 VITALS — BP 135/59 | HR 85 | Temp 98.0°F | Resp 16 | Wt 135.0 lb

## 2016-02-25 VITALS — BP 123/69 | HR 96 | Temp 98.1°F | Resp 16

## 2016-02-25 DIAGNOSIS — C50112 Malignant neoplasm of central portion of left female breast: Secondary | ICD-10-CM

## 2016-02-25 DIAGNOSIS — Z5111 Encounter for antineoplastic chemotherapy: Secondary | ICD-10-CM

## 2016-02-25 DIAGNOSIS — D509 Iron deficiency anemia, unspecified: Secondary | ICD-10-CM

## 2016-02-25 DIAGNOSIS — C50919 Malignant neoplasm of unspecified site of unspecified female breast: Secondary | ICD-10-CM

## 2016-02-25 DIAGNOSIS — Z171 Estrogen receptor negative status [ER-]: Secondary | ICD-10-CM

## 2016-02-25 DIAGNOSIS — C50111 Malignant neoplasm of central portion of right female breast: Secondary | ICD-10-CM

## 2016-02-25 LAB — CBC WITH DIFFERENTIAL/PLATELET
Basophils Absolute: 0.1 10*3/uL (ref 0.0–0.1)
Basophils Relative: 1 %
Eosinophils Absolute: 0.1 10*3/uL (ref 0.0–0.7)
Eosinophils Relative: 1 %
HCT: 33.2 % — ABNORMAL LOW (ref 36.0–46.0)
Hemoglobin: 11.1 g/dL — ABNORMAL LOW (ref 12.0–15.0)
Lymphocytes Relative: 10 %
Lymphs Abs: 1.4 10*3/uL (ref 0.7–4.0)
MCH: 28.5 pg (ref 26.0–34.0)
MCHC: 33.4 g/dL (ref 30.0–36.0)
MCV: 85.3 fL (ref 78.0–100.0)
Monocytes Absolute: 1.3 10*3/uL — ABNORMAL HIGH (ref 0.1–1.0)
Monocytes Relative: 9 %
Neutro Abs: 12 10*3/uL — ABNORMAL HIGH (ref 1.7–7.7)
Neutrophils Relative %: 81 %
Platelets: 137 10*3/uL — ABNORMAL LOW (ref 150–400)
RBC: 3.89 MIL/uL (ref 3.87–5.11)
RDW: 16.9 % — ABNORMAL HIGH (ref 11.5–15.5)
WBC: 14.9 10*3/uL — ABNORMAL HIGH (ref 4.0–10.5)

## 2016-02-25 LAB — COMPREHENSIVE METABOLIC PANEL
ALT: 56 U/L — ABNORMAL HIGH (ref 14–54)
AST: 36 U/L (ref 15–41)
Albumin: 4.2 g/dL (ref 3.5–5.0)
Alkaline Phosphatase: 123 U/L (ref 38–126)
Anion gap: 7 (ref 5–15)
BUN: 6 mg/dL (ref 6–20)
CO2: 24 mmol/L (ref 22–32)
Calcium: 9.1 mg/dL (ref 8.9–10.3)
Chloride: 107 mmol/L (ref 101–111)
Creatinine, Ser: 0.43 mg/dL — ABNORMAL LOW (ref 0.44–1.00)
GFR calc Af Amer: >60 mL/min (ref >60)
GFR calc non Af Amer: >60 mL/min (ref >60)
Glucose, Bld: 126 mg/dL — ABNORMAL HIGH (ref 65–99)
Potassium: 3.6 mmol/L (ref 3.5–5.1)
Sodium: 138 mmol/L (ref 135–145)
Total Bilirubin: 0.4 mg/dL (ref 0.3–1.2)
Total Protein: 7.2 g/dL (ref 6.5–8.1)

## 2016-02-25 LAB — FERRITIN: Ferritin: 119 ng/mL (ref 11–307)

## 2016-02-25 MED ORDER — DIPHENHYDRAMINE HCL 50 MG/ML IJ SOLN
INTRAMUSCULAR | Status: AC
  Filled 2016-02-25: qty 1

## 2016-02-25 MED ORDER — FAMOTIDINE IN NACL 20-0.9 MG/50ML-% IV SOLN
20.0000 mg | Freq: Once | INTRAVENOUS | Status: AC
  Administered 2016-02-25: 20 mg via INTRAVENOUS

## 2016-02-25 MED ORDER — HEPARIN SOD (PORK) LOCK FLUSH 100 UNIT/ML IV SOLN
500.0000 [IU] | Freq: Once | INTRAVENOUS | Status: AC | PRN
  Administered 2016-02-25: 500 [IU]

## 2016-02-25 MED ORDER — DEXTROSE 5 % IV SOLN
80.0000 mg/m2 | Freq: Once | INTRAVENOUS | Status: AC
  Administered 2016-02-25: 132 mg via INTRAVENOUS
  Filled 2016-02-25: qty 22

## 2016-02-25 MED ORDER — DIPHENHYDRAMINE HCL 50 MG/ML IJ SOLN
50.0000 mg | Freq: Once | INTRAMUSCULAR | Status: AC
  Administered 2016-02-25: 50 mg via INTRAVENOUS

## 2016-02-25 MED ORDER — SODIUM CHLORIDE 0.9 % IV SOLN
20.0000 mg | Freq: Once | INTRAVENOUS | Status: AC
  Administered 2016-02-25: 20 mg via INTRAVENOUS
  Filled 2016-02-25: qty 2

## 2016-02-25 MED ORDER — SODIUM CHLORIDE 0.9 % IV SOLN
Freq: Once | INTRAVENOUS | Status: AC
  Administered 2016-02-25: 10:00:00 via INTRAVENOUS

## 2016-02-25 MED ORDER — FAMOTIDINE IN NACL 20-0.9 MG/50ML-% IV SOLN
INTRAVENOUS | Status: AC
  Filled 2016-02-25: qty 50

## 2016-02-25 MED ORDER — SODIUM CHLORIDE 0.9% FLUSH
10.0000 mL | INTRAVENOUS | Status: DC | PRN
  Administered 2016-02-25: 10 mL
  Filled 2016-02-25: qty 10

## 2016-02-25 NOTE — Patient Instructions (Signed)
[facility] Discharge Instructions for Patients Receiving Chemotherapy   Beginning January 23rd 2017 lab work for the [facility] will be done in the  Main lab at [facility] on 1st floor. If you have a lab appointment with the [facility] please come in thru the  Main Entrance and check in at the main information desk   Today you received the following chemotherapy agents Taxol.  To help prevent nausea and vomiting after your treatment, we encourage you to take your nausea medication as instructed.   If you develop nausea and vomiting, or diarrhea that is not controlled by your medication, call the clinic.  The clinic phone number is [phone]. Office hours are Monday-Friday 8:30am-5:00pm.  BELOW ARE SYMPTOMS THAT SHOULD BE REPORTED IMMEDIATELY:  *FEVER GREATER THAN 101.0 F  *CHILLS WITH OR WITHOUT FEVER  NAUSEA AND VOMITING THAT IS NOT CONTROLLED WITH YOUR NAUSEA MEDICATION  *UNUSUAL SHORTNESS OF BREATH  *UNUSUAL BRUISING OR BLEEDING  TENDERNESS IN MOUTH AND THROAT WITH OR WITHOUT PRESENCE OF ULCERS  *URINARY PROBLEMS  *BOWEL PROBLEMS  UNUSUAL RASH Items with * indicate a potential emergency and should be followed up as soon as possible. If you have an emergency after office hours please contact your primary care physician or go to the nearest emergency department.  Please call the clinic during office hours if you have any questions or concerns.   You may also contact the Patient Navigator at [phone] should you have any questions or need assistance in obtaining follow up care.      Resources For Cancer Patients and their Caregivers ? American Cancer Society: Can assist with transportation, wigs, general needs, runs Look Good Feel Better.        9478045481 ? Cancer Care: Provides financial assistance, online support groups, medication/co-pay assistance.  1-800-813-HOPE 7311483079) ? [facility] Assists  [place] cancer patients and their families through emotional , educational and financial support.  [phone] ? [place] DSS Where to apply for food stamps, Medicaid and utility assistance. (716)046-6570 ? RCATS: Transportation to medical appointments. 6083020183 ? Social Security Administration: May apply for disability if have a Stage IV cancer. 734-697-6477 [phone] ? [employer], Disability and Transit Services: Assists with nutrition, care and transit needs. [phone]

## 2016-02-25 NOTE — Progress Notes (Signed)
Patient tolerated chemo well. However she does have anticipatory nausea associated with port access/saline flush and heparin flush when port de-accessed.  Ambulatory on discharge home with interpreter.

## 2016-02-25 NOTE — Patient Instructions (Addendum)
[place] at [facility] Discharge Instructions  RECOMMENDATIONS MADE BY THE CONSULTANT AND ANY TEST RESULTS WILL BE SENT TO YOUR REFERRING PHYSICIAN.  Exam done and seen today by Dr. Whitney Muse Interpreter present for visit. Return to see the doctor, chemo and labs in one week. Please call the clinic if you have any questions or concerns  Thank you for choosing [place] at [facility] to provide your oncology and hematology care.  To afford each patient quality time with our provider, please arrive at least 15 minutes before your scheduled appointment time.   Beginning January 23rd 2017 lab work for the [facility] will be done in the  Main lab at [facility] on 1st floor. If you have a lab appointment with the [facility] please come in thru the  Main Entrance and check in at the main information desk  You need to re-schedule your appointment should you arrive 10 or more minutes late.  We strive to give you quality time with our providers, and arriving late affects you and other patients whose appointments are after yours.  Also, if you no show three or more times for appointments you may be dismissed from the clinic at the providers discretion.     Again, thank you for choosing [facility].  Our hope is that these requests will decrease the amount of time that you wait before being seen by our physicians.       _____________________________________________________________  Should you have questions after your visit to [facility], please contact our office at [phone] between the hours of 8:30 a.m. and 4:30 p.m.  Voicemails left after 4:30 p.m. will not be returned until the following business day.  For prescription refill requests, have your pharmacy contact our office.         Resources For Cancer Patients and their Caregivers ? American Cancer Society: Can assist with transportation, wigs, general  needs, runs Look Good Feel Better.        240-797-6479 ? Cancer Care: Provides financial assistance, online support groups, medication/co-pay assistance.  1-800-813-HOPE (843)008-2782) ? [facility] Assists [place] cancer patients and their families through emotional , educational and financial support.  4083914349 ? [place] DSS Where to apply for food stamps, Medicaid and utility assistance. 607-168-3970 ? RCATS: Transportation to medical appointments. 406 843 1283 ? Social Security Administration: May apply for disability if have a Stage IV cancer. [phone]) 756-5592 [phone] ? [employer], Disability and Transit Services: Assists with nutrition, care and transit needs. [place] Support Programs: @10RELATIVEDAYS @ > Cancer Support Group  2nd Tuesday of the month 1pm-2pm, Journey Room  > Creative Journey  3rd Tuesday of the month 1130am-1pm, Journey Room  > Look Good Feel Better  1st Wednesday of the month 10am-12 noon, Journey Room (Call [facility] to register [phone])

## 2016-02-26 ENCOUNTER — Encounter (HOSPITAL_COMMUNITY): Payer: Self-pay | Admitting: Hematology & Oncology

## 2016-02-26 ENCOUNTER — Telehealth (HOSPITAL_COMMUNITY): Payer: Self-pay | Admitting: *Deleted

## 2016-02-26 NOTE — Telephone Encounter (Signed)
Spoke with patient's niece Magda Paganini. She will call patient and ask how she tolerated chemo yesterday and return call to clinic with any issues or concerns.

## 2016-03-03 ENCOUNTER — Encounter (HOSPITAL_COMMUNITY): Payer: Self-pay | Admitting: Oncology

## 2016-03-03 ENCOUNTER — Encounter (HOSPITAL_BASED_OUTPATIENT_CLINIC_OR_DEPARTMENT_OTHER): Payer: Self-pay | Admitting: Oncology

## 2016-03-03 ENCOUNTER — Inpatient Hospital Stay (HOSPITAL_COMMUNITY): Payer: Self-pay

## 2016-03-03 ENCOUNTER — Encounter (HOSPITAL_BASED_OUTPATIENT_CLINIC_OR_DEPARTMENT_OTHER): Payer: Self-pay

## 2016-03-03 VITALS — BP 115/76 | HR 90 | Temp 98.3°F | Resp 18 | Wt 134.6 lb

## 2016-03-03 DIAGNOSIS — C773 Secondary and unspecified malignant neoplasm of axilla and upper limb lymph nodes: Secondary | ICD-10-CM

## 2016-03-03 DIAGNOSIS — Z5111 Encounter for antineoplastic chemotherapy: Secondary | ICD-10-CM

## 2016-03-03 DIAGNOSIS — C50112 Malignant neoplasm of central portion of left female breast: Secondary | ICD-10-CM

## 2016-03-03 LAB — CBC WITH DIFFERENTIAL/PLATELET
Basophils Absolute: 0 10*3/uL (ref 0.0–0.1)
Basophils Relative: 1 %
Eosinophils Absolute: 0.1 10*3/uL (ref 0.0–0.7)
Eosinophils Relative: 1 %
HCT: 29.8 % — ABNORMAL LOW (ref 36.0–46.0)
Hemoglobin: 10.3 g/dL — ABNORMAL LOW (ref 12.0–15.0)
Lymphocytes Relative: 22 %
Lymphs Abs: 0.9 10*3/uL (ref 0.7–4.0)
MCH: 29.3 pg (ref 26.0–34.0)
MCHC: 34.6 g/dL (ref 30.0–36.0)
MCV: 84.9 fL (ref 78.0–100.0)
Monocytes Absolute: 0.5 10*3/uL (ref 0.1–1.0)
Monocytes Relative: 13 %
Neutro Abs: 2.6 10*3/uL (ref 1.7–7.7)
Neutrophils Relative %: 63 %
Platelets: 400 10*3/uL (ref 150–400)
RBC: 3.51 MIL/uL — ABNORMAL LOW (ref 3.87–5.11)
RDW: 16.7 % — ABNORMAL HIGH (ref 11.5–15.5)
WBC: 4.1 10*3/uL (ref 4.0–10.5)

## 2016-03-03 LAB — COMPREHENSIVE METABOLIC PANEL
ALT: 91 U/L — ABNORMAL HIGH (ref 14–54)
AST: 53 U/L — ABNORMAL HIGH (ref 15–41)
Albumin: 4.1 g/dL (ref 3.5–5.0)
Alkaline Phosphatase: 80 U/L (ref 38–126)
Anion gap: 6 (ref 5–15)
BUN: 8 mg/dL (ref 6–20)
CO2: 26 mmol/L (ref 22–32)
Calcium: 9.3 mg/dL (ref 8.9–10.3)
Chloride: 107 mmol/L (ref 101–111)
Creatinine, Ser: 0.41 mg/dL — ABNORMAL LOW (ref 0.44–1.00)
GFR calc Af Amer: >60 mL/min (ref >60)
GFR calc non Af Amer: >60 mL/min (ref >60)
Glucose, Bld: 106 mg/dL — ABNORMAL HIGH (ref 65–99)
Potassium: 3.6 mmol/L (ref 3.5–5.1)
Sodium: 139 mmol/L (ref 135–145)
Total Bilirubin: 0.5 mg/dL (ref 0.3–1.2)
Total Protein: 7.1 g/dL (ref 6.5–8.1)

## 2016-03-03 MED ORDER — DIPHENHYDRAMINE HCL 50 MG/ML IJ SOLN
50.0000 mg | Freq: Once | INTRAMUSCULAR | Status: AC
  Administered 2016-03-03: 50 mg via INTRAVENOUS
  Filled 2016-03-03: qty 1

## 2016-03-03 MED ORDER — SODIUM CHLORIDE 0.9 % IV SOLN
Freq: Once | INTRAVENOUS | Status: AC
  Administered 2016-03-03: 12:00:00 via INTRAVENOUS

## 2016-03-03 MED ORDER — SODIUM CHLORIDE 0.9% FLUSH: 10.0000 mL | INTRAVENOUS | Status: DC | PRN

## 2016-03-03 MED ORDER — FAMOTIDINE IN NACL 20-0.9 MG/50ML-% IV SOLN
20.0000 mg | Freq: Once | INTRAVENOUS | Status: AC
  Administered 2016-03-03: 20 mg via INTRAVENOUS
  Filled 2016-03-03: qty 50

## 2016-03-03 MED ORDER — SODIUM CHLORIDE 0.9 % IV SOLN
20.0000 mg | Freq: Once | INTRAVENOUS | Status: AC
  Administered 2016-03-03: 20 mg via INTRAVENOUS
  Filled 2016-03-03: qty 2

## 2016-03-03 MED ORDER — DEXTROSE 5 % IV SOLN
80.0000 mg/m2 | Freq: Once | INTRAVENOUS | Status: AC
  Administered 2016-03-03: 132 mg via INTRAVENOUS
  Filled 2016-03-03: qty 22

## 2016-03-03 MED ORDER — HEPARIN SOD (PORK) LOCK FLUSH 100 UNIT/ML IV SOLN
500.0000 [IU] | Freq: Once | INTRAVENOUS | Status: AC | PRN
  Administered 2016-03-03: 500 [IU]
  Filled 2016-03-03: qty 5

## 2016-03-03 NOTE — Patient Instructions (Signed)
[facility] Discharge Instructions for Patients Receiving Chemotherapy   Beginning January 23rd 2017 lab work for the [facility] will be done in the  Main lab at [facility] on 1st floor. If you have a lab appointment with the [facility] please come in thru the  Main Entrance and check in at the main information desk   Today you received the following chemotherapy agents:  Taxol  If you develop nausea and vomiting, or diarrhea that is not controlled by your medication, call the clinic.  The clinic phone number is [phone]. Office hours are Monday-Friday 8:30am-5:00pm.  BELOW ARE SYMPTOMS THAT SHOULD BE REPORTED IMMEDIATELY:  *FEVER GREATER THAN 101.0 F  *CHILLS WITH OR WITHOUT FEVER  NAUSEA AND VOMITING THAT IS NOT CONTROLLED WITH YOUR NAUSEA MEDICATION  *UNUSUAL SHORTNESS OF BREATH  *UNUSUAL BRUISING OR BLEEDING  TENDERNESS IN MOUTH AND THROAT WITH OR WITHOUT PRESENCE OF ULCERS  *URINARY PROBLEMS  *BOWEL PROBLEMS  UNUSUAL RASH Items with * indicate a potential emergency and should be followed up as soon as possible. If you have an emergency after office hours please contact your primary care physician or go to the nearest emergency department.  Please call the clinic during office hours if you have any questions or concerns.   You may also contact the Patient Navigator at [phone] should you have any questions or need assistance in obtaining follow up care.      Resources For Cancer Patients and their Caregivers ? American Cancer Society: Can assist with transportation, wigs, general needs, runs Look Good Feel Better.        980-794-2882 ? Cancer Care: Provides financial assistance, online support groups, medication/co-pay assistance.  1-800-813-HOPE 5177178157) ? [place] Assists [place] cancer patients and their families through emotional , educational and financial support.  737-515-0609 ? [place] DSS Where to apply for food stamps, Medicaid and utility assistance. (765)638-0598 ? RCATS: Transportation to medical appointments. 5063381982 ? Social Security Administration: May apply for disability if have a Stage IV cancer. [phone] ? [employer], Disability and Transit Services: Assists with nutrition, care and transit needs. [phone]

## 2016-03-03 NOTE — Progress Notes (Signed)
Dr. Whitney Muse aware of ALT 91.  Okay to tx today per MD.  1330:  Tolerated tx w/o adverse reaction.  A&Ox4, in no distress.  VSS. Discharged ambulatory in c/o family.

## 2016-03-03 NOTE — Patient Instructions (Addendum)
[place] at [facility] Discharge Instructions  RECOMMENDATIONS MADE BY THE CONSULTANT AND ANY TEST RESULTS WILL BE SENT TO YOUR REFERRING PHYSICIAN.  You were seen by Kirby Crigler, PA-C today. Continue weekly pre-treatment labs and chemo. Return in 2 weeks for Follow up.    Thank you for choosing [place] at [facility] to provide your oncology and hematology care.  To afford each patient quality time with our provider, please arrive at least 15 minutes before your scheduled appointment time.   Beginning January 23rd 2017 lab work for the [facility] will be done in the  Main lab at [facility] on 1st floor. If you have a lab appointment with the [facility] please come in thru the  Main Entrance and check in at the main information desk  You need to re-schedule your appointment should you arrive 10 or more minutes late.  We strive to give you quality time with our providers, and arriving late affects you and other patients whose appointments are after yours.  Also, if you no show three or more times for appointments you may be dismissed from the clinic at the providers discretion.     Again, thank you for choosing [facility].  Our hope is that these requests will decrease the amount of time that you wait before being seen by our physicians.       _____________________________________________________________  Should you have questions after your visit to [facility], please contact our office at [phone] between the hours of 8:30 a.m. and 4:30 p.m.  Voicemails left after 4:30 p.m. will not be returned until the following business day.  For prescription refill requests, have your pharmacy contact our office.         Resources For Cancer Patients and their Caregivers ? American Cancer Society: Can assist with transportation, wigs, general needs, runs Look Good Feel Better.         808-071-8664 ? Cancer Care: Provides financial assistance, online support groups, medication/co-pay assistance.  1-800-813-HOPE 310-091-1593) ? [place] Assists [place] cancer patients and their families through emotional , educational and financial support.  803-273-8606 ? [place] DSS Where to apply for food stamps, Medicaid and utility assistance. 719-374-7645 ? RCATS: Transportation to medical appointments. 219-172-9502 ? Social Security Administration: May apply for disability if have a Stage IV cancer. 667-164-5341 [phone] [phone] ? [employer], Disability and Transit Services: Assists with nutrition, care and transit needs. [place] Support Programs: @10RELATIVEDAYS @ > Cancer Support Group  2nd Tuesday of the month 1pm-2pm, Journey Room  > Creative Journey  3rd Tuesday of the month 1130am-1pm, Journey Room  > Look Good Feel Better  1st Wednesday of the month 10am-12 noon, Journey Room (Call [place] to register [phone])

## 2016-03-04 NOTE — Assessment & Plan Note (Signed)
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 Arnoldo Morale on 12/09/2015.  Now on systemic adjuvant chemotherapy.  Oncology history is up to date.  Labs today: CBC diff, CMET.  I personally reviewed and went over laboratory results with the patient.  The results are noted within this dictation.  Labs satisfy treatment parameters today.  Mild ALT/AST elevation is noted.  Continue weekly treatment as planned and return in 2 weeks for follow-up.

## 2016-03-04 NOTE — Progress Notes (Signed)
Michele Dryer, PA-C [place] [address]  Cancer of central portion of left female breast [facility])  CURRENT THERAPY: Paclitaxel weekly.  INTERVAL HISTORY: Michele Salas 35 y.o. 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 Arnoldo Morale on 12/09/2015.  Now on systemic adjuvant chemotherapy.    Cancer of central portion of left female breast ([place])   09/16/2015 Mammogram    1.6 cm retroareolar left breast mass measuring 1.6 cm in largest diameter in addition to 3 abnormal appearing left inferior axillary lymph nodes.     09/23/2015 Initial Biopsy    US guided core biopsy of left breast mass and suspicious left axillary lymph nodes.     09/23/2015 Pathology Results    Invasive ductal carcinoma, high grade.  Left axilla lymph node is positive for metastatic carcinoma.  ER/PR NEGATIVE, HER2 NEGATIVE, KI-67 marker of 85%.     10/26/2015 Imaging    Bone scan- No evidence of metastatic disease to the skeleton.     10/27/2015 Imaging    CT CAP-  Retroareolar mass with 2 asymmetrically prominent left axillary lymph nodes and an upper normal size left subpectoral lymph node. No other findings of metastatic disease in the chest, abdomen, or pelvis.     11/05/2015 Echocardiogram    The estimated ejection fraction was in the range of 60% to 65%.     11/23/2015 Miscellaneous    Genetic Counseling- genetic test result was negative for any known pathogenic mutations within any of 28 genes that would cause her to be at an increased genetic risk for breast, ovarian, or other related cancers.     12/09/2015 Surgery    L radical mastectomy, axillary LN dissection, port a cath insertion with Dr. Aviva Signs 12/09/2015     12/11/2015 Pathology Results    - INVASIVE GRADE III DUCTAL CARCINOMA, SPANNING 2.3 CM IN GREATEST DIMENSION. - ASSOCIATED HIGH  GRADE DCIS - MARGINS ARE NEGATIVE. 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 - EIGHT ADDITIONAL LYMPH NODES WITH NO TUMOR SEEN     12/11/2015 Pathology Results    HER2 NEGATIVE, ER 0%, PR 0%, Ki-67 marker 90%     12/25/2015 - 02/12/2016 Chemotherapy    AC every 2 weeks x 4 cycles     02/04/2016 Treatment Plan Change    Treatment deferred x 7 days due to neutropenia     02/25/2016 -  Chemotherapy    Paclitaxel weekly x 12      She denies any complaints today.   Review of Systems  Constitutional: Negative.  Negative for chills, fever and weight loss.  HENT: Negative.   Eyes: Negative.  Negative for blurred vision and double vision.  Respiratory: Negative.  Negative for cough and shortness of breath.   Cardiovascular: Negative.  Negative for chest pain and leg swelling.  Gastrointestinal: Negative.  Negative for abdominal pain, constipation, diarrhea, nausea and vomiting.  Genitourinary: Negative.   Musculoskeletal: Negative.   Skin: Negative.  Negative for itching and rash.  Neurological: Negative.  Negative for weakness and headaches.  Endo/Heme/Allergies: Negative.   Psychiatric/Behavioral: Negative.     Past Medical History:  Diagnosis Date  . Breast cancer, left ([place]) 10/22/2015  . GERD (gastroesophageal reflux disease)   . Iron deficiency anemia 10/27/2015    Past Surgical History:  Procedure Laterality  Date  . MASTECTOMY MODIFIED RADICAL Left 12/09/2015   Procedure: LEFT MODIFIED RADICAL MASTECTOMY;  Surgeon: Aviva Signs, MD;  Location: [facility];  Service: General;  Laterality: Left;  . PORTACATH PLACEMENT Right 12/09/2015   Procedure: INSERTION PORT-A-CATH RIGHT SUBCLAVIAN;  Surgeon: Aviva Signs, MD;  Location: [facility];  Service: General;  Laterality: Right;    Family History  Problem Relation Age of Onset  . Diabetes Mother   . Uterine cancer Paternal Aunt 29  . Heart attack Paternal Uncle     unspecified age  . Heart attack Paternal Grandfather  24  . Other Paternal Uncle     stomach swollen with fluid that had to be removed; lim info    Social History   Social History  . Marital status: Married    Spouse name: N/A  . Number of children: N/A  . Years of education: N/A   Social History Main Topics  . Smoking status: Passive Smoke Exposure - Never Smoker  . Smokeless tobacco: Never Used  . Alcohol use No  . Drug use: No  . Sexual activity: Yes    Birth control/ protection: None   Other Topics Concern  . None   Social History Narrative  . None     PHYSICAL EXAMINATION  ECOG PERFORMANCE STATUS: 0 - Asymptomatic  There were no vitals filed for this visit.  Blood pressure 115/76 Pulse 90 Respirations 18 Temperature 98.3 O2 saturation 100% on room air.  GENERAL:alert, no distress, well nourished, well developed, comfortable, cooperative, smiling and unaccompanied, no interpreter on site. SKIN: skin color, texture, turgor are normal, no rashes or significant lesions HEAD: Normocephalic, No masses, lesions, tenderness or abnormalities EYES: normal, EOMI, Conjunctiva are pink and non-injected EARS: External ears normal OROPHARYNX:lips, buccal mucosa, and tongue normal and mucous membranes are moist  NECK: supple, trachea midline LYMPH:  not examined BREAST:not examined LUNGS: clear to auscultation  HEART: regular rate & rhythm, no murmurs, no gallops, S1 normal and S2 normal ABDOMEN:abdomen soft, non-tender and normal bowel sounds BACK: Back symmetric, no curvature. EXTREMITIES:less then 2 second capillary refill, no joint deformities, effusion, or inflammation, no skin discoloration, no cyanosis  NEURO: alert & oriented x 3 with fluent speech, no focal motor/sensory deficits, gait normal   LABORATORY DATA: CBC    Component Value Date/Time   WBC 4.1 03/03/2016 1049   RBC 3.51 (L) 03/03/2016 1049   HGB 10.3 (L) 03/03/2016 1049   HCT 29.8 (L) 03/03/2016 1049   PLT 400 03/03/2016 1049   MCV 84.9 03/03/2016  1049   MCH 29.3 03/03/2016 1049   MCHC 34.6 03/03/2016 1049   RDW 16.7 (H) 03/03/2016 1049   LYMPHSABS 0.9 03/03/2016 1049   MONOABS 0.5 03/03/2016 1049   EOSABS 0.1 03/03/2016 1049   BASOSABS 0.0 03/03/2016 1049      Chemistry      Component Value Date/Time   NA 139 03/03/2016 1049   K 3.6 03/03/2016 1049   CL 107 03/03/2016 1049   CO2 26 03/03/2016 1049   BUN 8 03/03/2016 1049   CREATININE 0.41 (L) 03/03/2016 1049      Component Value Date/Time   CALCIUM 9.3 03/03/2016 1049   ALKPHOS 80 03/03/2016 1049   AST 53 (H) 03/03/2016 1049   ALT 91 (H) 03/03/2016 1049   BILITOT 0.5 03/03/2016 1049        PENDING LABS:   RADIOGRAPHIC STUDIES:  No results found.   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 Arnoldo Morale on 12/09/2015.  Now on systemic adjuvant chemotherapy.  Oncology history is up to date.  Labs today: CBC diff, CMET.  I personally reviewed and went over laboratory results with the patient.  The results are noted within this dictation.  Labs satisfy treatment parameters today.  Mild ALT/AST elevation is noted.  Continue weekly treatment as planned and return in 2 weeks for follow-up.   ORDERS PLACED FOR THIS ENCOUNTER: No orders of the defined types were placed in this encounter.   MEDICATIONS PRESCRIBED THIS ENCOUNTER: No orders of the defined types were placed in this encounter.   THERAPY PLAN:  Continue with therapy as planned.  All questions were answered. The patient knows to call the clinic with any problems, questions or concerns. We can certainly see the patient much sooner if necessary.  Patient and plan discussed with Dr. Ancil Linsey and she is in agreement with the aforementioned.   This note is electronically signed by:  Doy Mince 03/04/2016 10:07 PM

## 2016-03-10 ENCOUNTER — Inpatient Hospital Stay (HOSPITAL_COMMUNITY): Payer: Self-pay

## 2016-03-10 ENCOUNTER — Encounter (HOSPITAL_COMMUNITY): Payer: Self-pay | Attending: Hematology & Oncology

## 2016-03-10 VITALS — BP 123/69 | HR 91 | Temp 99.3°F | Resp 16 | Wt 135.8 lb

## 2016-03-10 DIAGNOSIS — C773 Secondary and unspecified malignant neoplasm of axilla and upper limb lymph nodes: Secondary | ICD-10-CM

## 2016-03-10 DIAGNOSIS — Z79899 Other long term (current) drug therapy: Secondary | ICD-10-CM | POA: Insufficient documentation

## 2016-03-10 DIAGNOSIS — C50112 Malignant neoplasm of central portion of left female breast: Secondary | ICD-10-CM

## 2016-03-10 DIAGNOSIS — Z5112 Encounter for antineoplastic immunotherapy: Secondary | ICD-10-CM

## 2016-03-10 DIAGNOSIS — R51 Headache: Secondary | ICD-10-CM | POA: Insufficient documentation

## 2016-03-10 DIAGNOSIS — R1013 Epigastric pain: Secondary | ICD-10-CM | POA: Insufficient documentation

## 2016-03-10 DIAGNOSIS — N644 Mastodynia: Secondary | ICD-10-CM | POA: Insufficient documentation

## 2016-03-10 DIAGNOSIS — K219 Gastro-esophageal reflux disease without esophagitis: Secondary | ICD-10-CM | POA: Insufficient documentation

## 2016-03-10 DIAGNOSIS — C50912 Malignant neoplasm of unspecified site of left female breast: Secondary | ICD-10-CM | POA: Insufficient documentation

## 2016-03-10 LAB — CBC WITH DIFFERENTIAL/PLATELET
Basophils Absolute: 0 10*3/uL (ref 0.0–0.1)
Basophils Relative: 1 %
Eosinophils Absolute: 0 10*3/uL (ref 0.0–0.7)
Eosinophils Relative: 1 %
HCT: 31.6 % — ABNORMAL LOW (ref 36.0–46.0)
Hemoglobin: 10.9 g/dL — ABNORMAL LOW (ref 12.0–15.0)
Lymphocytes Relative: 23 %
Lymphs Abs: 1 10*3/uL (ref 0.7–4.0)
MCH: 29.9 pg (ref 26.0–34.0)
MCHC: 34.5 g/dL (ref 30.0–36.0)
MCV: 86.6 fL (ref 78.0–100.0)
Monocytes Absolute: 0.5 10*3/uL (ref 0.1–1.0)
Monocytes Relative: 11 %
Neutro Abs: 3 10*3/uL (ref 1.7–7.7)
Neutrophils Relative %: 64 %
Platelets: 469 10*3/uL — ABNORMAL HIGH (ref 150–400)
RBC: 3.65 MIL/uL — ABNORMAL LOW (ref 3.87–5.11)
RDW: 17.7 % — ABNORMAL HIGH (ref 11.5–15.5)
WBC: 4.6 10*3/uL (ref 4.0–10.5)

## 2016-03-10 LAB — COMPREHENSIVE METABOLIC PANEL
ALT: 125 U/L — ABNORMAL HIGH (ref 14–54)
AST: 58 U/L — ABNORMAL HIGH (ref 15–41)
Albumin: 4.2 g/dL (ref 3.5–5.0)
Alkaline Phosphatase: 82 U/L (ref 38–126)
Anion gap: 6 (ref 5–15)
BUN: 8 mg/dL (ref 6–20)
CO2: 26 mmol/L (ref 22–32)
Calcium: 9.2 mg/dL (ref 8.9–10.3)
Chloride: 107 mmol/L (ref 101–111)
Creatinine, Ser: 0.42 mg/dL — ABNORMAL LOW (ref 0.44–1.00)
GFR calc Af Amer: >60 mL/min (ref >60)
GFR calc non Af Amer: >60 mL/min (ref >60)
Glucose, Bld: 109 mg/dL — ABNORMAL HIGH (ref 65–99)
Potassium: 3.7 mmol/L (ref 3.5–5.1)
Sodium: 139 mmol/L (ref 135–145)
Total Bilirubin: 0.5 mg/dL (ref 0.3–1.2)
Total Protein: 7.1 g/dL (ref 6.5–8.1)

## 2016-03-10 MED ORDER — SODIUM CHLORIDE 0.9 % IV SOLN
Freq: Once | INTRAVENOUS | Status: AC
  Administered 2016-03-10: 11:00:00 via INTRAVENOUS

## 2016-03-10 MED ORDER — SODIUM CHLORIDE 0.9 % IV SOLN
20.0000 mg | Freq: Once | INTRAVENOUS | Status: AC
  Administered 2016-03-10: 20 mg via INTRAVENOUS
  Filled 2016-03-10: qty 2

## 2016-03-10 MED ORDER — FAMOTIDINE IN NACL 20-0.9 MG/50ML-% IV SOLN
20.0000 mg | Freq: Once | INTRAVENOUS | Status: AC
  Administered 2016-03-10: 20 mg via INTRAVENOUS
  Filled 2016-03-10: qty 50

## 2016-03-10 MED ORDER — HEPARIN SOD (PORK) LOCK FLUSH 100 UNIT/ML IV SOLN
500.0000 [IU] | Freq: Once | INTRAVENOUS | Status: AC | PRN
  Administered 2016-03-10: 500 [IU]
  Filled 2016-03-10: qty 5

## 2016-03-10 MED ORDER — DIPHENHYDRAMINE HCL 50 MG/ML IJ SOLN
50.0000 mg | Freq: Once | INTRAMUSCULAR | Status: AC
Start: 2016-03-10 — End: 2016-03-10
  Administered 2016-03-10: 50 mg via INTRAVENOUS
  Filled 2016-03-10: qty 1

## 2016-03-10 MED ORDER — DEXTROSE 5 % IV SOLN
80.0000 mg/m2 | Freq: Once | INTRAVENOUS | Status: AC
  Administered 2016-03-10: 132 mg via INTRAVENOUS
  Filled 2016-03-10: qty 22

## 2016-03-10 MED ORDER — DIPHENHYDRAMINE HCL 25 MG PO CAPS
ORAL_CAPSULE | ORAL | Status: AC
  Filled 2016-03-10: qty 1

## 2016-03-10 MED ORDER — HEPARIN SOD (PORK) LOCK FLUSH 100 UNIT/ML IV SOLN
INTRAVENOUS | Status: AC
  Filled 2016-03-10: qty 5

## 2016-03-10 MED ORDER — SODIUM CHLORIDE 0.9% FLUSH: 10.0000 mL | INTRAVENOUS | Status: DC | PRN

## 2016-03-10 MED ORDER — ACETAMINOPHEN 325 MG PO TABS
ORAL_TABLET | ORAL | Status: AC
  Filled 2016-03-10: qty 2

## 2016-03-10 NOTE — Progress Notes (Signed)
Yuette Sprinkle tolerated chemo tx well without complaints. Pt discharged self ambulatory in satisfactory condition with family

## 2016-03-10 NOTE — Patient Instructions (Signed)
[facility] Discharge Instructions for Patients Receiving Chemotherapy   Beginning January 23rd 2017 lab work for the [facility] will be done in the  Main lab at [facility] on 1st floor. If you have a lab appointment with the [facility] please come in thru the  Main Entrance and check in at the main information desk   Today you received the following chemotherapy agents Taxol. Follow-up as scheduled. Call clinic for any questions and concerns  To help prevent nausea and vomiting after your treatment, we encourage you to take your nausea medication..   If you develop nausea and vomiting, or diarrhea that is not controlled by your medication, call the clinic.  The clinic phone number is [phone]. Office hours are Monday-Friday 8:30am-5:00pm.  BELOW ARE SYMPTOMS THAT SHOULD BE REPORTED IMMEDIATELY:  *FEVER GREATER THAN 101.0 F  *CHILLS WITH OR WITHOUT FEVER  NAUSEA AND VOMITING THAT IS NOT CONTROLLED WITH YOUR NAUSEA MEDICATION  *UNUSUAL SHORTNESS OF BREATH  *UNUSUAL BRUISING OR BLEEDING  TENDERNESS IN MOUTH AND THROAT WITH OR WITHOUT PRESENCE OF ULCERS  *URINARY PROBLEMS  *BOWEL PROBLEMS  UNUSUAL RASH Items with * indicate a potential emergency and should be followed up as soon as possible. If you have an emergency after office hours please contact your primary care physician or go to the nearest emergency department.  Please call the clinic during office hours if you have any questions or concerns.   You may also contact the Patient Navigator at [phone] should you have any questions or need assistance in obtaining follow up care.      Resources For Cancer Patients and their Caregivers ? American Cancer Society: Can assist with transportation, wigs, general needs, runs Look Good Feel Better.        [phone] ? Cancer Care: Provides financial assistance, online support groups, medication/co-pay assistance.  1-800-813-HOPE  [phone]) ? [place] Assists [place] cancer patients and their families through emotional , educational and financial support.  563-647-0589 ? [place] DSS Where to apply for food stamps, Medicaid and utility assistance. [phone] ? [employer]: Transportation to medical appointments. 820-038-3775 ? Social Security Administration: May apply for disability if have a Stage IV cancer. [phone] ? [employer], Disability and Transit Services: Assists with nutrition, care and transit needs. [phone]

## 2016-03-10 NOTE — Progress Notes (Signed)
Tolerated tx w/o adverse reaction.  Alert, in no distress.  VSS.  Discharged ambulatory.

## 2016-03-17 ENCOUNTER — Encounter (HOSPITAL_COMMUNITY): Payer: Self-pay | Admitting: Oncology

## 2016-03-17 ENCOUNTER — Encounter (HOSPITAL_BASED_OUTPATIENT_CLINIC_OR_DEPARTMENT_OTHER): Payer: Self-pay | Admitting: Oncology

## 2016-03-17 ENCOUNTER — Inpatient Hospital Stay (HOSPITAL_COMMUNITY): Payer: Self-pay

## 2016-03-17 ENCOUNTER — Encounter (HOSPITAL_BASED_OUTPATIENT_CLINIC_OR_DEPARTMENT_OTHER): Payer: Self-pay

## 2016-03-17 VITALS — BP 118/66 | HR 92 | Temp 99.0°F | Resp 16 | Wt 136.2 lb

## 2016-03-17 DIAGNOSIS — Z171 Estrogen receptor negative status [ER-]: Secondary | ICD-10-CM

## 2016-03-17 DIAGNOSIS — Z5111 Encounter for antineoplastic chemotherapy: Secondary | ICD-10-CM

## 2016-03-17 DIAGNOSIS — C50112 Malignant neoplasm of central portion of left female breast: Secondary | ICD-10-CM

## 2016-03-17 DIAGNOSIS — C773 Secondary and unspecified malignant neoplasm of axilla and upper limb lymph nodes: Secondary | ICD-10-CM

## 2016-03-17 LAB — COMPREHENSIVE METABOLIC PANEL
ALT: 112 U/L — ABNORMAL HIGH (ref 14–54)
AST: 53 U/L — ABNORMAL HIGH (ref 15–41)
Albumin: 4 g/dL (ref 3.5–5.0)
Alkaline Phosphatase: 81 U/L (ref 38–126)
Anion gap: 6 (ref 5–15)
BUN: 5 mg/dL — ABNORMAL LOW (ref 6–20)
CO2: 26 mmol/L (ref 22–32)
Calcium: 8.9 mg/dL (ref 8.9–10.3)
Chloride: 105 mmol/L (ref 101–111)
Creatinine, Ser: 0.39 mg/dL — ABNORMAL LOW (ref 0.44–1.00)
GFR calc Af Amer: >60 mL/min (ref >60)
GFR calc non Af Amer: >60 mL/min (ref >60)
Glucose, Bld: 126 mg/dL — ABNORMAL HIGH (ref 65–99)
Potassium: 3.5 mmol/L (ref 3.5–5.1)
Sodium: 137 mmol/L (ref 135–145)
Total Bilirubin: 0.6 mg/dL (ref 0.3–1.2)
Total Protein: 6.8 g/dL (ref 6.5–8.1)

## 2016-03-17 LAB — CBC WITH DIFFERENTIAL/PLATELET
Basophils Absolute: 0 10*3/uL (ref 0.0–0.1)
Basophils Relative: 0 %
Eosinophils Absolute: 0.1 10*3/uL (ref 0.0–0.7)
Eosinophils Relative: 1 %
HCT: 31.7 % — ABNORMAL LOW (ref 36.0–46.0)
Hemoglobin: 10.7 g/dL — ABNORMAL LOW (ref 12.0–15.0)
Lymphocytes Relative: 17 %
Lymphs Abs: 0.8 10*3/uL (ref 0.7–4.0)
MCH: 29.6 pg (ref 26.0–34.0)
MCHC: 33.8 g/dL (ref 30.0–36.0)
MCV: 87.8 fL (ref 78.0–100.0)
Monocytes Absolute: 0.5 10*3/uL (ref 0.1–1.0)
Monocytes Relative: 11 %
Neutro Abs: 3.2 10*3/uL (ref 1.7–7.7)
Neutrophils Relative %: 71 %
Platelets: 331 10*3/uL (ref 150–400)
RBC: 3.61 MIL/uL — ABNORMAL LOW (ref 3.87–5.11)
RDW: 17.4 % — ABNORMAL HIGH (ref 11.5–15.5)
WBC: 4.6 10*3/uL (ref 4.0–10.5)

## 2016-03-17 MED ORDER — SODIUM CHLORIDE 0.9 % IV SOLN
Freq: Once | INTRAVENOUS | Status: AC
  Administered 2016-03-17: 12:00:00 via INTRAVENOUS

## 2016-03-17 MED ORDER — SODIUM CHLORIDE 0.9 % IV SOLN
20.0000 mg | Freq: Once | INTRAVENOUS | Status: AC
  Administered 2016-03-17: 20 mg via INTRAVENOUS
  Filled 2016-03-17: qty 2

## 2016-03-17 MED ORDER — DIPHENHYDRAMINE HCL 50 MG/ML IJ SOLN
50.0000 mg | Freq: Once | INTRAMUSCULAR | Status: AC
  Administered 2016-03-17: 50 mg via INTRAVENOUS
  Filled 2016-03-17: qty 1

## 2016-03-17 MED ORDER — HEPARIN SOD (PORK) LOCK FLUSH 100 UNIT/ML IV SOLN
500.0000 [IU] | Freq: Once | INTRAVENOUS | Status: AC | PRN
  Administered 2016-03-17: 500 [IU]
  Filled 2016-03-17: qty 5

## 2016-03-17 MED ORDER — PACLITAXEL CHEMO INJECTION 300 MG/50ML
80.0000 mg/m2 | Freq: Once | INTRAVENOUS | Status: AC
  Administered 2016-03-17: 132 mg via INTRAVENOUS
  Filled 2016-03-17: qty 22

## 2016-03-17 MED ORDER — FAMOTIDINE IN NACL 20-0.9 MG/50ML-% IV SOLN
20.0000 mg | Freq: Once | INTRAVENOUS | Status: AC
  Administered 2016-03-17: 20 mg via INTRAVENOUS
  Filled 2016-03-17: qty 50

## 2016-03-17 MED ORDER — SODIUM CHLORIDE 0.9% FLUSH
10.0000 mL | INTRAVENOUS | Status: DC | PRN
  Administered 2016-03-17: 10 mL
  Filled 2016-03-17: qty 10

## 2016-03-17 NOTE — Progress Notes (Signed)
Michele Dryer, PA-C [place] [address]  Cancer of central portion of left female breast [facility])  CURRENT THERAPY: Paclitaxel weekly.  INTERVAL HISTORY: Michele Salas 35 y.o. 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 Arnoldo Morale on 12/09/2015.  Now on systemic adjuvant chemotherapy.    Cancer of central portion of left female breast ([place])   09/16/2015 Mammogram    1.6 cm retroareolar left breast mass measuring 1.6 cm in largest diameter in addition to 3 abnormal appearing left inferior axillary lymph nodes.     09/23/2015 Initial Biopsy    US guided core biopsy of left breast mass and suspicious left axillary lymph nodes.     09/23/2015 Pathology Results    Invasive ductal carcinoma, high grade.  Left axilla lymph node is positive for metastatic carcinoma.  ER/PR NEGATIVE, HER2 NEGATIVE, KI-67 marker of 85%.     10/26/2015 Imaging    Bone scan- No evidence of metastatic disease to the skeleton.     10/27/2015 Imaging    CT CAP-  Retroareolar mass with 2 asymmetrically prominent left axillary lymph nodes and an upper normal size left subpectoral lymph node. No other findings of metastatic disease in the chest, abdomen, or pelvis.     11/05/2015 Echocardiogram    The estimated ejection fraction was in the range of 60% to 65%.     11/23/2015 Miscellaneous    Genetic Counseling- genetic test result was negative for any known pathogenic mutations within any of 28 genes that would cause her to be at an increased genetic risk for breast, ovarian, or other related cancers.     12/09/2015 Surgery    L radical mastectomy, axillary LN dissection, port a cath insertion with Dr. Aviva Signs 12/09/2015     12/11/2015 Pathology Results    - INVASIVE GRADE III DUCTAL CARCINOMA, SPANNING 2.3 CM IN GREATEST DIMENSION. - ASSOCIATED HIGH  GRADE DCIS - MARGINS ARE NEGATIVE. 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 - EIGHT ADDITIONAL LYMPH NODES WITH NO TUMOR SEEN     12/11/2015 Pathology Results    HER2 NEGATIVE, ER 0%, PR 0%, Ki-67 marker 90%     12/25/2015 - 02/12/2016 Chemotherapy    AC every 2 weeks x 4 cycles     02/04/2016 Treatment Plan Change    Treatment deferred x 7 days due to neutropenia     02/25/2016 -  Chemotherapy    Paclitaxel weekly x 12      She denies any complaints today.  She denies any peripheral neuropathy symptoms. She denies any nail changes. She denies any abdominal pain. She does note intermittent lower extremity aches that are likely secondary to chemotherapy. She reports that she does not take any anti-inflammatory medication or pain medication during this time of discomfort because it is manageable without intervention.  Review of Systems  Constitutional: Negative.  Negative for chills, fever and weight loss.  HENT: Negative.   Eyes: Negative.  Negative for blurred vision and double vision.  Respiratory: Negative.  Negative for cough and shortness of breath.   Cardiovascular: Negative.  Negative for chest pain and leg swelling.  Gastrointestinal: Negative.  Negative for abdominal pain, constipation, diarrhea, nausea and vomiting.  Genitourinary: Negative.   Musculoskeletal: Negative.   Skin: Negative.  Negative for itching and rash.  Neurological:  Negative.  Negative for weakness and headaches.  Endo/Heme/Allergies: Negative.   Psychiatric/Behavioral: Negative.     Past Medical History:  Diagnosis Date  . Breast cancer, left ([place]) 10/22/2015  . GERD (gastroesophageal reflux disease)   . Iron deficiency anemia 10/27/2015    Past Surgical History:  Procedure Laterality Date  . MASTECTOMY MODIFIED RADICAL Left 12/09/2015   Procedure: LEFT MODIFIED RADICAL MASTECTOMY;  Surgeon: Aviva Signs, MD;  Location: [facility];  Service: General;  Laterality: Left;  . PORTACATH  PLACEMENT Right 12/09/2015   Procedure: INSERTION PORT-A-CATH RIGHT SUBCLAVIAN;  Surgeon: Aviva Signs, MD;  Location: [facility];  Service: General;  Laterality: Right;    Family History  Problem Relation Age of Onset  . Diabetes Mother   . Uterine cancer Paternal Aunt 39  . Heart attack Paternal Uncle     unspecified age  . Heart attack Paternal Grandfather 40  . Other Paternal Uncle     stomach swollen with fluid that had to be removed; lim info    Social History   Social History  . Marital status: Married    Spouse name: N/A  . Number of children: N/A  . Years of education: N/A   Social History Main Topics  . Smoking status: Passive Smoke Exposure - Never Smoker  . Smokeless tobacco: Never Used  . Alcohol use No  . Drug use: No  . Sexual activity: Yes    Birth control/ protection: None   Other Topics Concern  . None   Social History Narrative  . None     PHYSICAL EXAMINATION  ECOG PERFORMANCE STATUS: 0 - Asymptomatic  There were no vitals filed for this visit.  Blood pressure 112/75 Pulse 95 Respirations 18 Temperature 97.7 Oxygen saturation 100% on room air.  GENERAL:alert, no distress, well nourished, well developed, comfortable, cooperative, smiling and no interpreter on site, but accompanied by former cancer patient who participates in interpretation. SKIN: skin color, texture, turgor are normal, no rashes or significant lesions HEAD: Normocephalic, No masses, lesions, tenderness or abnormalities EYES: normal, EOMI, Conjunctiva are pink and non-injected EARS: External ears normal OROPHARYNX:lips, buccal mucosa, and tongue normal and mucous membranes are moist  NECK: supple, trachea midline LYMPH:  not examined BREAST:not examined LUNGS: clear to auscultation bilaterally without wheezes rales or rhonchi HEART: regular rate & rhythm, no murmurs, no gallops, S1 normal and S2 normal ABDOMEN:abdomen soft, non-tender and normal bowel sounds BACK: Back  symmetric, no curvature. EXTREMITIES:less then 2 second capillary refill, no joint deformities, effusion, or inflammation, no skin discoloration, no cyanosis  NEURO: alert & oriented x 3 with fluent speech, no focal motor/sensory deficits, gait normal   LABORATORY DATA: CBC    Component Value Date/Time   WBC 4.6 03/17/2016 1110   RBC 3.61 (L) 03/17/2016 1110   HGB 10.7 (L) 03/17/2016 1110   HCT 31.7 (L) 03/17/2016 1110   PLT 331 03/17/2016 1110   MCV 87.8 03/17/2016 1110   MCH 29.6 03/17/2016 1110   MCHC 33.8 03/17/2016 1110   RDW 17.4 (H) 03/17/2016 1110   LYMPHSABS 0.8 03/17/2016 1110   MONOABS 0.5 03/17/2016 1110   EOSABS 0.1 03/17/2016 1110   BASOSABS 0.0 03/17/2016 1110      Chemistry      Component Value Date/Time   NA 137 03/17/2016 1110   K 3.5 03/17/2016 1110   CL 105 03/17/2016 1110   CO2 26 03/17/2016 1110   BUN 5 (L) 03/17/2016 1110   CREATININE 0.39 (L) 03/17/2016 1110  Component Value Date/Time   CALCIUM 8.9 03/17/2016 1110   ALKPHOS 81 03/17/2016 1110   AST 53 (H) 03/17/2016 1110   ALT 112 (H) 03/17/2016 1110   BILITOT 0.6 03/17/2016 1110        PENDING LABS:   RADIOGRAPHIC STUDIES:  No results found.   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 Arnoldo Morale on 12/09/2015.  Now on systemic adjuvant chemotherapy.  Oncology history is up to date.  Labs today: CBC diff, CMET.  I personally reviewed and went over laboratory results with the patient.  The results are noted within this dictation.  Labs satisfy treatment parameters today.  Mild ALT/AST elevation is noted, but improved.  Continue weekly treatment as planned and return in 2 weeks for follow-up.   ORDERS PLACED FOR THIS ENCOUNTER: No orders of the defined types were placed in this  encounter.   MEDICATIONS PRESCRIBED THIS ENCOUNTER: No orders of the defined types were placed in this encounter.   THERAPY PLAN:  Continue with therapy as planned.  All questions were answered. The patient knows to call the clinic with any problems, questions or concerns. We can certainly see the patient much sooner if necessary.  Patient and plan discussed with Dr. Ancil Linsey and she is in agreement with the aforementioned.   This note is electronically signed by: Doy Mince 03/17/2016 7:05 PM

## 2016-03-17 NOTE — Patient Instructions (Signed)
[facility] Discharge Instructions for Patients Receiving Chemotherapy   Beginning January 23rd 2017 lab work for the [facility] will be done in the  Main lab at [facility] on 1st floor. If you have a lab appointment with the [facility] please come in thru the  Main Entrance and check in at the main information desk   Today you received the following chemotherapy agents:  Taxol  If you develop nausea and vomiting, or diarrhea that is not controlled by your medication, call the clinic.  The clinic phone number is [phone]. Office hours are Monday-Friday 8:30am-5:00pm.  BELOW ARE SYMPTOMS THAT SHOULD BE REPORTED IMMEDIATELY:  *FEVER GREATER THAN 101.0 F  *CHILLS WITH OR WITHOUT FEVER  NAUSEA AND VOMITING THAT IS NOT CONTROLLED WITH YOUR NAUSEA MEDICATION  *UNUSUAL SHORTNESS OF BREATH  *UNUSUAL BRUISING OR BLEEDING  TENDERNESS IN MOUTH AND THROAT WITH OR WITHOUT PRESENCE OF ULCERS  *URINARY PROBLEMS  *BOWEL PROBLEMS  UNUSUAL RASH Items with * indicate a potential emergency and should be followed up as soon as possible. If you have an emergency after office hours please contact your primary care physician or go to the nearest emergency department.  Please call the clinic during office hours if you have any questions or concerns.   You may also contact the Patient Navigator at [phone] should you have any questions or need assistance in obtaining follow up care.      Resources For Cancer Patients and their Caregivers ? American Cancer Society: Can assist with transportation, wigs, general needs, runs Look Good Feel Better.        234-854-1285 ? Cancer Care: Provides financial assistance, online support groups, medication/co-pay assistance.  1-800-813-HOPE 332-288-1524) ? [place] Assists [place] cancer patients and their families through emotional , educational and financial support.  319-809-4249 ? [place] DSS Where to apply for food stamps, Medicaid and utility assistance. 503 051 3163 ? [employer]: Transportation to medical appointments. 531-838-7499 ? Social Security Administration: May apply for disability if have a Stage IV cancer. 816-492-0177 [phone] ? [employer], Disability and Transit Services: Assists with nutrition, care and transit needs. [phone]

## 2016-03-17 NOTE — Patient Instructions (Signed)
[place] at [facility] Discharge Instructions  RECOMMENDATIONS MADE BY THE CONSULTANT AND ANY TEST RESULTS WILL BE SENT TO YOUR REFERRING PHYSICIAN.  You were seen by Gershon Mussel today. Continue weekly treatments with labs  Follow up in 2 weeks.  Thank you for choosing [place] at [facility] to provide your oncology and hematology care.  To afford each patient quality time with our provider, please arrive at least 15 minutes before your scheduled appointment time.   Beginning January 23rd 2017 lab work for the [facility] will be done in the  Main lab at [facility] on 1st floor. If you have a lab appointment with the [facility] please come in thru the  Main Entrance and check in at the main information desk  You need to re-schedule your appointment should you arrive 10 or more minutes late.  We strive to give you quality time with our providers, and arriving late affects you and other patients whose appointments are after yours.  Also, if you no show three or more times for appointments you may be dismissed from the clinic at the providers discretion.     Again, thank you for choosing [facility].  Our hope is that these requests will decrease the amount of time that you wait before being seen by our physicians.       _____________________________________________________________  Should you have questions after your visit to [facility], please contact our office at [phone] between the hours of 8:30 a.m. and 4:30 p.m.  Voicemails left after 4:30 p.m. will not be returned until the following business day.  For prescription refill requests, have your pharmacy contact our office.         Resources For Cancer Patients and their Caregivers ? American Cancer Society: Can assist with transportation, wigs, general needs, runs Look Good Feel Better.        (508)483-3646 ? Cancer Care: Provides financial  assistance, online support groups, medication/co-pay assistance.  1-800-813-HOPE (502)429-4581) ? [place] Assists [place] cancer patients and their families through emotional , educational and financial support.  [phone] ? [place] DSS Where to apply for food stamps, Medicaid and utility assistance. 765-129-9381 ? RC[employer]: Transportation to medical appointments. 281-251-5530 ? Social Security Administration: May apply for disability if have a Stage IV cancer. 669-765-8847 719-597-6166 ? [employer], Disability and Transit Services: Assists with nutrition, care and transit needs. [place] Support Programs: @10RELATIVEDAYS @ > Cancer Support Group  2nd Tuesday of the month 1pm-2pm, Journey Room  > Creative Journey  3rd Tuesday of the month 1130am-1pm, Journey Room  > Look Good Feel Better  1st Wednesday of the month 10am-12 noon, Journey Room (Call [place] to register [phone])

## 2016-03-17 NOTE — Progress Notes (Signed)
Tolerated infusion w/o adverse reaction.  VSS.

## 2016-03-17 NOTE — Assessment & Plan Note (Signed)
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 Arnoldo Morale on 12/09/2015.  Now on systemic adjuvant chemotherapy.  Oncology history is up to date.  Labs today: CBC diff, CMET.  I personally reviewed and went over laboratory results with the patient.  The results are noted within this dictation.  Labs satisfy treatment parameters today.  Mild ALT/AST elevation is noted, but improved.  Continue weekly treatment as planned and return in 2 weeks for follow-up.

## 2016-03-24 ENCOUNTER — Inpatient Hospital Stay (HOSPITAL_COMMUNITY): Payer: Self-pay

## 2016-03-24 ENCOUNTER — Encounter (HOSPITAL_BASED_OUTPATIENT_CLINIC_OR_DEPARTMENT_OTHER): Payer: Self-pay

## 2016-03-24 VITALS — BP 115/65 | HR 92 | Temp 98.2°F | Resp 18 | Wt 136.0 lb

## 2016-03-24 DIAGNOSIS — C773 Secondary and unspecified malignant neoplasm of axilla and upper limb lymph nodes: Secondary | ICD-10-CM

## 2016-03-24 DIAGNOSIS — Z5111 Encounter for antineoplastic chemotherapy: Secondary | ICD-10-CM

## 2016-03-24 DIAGNOSIS — C50112 Malignant neoplasm of central portion of left female breast: Secondary | ICD-10-CM

## 2016-03-24 LAB — CBC WITH DIFFERENTIAL/PLATELET
Basophils Absolute: 0 10*3/uL (ref 0.0–0.1)
Basophils Relative: 0 %
Eosinophils Absolute: 0.1 10*3/uL (ref 0.0–0.7)
Eosinophils Relative: 2 %
HCT: 32.3 % — ABNORMAL LOW (ref 36.0–46.0)
Hemoglobin: 10.9 g/dL — ABNORMAL LOW (ref 12.0–15.0)
Lymphocytes Relative: 18 %
Lymphs Abs: 0.9 10*3/uL (ref 0.7–4.0)
MCH: 29.9 pg (ref 26.0–34.0)
MCHC: 33.7 g/dL (ref 30.0–36.0)
MCV: 88.7 fL (ref 78.0–100.0)
Monocytes Absolute: 0.5 10*3/uL (ref 0.1–1.0)
Monocytes Relative: 9 %
Neutro Abs: 3.8 10*3/uL (ref 1.7–7.7)
Neutrophils Relative %: 71 %
Platelets: 340 10*3/uL (ref 150–400)
RBC: 3.64 MIL/uL — ABNORMAL LOW (ref 3.87–5.11)
RDW: 17.4 % — ABNORMAL HIGH (ref 11.5–15.5)
WBC: 5.4 10*3/uL (ref 4.0–10.5)

## 2016-03-24 LAB — COMPREHENSIVE METABOLIC PANEL
ALT: 96 U/L — ABNORMAL HIGH (ref 14–54)
AST: 54 U/L — ABNORMAL HIGH (ref 15–41)
Albumin: 4.1 g/dL (ref 3.5–5.0)
Alkaline Phosphatase: 81 U/L (ref 38–126)
Anion gap: 5 (ref 5–15)
BUN: 6 mg/dL (ref 6–20)
CO2: 27 mmol/L (ref 22–32)
Calcium: 9.2 mg/dL (ref 8.9–10.3)
Chloride: 105 mmol/L (ref 101–111)
Creatinine, Ser: 0.44 mg/dL (ref 0.44–1.00)
GFR calc Af Amer: >60 mL/min (ref >60)
GFR calc non Af Amer: >60 mL/min (ref >60)
Glucose, Bld: 130 mg/dL — ABNORMAL HIGH (ref 65–99)
Potassium: 3.5 mmol/L (ref 3.5–5.1)
Sodium: 137 mmol/L (ref 135–145)
Total Bilirubin: 0.8 mg/dL (ref 0.3–1.2)
Total Protein: 7.2 g/dL (ref 6.5–8.1)

## 2016-03-24 MED ORDER — SODIUM CHLORIDE 0.9% FLUSH: 10.0000 mL | INTRAVENOUS | Status: DC | PRN

## 2016-03-24 MED ORDER — SODIUM CHLORIDE 0.9 % IV SOLN
20.0000 mg | Freq: Once | INTRAVENOUS | Status: AC
  Administered 2016-03-24: 20 mg via INTRAVENOUS
  Filled 2016-03-24: qty 2

## 2016-03-24 MED ORDER — SODIUM CHLORIDE 0.9 % IV SOLN
Freq: Once | INTRAVENOUS | Status: AC
  Administered 2016-03-24: 12:00:00 via INTRAVENOUS

## 2016-03-24 MED ORDER — FAMOTIDINE IN NACL 20-0.9 MG/50ML-% IV SOLN
INTRAVENOUS | Status: AC
  Filled 2016-03-24: qty 50

## 2016-03-24 MED ORDER — PACLITAXEL CHEMO INJECTION 300 MG/50ML
80.0000 mg/m2 | Freq: Once | INTRAVENOUS | Status: AC
  Administered 2016-03-24: 132 mg via INTRAVENOUS
  Filled 2016-03-24: qty 22

## 2016-03-24 MED ORDER — HEPARIN SOD (PORK) LOCK FLUSH 100 UNIT/ML IV SOLN
INTRAVENOUS | Status: AC
  Filled 2016-03-24: qty 5

## 2016-03-24 MED ORDER — DIPHENHYDRAMINE HCL 50 MG/ML IJ SOLN
INTRAMUSCULAR | Status: AC
  Filled 2016-03-24: qty 1

## 2016-03-24 MED ORDER — DIPHENHYDRAMINE HCL 50 MG/ML IJ SOLN
50.0000 mg | Freq: Once | INTRAMUSCULAR | Status: AC
  Administered 2016-03-24: 50 mg via INTRAVENOUS

## 2016-03-24 MED ORDER — FAMOTIDINE IN NACL 20-0.9 MG/50ML-% IV SOLN
20.0000 mg | Freq: Once | INTRAVENOUS | Status: AC
  Administered 2016-03-24: 20 mg via INTRAVENOUS

## 2016-03-24 MED ORDER — HEPARIN SOD (PORK) LOCK FLUSH 100 UNIT/ML IV SOLN
500.0000 [IU] | Freq: Once | INTRAVENOUS | Status: AC | PRN
  Administered 2016-03-24: 500 [IU]

## 2016-03-24 NOTE — Patient Instructions (Signed)
[facility] Discharge Instructions for Patients Receiving Chemotherapy   Beginning January 23rd 2017 lab work for the [facility] will be done in the  Main lab at [facility] on 1st floor. If you have a lab appointment with the [facility] please come in thru the  Main Entrance and check in at the main information desk   Today you received the following chemotherapy agent: Taxol today.     If you develop nausea and vomiting, or diarrhea that is not controlled by your medication, call the clinic.  The clinic phone number is [phone]. Office hours are Monday-Friday 8:30am-5:00pm.  BELOW ARE SYMPTOMS THAT SHOULD BE REPORTED IMMEDIATELY:  *FEVER GREATER THAN 101.0 F  *CHILLS WITH OR WITHOUT FEVER  NAUSEA AND VOMITING THAT IS NOT CONTROLLED WITH YOUR NAUSEA MEDICATION  *UNUSUAL SHORTNESS OF BREATH  *UNUSUAL BRUISING OR BLEEDING  TENDERNESS IN MOUTH AND THROAT WITH OR WITHOUT PRESENCE OF ULCERS  *URINARY PROBLEMS  *BOWEL PROBLEMS  UNUSUAL RASH Items with * indicate a potential emergency and should be followed up as soon as possible. If you have an emergency after office hours please contact your primary care physician or go to the nearest emergency department.  Please call the clinic during office hours if you have any questions or concerns.   You may also contact the Patient Navigator at [phone] should you have any questions or need assistance in obtaining follow up care.      Resources For Cancer Patients and their Caregivers ? American Cancer Society: Can assist with transportation, wigs, general needs, runs Look Good Feel Better.        912-826-3789 ? Cancer Care: Provides financial assistance, online support groups, medication/co-pay assistance.  1-800-813-HOPE 934-760-6354) ? [place] Assists [place] cancer patients and their families through emotional , educational and financial support.   (563)317-7844 ? [place] DSS Where to apply for food stamps, Medicaid and utility assistance. (848)235-7905 ? [employer]ATS: Transportation to medical appointments. (561)082-2055 ? Social Security Administration: May apply for disability if have a Stage IV cancer. [phone] ? [employer], Disability and Transit Services: Assists with nutrition, care and transit needs. [phone]

## 2016-03-24 NOTE — Progress Notes (Signed)
Patient tolerated infusion well.  No sign of distress.  VSS.  Ambulatory out of clinic.

## 2016-03-30 NOTE — Progress Notes (Signed)
[facility] Hematology/Oncology Progress Note  Name: Michele Salas      MRN: [id number]    Date: 03/30/2016 Time:10:12 AM   REFERRING PHYSICIAN:  Aviva Signs, MD (Gen Surg)  REASON FOR CONSULT:  Left sided breast cancer   DIAGNOSIS:  Invasive ductal carcinoma, high grade, of left breast.  HISTORY OF PRESENT ILLNESS:   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     Cancer of central portion of left female breast ([place])   09/16/2015 Mammogram    1.6 cm retroareolar left breast mass measuring 1.6 cm in largest diameter in addition to 3 abnormal appearing left inferior axillary lymph nodes.      09/23/2015 Initial Biopsy    US guided core biopsy of left breast mass and suspicious left axillary lymph nodes.      09/23/2015 Pathology Results    Invasive ductal carcinoma, high grade.  Left axilla lymph node is positive for metastatic carcinoma.  ER/PR NEGATIVE, HER2 NEGATIVE, KI-67 marker of 85%.      10/26/2015 Imaging    Bone scan- No evidence of metastatic disease to the skeleton.      10/27/2015 Imaging    CT CAP-  Retroareolar mass with 2 asymmetrically prominent left axillary lymph nodes and an upper normal size left subpectoral lymph node. No other findings of metastatic disease in the chest, abdomen, or pelvis.      11/05/2015 Echocardiogram    The estimated ejection fraction was in the range of 60% to 65%.      11/23/2015 Miscellaneous    Genetic Counseling- genetic test result was negative for any known pathogenic mutations within any of 28 genes that would cause her to be at an increased genetic risk for breast, ovarian, or other related cancers.      12/09/2015 Surgery    L radical mastectomy, axillary LN dissection, port a cath insertion with Dr. Aviva Signs 12/09/2015      12/11/2015 Pathology Results    - INVASIVE GRADE III DUCTAL CARCINOMA,  SPANNING 2.3 CM IN GREATEST DIMENSION. - ASSOCIATED HIGH GRADE DCIS - MARGINS ARE NEGATIVE. 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 - EIGHT ADDITIONAL LYMPH NODES WITH NO TUMOR SEEN      12/11/2015 Pathology Results    HER2 NEGATIVE, ER 0%, PR 0%, Ki-67 marker 90%      12/25/2015 - 02/12/2016 Chemotherapy    AC every 2 weeks x 4 cycles      02/04/2016 Treatment Plan Change    Treatment deferred x 7 days due to neutropenia      02/25/2016 -  Chemotherapy    Paclitaxel weekly x 12      [name] returns to the [facility] today accompanied by a friend who serves as her Optometrist. She has no complaints. She notes that she eats well. Sleeping is an issue but she does not desire medication to help. She does have hot flashes. Cycles are irregular. No abdominal pain. No palpable abnormalities on the chest wall. No diarrhea or constipation. Fleeting nausea, no vomiting.   She has questions regarding radiation therapy. She denies any symptoms of neuropathy.    Review of Systems  Constitutional: Negative for chills, diaphoresis, fever, malaise/fatigue and weight loss.  HENT: Negative for congestion and sore throat.   Eyes: Negative for blurred vision, double vision and redness.  Respiratory:  Negative for cough, hemoptysis, sputum production, shortness of breath and wheezing.   Cardiovascular: Negative for chest pain, palpitations and leg swelling.  Gastrointestinal: Positive for nausea. Negative for abdominal pain, blood in stool, constipation, diarrhea, heartburn, melena and vomiting.  Genitourinary: Negative for dysuria, frequency and hematuria.  Musculoskeletal: Negative for back pain, falls, joint pain, myalgias and neck pain.  Skin: Negative for itching and rash.  Neurological: Negative for dizziness, tingling, tremors, sensory change, speech change, focal weakness and weakness.  Endo/Heme/Allergies: Negative.   Psychiatric/Behavioral: Negative for  depression, hallucinations, substance abuse and suicidal ideas. The patient is not nervous/anxious and does not have insomnia.   14 point review of systems was performed and is negative except as detailed under history of present illness and above  HORMONAL RISK FACTORS:  Menarche was at age 33.  First live birth at age 60.  OCP use for approximately not sure how long she took birth control for, but reports that she has not taken in 36 years.  Ovaries intact: yes.  Hysterectomy: no.  Menopausal status: premenopausal, but periods are irregular. HRT use: 0 years. Colonoscopy: n/a; not examined. Mammogram within the last year: this was her first mammogram. Number of breast biopsies: 1. Up to date with pelvic exams: yes. Any excessive radiation exposure in the past: no   PAST MEDICAL HISTORY:   Past Medical History:  Diagnosis Date  . Breast cancer, left ([place]) 10/22/2015  . GERD (gastroesophageal reflux disease)   . Iron deficiency anemia 10/27/2015    ALLERGIES: No Known Allergies    MEDICATIONS: I have reviewed the patient's current medications.    Current Outpatient Prescriptions on File Prior to Visit  Medication Sig Dispense Refill  . lidocaine-prilocaine (EMLA) cream Apply a quarter size amount to port site 1 hour prior to chemo. Do not rub in. Cover with plastic wrap. 30 g 3  . omeprazole (PRILOSEC) 40 MG capsule Take 1 capsule (40 mg total) by mouth daily. 30 capsule 3  . ondansetron (ZOFRAN) 8 MG tablet Take 1 tablet (8 mg total) by mouth every 8 (eight) hours as needed for nausea or vomiting. 30 tablet 2  . oxyCODONE-acetaminophen (PERCOCET/ROXICET) 5-325 MG tablet Take 1-2 tablets by mouth every 4 (four) hours as needed for moderate pain. 40 tablet 0  . Pegfilgrastim (NEULASTA ONPRO [place]) Inject into the skin. To be given 27 hours after the completion of chemo.    . prochlorperazine (COMPAZINE) 10 MG tablet Take 1 tablet (10 mg total) by mouth every 6 (six) hours as  needed for nausea or vomiting. 30 tablet 2  . propranolol (INDERAL) 20 MG tablet 1 po bid. Tome una tableta por boca dos veces diarias 60 tablet 3   No current facility-administered medications on file prior to visit.      PAST SURGICAL HISTORY Past Surgical History:  Procedure Laterality Date  . MASTECTOMY MODIFIED RADICAL Left 12/09/2015   Procedure: LEFT MODIFIED RADICAL MASTECTOMY;  Surgeon: Aviva Signs, MD;  Location: [facility];  Service: General;  Laterality: Left;  . PORTACATH PLACEMENT Right 12/09/2015   Procedure: INSERTION PORT-A-CATH RIGHT SUBCLAVIAN;  Surgeon: Aviva Signs, MD;  Location: [facility];  Service: General;  Laterality: Right;    FAMILY HISTORY: Family History  Problem Relation Age of Onset  . Diabetes Mother   . Uterine cancer Paternal Aunt 81  . Heart attack Paternal Uncle     unspecified age  . Heart attack Paternal Grandfather 15  . Other Paternal Uncle  stomach swollen with fluid that had to be removed; lim info  Mother is alive at the age of 47. She has hypertension and diabetes. She lives in [place]. Father is deceased at unknown age and for unknown reason. She reports "he was old." Brothers 73 years old and healthy. She has many half siblings, many of whom live in [place]. She has 3 children. She is a 34 year old daughter who lives in [place], 36 year old son who lives here in [place], and 12 year old daughter who lives in [place]. All children are healthy.  SOCIAL HISTORY: Patient denies any tobacco abuse. Rarely does she have alcoholic beverage. She denies any illicit drug abuse. 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 She is religious and Catholic. She is not a documented citizen of the [place]. She is married.  PERFORMANCE STATUS: The patient's performance status is 0 - Asymptomatic Vitals - 1 value per visit 8/75/6433  SYSTOLIC 295  DIASTOLIC 63  Pulse 93  Temperature 98.6  Respirations 18  Weight (lb) 138.6    Height   BMI 25.35  VISIT REPORT    PHYSICAL EXAM: General appearance: alert, cooperative, appears stated age, no distress and Hispanic, non-english speaking,  Alopecia Head: Normocephalic, without obvious abnormality, atraumatic Eyes: negative findings: lids and lashes normal, conjunctivae and sclerae normal and corneas clear Throat: lips, mucosa, and tongue normal; teeth and gums normal Neck: no adenopathy, supple, symmetrical, trachea midline and thyroid not enlarged, symmetric, no tenderness/mass/nodules Lungs: clear to auscultation bilaterally and normal percussion bilaterally Breasts: Post mastectomy site is well healed Heart: regular rate and rhythm, S1, S2 normal, no murmur, click, rub or gallop Abdomen: soft, non-tender; bowel sounds normal; no masses,  no organomegaly Extremities: extremities normal, atraumatic, no cyanosis or edema Skin: Skin color, texture, turgor normal. No rashes or lesions Lymph nodes: Axillary adenopathy: left anterior line adenopathy Neurologic: Alert and oriented X 3, normal strength and tone. Normal symmetric reflexes. Normal coordination and gait  LABORATORY DATA:  I have reviewed the data as listed.   Results for LAVERA, VANDERMEER (MRN [id number]) as of 04/02/2016 18:09  Ref. Range 03/31/2016 10:05  Sodium Latest Ref Range: 135 - 145 mmol/L 136  Potassium Latest Ref Range: 3.5 - 5.1 mmol/L 3.7  Chloride Latest Ref Range: 101 - 111 mmol/L 106  CO2 Latest Ref Range: 22 - 32 mmol/L 24  BUN Latest Ref Range: 6 - 20 mg/dL 8  Creatinine Latest Ref Range: 0.44 - 1.00 mg/dL 0.42 (L)  Calcium Latest Ref Range: 8.9 - 10.3 mg/dL 9.0  EGFR (Non-African Amer.) Latest Ref Range: >60 mL/min >60  EGFR (African American) Latest Ref Range: >60 mL/min >60  Glucose Latest Ref Range: 65 - 99 mg/dL 124 (H)  Anion gap Latest Ref Range: 5 - 15  6  Alkaline Phosphatase Latest Ref Range: 38 - 126 U/L 91  Albumin Latest Ref Range: 3.5 - 5.0 g/dL 4.1  AST Latest Ref  Range: 15 - 41 U/L 48 (H)  ALT Latest Ref Range: 14 - 54 U/L 93 (H)  Total Protein Latest Ref Range: 6.5 - 8.1 g/dL 7.0  Total Bilirubin Latest Ref Range: 0.3 - 1.2 mg/dL 0.7  WBC Latest Ref Range: 4.0 - 10.5 K/uL 5.8  RBC Latest Ref Range: 3.87 - 5.11 MIL/uL 3.63 (L)  Hemoglobin Latest Ref Range: 12.0 - 15.0 g/dL 10.8 (L)  HCT Latest Ref Range: 36.0 - 46.0 % 32.5 (L)  MCV Latest Ref Range: 78.0 - 100.0 fL 89.5  MCH Latest Ref Range: 26.0 - 34.0 pg 29.8  MCHC Latest Ref Range: 30.0 - 36.0 g/dL 33.2  RDW Latest Ref Range: 11.5 - 15.5 % 17.1 (H)  Platelets Latest Ref Range: 150 - 400 K/uL 382  Neutrophils Latest Units: % 73  Lymphocytes Latest Units: % 16  Monocytes Relative Latest Units: % 9  Eosinophil Latest Units: % 2  Basophil Latest Units: % 0  NEUT# Latest Ref Range: 1.7 - 7.7 K/uL 4.2  Lymphocyte # Latest Ref Range: 0.7 - 4.0 K/uL 0.9  Monocyte # Latest Ref Range: 0.1 - 1.0 K/uL 0.5  Eosinophils Absolute Latest Ref Range: 0.0 - 0.7 K/uL 0.1  Basophils Absolute Latest Ref Range: 0.0 - 0.1 K/uL 0.0    Results for KIEREN, RICCI (MRN [id number]) as of 04/02/2016 18:09  Ref. Range 02/04/2016 10:30 02/12/2016 09:40 02/25/2016 08:30 03/03/2016 10:49 03/10/2016 10:31 03/17/2016 11:10 03/24/2016 10:35 03/31/2016 10:05  AST Latest Ref Range: 15 - 41 U/L 26 73 (H) 36 53 (H) 58 (H) 53 (H) 54 (H) 48 (H)  ALT Latest Ref Range: 14 - 54 U/L 46 115 (H) 56 (H) 91 (H) 125 (H) 112 (H) 96 (H) 93 (H)    RADIOGRAPHY: I have personally reviewed the radiological images as listed and agreed with the findings in the report. Study Result     CLINICAL DATA: Left breast cancer  EXAM: CHEST 2 VIEW  COMPARISON: CT 10/27/2015  FINDINGS: Calcified granuloma in the right upper lobe, stable. Heart is normal size. No confluent opacities or effusions. No acute bony abnormality.  IMPRESSION: No active cardiopulmonary disease.   Electronically Signed  By: Rolm Baptise M.D.  On: 12/04/2015  11:53    PATHOLOGY:      [facility]:  Biopsy of left breast mass at the 12:00 position demonstrates invasive ductal carcinoma, high-grade.  Left axillary lymph node biopsy demonstrates invasive ductal carcinoma, high-grade, consistent with metastasis to lymph node. Breast prognostic panel is performed demonstrating ER/PR negativity, HER-2/neu negativity, and a KI-67 Marker of 85%.    ASSESSMENT/PLAN:  Iron deficiency anemia She did well with IV iron replacement. She was severely iron deficiency and iron stores were replaced in preparation for chemotherapy. Iron levels will be followed.  Cancer of central portion of left female breast ([place]) STAGE IIB triple negative carcinoma of L breast Treatment related nausea without vomiting Bandemia Transaminitis  She will continue with weekly taxol. She has a mild transaminitis, slightly improved today. I suspect her LFT abnormalities are treatment related. We will continue to closely monitor. No indication to interrupt therapy or dose reduce.   She has completed AC X 4. She denies any questions or concerns. She is overall doing well.  I will see her back in two weeks with labs and ongoing chemotherapy.  We discussed neuropathy that can occur with taxol. She notes she understands.   Follow-up as detailed.   All questions were answered. The patient knows to call the clinic with any problems, questions or concerns. We can certainly see the patient much sooner if necessary.  This document serves as a record of services personally performed by Ancil Linsey, MD. It was created on her behalf by Arlyce Harman, a trained medical scribe. The creation of this record is based on the scribe's personal observations and the provider's statements to them. This document has been checked and approved by the attending provider.  I have reviewed the above documentation for accuracy and completeness, and I agree with the above.  This note is electronically signed by: Molli Hazard, MD   03/30/2016 10:12 AM

## 2016-03-31 ENCOUNTER — Encounter (HOSPITAL_BASED_OUTPATIENT_CLINIC_OR_DEPARTMENT_OTHER): Payer: Self-pay | Admitting: Hematology & Oncology

## 2016-03-31 ENCOUNTER — Encounter (HOSPITAL_COMMUNITY): Payer: Self-pay | Admitting: Hematology & Oncology

## 2016-03-31 ENCOUNTER — Encounter (HOSPITAL_BASED_OUTPATIENT_CLINIC_OR_DEPARTMENT_OTHER): Payer: Self-pay

## 2016-03-31 VITALS — BP 112/66 | HR 88 | Temp 98.1°F | Resp 18

## 2016-03-31 VITALS — BP 117/63 | HR 93 | Temp 98.6°F | Resp 18 | Wt 138.6 lb

## 2016-03-31 DIAGNOSIS — C50112 Malignant neoplasm of central portion of left female breast: Secondary | ICD-10-CM

## 2016-03-31 DIAGNOSIS — R740 Nonspecific elevation of levels of transaminase and lactic acid dehydrogenase [LDH]: Secondary | ICD-10-CM

## 2016-03-31 DIAGNOSIS — R7401 Elevation of levels of liver transaminase levels: Secondary | ICD-10-CM

## 2016-03-31 DIAGNOSIS — C50919 Malignant neoplasm of unspecified site of unspecified female breast: Secondary | ICD-10-CM

## 2016-03-31 DIAGNOSIS — D509 Iron deficiency anemia, unspecified: Secondary | ICD-10-CM

## 2016-03-31 DIAGNOSIS — Z5111 Encounter for antineoplastic chemotherapy: Secondary | ICD-10-CM

## 2016-03-31 DIAGNOSIS — Z171 Estrogen receptor negative status [ER-]: Secondary | ICD-10-CM

## 2016-03-31 DIAGNOSIS — C773 Secondary and unspecified malignant neoplasm of axilla and upper limb lymph nodes: Secondary | ICD-10-CM

## 2016-03-31 DIAGNOSIS — R110 Nausea: Secondary | ICD-10-CM

## 2016-03-31 DIAGNOSIS — D72825 Bandemia: Secondary | ICD-10-CM

## 2016-03-31 LAB — CBC WITH DIFFERENTIAL/PLATELET
Basophils Absolute: 0 10*3/uL (ref 0.0–0.1)
Basophils Relative: 0 %
Eosinophils Absolute: 0.1 10*3/uL (ref 0.0–0.7)
Eosinophils Relative: 2 %
HCT: 32.5 % — ABNORMAL LOW (ref 36.0–46.0)
Hemoglobin: 10.8 g/dL — ABNORMAL LOW (ref 12.0–15.0)
Lymphocytes Relative: 16 %
Lymphs Abs: 0.9 10*3/uL (ref 0.7–4.0)
MCH: 29.8 pg (ref 26.0–34.0)
MCHC: 33.2 g/dL (ref 30.0–36.0)
MCV: 89.5 fL (ref 78.0–100.0)
Monocytes Absolute: 0.5 10*3/uL (ref 0.1–1.0)
Monocytes Relative: 9 %
Neutro Abs: 4.2 10*3/uL (ref 1.7–7.7)
Neutrophils Relative %: 73 %
Platelets: 382 10*3/uL (ref 150–400)
RBC: 3.63 MIL/uL — ABNORMAL LOW (ref 3.87–5.11)
RDW: 17.1 % — ABNORMAL HIGH (ref 11.5–15.5)
WBC: 5.8 10*3/uL (ref 4.0–10.5)

## 2016-03-31 LAB — COMPREHENSIVE METABOLIC PANEL
ALT: 93 U/L — ABNORMAL HIGH (ref 14–54)
AST: 48 U/L — ABNORMAL HIGH (ref 15–41)
Albumin: 4.1 g/dL (ref 3.5–5.0)
Alkaline Phosphatase: 91 U/L (ref 38–126)
Anion gap: 6 (ref 5–15)
BUN: 8 mg/dL (ref 6–20)
CO2: 24 mmol/L (ref 22–32)
Calcium: 9 mg/dL (ref 8.9–10.3)
Chloride: 106 mmol/L (ref 101–111)
Creatinine, Ser: 0.42 mg/dL — ABNORMAL LOW (ref 0.44–1.00)
GFR calc Af Amer: >60 mL/min (ref >60)
GFR calc non Af Amer: >60 mL/min (ref >60)
Glucose, Bld: 124 mg/dL — ABNORMAL HIGH (ref 65–99)
Potassium: 3.7 mmol/L (ref 3.5–5.1)
Sodium: 136 mmol/L (ref 135–145)
Total Bilirubin: 0.7 mg/dL (ref 0.3–1.2)
Total Protein: 7 g/dL (ref 6.5–8.1)

## 2016-03-31 MED ORDER — FAMOTIDINE IN NACL 20-0.9 MG/50ML-% IV SOLN
20.0000 mg | Freq: Once | INTRAVENOUS | Status: AC
  Administered 2016-03-31: 20 mg via INTRAVENOUS
  Filled 2016-03-31: qty 50

## 2016-03-31 MED ORDER — SODIUM CHLORIDE 0.9 % IV SOLN
Freq: Once | INTRAVENOUS | Status: AC
  Administered 2016-03-31: 11:00:00 via INTRAVENOUS

## 2016-03-31 MED ORDER — DIPHENHYDRAMINE HCL 50 MG/ML IJ SOLN
50.0000 mg | Freq: Once | INTRAMUSCULAR | Status: AC
  Administered 2016-03-31: 50 mg via INTRAVENOUS
  Filled 2016-03-31: qty 1

## 2016-03-31 MED ORDER — SODIUM CHLORIDE 0.9% FLUSH
10.0000 mL | INTRAVENOUS | Status: DC | PRN
  Administered 2016-03-31: 10 mL
  Filled 2016-03-31: qty 10

## 2016-03-31 MED ORDER — HEPARIN SOD (PORK) LOCK FLUSH 100 UNIT/ML IV SOLN
500.0000 [IU] | Freq: Once | INTRAVENOUS | Status: AC | PRN
  Administered 2016-03-31: 500 [IU]
  Filled 2016-03-31: qty 5

## 2016-03-31 MED ORDER — PACLITAXEL CHEMO INJECTION 300 MG/50ML
80.0000 mg/m2 | Freq: Once | INTRAVENOUS | Status: AC
  Administered 2016-03-31: 132 mg via INTRAVENOUS
  Filled 2016-03-31: qty 22

## 2016-03-31 MED ORDER — SODIUM CHLORIDE 0.9 % IV SOLN
20.0000 mg | Freq: Once | INTRAVENOUS | Status: AC
  Administered 2016-03-31: 20 mg via INTRAVENOUS
  Filled 2016-03-31: qty 2

## 2016-03-31 NOTE — Patient Instructions (Signed)
[facility] Discharge Instructions for Patients Receiving Chemotherapy   Beginning January 23rd 2017 lab work for the [facility] will be done in the  Main lab at [facility] on 1st floor. If you have a lab appointment with the [facility] please come in thru the  Main Entrance and check in at the main information desk   Today you received the following chemotherapy agents Taxol. Follow-up as scheduled. Call clinic for any questions or concerns  To help prevent nausea and vomiting after your treatment, we encourage you to take your nausea medication   If you develop nausea and vomiting, or diarrhea that is not controlled by your medication, call the clinic.  The clinic phone number is [phone]. Office hours are Monday-Friday 8:30am-5:00pm.  BELOW ARE SYMPTOMS THAT SHOULD BE REPORTED IMMEDIATELY:  *FEVER GREATER THAN 101.0 F  *CHILLS WITH OR WITHOUT FEVER  NAUSEA AND VOMITING THAT IS NOT CONTROLLED WITH YOUR NAUSEA MEDICATION  *UNUSUAL SHORTNESS OF BREATH  *UNUSUAL BRUISING OR BLEEDING  TENDERNESS IN MOUTH AND THROAT WITH OR WITHOUT PRESENCE OF ULCERS  *URINARY PROBLEMS  *BOWEL PROBLEMS  UNUSUAL RASH Items with * indicate a potential emergency and should be followed up as soon as possible. If you have an emergency after office hours please contact your primary care physician or go to the nearest emergency department.  Please call the clinic during office hours if you have any questions or concerns.   You may also contact the Patient Navigator at [phone] should you have any questions or need assistance in obtaining follow up care.      Resources For Cancer Patients and their Caregivers ? American Cancer Society: Can assist with transportation, wigs, general needs, runs Look Good Feel Better.        (484)204-9569 ? Cancer Care: Provides financial assistance, online support groups, medication/co-pay assistance.  1-800-813-HOPE  7325702539) ? [place] Assists [place] cancer patients and their families through emotional , educational and financial support.  (617)386-4625 ? [place] DSS Where to apply for food stamps, Medicaid and utility assistance. (585)007-6482 ? RCATS: Transportation to medical appointments. 618-395-1235 ? Social Security Administration: May apply for disability if have a Stage IV cancer. 510-130-9429 601-297-6591 ? [employer], Disability and Transit Services: Assists with nutrition, care and transit needs. [phone]

## 2016-03-31 NOTE — Progress Notes (Signed)
Michele Salas tolerated chemo tx well without complaints. VSS upon discharge. Pt discharged self ambulatory in satisfactory condition with family

## 2016-03-31 NOTE — Patient Instructions (Addendum)
[place] at [facility] Discharge Instructions  RECOMMENDATIONS MADE BY THE CONSULTANT AND ANY TEST RESULTS WILL BE SENT TO YOUR REFERRING PHYSICIAN.  You saw Dr. Whitney Muse today. Follow up in 2 weeks with [facility] that day also. Continue weekly taxol  Thank you for choosing [place] at [facility] to provide your oncology and hematology care.  To afford each patient quality time with our provider, please arrive at least 15 minutes before your scheduled appointment time.   Beginning January 23rd 2017 lab work for the [facility] will be done in the  Main lab at [facility] on 1st floor. If you have a lab appointment with the [facility] please come in thru the  Main Entrance and check in at the main information desk  You need to re-schedule your appointment should you arrive 10 or more minutes late.  We strive to give you quality time with our providers, and arriving late affects you and other patients whose appointments are after yours.  Also, if you no show three or more times for appointments you may be dismissed from the clinic at the providers discretion.     Again, thank you for choosing [facility].  Our hope is that these requests will decrease the amount of time that you wait before being seen by our physicians.       _____________________________________________________________  Should you have questions after your visit to [facility], please contact our office at [phone] between the hours of 8:30 a.m. and 4:30 p.m.  Voicemails left after 4:30 p.m. will not be returned until the following business day.  For prescription refill requests, have your pharmacy contact our office.         Resources For Cancer Patients and their Caregivers ? American Cancer Society: Can assist with transportation, wigs, general needs, runs Look Good Feel Better.        562-541-5966 ? Cancer Care: Provides  financial assistance, online support groups, medication/co-pay assistance.  1-800-813-HOPE (347)315-8052) ? [place] Assists [place] cancer patients and their families through emotional , educational and financial support.  [phone] ? [place] DSS Where to apply for food stamps, Medicaid and utility assistance. 413-212-3541 ? RCATS: Transportation to medical appointments. (234) 682-8967 ? Social Security Administration: May apply for disability if have a Stage IV cancer. 831-516-9814 (380) [phone] ? [employer], Disability and Transit Services: Assists with nutrition, care and transit needs. [place] Support Programs: @10RELATIVEDAYS @ > Cancer Support Group  2nd Tuesday of the month 1pm-2pm, Journey Room  > Creative Journey  3rd Tuesday of the month 1130am-1pm, Journey Room  > Look Good Feel Better  1st Wednesday of the month 10am-12 noon, Journey Room (Call [place] to register [phone])

## 2016-04-02 ENCOUNTER — Encounter (HOSPITAL_COMMUNITY): Payer: Self-pay | Admitting: Hematology & Oncology

## 2016-04-07 ENCOUNTER — Encounter (HOSPITAL_BASED_OUTPATIENT_CLINIC_OR_DEPARTMENT_OTHER): Payer: Self-pay

## 2016-04-07 VITALS — BP 119/72 | HR 93 | Temp 98.5°F | Resp 18 | Wt 140.2 lb

## 2016-04-07 DIAGNOSIS — C50112 Malignant neoplasm of central portion of left female breast: Secondary | ICD-10-CM

## 2016-04-07 DIAGNOSIS — Z5111 Encounter for antineoplastic chemotherapy: Secondary | ICD-10-CM

## 2016-04-07 LAB — COMPREHENSIVE METABOLIC PANEL
ALT: 77 U/L — ABNORMAL HIGH (ref 14–54)
AST: 40 U/L (ref 15–41)
Albumin: 4.1 g/dL (ref 3.5–5.0)
Alkaline Phosphatase: 91 U/L (ref 38–126)
Anion gap: 8 (ref 5–15)
BUN: 8 mg/dL (ref 6–20)
CO2: 24 mmol/L (ref 22–32)
Calcium: 9.3 mg/dL (ref 8.9–10.3)
Chloride: 105 mmol/L (ref 101–111)
Creatinine, Ser: 0.41 mg/dL — ABNORMAL LOW (ref 0.44–1.00)
GFR calc Af Amer: >60 mL/min (ref >60)
GFR calc non Af Amer: >60 mL/min (ref >60)
Glucose, Bld: 132 mg/dL — ABNORMAL HIGH (ref 65–99)
Potassium: 3.7 mmol/L (ref 3.5–5.1)
Sodium: 137 mmol/L (ref 135–145)
Total Bilirubin: 0.5 mg/dL (ref 0.3–1.2)
Total Protein: 7 g/dL (ref 6.5–8.1)

## 2016-04-07 LAB — CBC WITH DIFFERENTIAL/PLATELET
Basophils Absolute: 0 10*3/uL (ref 0.0–0.1)
Basophils Relative: 1 %
Eosinophils Absolute: 0.1 10*3/uL (ref 0.0–0.7)
Eosinophils Relative: 2 %
HCT: 31.8 % — ABNORMAL LOW (ref 36.0–46.0)
Hemoglobin: 10.8 g/dL — ABNORMAL LOW (ref 12.0–15.0)
Lymphocytes Relative: 16 %
Lymphs Abs: 0.9 10*3/uL (ref 0.7–4.0)
MCH: 30.2 pg (ref 26.0–34.0)
MCHC: 34 g/dL (ref 30.0–36.0)
MCV: 88.8 fL (ref 78.0–100.0)
Monocytes Absolute: 0.5 10*3/uL (ref 0.1–1.0)
Monocytes Relative: 8 %
Neutro Abs: 4.1 10*3/uL (ref 1.7–7.7)
Neutrophils Relative %: 73 %
Platelets: 410 10*3/uL — ABNORMAL HIGH (ref 150–400)
RBC: 3.58 MIL/uL — ABNORMAL LOW (ref 3.87–5.11)
RDW: 16.7 % — ABNORMAL HIGH (ref 11.5–15.5)
WBC: 5.6 10*3/uL (ref 4.0–10.5)

## 2016-04-07 MED ORDER — SODIUM CHLORIDE 0.9% FLUSH
10.0000 mL | INTRAVENOUS | Status: DC | PRN
Start: 2016-04-07 — End: 2016-04-07
  Administered 2016-04-07: 10 mL
  Filled 2016-04-07: qty 10

## 2016-04-07 MED ORDER — DEXAMETHASONE SODIUM PHOSPHATE 100 MG/10ML IJ SOLN
20.0000 mg | Freq: Once | INTRAMUSCULAR | Status: AC
  Administered 2016-04-07: 20 mg via INTRAVENOUS
  Filled 2016-04-07: qty 2

## 2016-04-07 MED ORDER — SODIUM CHLORIDE 0.9 % IV SOLN
Freq: Once | INTRAVENOUS | Status: AC
Start: 2016-04-07 — End: 2016-04-07
  Administered 2016-04-07: 11:00:00 via INTRAVENOUS

## 2016-04-07 MED ORDER — PACLITAXEL CHEMO INJECTION 300 MG/50ML
80.0000 mg/m2 | Freq: Once | INTRAVENOUS | Status: AC
  Administered 2016-04-07: 132 mg via INTRAVENOUS
  Filled 2016-04-07: qty 22

## 2016-04-07 MED ORDER — DIPHENHYDRAMINE HCL 50 MG/ML IJ SOLN
50.0000 mg | Freq: Once | INTRAMUSCULAR | Status: AC
  Administered 2016-04-07: 50 mg via INTRAVENOUS
  Filled 2016-04-07: qty 1

## 2016-04-07 MED ORDER — FAMOTIDINE IN NACL 20-0.9 MG/50ML-% IV SOLN
20.0000 mg | Freq: Once | INTRAVENOUS | Status: AC
  Administered 2016-04-07: 20 mg via INTRAVENOUS
  Filled 2016-04-07: qty 50

## 2016-04-07 MED ORDER — HEPARIN SOD (PORK) LOCK FLUSH 100 UNIT/ML IV SOLN
500.0000 [IU] | Freq: Once | INTRAVENOUS | Status: AC | PRN
  Administered 2016-04-07: 500 [IU]
  Filled 2016-04-07: qty 5

## 2016-04-07 NOTE — Patient Instructions (Signed)
[facility] Discharge Instructions for Patients Receiving Chemotherapy   Beginning January 23rd 2017 lab work for the [facility] will be done in the  Main lab at [facility] on 1st floor. If you have a lab appointment with the [facility] please come in thru the  Main Entrance and check in at the main information desk   Today you received the following chemotherapy agents Taxol. Follow-up as scheduled. Call clinic for any questions or concerns  To help prevent nausea and vomiting after your treatment, we encourage you to take your nausea medication   If you develop nausea and vomiting, or diarrhea that is not controlled by your medication, call the clinic.  The clinic phone number is [phone]. Office hours are Monday-Friday 8:30am-5:00pm.  BELOW ARE SYMPTOMS THAT SHOULD BE REPORTED IMMEDIATELY:  *FEVER GREATER THAN 101.0 F  *CHILLS WITH OR WITHOUT FEVER  NAUSEA AND VOMITING THAT IS NOT CONTROLLED WITH YOUR NAUSEA MEDICATION  *UNUSUAL SHORTNESS OF BREATH  *UNUSUAL BRUISING OR BLEEDING  TENDERNESS IN MOUTH AND THROAT WITH OR WITHOUT PRESENCE OF ULCERS  *URINARY PROBLEMS  *BOWEL PROBLEMS  UNUSUAL RASH Items with * indicate a potential emergency and should be followed up as soon as possible. If you have an emergency after office hours please contact your primary care physician or go to the nearest emergency department.  Please call the clinic during office hours if you have any questions or concerns.   You may also contact the Patient Navigator at [phone] should you have any questions or need assistance in obtaining follow up care.      Resources For Cancer Patients and their Caregivers ? American Cancer Society: Can assist with transportation, wigs, general needs, runs Look Good Feel Better.        3616782832 ? Cancer Care: Provides financial assistance, online support groups, medication/co-pay assistance.  1-800-813-HOPE  361-615-6534) ? [place] Assists [place] cancer patients and their families through emotional , educational and financial support.  (520)030-2224 ? [place] DSS Where to apply for food stamps, Medicaid and utility assistance. 626 265 1604 ? RCATS: Transportation to medical appointments. (678)368-1059 ? Social Security Administration: May apply for disability if have a Stage IV cancer. 972-253-4016 [phone])782-6908 ? [employer], Disability and Transit Services: Assists with nutrition, care and transit needs. [phone]

## 2016-04-07 NOTE — Progress Notes (Signed)
Michele Salas tolerated chemo tx well without complaints. VSS upon discharge. Pt discharged self ambulatory in satisfactory condition with famnily

## 2016-04-14 ENCOUNTER — Encounter (HOSPITAL_COMMUNITY): Payer: Self-pay | Attending: Hematology & Oncology

## 2016-04-14 ENCOUNTER — Encounter (HOSPITAL_COMMUNITY): Payer: Self-pay

## 2016-04-14 VITALS — BP 144/62 | HR 98 | Temp 98.2°F | Resp 18 | Wt 140.4 lb

## 2016-04-14 DIAGNOSIS — Z5111 Encounter for antineoplastic chemotherapy: Secondary | ICD-10-CM

## 2016-04-14 DIAGNOSIS — K219 Gastro-esophageal reflux disease without esophagitis: Secondary | ICD-10-CM | POA: Insufficient documentation

## 2016-04-14 DIAGNOSIS — N644 Mastodynia: Secondary | ICD-10-CM | POA: Insufficient documentation

## 2016-04-14 DIAGNOSIS — C50912 Malignant neoplasm of unspecified site of left female breast: Secondary | ICD-10-CM | POA: Insufficient documentation

## 2016-04-14 DIAGNOSIS — R1013 Epigastric pain: Secondary | ICD-10-CM | POA: Insufficient documentation

## 2016-04-14 DIAGNOSIS — Z79899 Other long term (current) drug therapy: Secondary | ICD-10-CM | POA: Insufficient documentation

## 2016-04-14 DIAGNOSIS — R51 Headache: Secondary | ICD-10-CM | POA: Insufficient documentation

## 2016-04-14 DIAGNOSIS — C50112 Malignant neoplasm of central portion of left female breast: Secondary | ICD-10-CM

## 2016-04-14 LAB — COMPREHENSIVE METABOLIC PANEL
ALT: 81 U/L — ABNORMAL HIGH (ref 14–54)
AST: 43 U/L — ABNORMAL HIGH (ref 15–41)
Albumin: 4.1 g/dL (ref 3.5–5.0)
Alkaline Phosphatase: 91 U/L (ref 38–126)
Anion gap: 1 — ABNORMAL LOW (ref 5–15)
BUN: 10 mg/dL (ref 6–20)
CO2: 25 mmol/L (ref 22–32)
Calcium: 8.7 mg/dL — ABNORMAL LOW (ref 8.9–10.3)
Chloride: 109 mmol/L (ref 101–111)
Creatinine, Ser: 0.45 mg/dL (ref 0.44–1.00)
GFR calc Af Amer: >60 mL/min (ref >60)
GFR calc non Af Amer: >60 mL/min (ref >60)
Glucose, Bld: 132 mg/dL — ABNORMAL HIGH (ref 65–99)
Potassium: 3.7 mmol/L (ref 3.5–5.1)
Sodium: 135 mmol/L (ref 135–145)
Total Bilirubin: 0.6 mg/dL (ref 0.3–1.2)
Total Protein: 7.1 g/dL (ref 6.5–8.1)

## 2016-04-14 LAB — CBC WITH DIFFERENTIAL/PLATELET
Basophils Absolute: 0 10*3/uL (ref 0.0–0.1)
Basophils Relative: 0 %
Eosinophils Absolute: 0.1 10*3/uL (ref 0.0–0.7)
Eosinophils Relative: 2 %
HCT: 32.9 % — ABNORMAL LOW (ref 36.0–46.0)
Hemoglobin: 11.1 g/dL — ABNORMAL LOW (ref 12.0–15.0)
Lymphocytes Relative: 17 %
Lymphs Abs: 1 10*3/uL (ref 0.7–4.0)
MCH: 30.2 pg (ref 26.0–34.0)
MCHC: 33.7 g/dL (ref 30.0–36.0)
MCV: 89.6 fL (ref 78.0–100.0)
Monocytes Absolute: 0.5 10*3/uL (ref 0.1–1.0)
Monocytes Relative: 8 %
Neutro Abs: 4.4 10*3/uL (ref 1.7–7.7)
Neutrophils Relative %: 73 %
Platelets: 389 10*3/uL (ref 150–400)
RBC: 3.67 MIL/uL — ABNORMAL LOW (ref 3.87–5.11)
RDW: 16.6 % — ABNORMAL HIGH (ref 11.5–15.5)
WBC: 6 10*3/uL (ref 4.0–10.5)

## 2016-04-14 MED ORDER — PACLITAXEL CHEMO INJECTION 300 MG/50ML
80.0000 mg/m2 | Freq: Once | INTRAVENOUS | Status: AC
  Administered 2016-04-14: 132 mg via INTRAVENOUS
  Filled 2016-04-14: qty 22

## 2016-04-14 MED ORDER — SODIUM CHLORIDE 0.9 % IV SOLN
20.0000 mg | Freq: Once | INTRAVENOUS | Status: AC
  Administered 2016-04-14: 20 mg via INTRAVENOUS
  Filled 2016-04-14: qty 2

## 2016-04-14 MED ORDER — FAMOTIDINE IN NACL 20-0.9 MG/50ML-% IV SOLN
20.0000 mg | Freq: Once | INTRAVENOUS | Status: AC
  Administered 2016-04-14: 20 mg via INTRAVENOUS

## 2016-04-14 MED ORDER — HEPARIN SOD (PORK) LOCK FLUSH 100 UNIT/ML IV SOLN
500.0000 [IU] | Freq: Once | INTRAVENOUS | Status: AC | PRN
  Administered 2016-04-14: 500 [IU]

## 2016-04-14 MED ORDER — DIPHENHYDRAMINE HCL 50 MG/ML IJ SOLN
50.0000 mg | Freq: Once | INTRAMUSCULAR | Status: AC
  Administered 2016-04-14: 50 mg via INTRAVENOUS

## 2016-04-14 MED ORDER — FAMOTIDINE IN NACL 20-0.9 MG/50ML-% IV SOLN
INTRAVENOUS | Status: AC
  Filled 2016-04-14: qty 50

## 2016-04-14 MED ORDER — SODIUM CHLORIDE 0.9% FLUSH
10.0000 mL | INTRAVENOUS | Status: DC | PRN
Start: 2016-04-14 — End: 2016-04-14

## 2016-04-14 MED ORDER — DIPHENHYDRAMINE HCL 50 MG/ML IJ SOLN
INTRAMUSCULAR | Status: AC
  Filled 2016-04-14: qty 1

## 2016-04-14 MED ORDER — SODIUM CHLORIDE 0.9 % IV SOLN
Freq: Once | INTRAVENOUS | Status: AC
  Administered 2016-04-14: 11:00:00 via INTRAVENOUS

## 2016-04-14 NOTE — Patient Instructions (Signed)
[facility] Discharge Instructions for Patients Receiving Chemotherapy   Beginning January 23rd 2017 lab work for the [facility] will be done in the  Main lab at [facility] on 1st floor. If you have a lab appointment with the [place] please come in thru the  Main Entrance and check in at the main information desk   Today you received the following chemotherapy agents:  Taxol  To help prevent nausea and vomiting after your treatment, we encourage you to take your nausea medication as prescribed.  If you develop nausea and vomiting, or diarrhea that is not controlled by your medication, call the clinic.  The clinic phone number is [phone]. Office hours are Monday-Friday 8:30am-5:00pm.  BELOW ARE SYMPTOMS THAT SHOULD BE REPORTED IMMEDIATELY:  *FEVER GREATER THAN 101.0 F  *CHILLS WITH OR WITHOUT FEVER  NAUSEA AND VOMITING THAT IS NOT CONTROLLED WITH YOUR NAUSEA MEDICATION  *UNUSUAL SHORTNESS OF BREATH  *UNUSUAL BRUISING OR BLEEDING  TENDERNESS IN MOUTH AND THROAT WITH OR WITHOUT PRESENCE OF ULCERS  *URINARY PROBLEMS  *BOWEL PROBLEMS  UNUSUAL RASH Items with * indicate a potential emergency and should be followed up as soon as possible. If you have an emergency after office hours please contact your primary care physician or go to the nearest emergency department.  Please call the clinic during office hours if you have any questions or concerns.   You may also contact the Patient Navigator at [phone] should you have any questions or need assistance in obtaining follow up care.      Resources For Cancer Patients and their Caregivers ? American Cancer Society: Can assist with transportation, wigs, general needs, runs Look Good Feel Better.        612-443-4244 ? Cancer Care: Provides financial assistance, online support groups, medication/co-pay assistance.  1-800-813-HOPE (802)293-5613) ? [place] Assists  [place] cancer patients and their families through emotional , educational and financial support.  9547099552 ? [place] DSS Where to apply for food stamps, Medicaid and utility assistance. 858-429-1484 ? RCATS: Transportation to medical appointments. [phone] ? Social Security Administration: May apply for disability if have a Stage IV cancer. [phone] ? [employer], Disability and Transit Services: Assists with nutrition, care and transit needs. [phone]

## 2016-04-14 NOTE — Progress Notes (Signed)
Dr. Whitney Muse notified of pt's ALT 81.  Okay to tx per MD.  0413:  Tolerated infusion w/o adverse reaction.  Alert, in no distress.  VSS.  Discharged ambulatory.

## 2016-04-18 ENCOUNTER — Other Ambulatory Visit (HOSPITAL_COMMUNITY): Payer: Self-pay | Admitting: *Deleted

## 2016-04-18 DIAGNOSIS — C50112 Malignant neoplasm of central portion of left female breast: Secondary | ICD-10-CM

## 2016-04-18 MED ORDER — LIDOCAINE-PRILOCAINE 2.5-2.5 % EX CREA: TOPICAL_CREAM | CUTANEOUS | 3 refills | Status: DC

## 2016-04-21 ENCOUNTER — Encounter (HOSPITAL_BASED_OUTPATIENT_CLINIC_OR_DEPARTMENT_OTHER): Payer: Self-pay

## 2016-04-21 ENCOUNTER — Ambulatory Visit (HOSPITAL_COMMUNITY): Payer: Self-pay

## 2016-04-21 ENCOUNTER — Encounter (HOSPITAL_COMMUNITY): Payer: Self-pay | Admitting: Oncology

## 2016-04-21 ENCOUNTER — Encounter (HOSPITAL_BASED_OUTPATIENT_CLINIC_OR_DEPARTMENT_OTHER): Payer: Self-pay | Admitting: Oncology

## 2016-04-21 VITALS — BP 117/58 | HR 90 | Temp 98.3°F | Resp 18 | Wt 142.6 lb

## 2016-04-21 DIAGNOSIS — C773 Secondary and unspecified malignant neoplasm of axilla and upper limb lymph nodes: Secondary | ICD-10-CM

## 2016-04-21 DIAGNOSIS — Z23 Encounter for immunization: Secondary | ICD-10-CM

## 2016-04-21 DIAGNOSIS — Z5111 Encounter for antineoplastic chemotherapy: Secondary | ICD-10-CM

## 2016-04-21 DIAGNOSIS — E876 Hypokalemia: Secondary | ICD-10-CM

## 2016-04-21 DIAGNOSIS — C50112 Malignant neoplasm of central portion of left female breast: Secondary | ICD-10-CM

## 2016-04-21 LAB — CBC WITH DIFFERENTIAL/PLATELET
Basophils Absolute: 0 10*3/uL (ref 0.0–0.1)
Basophils Relative: 1 %
Eosinophils Absolute: 0.1 10*3/uL (ref 0.0–0.7)
Eosinophils Relative: 2 %
HCT: 32.4 % — ABNORMAL LOW (ref 36.0–46.0)
Hemoglobin: 11 g/dL — ABNORMAL LOW (ref 12.0–15.0)
Lymphocytes Relative: 19 %
Lymphs Abs: 1.1 10*3/uL (ref 0.7–4.0)
MCH: 30.3 pg (ref 26.0–34.0)
MCHC: 34 g/dL (ref 30.0–36.0)
MCV: 89.3 fL (ref 78.0–100.0)
Monocytes Absolute: 0.5 10*3/uL (ref 0.1–1.0)
Monocytes Relative: 10 %
Neutro Abs: 3.9 10*3/uL (ref 1.7–7.7)
Neutrophils Relative %: 68 %
Platelets: 380 10*3/uL (ref 150–400)
RBC: 3.63 MIL/uL — ABNORMAL LOW (ref 3.87–5.11)
RDW: 16.2 % — ABNORMAL HIGH (ref 11.5–15.5)
WBC: 5.7 10*3/uL (ref 4.0–10.5)

## 2016-04-21 LAB — COMPREHENSIVE METABOLIC PANEL
ALT: 90 U/L — ABNORMAL HIGH (ref 14–54)
AST: 45 U/L — ABNORMAL HIGH (ref 15–41)
Albumin: 4.1 g/dL (ref 3.5–5.0)
Alkaline Phosphatase: 86 U/L (ref 38–126)
Anion gap: 9 (ref 5–15)
BUN: 8 mg/dL (ref 6–20)
CO2: 24 mmol/L (ref 22–32)
Calcium: 9.1 mg/dL (ref 8.9–10.3)
Chloride: 105 mmol/L (ref 101–111)
Creatinine, Ser: 0.43 mg/dL — ABNORMAL LOW (ref 0.44–1.00)
GFR calc Af Amer: >60 mL/min (ref >60)
GFR calc non Af Amer: >60 mL/min (ref >60)
Glucose, Bld: 139 mg/dL — ABNORMAL HIGH (ref 65–99)
Potassium: 3.4 mmol/L — ABNORMAL LOW (ref 3.5–5.1)
Sodium: 138 mmol/L (ref 135–145)
Total Bilirubin: 0.6 mg/dL (ref 0.3–1.2)
Total Protein: 6.9 g/dL (ref 6.5–8.1)

## 2016-04-21 MED ORDER — POTASSIUM CHLORIDE CRYS ER 20 MEQ PO TBCR: 10.0000 meq | EXTENDED_RELEASE_TABLET | Freq: Every day | ORAL | 1 refills | Status: DC

## 2016-04-21 MED ORDER — DIPHENHYDRAMINE HCL 50 MG/ML IJ SOLN
50.0000 mg | Freq: Once | INTRAMUSCULAR | Status: AC
  Administered 2016-04-21: 50 mg via INTRAVENOUS
  Filled 2016-04-21: qty 1

## 2016-04-21 MED ORDER — FAMOTIDINE IN NACL 20-0.9 MG/50ML-% IV SOLN
20.0000 mg | Freq: Once | INTRAVENOUS | Status: AC
  Administered 2016-04-21: 20 mg via INTRAVENOUS
  Filled 2016-04-21: qty 50

## 2016-04-21 MED ORDER — HEPARIN SOD (PORK) LOCK FLUSH 100 UNIT/ML IV SOLN
500.0000 [IU] | Freq: Once | INTRAVENOUS | Status: AC | PRN
  Administered 2016-04-21: 500 [IU]
  Filled 2016-04-21: qty 5

## 2016-04-21 MED ORDER — SODIUM CHLORIDE 0.9 % IV SOLN
Freq: Once | INTRAVENOUS | Status: AC
  Administered 2016-04-21: 11:00:00 via INTRAVENOUS

## 2016-04-21 MED ORDER — SODIUM CHLORIDE 0.9 % IV SOLN
20.0000 mg | Freq: Once | INTRAVENOUS | Status: AC
  Administered 2016-04-21: 20 mg via INTRAVENOUS
  Filled 2016-04-21: qty 2

## 2016-04-21 MED ORDER — INFLUENZA VAC SPLIT QUAD 0.5 ML IM SUSY
0.5000 mL | PREFILLED_SYRINGE | Freq: Once | INTRAMUSCULAR | Status: AC
  Administered 2016-04-21: 0.5 mL via INTRAMUSCULAR
  Filled 2016-04-21: qty 0.5

## 2016-04-21 MED ORDER — SODIUM CHLORIDE 0.9% FLUSH
10.0000 mL | INTRAVENOUS | Status: DC | PRN
  Administered 2016-04-21: 10 mL
  Filled 2016-04-21: qty 10

## 2016-04-21 MED ORDER — POTASSIUM CHLORIDE CRYS ER 10 MEQ PO TBCR
10.0000 meq | EXTENDED_RELEASE_TABLET | Freq: Once | ORAL | Status: AC
  Administered 2016-04-21: 10 meq via ORAL
  Filled 2016-04-21: qty 1

## 2016-04-21 MED ORDER — PACLITAXEL CHEMO INJECTION 300 MG/50ML
80.0000 mg/m2 | Freq: Once | INTRAVENOUS | Status: AC
  Administered 2016-04-21: 132 mg via INTRAVENOUS
  Filled 2016-04-21: qty 22

## 2016-04-21 NOTE — Progress Notes (Signed)
[name], PA-C [place] [address]  Cancer of central portion of left female breast [facility])  CURRENT THERAPY: Paclitaxel weekly.  INTERVAL HISTORY: Michele Salas 35 y.o. 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 Arnoldo Morale on 12/09/2015.  Now on systemic adjuvant chemotherapy.    Cancer of central portion of left female breast ([place])   09/16/2015 Mammogram    1.6 cm retroareolar left breast mass measuring 1.6 cm in largest diameter in addition to 3 abnormal appearing left inferior axillary lymph nodes.      09/23/2015 Initial Biopsy    US guided core biopsy of left breast mass and suspicious left axillary lymph nodes.      09/23/2015 Pathology Results    Invasive ductal carcinoma, high grade.  Left axilla lymph node is positive for metastatic carcinoma.  ER/PR NEGATIVE, HER2 NEGATIVE, KI-67 marker of 85%.      10/26/2015 Imaging    Bone scan- No evidence of metastatic disease to the skeleton.      10/27/2015 Imaging    CT CAP-  Retroareolar mass with 2 asymmetrically prominent left axillary lymph nodes and an upper normal size left subpectoral lymph node. No other findings of metastatic disease in the chest, abdomen, or pelvis.      11/05/2015 Echocardiogram    The estimated ejection fraction was in the range of 60% to 65%.      11/23/2015 Miscellaneous    Genetic Counseling- genetic test result was negative for any known pathogenic mutations within any of 28 genes that would cause her to be at an increased genetic risk for breast, ovarian, or other related cancers.      12/09/2015 Surgery    L radical mastectomy, axillary LN dissection, port a cath insertion with Dr. Aviva Signs 12/09/2015      12/11/2015 Pathology Results    - INVASIVE GRADE III DUCTAL CARCINOMA, SPANNING 2.3 CM IN GREATEST DIMENSION. -  ASSOCIATED HIGH GRADE DCIS - MARGINS ARE NEGATIVE. 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 - EIGHT ADDITIONAL LYMPH NODES WITH NO TUMOR SEEN      12/11/2015 Pathology Results    HER2 NEGATIVE, ER 0%, PR 0%, Ki-67 marker 90%      12/25/2015 - 02/12/2016 Chemotherapy    AC every 2 weeks x 4 cycles      02/04/2016 Treatment Plan Change    Treatment deferred x 7 days due to neutropenia      02/25/2016 -  Chemotherapy    Paclitaxel weekly x 12       She is tolerating therapy well. She does note some mild lower extremity discomfort secondary to paclitaxel. She notes that it is only mild and utilizes over-the-counter medication for management.  She denies any active complaints today.  Review of Systems  Constitutional: Negative.  Negative for chills, fever and weight loss.  HENT: Negative.   Eyes: Negative.  Negative for blurred vision.  Respiratory: Negative.  Negative for cough.   Cardiovascular: Negative.  Negative for chest pain.  Gastrointestinal: Negative.  Negative for constipation, diarrhea, nausea and vomiting.  Genitourinary: Negative.   Musculoskeletal: Negative.   Skin: Negative.   Neurological: Negative.  Negative for weakness.  Endo/Heme/Allergies: Negative.   Psychiatric/Behavioral: Negative.     Past Medical History:  Diagnosis Date  . Breast cancer, left (  [place]) 10/22/2015  . GERD (gastroesophageal reflux disease)   . Iron deficiency anemia 10/27/2015    Past Surgical History:  Procedure Laterality Date  . MASTECTOMY MODIFIED RADICAL Left 12/09/2015   Procedure: LEFT MODIFIED RADICAL MASTECTOMY;  Surgeon: Aviva Signs, MD;  Location: [facility];  Service: General;  Laterality: Left;  . PORTACATH PLACEMENT Right 12/09/2015   Procedure: INSERTION PORT-A-CATH RIGHT SUBCLAVIAN;  Surgeon: Aviva Signs, MD;  Location: [facility];  Service: General;  Laterality: Right;    Family History  Problem Relation Age of Onset  . Diabetes Mother   . Uterine  cancer Paternal Aunt 53  . Heart attack Paternal Uncle     unspecified age  . Heart attack Paternal Grandfather 32  . Other Paternal Uncle     stomach swollen with fluid that had to be removed; lim info    Social History   Social History  . Marital status: Married    Spouse name: N/A  . Number of children: N/A  . Years of education: N/A   Social History Main Topics  . Smoking status: Passive Smoke Exposure - Never Smoker  . Smokeless tobacco: Never Used  . Alcohol use No  . Drug use: No  . Sexual activity: Yes    Birth control/ protection: None   Other Topics Concern  . None   Social History Narrative  . None     PHYSICAL EXAMINATION  ECOG PERFORMANCE STATUS: 0 - Asymptomatic  There were no vitals filed for this visit.  Blood pressure 130/72 Pulse 93 Respirations 18 Temperature 90.8 Oximetry 100%  GENERAL:alert, no distress, well nourished, well developed, comfortable, cooperative, smiling and no interpreter on site, but accompanied by former cancer patient who participates in interpretation. SKIN: skin color, texture, turgor are normal, no rashes or significant lesions HEAD: Normocephalic, No masses, lesions, tenderness or abnormalities EYES: normal, EOMI, Conjunctiva are pink and non-injected EARS: External ears normal OROPHARYNX:lips, buccal mucosa, and tongue normal and mucous membranes are moist  NECK: supple, trachea midline LYMPH:  not examined BREAST:not examined LUNGS: clear to auscultation bilaterally without wheezes rales or rhonchi HEART: regular rate & rhythm, no murmurs, no gallops, S1 normal and S2 normal ABDOMEN:abdomen soft, non-tender and normal bowel sounds BACK: Back symmetric, no curvature. EXTREMITIES:less then 2 second capillary refill, no joint deformities, effusion, or inflammation, no skin discoloration, no cyanosis  NEURO: alert & oriented x 3 with fluent speech, no focal motor/sensory deficits, gait normal   LABORATORY  DATA: CBC    Component Value Date/Time   WBC 5.7 04/21/2016 0958   RBC 3.63 (L) 04/21/2016 0958   HGB 11.0 (L) 04/21/2016 0958   HCT 32.4 (L) 04/21/2016 0958   PLT 380 04/21/2016 0958   MCV 89.3 04/21/2016 0958   MCH 30.3 04/21/2016 0958   MCHC 34.0 04/21/2016 0958   RDW 16.2 (H) 04/21/2016 0958   LYMPHSABS 1.1 04/21/2016 0958   MONOABS 0.5 04/21/2016 0958   EOSABS 0.1 04/21/2016 0958   BASOSABS 0.0 04/21/2016 0958      Chemistry      Component Value Date/Time   NA 138 04/21/2016 0958   K 3.4 (L) 04/21/2016 0958   CL 105 04/21/2016 0958   CO2 24 04/21/2016 0958   BUN 8 04/21/2016 0958   CREATININE 0.43 (L) 04/21/2016 0958      Component Value Date/Time   CALCIUM 9.1 04/21/2016 0958   ALKPHOS 86 04/21/2016 0958   AST 45 (H) 04/21/2016 0958   ALT 90 (H) 04/21/2016 3559  BILITOT 0.6 04/21/2016 0958        PENDING LABS:   RADIOGRAPHIC STUDIES:  No results found.   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 Arnoldo Morale on 12/09/2015.  Now on systemic adjuvant chemotherapy.  Oncology history is up to date.  Labs today: CBC diff, CMET.  I personally reviewed and went over laboratory results with the patient.  The results are noted within this dictation.  Labs satisfy treatment parameters today.  Mild HYPOKALEMIA is noted.  Kdur 10 mEq daily is escribed.  I will refer the patient to XRT in [place] (uninsured and undocumented).  She will complete adjuvant chemotherapy in ~ 3 weeks and I would like her to be seen by Rad Onc prior to completion of therapy for consultation.  Continue weekly treatment as planned and return in 2 weeks for follow-up.   ORDERS PLACED FOR THIS ENCOUNTER: No orders of the defined types were placed in this encounter.   MEDICATIONS PRESCRIBED  THIS ENCOUNTER: No orders of the defined types were placed in this encounter.   THERAPY PLAN:  Continue with therapy as planned.  Will refer to radiation oncology for adjuvant XRT.  All questions were answered. The patient knows to call the clinic with any problems, questions or concerns. We can certainly see the patient much sooner if necessary.  Patient and plan discussed with Dr. Ancil Linsey and she is in agreement with the aforementioned.   This note is electronically signed by: Doy Mince 04/21/2016 10:32 PM

## 2016-04-21 NOTE — Assessment & Plan Note (Addendum)
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 Arnoldo Morale on 12/09/2015.  Now on systemic adjuvant chemotherapy.  Oncology history is up to date.  Labs today: CBC diff, CMET.  I personally reviewed and went over laboratory results with the patient.  The results are noted within this dictation.  Labs satisfy treatment parameters today.  Mild HYPOKALEMIA is noted.  Kdur 10 mEq daily is escribed.  I will refer the patient to XRT in [place] (uninsured and undocumented).  She will complete adjuvant chemotherapy in ~ 3 weeks and I would like her to be seen by Rad Onc prior to completion of therapy for consultation.  Continue weekly treatment as planned and return in 2 weeks for follow-up.

## 2016-04-21 NOTE — Progress Notes (Signed)
Chemotherapy given today per orders. Patient tolerated well without problems. Vitals stable, discharged from clinic ambulatory

## 2016-04-21 NOTE — Patient Instructions (Signed)
[place] at [facility] Discharge Instructions  RECOMMENDATIONS MADE BY THE CONSULTANT AND ANY TEST RESULTS WILL BE SENT TO YOUR REFERRING PHYSICIAN.  You were seen by Gershon Mussel today Treatment in 1 week Follow up and treatment in 2 weeks   Thank you for choosing [place] at [facility] to provide your oncology and hematology care.  To afford each patient quality time with our provider, please arrive at least 15 minutes before your scheduled appointment time.   Beginning January 23rd 2017 lab work for the [facility] will be done in the  Main lab at [facility] on 1st floor. If you have a lab appointment with the [place] please come in thru the  Main Entrance and check in at the main information desk  You need to re-schedule your appointment should you arrive 10 or more minutes late.  We strive to give you quality time with our providers, and arriving late affects you and other patients whose appointments are after yours.  Also, if you no show three or more times for appointments you may be dismissed from the clinic at the providers discretion.     Again, thank you for choosing [facility]  Our hope is that these requests will decrease the amount of time that you wait before being seen by our physicians.       _____________________________________________________________  Should you have questions after your visit to [facility], please contact our office at [phone] between the hours of 8:30 a.m. and 4:30 p.m.  Voicemails left after 4:30 p.m. will not be returned until the following business day.  For prescription refill requests, have your pharmacy contact our office.         Resources For Cancer Patients and their Caregivers ? American Cancer Society: Can assist with transportation, wigs, general needs, runs Look Good Feel Better.        [phone] ? Cancer Care: Provides financial assistance,  online support groups, medication/co-pay assistance.  1-800-813-HOPE 718-541-6005) ? [place] Assists [place] cancer patients and their families through emotional , educational and financial support.  [phone] ? [place] DSS Where to apply for food stamps, Medicaid and utility assistance. 262-635-4206 ? RCATS: Transportation to medical appointments. [phone] ? Social Security Administration: May apply for disability if have a Stage IV cancer. [phone] [phone] ? [employer], Disability and Transit Services: Assists with nutrition, care and transit needs. [place] Support Programs: @10RELATIVEDAYS @ > Cancer Support Group  2nd Tuesday of the month 1pm-2pm, Journey Room  > Creative Journey  3rd Tuesday of the month 1130am-1pm, Journey Room  > Look Good Feel Better  1st Wednesday of the month 10am-12 noon, Journey Room (Call [place] to register [phone])

## 2016-04-21 NOTE — Patient Instructions (Signed)
[facility] Discharge Instructions for Patients Receiving Chemotherapy   Beginning January 23rd 2017 lab work for the [facility] will be done in the  Main lab at [facility] on 1st floor. If you have a lab appointment with the [place] please come in thru the  Main Entrance and check in at the main information desk   Today you received the following chemotherapy agents Taxol  To help prevent nausea and vomiting after your treatment, we encourage you to take your nausea medication    Take 1/2 tablet of potassium each day.    If you develop nausea and vomiting, or diarrhea that is not controlled by your medication, call the clinic.  The clinic phone number is [phone]. Office hours are Monday-Friday 8:30am-5:00pm.  BELOW ARE SYMPTOMS THAT SHOULD BE REPORTED IMMEDIATELY:  *FEVER GREATER THAN 101.0 F  *CHILLS WITH OR WITHOUT FEVER  NAUSEA AND VOMITING THAT IS NOT CONTROLLED WITH YOUR NAUSEA MEDICATION  *UNUSUAL SHORTNESS OF BREATH  *UNUSUAL BRUISING OR BLEEDING  TENDERNESS IN MOUTH AND THROAT WITH OR WITHOUT PRESENCE OF ULCERS  *URINARY PROBLEMS  *BOWEL PROBLEMS  UNUSUAL RASH Items with * indicate a potential emergency and should be followed up as soon as possible. If you have an emergency after office hours please contact your primary care physician or go to the nearest emergency department.  Please call the clinic during office hours if you have any questions or concerns.   You may also contact the Patient Navigator at [phone] should you have any questions or need assistance in obtaining follow up care.      Resources For Cancer Patients and their Caregivers ? American Cancer Society: Can assist with transportation, wigs, general needs, runs Look Good Feel Better.        [phone] ? Cancer Care: Provides financial assistance, online support groups, medication/co-pay assistance.  1-800-813-HOPE 714-563-1972) ? [place] Assists [place] cancer patients and their families through emotional , educational and financial support.  7152566417 ? [place] DSS Where to apply for food stamps, Medicaid and utility assistance. [phone] ? RCATS: Transportation to medical appointments. [phone] ? Social Security Administration: May apply for disability if have a Stage IV cancer. [phone] ? [employer], Disability and Transit Services: Assists with nutrition, care and transit needs. [phone]

## 2016-04-28 ENCOUNTER — Encounter (HOSPITAL_COMMUNITY): Payer: Self-pay

## 2016-04-28 ENCOUNTER — Ambulatory Visit (HOSPITAL_COMMUNITY): Payer: Self-pay

## 2016-04-28 ENCOUNTER — Encounter (HOSPITAL_BASED_OUTPATIENT_CLINIC_OR_DEPARTMENT_OTHER): Payer: Self-pay

## 2016-04-28 VITALS — BP 129/78 | HR 93 | Temp 98.4°F | Resp 18 | Wt 143.0 lb

## 2016-04-28 DIAGNOSIS — C50112 Malignant neoplasm of central portion of left female breast: Secondary | ICD-10-CM

## 2016-04-28 DIAGNOSIS — C773 Secondary and unspecified malignant neoplasm of axilla and upper limb lymph nodes: Secondary | ICD-10-CM

## 2016-04-28 DIAGNOSIS — Z5111 Encounter for antineoplastic chemotherapy: Secondary | ICD-10-CM

## 2016-04-28 LAB — CBC WITH DIFFERENTIAL/PLATELET
Basophils Absolute: 0 10*3/uL (ref 0.0–0.1)
Basophils Relative: 1 %
Eosinophils Absolute: 0.1 10*3/uL (ref 0.0–0.7)
Eosinophils Relative: 1 %
HCT: 33.5 % — ABNORMAL LOW (ref 36.0–46.0)
Hemoglobin: 11.2 g/dL — ABNORMAL LOW (ref 12.0–15.0)
Lymphocytes Relative: 19 %
Lymphs Abs: 1 10*3/uL (ref 0.7–4.0)
MCH: 29.9 pg (ref 26.0–34.0)
MCHC: 33.4 g/dL (ref 30.0–36.0)
MCV: 89.3 fL (ref 78.0–100.0)
Monocytes Absolute: 0.4 10*3/uL (ref 0.1–1.0)
Monocytes Relative: 7 %
Neutro Abs: 3.9 10*3/uL (ref 1.7–7.7)
Neutrophils Relative %: 72 %
Platelets: 382 10*3/uL (ref 150–400)
RBC: 3.75 MIL/uL — ABNORMAL LOW (ref 3.87–5.11)
RDW: 16 % — ABNORMAL HIGH (ref 11.5–15.5)
WBC: 5.3 10*3/uL (ref 4.0–10.5)

## 2016-04-28 LAB — COMPREHENSIVE METABOLIC PANEL
ALT: 97 U/L — ABNORMAL HIGH (ref 14–54)
AST: 53 U/L — ABNORMAL HIGH (ref 15–41)
Albumin: 4.1 g/dL (ref 3.5–5.0)
Alkaline Phosphatase: 77 U/L (ref 38–126)
Anion gap: 7 (ref 5–15)
BUN: 7 mg/dL (ref 6–20)
CO2: 24 mmol/L (ref 22–32)
Calcium: 9.1 mg/dL (ref 8.9–10.3)
Chloride: 106 mmol/L (ref 101–111)
Creatinine, Ser: 0.5 mg/dL (ref 0.44–1.00)
GFR calc Af Amer: >60 mL/min (ref >60)
GFR calc non Af Amer: >60 mL/min (ref >60)
Glucose, Bld: 132 mg/dL — ABNORMAL HIGH (ref 65–99)
Potassium: 3.7 mmol/L (ref 3.5–5.1)
Sodium: 137 mmol/L (ref 135–145)
Total Bilirubin: 0.5 mg/dL (ref 0.3–1.2)
Total Protein: 7 g/dL (ref 6.5–8.1)

## 2016-04-28 MED ORDER — PACLITAXEL CHEMO INJECTION 300 MG/50ML
80.0000 mg/m2 | Freq: Once | INTRAVENOUS | Status: AC
  Administered 2016-04-28: 132 mg via INTRAVENOUS
  Filled 2016-04-28: qty 22

## 2016-04-28 MED ORDER — FAMOTIDINE IN NACL 20-0.9 MG/50ML-% IV SOLN
20.0000 mg | Freq: Once | INTRAVENOUS | Status: AC
  Administered 2016-04-28: 20 mg via INTRAVENOUS

## 2016-04-28 MED ORDER — SODIUM CHLORIDE 0.9 % IV SOLN
20.0000 mg | Freq: Once | INTRAVENOUS | Status: AC
  Administered 2016-04-28: 20 mg via INTRAVENOUS
  Filled 2016-04-28: qty 2

## 2016-04-28 MED ORDER — DIPHENHYDRAMINE HCL 50 MG/ML IJ SOLN
INTRAMUSCULAR | Status: AC
  Filled 2016-04-28: qty 1

## 2016-04-28 MED ORDER — HEPARIN SOD (PORK) LOCK FLUSH 100 UNIT/ML IV SOLN
500.0000 [IU] | Freq: Once | INTRAVENOUS | Status: AC | PRN
  Administered 2016-04-28: 500 [IU]
  Filled 2016-04-28: qty 5

## 2016-04-28 MED ORDER — SODIUM CHLORIDE 0.9 % IV SOLN
Freq: Once | INTRAVENOUS | Status: AC
  Administered 2016-04-28: 11:00:00 via INTRAVENOUS

## 2016-04-28 MED ORDER — SODIUM CHLORIDE 0.9% FLUSH
10.0000 mL | INTRAVENOUS | Status: DC | PRN
  Administered 2016-04-28: 10 mL
  Filled 2016-04-28: qty 10

## 2016-04-28 MED ORDER — FAMOTIDINE IN NACL 20-0.9 MG/50ML-% IV SOLN
INTRAVENOUS | Status: AC
Start: 2016-04-28 — End: 2016-04-28
  Filled 2016-04-28: qty 50

## 2016-04-28 MED ORDER — DIPHENHYDRAMINE HCL 50 MG/ML IJ SOLN
50.0000 mg | Freq: Once | INTRAMUSCULAR | Status: AC
  Administered 2016-04-28: 50 mg via INTRAVENOUS

## 2016-04-28 NOTE — Progress Notes (Signed)
Labs reviewed with MD prior to releasing chemo. Proceed as planned.  Patient tolerated chemotherapy well today. No problems. Vitals stable. Discharged ambulatory with friend from clinic.

## 2016-04-28 NOTE — Patient Instructions (Signed)
[facility] Discharge Instructions for Patients Receiving Chemotherapy   Beginning January 23rd 2017 lab work for the [facility] will be done in the  Main lab at [facility] on 1st floor. If you have a lab appointment with the [place] please come in thru the  Main Entrance and check in at the main information desk   Today you received the following chemotherapy agents Taxol  To help prevent nausea and vomiting after your treatment, we encourage you to take your nausea medication      If you develop nausea and vomiting, or diarrhea that is not controlled by your medication, call the clinic.  The clinic phone number is [phone]. Office hours are Monday-Friday 8:30am-5:00pm.  BELOW ARE SYMPTOMS THAT SHOULD BE REPORTED IMMEDIATELY:  *FEVER GREATER THAN 101.0 F  *CHILLS WITH OR WITHOUT FEVER  NAUSEA AND VOMITING THAT IS NOT CONTROLLED WITH YOUR NAUSEA MEDICATION  *UNUSUAL SHORTNESS OF BREATH  *UNUSUAL BRUISING OR BLEEDING  TENDERNESS IN MOUTH AND THROAT WITH OR WITHOUT PRESENCE OF ULCERS  *URINARY PROBLEMS  *BOWEL PROBLEMS  UNUSUAL RASH Items with * indicate a potential emergency and should be followed up as soon as possible. If you have an emergency after office hours please contact your primary care physician or go to the nearest emergency department.  Please call the clinic during office hours if you have any questions or concerns.   You may also contact the Patient Navigator at [phone] should you have any questions or need assistance in obtaining follow up care.      Resources For Cancer Patients and their Caregivers ? American Cancer Society: Can assist with transportation, wigs, general needs, runs Look Good Feel Better.        [phone] ? Cancer Care: Provides financial assistance, online support groups, medication/co-pay assistance.  1-800-813-HOPE [phone]) ? [place] Assists [place]  cancer patients and their families through emotional , educational and financial support.  2236026894 ? [place] DSS Where to apply for food stamps, Medicaid and utility assistance. 347-414-9497 ? RCATS: Transportation to medical appointments. [phone] ? Social Security Administration: May apply for disability if have a Stage IV cancer. [phone] ? [employer], Disability and Transit Services: Assists with nutrition, care and transit needs. [phone]

## 2016-05-05 ENCOUNTER — Encounter (HOSPITAL_COMMUNITY): Payer: Self-pay | Admitting: Oncology

## 2016-05-05 ENCOUNTER — Encounter (HOSPITAL_BASED_OUTPATIENT_CLINIC_OR_DEPARTMENT_OTHER): Payer: Self-pay

## 2016-05-05 ENCOUNTER — Encounter (HOSPITAL_BASED_OUTPATIENT_CLINIC_OR_DEPARTMENT_OTHER): Payer: Self-pay | Admitting: Oncology

## 2016-05-05 VITALS — BP 130/79 | HR 104 | Temp 98.6°F | Resp 18 | Wt 144.6 lb

## 2016-05-05 DIAGNOSIS — C773 Secondary and unspecified malignant neoplasm of axilla and upper limb lymph nodes: Secondary | ICD-10-CM

## 2016-05-05 DIAGNOSIS — Z5111 Encounter for antineoplastic chemotherapy: Secondary | ICD-10-CM

## 2016-05-05 DIAGNOSIS — C50112 Malignant neoplasm of central portion of left female breast: Secondary | ICD-10-CM

## 2016-05-05 LAB — CBC WITH DIFFERENTIAL/PLATELET
Basophils Absolute: 0 10*3/uL (ref 0.0–0.1)
Basophils Relative: 0 %
Eosinophils Absolute: 0.1 10*3/uL (ref 0.0–0.7)
Eosinophils Relative: 1 %
HCT: 33.7 % — ABNORMAL LOW (ref 36.0–46.0)
Hemoglobin: 11.2 g/dL — ABNORMAL LOW (ref 12.0–15.0)
Lymphocytes Relative: 22 %
Lymphs Abs: 1.1 10*3/uL (ref 0.7–4.0)
MCH: 29.5 pg (ref 26.0–34.0)
MCHC: 33.2 g/dL (ref 30.0–36.0)
MCV: 88.7 fL (ref 78.0–100.0)
Monocytes Absolute: 0.5 10*3/uL (ref 0.1–1.0)
Monocytes Relative: 9 %
Neutro Abs: 3.3 10*3/uL (ref 1.7–7.7)
Neutrophils Relative %: 68 %
Platelets: 371 10*3/uL (ref 150–400)
RBC: 3.8 MIL/uL — ABNORMAL LOW (ref 3.87–5.11)
RDW: 15.6 % — ABNORMAL HIGH (ref 11.5–15.5)
WBC: 5 10*3/uL (ref 4.0–10.5)

## 2016-05-05 LAB — COMPREHENSIVE METABOLIC PANEL
ALT: 107 U/L — ABNORMAL HIGH (ref 14–54)
AST: 56 U/L — ABNORMAL HIGH (ref 15–41)
Albumin: 4 g/dL (ref 3.5–5.0)
Alkaline Phosphatase: 77 U/L (ref 38–126)
Anion gap: 7 (ref 5–15)
BUN: 8 mg/dL (ref 6–20)
CO2: 25 mmol/L (ref 22–32)
Calcium: 9.2 mg/dL (ref 8.9–10.3)
Chloride: 106 mmol/L (ref 101–111)
Creatinine, Ser: 0.43 mg/dL — ABNORMAL LOW (ref 0.44–1.00)
GFR calc Af Amer: >60 mL/min (ref >60)
GFR calc non Af Amer: >60 mL/min (ref >60)
Glucose, Bld: 138 mg/dL — ABNORMAL HIGH (ref 65–99)
Potassium: 3.6 mmol/L (ref 3.5–5.1)
Sodium: 138 mmol/L (ref 135–145)
Total Bilirubin: 0.6 mg/dL (ref 0.3–1.2)
Total Protein: 6.9 g/dL (ref 6.5–8.1)

## 2016-05-05 MED ORDER — SODIUM CHLORIDE 0.9 % IV SOLN
Freq: Once | INTRAVENOUS | Status: AC
  Administered 2016-05-05: 10:00:00 via INTRAVENOUS

## 2016-05-05 MED ORDER — SODIUM CHLORIDE 0.9% FLUSH: 10.0000 mL | INTRAVENOUS | Status: DC | PRN

## 2016-05-05 MED ORDER — SODIUM CHLORIDE 0.9 % IV SOLN
20.0000 mg | Freq: Once | INTRAVENOUS | Status: AC
  Administered 2016-05-05: 20 mg via INTRAVENOUS
  Filled 2016-05-05: qty 2

## 2016-05-05 MED ORDER — DIPHENHYDRAMINE HCL 50 MG/ML IJ SOLN
50.0000 mg | Freq: Once | INTRAMUSCULAR | Status: AC
  Administered 2016-05-05: 50 mg via INTRAVENOUS
  Filled 2016-05-05: qty 1

## 2016-05-05 MED ORDER — FAMOTIDINE IN NACL 20-0.9 MG/50ML-% IV SOLN
20.0000 mg | Freq: Once | INTRAVENOUS | Status: AC
  Administered 2016-05-05: 20 mg via INTRAVENOUS
  Filled 2016-05-05: qty 50

## 2016-05-05 MED ORDER — HEPARIN SOD (PORK) LOCK FLUSH 100 UNIT/ML IV SOLN
500.0000 [IU] | Freq: Once | INTRAVENOUS | Status: AC | PRN
  Administered 2016-05-05: 500 [IU]
  Filled 2016-05-05: qty 5

## 2016-05-05 MED ORDER — PACLITAXEL CHEMO INJECTION 300 MG/50ML
80.0000 mg/m2 | Freq: Once | INTRAVENOUS | Status: AC
  Administered 2016-05-05: 132 mg via INTRAVENOUS
  Filled 2016-05-05: qty 22

## 2016-05-05 NOTE — Assessment & Plan Note (Signed)
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 Arnoldo Morale on 12/09/2015.  Now on systemic adjuvant chemotherapy.  Oncology history is up to date.  Labs today: CBC diff, CMET.  I personally reviewed and went over laboratory results with the patient.  The results are noted within this dictation.  Labs satisfy treatment parameters today.    She has been referred to XRT in [place].  She has a consultation appointment with Dr. Lisbeth Renshaw on 05/16/2016.  Of note, she is uninsured and undocumented.    Final treatment is next week for cycle #12 of Taxol.    Return in 4 weeks for follow-up.

## 2016-05-05 NOTE — Patient Instructions (Signed)
[facility] Discharge Instructions for Patients Receiving Chemotherapy   Beginning January 23rd 2017 lab work for the [facility] will be done in the  Main lab at [facility] on 1st floor. If you have a lab appointment with the [place] please come in thru the  Main Entrance and check in at the main information desk   Today you received the following chemotherapy agents:  Taxol  If you develop nausea and vomiting, or diarrhea that is not controlled by your medication, call the clinic.  The clinic phone number is [phone]. Office hours are Monday-Friday 8:30am-5:00pm.  BELOW ARE SYMPTOMS THAT SHOULD BE REPORTED IMMEDIATELY:  *FEVER GREATER THAN 101.0 F  *CHILLS WITH OR WITHOUT FEVER  NAUSEA AND VOMITING THAT IS NOT CONTROLLED WITH YOUR NAUSEA MEDICATION  *UNUSUAL SHORTNESS OF BREATH  *UNUSUAL BRUISING OR BLEEDING  TENDERNESS IN MOUTH AND THROAT WITH OR WITHOUT PRESENCE OF ULCERS  *URINARY PROBLEMS  *BOWEL PROBLEMS  UNUSUAL RASH Items with * indicate a potential emergency and should be followed up as soon as possible. If you have an emergency after office hours please contact your primary care physician or go to the nearest emergency department.  Please call the clinic during office hours if you have any questions or concerns.   You may also contact the Patient Navigator at [phone] should you have any questions or need assistance in obtaining follow up care.      Resources For Cancer Patients and their Caregivers ? American Cancer Society: Can assist with transportation, wigs, general needs, runs Look Good Feel Better.        410-724-0628 ? Cancer Care: Provides financial assistance, online support groups, medication/co-pay assistance.  1-800-813-HOPE (405)455-4664) ? [place] Assists [place] cancer patients and their families through emotional , educational and financial support.  641-462-4674 ? [place] DSS Where to apply for food stamps, Medicaid and utility assistance. 425-469-4857 ? RCATS: Transportation to medical appointments. 218-666-1445 ? Social Security Administration: May apply for disability if have a Stage IV cancer. [phone] ? [employer], Disability and Transit Services: Assists with nutrition, care and transit needs. [phone]

## 2016-05-05 NOTE — Progress Notes (Signed)
[name], PA-C [place] [address]  Cancer of central portion of left female breast [facility])  CURRENT THERAPY: Paclitaxel weekly.  INTERVAL HISTORY: Michele Salas 35 y.o. 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 Arnoldo Morale on 12/09/2015.  Now on systemic adjuvant chemotherapy.    Cancer of central portion of left female breast ([place])   09/16/2015 Mammogram    1.6 cm retroareolar left breast mass measuring 1.6 cm in largest diameter in addition to 3 abnormal appearing left inferior axillary lymph nodes.      09/23/2015 Initial Biopsy    US guided core biopsy of left breast mass and suspicious left axillary lymph nodes.      09/23/2015 Pathology Results    Invasive ductal carcinoma, high grade.  Left axilla lymph node is positive for metastatic carcinoma.  ER/PR NEGATIVE, HER2 NEGATIVE, KI-67 marker of 85%.      10/26/2015 Imaging    Bone scan- No evidence of metastatic disease to the skeleton.      10/27/2015 Imaging    CT CAP-  Retroareolar mass with 2 asymmetrically prominent left axillary lymph nodes and an upper normal size left subpectoral lymph node. No other findings of metastatic disease in the chest, abdomen, or pelvis.      11/05/2015 Echocardiogram    The estimated ejection fraction was in the range of 60% to 65%.      11/23/2015 Miscellaneous    Genetic Counseling- genetic test result was negative for any known pathogenic mutations within any of 28 genes that would cause her to be at an increased genetic risk for breast, ovarian, or other related cancers.      12/09/2015 Surgery    L radical mastectomy, axillary LN dissection, port a cath insertion with Dr. Aviva Signs 12/09/2015      12/11/2015 Pathology Results    - INVASIVE GRADE III DUCTAL CARCINOMA, SPANNING 2.3 CM IN GREATEST DIMENSION. -  ASSOCIATED HIGH GRADE DCIS - MARGINS ARE NEGATIVE. 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 - EIGHT ADDITIONAL LYMPH NODES WITH NO TUMOR SEEN      12/11/2015 Pathology Results    HER2 NEGATIVE, ER 0%, PR 0%, Ki-67 marker 90%      12/25/2015 - 02/12/2016 Chemotherapy    AC every 2 weeks x 4 cycles      02/04/2016 Treatment Plan Change    Treatment deferred x 7 days due to neutropenia      02/25/2016 -  Chemotherapy    Paclitaxel weekly x 12      She denies any complaints.  She is tolerating treatment well.  Review of Systems  Constitutional: Negative.  Negative for chills, fever and weight loss.  HENT: Negative.   Eyes: Negative.   Respiratory: Negative.  Negative for cough and shortness of breath.   Cardiovascular: Negative.  Negative for chest pain and leg swelling.  Gastrointestinal: Negative.  Negative for constipation, diarrhea, nausea and vomiting.  Genitourinary: Negative.   Musculoskeletal: Negative for joint pain and myalgias.  Skin: Negative.   Neurological: Negative.  Negative for tingling.  Endo/Heme/Allergies: Negative.   Psychiatric/Behavioral: Negative.     Past Medical History:  Diagnosis Date  . Breast cancer, left ([place]) 10/22/2015  . GERD (gastroesophageal reflux disease)   . Iron deficiency anemia 10/27/2015    Past Surgical  History:  Procedure Laterality Date  . MASTECTOMY MODIFIED RADICAL Left 12/09/2015   Procedure: LEFT MODIFIED RADICAL MASTECTOMY;  Surgeon: Aviva Signs, MD;  Location: [facility];  Service: General;  Laterality: Left;  . PORTACATH PLACEMENT Right 12/09/2015   Procedure: INSERTION PORT-A-CATH RIGHT SUBCLAVIAN;  Surgeon: Aviva Signs, MD;  Location: [facility];  Service: General;  Laterality: Right;    Family History  Problem Relation Age of Onset  . Diabetes Mother   . Uterine cancer Paternal Aunt 58  . Heart attack Paternal Uncle     unspecified age  . Heart attack Paternal Grandfather 46  . Other Paternal Uncle       stomach swollen with fluid that had to be removed; lim info    Social History   Social History  . Marital status: Married    Spouse name: N/A  . Number of children: N/A  . Years of education: N/A   Social History Main Topics  . Smoking status: Passive Smoke Exposure - Never Smoker  . Smokeless tobacco: Never Used  . Alcohol use No  . Drug use: No  . Sexual activity: Yes    Birth control/ protection: None   Other Topics Concern  . None   Social History Narrative  . None     PHYSICAL EXAMINATION  ECOG PERFORMANCE STATUS: 0 - Asymptomatic  There were no vitals filed for this visit.  Vitals - 1 value per visit 1/57/2620  SYSTOLIC 355  DIASTOLIC 77  Pulse 95  Temperature 97.6  Respirations 18    GENERAL:alert, no distress, well nourished, well developed, comfortable, cooperative, smiling and no interpreter on site, but accompanied by former cancer patient who participates in interpretation. SKIN: skin color, texture, turgor are normal, no rashes or significant lesions HEAD: Normocephalic, No masses, lesions, tenderness or abnormalities EYES: normal, EOMI, Conjunctiva are pink and non-injected EARS: External ears normal OROPHARYNX:lips, buccal mucosa, and tongue normal and mucous membranes are moist  NECK: supple, trachea midline LYMPH:  not examined BREAST:not examined LUNGS: clear to auscultation bilaterally without wheezes rales or rhonchi HEART: regular rate & rhythm, no murmurs, no gallops, S1 normal and S2 normal ABDOMEN:abdomen soft, non-tender and normal bowel sounds BACK: Back symmetric, no curvature. EXTREMITIES:less then 2 second capillary refill, no joint deformities, effusion, or inflammation, no skin discoloration, no cyanosis  NEURO: alert & oriented x 3 with fluent speech, no focal motor/sensory deficits, gait normal   LABORATORY DATA: CBC    Component Value Date/Time   WBC 5.0 05/05/2016 0902   RBC 3.80 (L) 05/05/2016 0902   HGB 11.2 (L)  05/05/2016 0902   HCT 33.7 (L) 05/05/2016 0902   PLT 371 05/05/2016 0902   MCV 88.7 05/05/2016 0902   MCH 29.5 05/05/2016 0902   MCHC 33.2 05/05/2016 0902   RDW 15.6 (H) 05/05/2016 0902   LYMPHSABS 1.1 05/05/2016 0902   MONOABS 0.5 05/05/2016 0902   EOSABS 0.1 05/05/2016 0902   BASOSABS 0.0 05/05/2016 0902      Chemistry      Component Value Date/Time   NA 137 04/28/2016 1028   K 3.7 04/28/2016 1028   CL 106 04/28/2016 1028   CO2 24 04/28/2016 1028   BUN 7 04/28/2016 1028   CREATININE 0.50 04/28/2016 1028      Component Value Date/Time   CALCIUM 9.1 04/28/2016 1028   ALKPHOS 77 04/28/2016 1028   AST 53 (H) 04/28/2016 1028   ALT 97 (H) 04/28/2016 1028   BILITOT 0.5 04/28/2016 1028  PENDING LABS:   RADIOGRAPHIC STUDIES:  No results found.   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 Arnoldo Morale on 12/09/2015.  Now on systemic adjuvant chemotherapy.  Oncology history is up to date.  Labs today: CBC diff, CMET.  I personally reviewed and went over laboratory results with the patient.  The results are noted within this dictation.  Labs satisfy treatment parameters today.    She has been referred to XRT in [place].  She has a consultation appointment with Dr. Lisbeth Renshaw on 05/16/2016.  Of note, she is uninsured and undocumented.    Final treatment is next week for cycle #12 of Taxol.    Return in 4 weeks for follow-up.   ORDERS PLACED FOR THIS ENCOUNTER: No orders of the defined types were placed in this encounter.   MEDICATIONS PRESCRIBED THIS ENCOUNTER: No orders of the defined types were placed in this encounter.   THERAPY PLAN:  Continue with therapy as planned.  Will refer to radiation oncology for adjuvant XRT.  All questions were answered. The patient  knows to call the clinic with any problems, questions or concerns. We can certainly see the patient much sooner if necessary.  Patient and plan discussed with Dr. Ancil Linsey and she is in agreement with the aforementioned.   This note is electronically signed by: Doy Mince 05/05/2016 9:39 AM

## 2016-05-05 NOTE — Patient Instructions (Signed)
[place] at [facility] Discharge Instructions  RECOMMENDATIONS MADE BY THE CONSULTANT AND ANY TEST RESULTS WILL BE SENT TO YOUR REFERRING PHYSICIAN.  You were seen by Gershon Mussel today Return as scheduled in 4 weeks Radiology appointment on 05/16/16  Thank you for choosing [place] at [facility] to provide your oncology and hematology care.  To afford each patient quality time with our provider, please arrive at least 15 minutes before your scheduled appointment time.   Beginning January 23rd 2017 lab work for the [facility] will be done in the  Main lab at [facility] on 1st floor. If you have a lab appointment with the [facility] please come in thru the  Main Entrance and check in at the main information desk  You need to re-schedule your appointment should you arrive 10 or more minutes late.  We strive to give you quality time with our providers, and arriving late affects you and other patients whose appointments are after yours.  Also, if you no show three or more times for appointments you may be dismissed from the clinic at the providers discretion.     Again, thank you for choosing [facility].  Our hope is that these requests will decrease the amount of time that you wait before being seen by our physicians.       _____________________________________________________________  Should you have questions after your visit to [facility], please contact our office at [phone] between the hours of 8:30 a.m. and 4:30 p.m.  Voicemails left after 4:30 p.m. will not be returned until the following business day.  For prescription refill requests, have your pharmacy contact our office.         Resources For Cancer Patients and their Caregivers ? American Cancer Society: Can assist with transportation, wigs, general needs, runs Look Good Feel Better.        334-781-6695 ? Cancer Care: Provides financial  assistance, online support groups, medication/co-pay assistance.  1-800-813-HOPE 5196072272) ? [place] Assists [place] cancer patients and their families through emotional , educational and financial support.  ([phone] 030-0599 ? [place] DSS Where to apply for food stamps, Medicaid and utility assistance. 431 383 0080 ? RCATS: Transportation to medical appointments. 408-087-4236 ? Social Security Administration: May apply for disability if have a Stage IV cancer. 830-772-6565 636-364-6487 ? [employer], Disability and Transit Services: Assists with nutrition, care and transit needs. [place] Support Programs: @10RELATIVEDAYS @ > Cancer Support Group  2nd Tuesday of the month 1pm-2pm, Journey Room  > Creative Journey  3rd Tuesday of the month 1130am-1pm, Journey Room  > Look Good Feel Better  1st Wednesday of the month 10am-12 noon, Journey Room (Call [place] to register [phone])

## 2016-05-05 NOTE — Progress Notes (Signed)
Caroleen Hamman, PA-C aware of lab results, specifically ALT of 107.  Okay to tx today per PA.   Tolerated tx w/o adverse reaction.  Alert, in no distress.  VSS.  Discharged ambulatory.

## 2016-05-12 ENCOUNTER — Encounter (HOSPITAL_COMMUNITY): Payer: Self-pay

## 2016-05-12 ENCOUNTER — Encounter (HOSPITAL_COMMUNITY): Payer: Self-pay | Attending: Hematology & Oncology

## 2016-05-12 VITALS — BP 127/66 | HR 96 | Temp 98.0°F | Resp 18 | Wt 144.0 lb

## 2016-05-12 DIAGNOSIS — Z171 Estrogen receptor negative status [ER-]: Secondary | ICD-10-CM

## 2016-05-12 DIAGNOSIS — Z5111 Encounter for antineoplastic chemotherapy: Secondary | ICD-10-CM

## 2016-05-12 DIAGNOSIS — R1013 Epigastric pain: Secondary | ICD-10-CM | POA: Insufficient documentation

## 2016-05-12 DIAGNOSIS — R51 Headache: Secondary | ICD-10-CM | POA: Insufficient documentation

## 2016-05-12 DIAGNOSIS — Z79899 Other long term (current) drug therapy: Secondary | ICD-10-CM | POA: Insufficient documentation

## 2016-05-12 DIAGNOSIS — N644 Mastodynia: Secondary | ICD-10-CM | POA: Insufficient documentation

## 2016-05-12 DIAGNOSIS — C773 Secondary and unspecified malignant neoplasm of axilla and upper limb lymph nodes: Secondary | ICD-10-CM

## 2016-05-12 DIAGNOSIS — C50912 Malignant neoplasm of unspecified site of left female breast: Secondary | ICD-10-CM | POA: Insufficient documentation

## 2016-05-12 DIAGNOSIS — C50112 Malignant neoplasm of central portion of left female breast: Secondary | ICD-10-CM

## 2016-05-12 DIAGNOSIS — K219 Gastro-esophageal reflux disease without esophagitis: Secondary | ICD-10-CM | POA: Insufficient documentation

## 2016-05-12 LAB — CBC WITH DIFFERENTIAL/PLATELET
Basophils Absolute: 0 10*3/uL (ref 0.0–0.1)
Basophils Relative: 0 %
Eosinophils Absolute: 0.1 10*3/uL (ref 0.0–0.7)
Eosinophils Relative: 2 %
HCT: 33.5 % — ABNORMAL LOW (ref 36.0–46.0)
Hemoglobin: 11.1 g/dL — ABNORMAL LOW (ref 12.0–15.0)
Lymphocytes Relative: 22 %
Lymphs Abs: 1.1 10*3/uL (ref 0.7–4.0)
MCH: 29.4 pg (ref 26.0–34.0)
MCHC: 33.1 g/dL (ref 30.0–36.0)
MCV: 88.6 fL (ref 78.0–100.0)
Monocytes Absolute: 0.5 10*3/uL (ref 0.1–1.0)
Monocytes Relative: 10 %
Neutro Abs: 3.4 10*3/uL (ref 1.7–7.7)
Neutrophils Relative %: 66 %
Platelets: 374 10*3/uL (ref 150–400)
RBC: 3.78 MIL/uL — ABNORMAL LOW (ref 3.87–5.11)
RDW: 15.5 % (ref 11.5–15.5)
WBC: 5.1 10*3/uL (ref 4.0–10.5)

## 2016-05-12 LAB — COMPREHENSIVE METABOLIC PANEL
ALT: 133 U/L — ABNORMAL HIGH (ref 14–54)
AST: 75 U/L — ABNORMAL HIGH (ref 15–41)
Albumin: 4.1 g/dL (ref 3.5–5.0)
Alkaline Phosphatase: 79 U/L (ref 38–126)
Anion gap: 7 (ref 5–15)
BUN: 6 mg/dL (ref 6–20)
CO2: 24 mmol/L (ref 22–32)
Calcium: 9.3 mg/dL (ref 8.9–10.3)
Chloride: 107 mmol/L (ref 101–111)
Creatinine, Ser: 0.5 mg/dL (ref 0.44–1.00)
GFR calc Af Amer: >60 mL/min (ref >60)
GFR calc non Af Amer: >60 mL/min (ref >60)
Glucose, Bld: 129 mg/dL — ABNORMAL HIGH (ref 65–99)
Potassium: 3.6 mmol/L (ref 3.5–5.1)
Sodium: 138 mmol/L (ref 135–145)
Total Bilirubin: 0.5 mg/dL (ref 0.3–1.2)
Total Protein: 6.9 g/dL (ref 6.5–8.1)

## 2016-05-12 MED ORDER — FAMOTIDINE IN NACL 20-0.9 MG/50ML-% IV SOLN
20.0000 mg | Freq: Once | INTRAVENOUS | Status: AC
  Administered 2016-05-12: 20 mg via INTRAVENOUS
  Filled 2016-05-12: qty 50

## 2016-05-12 MED ORDER — PALONOSETRON HCL INJECTION 0.25 MG/5ML
INTRAVENOUS | Status: AC
  Filled 2016-05-12: qty 5

## 2016-05-12 MED ORDER — DEXTROSE 5 % IV SOLN
80.0000 mg/m2 | Freq: Once | INTRAVENOUS | Status: AC
  Administered 2016-05-12: 132 mg via INTRAVENOUS
  Filled 2016-05-12: qty 22

## 2016-05-12 MED ORDER — HEPARIN SOD (PORK) LOCK FLUSH 100 UNIT/ML IV SOLN
500.0000 [IU] | Freq: Once | INTRAVENOUS | Status: AC | PRN
  Administered 2016-05-12: 500 [IU]
  Filled 2016-05-12: qty 5

## 2016-05-12 MED ORDER — SODIUM CHLORIDE 0.9 % IV SOLN
Freq: Once | INTRAVENOUS | Status: AC
  Administered 2016-05-12: 12:00:00 via INTRAVENOUS

## 2016-05-12 MED ORDER — SODIUM CHLORIDE 0.9% FLUSH
10.0000 mL | INTRAVENOUS | Status: DC | PRN
  Administered 2016-05-12: 10 mL
  Filled 2016-05-12: qty 10

## 2016-05-12 MED ORDER — SODIUM CHLORIDE 0.9 % IV SOLN
20.0000 mg | Freq: Once | INTRAVENOUS | Status: AC
  Administered 2016-05-12: 20 mg via INTRAVENOUS
  Filled 2016-05-12: qty 2

## 2016-05-12 MED ORDER — DIPHENHYDRAMINE HCL 50 MG/ML IJ SOLN
50.0000 mg | Freq: Once | INTRAMUSCULAR | Status: AC
  Administered 2016-05-12: 50 mg via INTRAVENOUS
  Filled 2016-05-12: qty 1

## 2016-05-12 NOTE — Progress Notes (Signed)
Location of Breast Cancer: Left Breast  Central portion  Histology per Pathology Report:                  09/23/15: left breast bx=invasive ductal carcinoma,high grade ,conmsistent with metastasis left lymph node done at [facility]  Receptor Status: ER(neg), PR (neg ), Her2-neu (neg ),ratio=1.35,  Ki-(85%)  Did patient present with symptoms (if so, please note symptoms) or was this found on screening mammography?: blood discharge from nipple,  and pain 1 month, patient noticed herself, went to free clinic   Past/Anticipated interventions by surgeon, if any:5/3/217 left mastectomy Diagnosis 12/09/2015: Dr. Aviva Signs, MD Breast, modified radical mastectomy , left breast - INVASIVE GRADE III DUCTAL CARCINOMA, SPANNING 2.3 CM IN GREATEST DIMENSION. - ASSOCIATED HIGH GRADE DUCTAL CARCINOMA IN SITU. - MARGINS ARE NEGATIVE. 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 - EIGHT ADDITIONAL LYMPH NODES WITH NO TUMOR SEEN (0/1).   Past/Anticipated interventions by medical oncology, if any: Chemotherapy : Dr. Ancil Linsey, MD [facility] on systemic chemotherapy Paclitaxel weekly x 12 last treatment next week  Then f/u  1 month   Lymphedema issues, if any:  No  Pain issues, if any:  Hot flashes, no pain  SAFETY ISSUES: Needs Interpreter Lavon Paganini  Prior radiation? NO  Pacemaker/ICD?  NO  Possible current pregnancy? No   Is the patient on methotrexate? NO  Current Complaints / other details:  Married, G3P3, menarche age 36, 49st live birth age 20, irregular menses,premenopausal , no HRT, , no tobbaco use, no alcohol,no illicit drug use Paternal Aunt Uterine cancer,  Rebecca Eaton, RN 05/12/2016,2:24 PM  Allergies:NKA BP 136/85 (BP Location: Right Arm, Patient Position: Sitting, Cuff Size: Normal)   Pulse 97   Temp 98.1 F (36.7 C) (Oral)   Resp 16   Ht 5' 2"  (1.575 m)   Wt 144 lb (65.3 kg)   BMI 26.34 kg/m   Wt Readings from Last 3  Encounters:  05/16/16 144 lb (65.3 kg)  05/12/16 144 lb (65.3 kg)  05/05/16 144 lb 9.6 oz (65.6 kg)

## 2016-05-12 NOTE — Progress Notes (Signed)
Chemotherapy given today per orders. Patient tolerated well without problems. Vitals stable, discharged from clinic with family present. Ambulatory.

## 2016-05-12 NOTE — Patient Instructions (Signed)
[facility] Discharge Instructions for Patients Receiving Chemotherapy   Beginning January 23rd 2017 lab work for the [facility] will be done in the  Main lab at [facility] on 1st floor. If you have a lab appointment with the [facility] please come in thru the  Main Entrance and check in at the main information desk   Today you received the following chemotherapy agents Taxol.   To help prevent nausea and vomiting after your treatment, we encourage you to take your nausea medication     If you develop nausea and vomiting, or diarrhea that is not controlled by your medication, call the clinic.  The clinic phone number is [phone]. Office hours are Monday-Friday 8:30am-5:00pm.  BELOW ARE SYMPTOMS THAT SHOULD BE REPORTED IMMEDIATELY:  *FEVER GREATER THAN 101.0 F  *CHILLS WITH OR WITHOUT FEVER  NAUSEA AND VOMITING THAT IS NOT CONTROLLED WITH YOUR NAUSEA MEDICATION  *UNUSUAL SHORTNESS OF BREATH  *UNUSUAL BRUISING OR BLEEDING  TENDERNESS IN MOUTH AND THROAT WITH OR WITHOUT PRESENCE OF ULCERS  *URINARY PROBLEMS  *BOWEL PROBLEMS  UNUSUAL RASH Items with * indicate a potential emergency and should be followed up as soon as possible. If you have an emergency after office hours please contact your primary care physician or go to the nearest emergency department.  Please call the clinic during office hours if you have any questions or concerns.   You may also contact the Patient Navigator at [phone] should you have any questions or need assistance in obtaining follow up care.      Resources For Cancer Patients and their Caregivers ? American Cancer Society: Can assist with transportation, wigs, general needs, runs Look Good Feel Better.        [phone] ? Cancer Care: Provides financial assistance, online support groups, medication/co-pay assistance.  1-800-813-HOPE (319) 190-1440) ? [place] Assists [place]  cancer patients and their families through emotional , educational and financial support.  (754)502-7471 ? [place] DSS Where to apply for food stamps, Medicaid and utility assistance. 617-732-5735 ? RCATS: Transportation to medical appointments. 6500391148 ? Social Security Administration: May apply for disability if have a Stage IV cancer. 276-692-8236 [phone])665-0448 ? [employer], Disability and Transit Services: Assists with nutrition, care and transit needs. [phone]

## 2016-05-16 ENCOUNTER — Ambulatory Visit
Admission: RE | Admit: 2016-05-16 | Discharge: 2016-05-16 | Disposition: A | Discharge status: Home / Self Care | Payer: Self-pay | Source: Ambulatory Visit | Attending: Radiation Oncology | Admitting: Radiation Oncology

## 2016-05-16 ENCOUNTER — Encounter: Payer: Self-pay | Admitting: Radiation Oncology

## 2016-05-16 DIAGNOSIS — N644 Mastodynia: Secondary | ICD-10-CM | POA: Insufficient documentation

## 2016-05-16 DIAGNOSIS — Z79899 Other long term (current) drug therapy: Secondary | ICD-10-CM | POA: Insufficient documentation

## 2016-05-16 DIAGNOSIS — C50112 Malignant neoplasm of central portion of left female breast: Secondary | ICD-10-CM

## 2016-05-16 DIAGNOSIS — D509 Iron deficiency anemia, unspecified: Secondary | ICD-10-CM | POA: Insufficient documentation

## 2016-05-16 DIAGNOSIS — Z808 Family history of malignant neoplasm of other organs or systems: Secondary | ICD-10-CM | POA: Insufficient documentation

## 2016-05-16 DIAGNOSIS — Z9889 Other specified postprocedural states: Secondary | ICD-10-CM | POA: Insufficient documentation

## 2016-05-16 DIAGNOSIS — Z833 Family history of diabetes mellitus: Secondary | ICD-10-CM | POA: Insufficient documentation

## 2016-05-16 DIAGNOSIS — Z9012 Acquired absence of left breast and nipple: Secondary | ICD-10-CM | POA: Insufficient documentation

## 2016-05-16 DIAGNOSIS — Z510 Encounter for antineoplastic radiation therapy: Secondary | ICD-10-CM | POA: Insufficient documentation

## 2016-05-16 DIAGNOSIS — Z8249 Family history of ischemic heart disease and other diseases of the circulatory system: Secondary | ICD-10-CM | POA: Insufficient documentation

## 2016-05-16 DIAGNOSIS — K219 Gastro-esophageal reflux disease without esophagitis: Secondary | ICD-10-CM | POA: Insufficient documentation

## 2016-05-16 DIAGNOSIS — Z9221 Personal history of antineoplastic chemotherapy: Secondary | ICD-10-CM | POA: Insufficient documentation

## 2016-05-16 NOTE — Progress Notes (Signed)
Please see the Nurse Progress Note in the MD Initial Consult Encounter for this patient. 

## 2016-05-16 NOTE — Progress Notes (Signed)
Radiation Oncology         (336) [phone] ________________________________  Name: Michele Salas MRN: [id number]  Date: 05/16/2016  DOB: 02-May-1981  NT:IRWERXV McElroy, PA-C  Kefalas, Manon Hilding, PA-C     REFERRING PHYSICIAN: Baird Cancer, PA-C   DIAGNOSIS: The encounter diagnosis was Malignant neoplasm of central portion of left female breast, unspecified estrogen receptor status ([place]).  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 female who is seen for an initial consultation visit regarding the patient's diagnosis of left breast cancer. The patient has completed cycle 12 (and last cycle) of Paclitaxel on 05/12/16 by Dr. Whitney Muse. The patient has been referred to radiation oncology to discuss radiation concerning her positive left axillary lymph nodes. Accompanying the patient was an interpreter Benjamine Sprague).  The patient had a mammogram because she had a bloody discharge from the left nipple and breast pain for a month. She went to a free clinic for evaluation.    Cancer of central portion of left female breast ([place])   09/16/2015 Mammogram    1.6 cm retroareolar left breast mass measuring 1.6 cm in largest diameter in addition to 3 abnormal appearing left inferior axillary lymph nodes.      09/23/2015 Initial Biopsy    US guided core biopsy of left breast mass and suspicious left axillary lymph nodes.      09/23/2015 Pathology Results    Invasive ductal carcinoma, high grade.  Left axilla lymph node is positive for metastatic carcinoma.  ER/PR NEGATIVE, HER2 NEGATIVE, KI-67 marker of 85%.      10/26/2015 Imaging    Bone scan- No evidence of metastatic disease to the skeleton.      10/27/2015 Imaging    CT CAP-  Retroareolar mass with 2 asymmetrically prominent left axillary lymph nodes and an upper normal size left subpectoral lymph node.  No other findings of metastatic disease in the chest, abdomen, or pelvis.      11/05/2015 Echocardiogram    The estimated ejection fraction was in the range of 60% to 65%.      11/23/2015 Miscellaneous    Genetic Counseling- genetic test result was negative for any known pathogenic mutations within any of 28 genes that would cause her to be at an increased genetic risk for breast, ovarian, or other related cancers.      12/09/2015 Surgery    L radical mastectomy, axillary LN dissection, port a cath insertion with Dr. Aviva Signs 12/09/2015      12/11/2015 Pathology Results    - INVASIVE GRADE III DUCTAL CARCINOMA, SPANNING 2.3 CM IN GREATEST DIMENSION. - ASSOCIATED HIGH GRADE DCIS - MARGINS ARE NEGATIVE. 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 - EIGHT ADDITIONAL LYMPH NODES WITH NO TUMOR SEEN      12/11/2015 Pathology Results    HER2 NEGATIVE, ER 0%, PR 0%, Ki-67 marker 90%      12/25/2015 - 02/12/2016 Chemotherapy    AC every 2 weeks x 4 cycles      02/04/2016 Treatment Plan Change    Treatment deferred x 7 days due to neutropenia      02/25/2016 -  Chemotherapy    Paclitaxel weekly x 12       PREVIOUS RADIATION THERAPY: No   PAST MEDICAL HISTORY:  Past Medical History:  Diagnosis Date  . Breast cancer, left ([place]) 10/22/2015  .  GERD (gastroesophageal reflux disease)   . Iron deficiency anemia 10/27/2015       PAST SURGICAL HISTORY: Past Surgical History:  Procedure Laterality Date  . MASTECTOMY MODIFIED RADICAL Left 12/09/2015   Procedure: LEFT MODIFIED RADICAL MASTECTOMY;  Surgeon: Aviva Signs, MD;  Location: [facility];  Service: General;  Laterality: Left;  . PORTACATH PLACEMENT Right 12/09/2015   Procedure: INSERTION PORT-A-CATH RIGHT SUBCLAVIAN;  Surgeon: Aviva Signs, MD;  Location: [facility];  Service: General;  Laterality: Right;     FAMILY HISTORY:  Family History  Problem Relation Age of Onset  . Diabetes Mother   . Uterine cancer Paternal  Aunt 40  . Heart attack Paternal Uncle     unspecified age  . Heart attack Paternal Grandfather 10  . Other Paternal Uncle     stomach swollen with fluid that had to be removed; lim info     SOCIAL HISTORY:  reports that she is a non-smoker but has been exposed to tobacco smoke. She has never used smokeless tobacco. She reports that she does not drink alcohol or use drugs. The patient is married and lives in [place]. She has three children, and is originally from [place]. She has lived in the [place] for 16 years.   ALLERGIES: Review of patient's allergies indicates no known allergies.   MEDICATIONS:  Current Outpatient Prescriptions  Medication Sig Dispense Refill  . lidocaine-prilocaine (EMLA) cream Apply a quarter size amount to port site 1 hour prior to chemo. Do not rub in. Cover with plastic wrap. 30 g 3  . omeprazole (PRILOSEC) 40 MG capsule Take 1 capsule (40 mg total) by mouth daily. 30 capsule 3  . ondansetron (ZOFRAN) 8 MG tablet Take 1 tablet (8 mg total) by mouth every 8 (eight) hours as needed for nausea or vomiting. 30 tablet 2  . oxyCODONE-acetaminophen (PERCOCET/ROXICET) 5-325 MG tablet Take 1-2 tablets by mouth every 4 (four) hours as needed for moderate pain. 40 tablet 0  . Pegfilgrastim (NEULASTA ONPRO [place]) Inject into the skin. To be given 27 hours after the completion of chemo.    . potassium chloride SA (K-DUR,KLOR-CON) 20 MEQ tablet Take 0.5 tablets (10 mEq total) by mouth daily. 30 tablet 1  . prochlorperazine (COMPAZINE) 10 MG tablet Take 1 tablet (10 mg total) by mouth every 6 (six) hours as needed for nausea or vomiting. 30 tablet 2  . propranolol (INDERAL) 20 MG tablet 1 po bid. Tome una tableta por boca dos veces diarias 60 tablet 3   No current facility-administered medications for this encounter.      REVIEW OF SYSTEMS: On review of systems, the patient reports that she is doing well overall. She denies any chest pain, shortness of breath, cough, fevers,  chills, unintended weight changes. She denies any bowel or bladder disturbances, and denies abdominal pain, nausea or vomiting. She denies any new musculoskeletal or joint aches or pains. The patient reports hot flashes. A complete review of systems is obtained and is otherwise negative.  PHYSICAL EXAM:  height is _0  (1.575 m) and weight is 144 lb (65.3 kg). Her oral temperature is 98.1 F (36.7 C). Her blood pressure is 136/85 and her pulse is 97. Her respiration is 16.   In general this is a well appearing hispanic female in no acute distress. She is alert and oriented x4 and appropriate throughout the examination. HEENT reveals that the patient is normocephalic, atraumatic. EOMs are intact. PERRLA. Skin is intact without any evidence of gross lesions.  Cardiovascular exam reveals a regular rate and rhythm, no clicks rubs or murmurs are auscultated. Chest is clear to auscultation bilaterally. Lymphatic assessment is performed and does not reveal any adenopathy in the cervical, supraclavicular, axillary, or inguinal chains. The left chest wall mastectomy scar is well healed without erythema. No edema of the chest wall or left upper extremity is noted. Abdomen has active bowel sounds in all quadrants and is intact. The abdomen is soft, non tender, non distended. Lower extremities are negative for pretibial pitting edema, deep calf tenderness, cyanosis or clubbing.  ECOG = 1  0 - Asymptomatic (Fully active, able to carry on all predisease activities without restriction)  1 - Symptomatic but completely ambulatory (Restricted in physically strenuous activity but ambulatory and able to carry out work of a light or sedentary nature. For example, light housework, office work)  2 - Symptomatic, <50% in bed during the day (Ambulatory and capable of all self care but unable to carry out any work activities. Up and about more than 50% of waking hours)  3 - Symptomatic, >50% in bed, but not bedbound (Capable of  only limited self-care, confined to bed or chair 50% or more of waking hours)  4 - Bedbound (Completely disabled. Cannot carry on any self-care. Totally confined to bed or chair)  5 - Death   Eustace Pen MM, Creech RH, Tormey DC, et al. [phone]). "Toxicity and response criteria of the [facility]". [place] Oncol. 5 (6): 649-55    LABORATORY DATA:  Lab Results  Component Value Date   WBC 5.1 05/12/2016   HGB 11.1 (L) 05/12/2016   HCT 33.5 (L) 05/12/2016   MCV 88.6 05/12/2016   PLT 374 05/12/2016   Lab Results  Component Value Date   NA 138 05/12/2016   K 3.6 05/12/2016   CL 107 05/12/2016   CO2 24 05/12/2016   Lab Results  Component Value Date   ALT 133 (H) 05/12/2016   AST 75 (H) 05/12/2016   ALKPHOS 79 05/12/2016   BILITOT 0.5 05/12/2016      RADIOGRAPHY: No results found.     IMPRESSION: 1.  35 y.o. 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 The patient completed her adjuvant chemotherapy on 05/12/16. She's appropriate at this time to proceed with adjuvant radiation to the left chest wall and left supraclavicular region. Dr. Lisbeth Renshaw reviewed the diagnosis. We discussed the role of radiation in decreasing local failures in patients who undergo mastectomy and have risk factors for recurrence including positive lymph nodes and/or tumors over 5 cm and/or positive margins. We discussed the process of CT simulation and the placement tattoos. We discussed approximately 33 fractions of treatment over 6 1/2 weeks of treatment as an outpatient. We discussed the possibility of asymptomatic lung damage. We discussed the low likelihood of secondary malignancies. We discussed the possible side effects including but not limited to skin redness, fatigue, permanent skin darkening, and chest wall swelling. We discussed increased complications that can occur with reconstruction after  radiation. We discussed the use of cardiac sparing with deep inspiration breath hold if needed. She is scheduled for simulation, and we have obtained written consent to move forward.  The above documentation reflects my direct findings during this shared patient visit. Please see the separate note by Dr. Lisbeth Renshaw on this date for the remainder of the patient's plan of care.    Lalla Brothers  Dara Lords, [facility]  This document serves as a record of services personally performed by Shona Simpson, PA-C and Kyung Rudd, MD. It was created on their behalf by Darcus Austin, a trained medical scribe. The creation of this record is based on the scribe's personal observations and the providers' statements to them. This document has been checked and approved by the attending provider.

## 2016-05-17 ENCOUNTER — Other Ambulatory Visit: Payer: Self-pay | Admitting: Radiation Oncology

## 2016-05-17 DIAGNOSIS — C50112 Malignant neoplasm of central portion of left female breast: Secondary | ICD-10-CM

## 2016-05-25 ENCOUNTER — Ambulatory Visit
Admission: RE | Admit: 2016-05-25 | Discharge: 2016-05-25 | Disposition: A | Discharge status: Home / Self Care | Payer: Self-pay | Source: Ambulatory Visit | Attending: Radiation Oncology | Admitting: Radiation Oncology

## 2016-05-25 ENCOUNTER — Telehealth: Payer: Self-pay | Admitting: *Deleted

## 2016-05-25 DIAGNOSIS — C50112 Malignant neoplasm of central portion of left female breast: Secondary | ICD-10-CM

## 2016-05-25 DIAGNOSIS — Z171 Estrogen receptor negative status [ER-]: Secondary | ICD-10-CM

## 2016-05-25 NOTE — Telephone Encounter (Signed)
Called [facility] after CT simn Katie called and satted they needed an interpreter for 1000am pt,  9:26 AM

## 2016-05-27 NOTE — Progress Notes (Signed)
  Radiation Oncology         [phone]) [phone] ________________________________  Name: Clista Rainford MRN: [id number]  Date: 05/25/2016  DOB: [birth year]  Diagnosis DIAGNOSIS:     ICD-9-CM ICD-10-CM   1. Malignant neoplasm of central portion of left breast in female, estrogen receptor negative (HCC) 174.1 C50.112    V86.1 Z17.1      SIMULATION AND TREATMENT PLANNING NOTE  The patient presented for simulation prior to beginning her course of radiation treatment for her diagnosis of left-sided breast cancer. The patient was placed in a supine position on a breast board. A customized vac-lock bag was also constructed and this complex treatment device will be used on a daily basis during her treatment. In this fashion, a CT scan was obtained through the chest area and an isocenter was placed near the chest wall at the upper aspect of the right chest. A breath-hold technique has also been evaluated to determine if this significantly improves the spatial relationship between the target region and the heart. Based on this analysis, a breath-hold technique has been ordered for the patient's treatment.  The patient will be planned to receive a course of radiation initially to a dose of 50.4 gray. This will consist of a 4 field technique targeting the left chest wall as well as the supraclavicular region. Therefore 2 customized medial and lateral tangent fields have been created targeting the chest wall, and also 2 additional customized fields have been designed to treat the supraclavicular region both with a left supraclavicular field and a left posterior axillary boost field. A forward planning/reduced field technique will also be evaluated to determine if this significantly improves the dose homogeneity of the overall plan. Therefore, additional customized blocks/fields may be necessary.  This initial treatment will be accomplished at 1.8 gray per fraction.   The initial plan will consist of a 3-D  conformal technique. The target volume/scar, heart and lungs have been contoured and dose volume histograms of each of these structures will be evaluated as part of the 3-D conformal treatment planning process.   It is anticipated that the patient will then receive a 10 gray boost to the surgical scar. This will be accomplished at 2 gray per fraction. The final anticipated total dose therefore will correspond to 60.4 gray.   Special treatment procedure was performed today due to the extra time and effort required by myself to plan and prepare this patient for deep inspiration breath hold technique.  I have determined cardiac sparing to be of benefit to this patient to prevent long term cardiac damage due to radiation of the heart.  Bellows were placed on the patient's abdomen. To facilitate cardiac sparing, the patient was coached by the radiation therapists on breath hold techniques and breathing practice was performed. Practice waveforms were obtained. The patient was then scanned while maintaining breath hold in the treatment position.  This image was then transferred over to the imaging specialist. The imaging specialist then created a fusion of the free breathing and breath hold scans using the chest wall as the stable structure. I personally reviewed the fusion in axial, coronal and sagittal image planes.  Excellent cardiac sparing was obtained.  I felt the patient is an appropriate candidate for breath hold and the patient will be treated as such.  The image fusion was then reviewed with the patient to reinforce the necessity of reproducible breath hold.      _______________________________   Jodelle Gross, MD, PhD

## 2016-05-27 NOTE — Progress Notes (Signed)
  Radiation Oncology         [phone]) [phone] ________________________________  Name: Michele Salas MRN: [id number]  Date: 05/25/2016  DOB: Nov 16, 1980  Optical Surface Tracking Plan:  Since intensity modulated radiotherapy (IMRT) and 3D conformal radiation treatment methods are predicated on accurate and precise positioning for treatment, intrafraction motion monitoring is medically necessary to ensure accurate and safe treatment delivery.  The ability to quantify intrafraction motion without excessive ionizing radiation dose can only be performed with optical surface tracking. Accordingly, surface imaging offers the opportunity to obtain 3D measurements of patient position throughout IMRT and 3D treatments without excessive radiation exposure.  I am ordering optical surface tracking for this patient's upcoming course of radiotherapy. ________________________________  Kyung Rudd, MD 05/27/2016 8:01 AM    Reference:   Particia Jasper, et al. Surface imaging-based analysis of intrafraction motion for breast radiotherapy patients.Journal of [place], n. 6, nov. 2014. ISSN [id number].   Available at: <http://www.jacmp.org/index.php/jacmp/article/view/4957>.

## 2016-05-31 ENCOUNTER — Encounter: Payer: Self-pay | Admitting: Physical Therapy

## 2016-05-31 ENCOUNTER — Ambulatory Visit: Payer: Self-pay | Attending: Radiation Oncology | Admitting: Physical Therapy

## 2016-05-31 DIAGNOSIS — R600 Localized edema: Secondary | ICD-10-CM | POA: Insufficient documentation

## 2016-05-31 DIAGNOSIS — M25612 Stiffness of left shoulder, not elsewhere classified: Secondary | ICD-10-CM | POA: Insufficient documentation

## 2016-05-31 DIAGNOSIS — M25512 Pain in left shoulder: Secondary | ICD-10-CM | POA: Insufficient documentation

## 2016-05-31 NOTE — Addendum Note (Signed)
Addended by: Alexia Freestone on: 05/31/2016 12:08 PM   Modules accepted: Orders

## 2016-05-31 NOTE — Therapy (Addendum)
[place], [place], [address] Phone: [phone]   Fax:  [phone]  Physical Therapy Evaluation  Patient Details  Name: Michele Salas MRN: [id number] Date of Birth: 1980-11-02 Referring Provider: Hayden Pedro  Encounter Date: 05/31/2016      PT End of Session - 05/31/16 1022    Visit Number 1   Number of Visits 9   Date for PT Re-Evaluation 06/28/16   PT Start Time 0935   PT Stop Time 1020   PT Time Calculation (min) 45 min   Activity Tolerance Patient tolerated treatment well   Behavior During Therapy [facility] for tasks assessed/performed      Past Medical History:  Diagnosis Date  . Breast cancer, left ([place]) 10/22/2015  . GERD (gastroesophageal reflux disease)   . Iron deficiency anemia 10/27/2015    Past Surgical History:  Procedure Laterality Date  . MASTECTOMY MODIFIED RADICAL Left 12/09/2015   Procedure: LEFT MODIFIED RADICAL MASTECTOMY;  Surgeon: Aviva Signs, MD;  Location: [facility];  Service: General;  Laterality: Left;  . PORTACATH PLACEMENT Right 12/09/2015   Procedure: INSERTION PORT-A-CATH RIGHT SUBCLAVIAN;  Surgeon: Aviva Signs, MD;  Location: [facility];  Service: General;  Laterality: Right;    There were no vitals filed for this visit.       Subjective Assessment - 05/31/16 9937    Subjective The surgery was on May 3rd, 2017 then after that was chemo and now I am going to start radiation. I am going to start on Thursday. I had a mastectomy. I do not know if they removed lymph nodes.    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 - ASSOCIATED HIGH GRADE DCIS - MARGINS ARE NEGATIVE. 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 - EIGHT ADDITIONAL LYMPH NODES WITH NO TUMOR SEEN, HER2 NEGATIVE, ER 0%, PR 0%, Ki-67 marker 90%, left mastectomy, completed chemo, going  to begin radiation 06/02/16   Patient Stated Goals I really don't know what it is   Currently in Pain? No/denies   Pain Score 0-No pain            [facility] PT Assessment - 05/31/16 0001      Assessment   Medical Diagnosis left breast cancer   Referring Provider Hayden Pedro   Onset Date/Surgical Date 12/09/15   Hand Dominance Right   Prior Therapy none     Precautions   Precautions Other (comment)  at risk for lymphedema     Restrictions   Weight Bearing Restrictions No     Balance Screen   Has the patient fallen in the past 6 months No   Has the patient had a decrease in activity level because of a fear of falling?  No   Is the patient reluctant to leave their home because of a fear of falling?  No     Home Environment   Living Environment Private residence   Living Arrangements Spouse/significant other;Children   Available Help at Discharge Family   Type of 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   Leisure pt states she doesn't exercise     Cognition   Overall Cognitive Status Within Functional Limits for tasks assessed     Observation/Other Assessments   Other Surveys  --  LLIS: 19% impairment     AROM   AROM Assessment Site Shoulder   Right Shoulder Flexion 173 Degrees   Right Shoulder ABduction 165 Degrees   Right Shoulder Internal Rotation 41 Degrees   Right Shoulder External Rotation 90 Degrees   Left Shoulder Flexion 136 Degrees   Left Shoulder ABduction 104 Degrees   Left Shoulder Internal Rotation 19 Degrees   Left Shoulder External Rotation 90 Degrees           LYMPHEDEMA/ONCOLOGY QUESTIONNAIRE - 05/31/16 0952       Type   Cancer Type left breast cancer     Surgeries   Mastectomy Date 12/09/15   Sentinel Lymph Node Biopsy Date 12/09/15   Number Lymph Nodes Removed --  pt is unsure     Date Lymphedema/Swelling Started   Date 05/24/16     Treatment   Active Chemotherapy Treatment No   Past Chemotherapy Treatment Yes   Active Radiation Treatment No  06/02/16   Past Radiation Treatment No   Current Hormone Treatment No   Past Hormone Therapy No     What other symptoms do you have   Are you Having Heaviness or Tightness Yes   Are you having Pain Yes   Are you having pitting edema No   Is it Hard or Difficult finding clothes that fit No   Do you have infections No   Is there Decreased scar mobility Yes     Lymphedema Assessments   Lymphedema Assessments Upper extremities     Right Upper Extremity Lymphedema   15 cm Proximal to Olecranon Process 30.4 cm   Olecranon Process 24.9 cm   15 cm Proximal to Ulnar Styloid Process 24.5 cm   10 cm Proximal to Ulnar Styloid Process 20.6 cm   Just Proximal to Ulnar Styloid Process 15.9 cm   Across Hand at [employer] 18.4 cm   At [place] of 2nd Digit 6.1 cm     Left Upper Extremity Lymphedema   15 cm Proximal to Olecranon Process 31 cm   Olecranon Process 25.1 cm   15 cm Proximal to Ulnar Styloid Process 24.5 cm   10 cm Proximal to Ulnar Styloid Process 21 cm   Just Proximal to Ulnar Styloid Process 16.2 cm   Across Hand at [employer] 18.5 cm   At [place] of 2nd Digit 6.2 cm                [facility] Adult PT Treatment/Exercise - 05/31/16 0001      Shoulder Exercises: Supine   Flexion AAROM;Both;5 reps;Other (comment)  dowel   ABduction AAROM;Left;5 reps;Other (comment)  dowel                PT Education - 05/31/16 1036    Education provided Yes   Education Details anatomy and physiology of lymphatic system, briefly discussed lymphedema risk reduction   Person(s) Educated Patient   Methods Explanation    Comprehension Verbalized understanding                [facility] Goals - 05/31/16 1031      CC Long Term Goal  #1   Title Patient to receive appropriate compression garment from  DME supplier.    Time 4   Period Weeks   Status New     CC Long Term Goal  #2   Title Patient to demonstrate 160 degrees of left shoulder abduction to allow her to reach items out to sides.    Baseline 104   Time 4   Period Weeks   Status New     CC Long Term Goal  #3   Title Patient to demonstrate 170 degrees of left shoulder flexion to allow her to reach items out to sides.   Baseline 136   Time 4   Period Weeks   Status New     CC Long Term Goal  #4   Title Patient to be able to independently verbalize lymphedema risk reduction practices   Time 4   Period Weeks   Status New            Plan - 05/31/16 1023    Clinical Impression Statement Patient presents to PT with decreased left shoulder range of motion and localized swelling of left forearm following left mastectomy. Patient is going to begin radiation on Thursday. Overall there is little measureable difference between R and L UEs. Patient does not report heaviness or tightness in her left upper extremity but there is visible swelling on posterior surface of left wrist. Patient also demonstrates cording in left axilla and during ROM feels pulling down into her left antecubital fossa. Patient would benefit from skilled PT services for ROM, strengthening, scar mobilization, and edema management.    Rehab Potential Good   Clinical Impairments Affecting Rehab Potential pt to begin radiation on Thursday   PT Frequency 2x / week   PT Duration 4 weeks   PT Treatment/Interventions Prosthetic Training;Therapeutic exercise;Patient/family education;Manual techniques;Manual lymph drainage;Compression bandaging;Scar mobilization;Passive range of motion;Vasopneumatic Device;Taping;ADLs/Self Care Home Management   PT Next Visit Plan educate  patient on effects of radiation on lymphedema, begin process of getting pt compression sleeve, myofascial to L axilla, PROM/AROM/AAROM to left shoulder, lymphedema risk reduction practices   PT Home Exercise Plan supine dowel exercises   Consulted and Agree with Plan of Care Patient      Patient will benefit from skilled therapeutic intervention in order to improve the following deficits and impairments:  Decreased knowledge of use of DME, Increased fascial restricitons, Pain, Decreased scar mobility, Impaired UE functional use, Decreased strength, Decreased range of motion, Decreased knowledge of precautions, Increased edema  Visit Diagnosis: Stiffness of left shoulder, not elsewhere classified - Plan: PT plan of care cert/re-cert, CANCELED: PT plan of care cert/re-cert  Acute pain of left shoulder - Plan: PT plan of care cert/re-cert, CANCELED: PT plan of care cert/re-cert  Localized edema - Plan: PT plan of care cert/re-cert, CANCELED: PT plan of care cert/re-cert     Problem List Patient Active Problem List   Diagnosis Date Noted  . Genetic testing 12/20/2015  . S/P mastectomy 12/09/2015  . Family history of uterine cancer 10/29/2015  . Iron deficiency anemia 10/27/2015  . Cancer of central portion of left female breast ([place]) 10/22/2015  . Cephalalgia 08/26/2015    Alexia Freestone 05/31/2016, 12:08 PM  [place], [place], [address] Phone: [phone]   Fax:  [phone]  Name: Michele Salas MRN: [id number] Date of Birth: 07/02/1981  Allyson Sabal, PT 05/31/16 12:08 PM

## 2016-05-31 NOTE — Patient Instructions (Signed)
Shoulder: Flexion (Supine)    With hands shoulder width apart, slowly lower dowel to floor behind head. Do not let elbows bend. Keep back flat. Hold _10___ seconds. Repeat _10___ times. Do _2___ sessions per day. CAUTION: Stretch slowly and gently. Con las manos separadas a distancia de los hombros, suba despacio el palo hasta la altura de la cabeza. No doble los codos. La espalda pegada al suelo. Aguante la posicion 10 segundos. Repita 10 veces. Hagalo 2 veces al dia. PRECAUCION: Estire lentamente y con cuidado. Copyright  VHI. All rights reserved.  Shoulder: Abduction (Supine)    With left arm flat on floor, hold dowel in palm. Slowly move arm up to side of head by pushing with opposite arm. Do not let elbow bend. Hold __10__ seconds. Repeat __10__ times. Do __2__ sessions per day. CAUTION: Stretch slowly and gently. Con  El brazo izquierdo apoyado en el suelo, aguante el palo con la palma de la [place]. Mueva lentamente el brazo hacia arriba hacia la cabeza ayudandose del brazo derecho. No doble el codo. Aguante 10 segundos. Repita 10 veces. Hagalo 2 veces al dia. Estire lentamente y con cuidado. Copyright  VHI. All rights reserved.

## 2016-06-01 ENCOUNTER — Ambulatory Visit: Payer: Self-pay

## 2016-06-01 ENCOUNTER — Ambulatory Visit
Admission: RE | Admit: 2016-06-01 | Discharge: 2016-06-01 | Disposition: A | Discharge status: Home / Self Care | Payer: Self-pay | Source: Ambulatory Visit | Attending: Radiation Oncology | Admitting: Radiation Oncology

## 2016-06-01 DIAGNOSIS — M25612 Stiffness of left shoulder, not elsewhere classified: Secondary | ICD-10-CM

## 2016-06-01 DIAGNOSIS — M25512 Pain in left shoulder: Secondary | ICD-10-CM

## 2016-06-01 DIAGNOSIS — R600 Localized edema: Secondary | ICD-10-CM

## 2016-06-01 NOTE — Therapy (Signed)
[place], [place], [address] Phone: [phone]   Fax:  [phone]  Physical Therapy Treatment  Patient Details  Name: Michele Salas MRN: [id number] Date of Birth: [birth year] Referring Provider: Hayden Pedro  Encounter Date: 06/01/2016      PT End of Session - 06/01/16 1712    Visit Number 2   Number of Visits 9   Date for PT Re-Evaluation 06/28/16   PT Start Time 9211   PT Stop Time 1651   PT Time Calculation (min) 44 min   Activity Tolerance Patient tolerated treatment well   Behavior During Therapy [facility] for tasks assessed/performed      Past Medical History:  Diagnosis Date  . Breast cancer, left ([facility]) 10/22/2015  . GERD (gastroesophageal reflux disease)   . Iron deficiency anemia 10/27/2015    Past Surgical History:  Procedure Laterality Date  . MASTECTOMY MODIFIED RADICAL Left 12/09/2015   Procedure: LEFT MODIFIED RADICAL MASTECTOMY;  Surgeon: Aviva Signs, MD;  Location: [facility];  Service: General;  Laterality: Left;  . PORTACATH PLACEMENT Right 12/09/2015   Procedure: INSERTION PORT-A-CATH RIGHT SUBCLAVIAN;  Surgeon: Aviva Signs, MD;  Location: [facility];  Service: General;  Laterality: Right;    There were no vitals filed for this visit.      Subjective Assessment - 06/01/16 1610    Subjective Radiation starts tomorrow. Nothing new since yesterday.    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 - ASSOCIATED HIGH GRADE DCIS - MARGINS ARE NEGATIVE. 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 - EIGHT ADDITIONAL LYMPH NODES WITH NO TUMOR SEEN, HER2 NEGATIVE, ER 0%, PR 0%, Ki-67 marker 90%, left mastectomy, completed chemo, going to begin radiation 06/02/16   Patient Stated Goals I really don't know what it is   Currently in Pain? No/denies               LYMPHEDEMA/ONCOLOGY QUESTIONNAIRE - 05/31/16  0952      Type   Cancer Type left breast cancer     Surgeries   Mastectomy Date 12/09/15   Sentinel Lymph Node Biopsy Date 12/09/15   Number Lymph Nodes Removed --  pt is unsure     Date Lymphedema/Swelling Started   Date 05/24/16     Treatment   Active Chemotherapy Treatment No   Past Chemotherapy Treatment Yes   Active Radiation Treatment No  06/02/16   Past Radiation Treatment No   Current Hormone Treatment No   Past Hormone Therapy No     What other symptoms do you have   Are you Having Heaviness or Tightness Yes   Are you having Pain Yes   Are you having pitting edema No   Is it Hard or Difficult finding clothes that fit No   Do you have infections No   Is there Decreased scar mobility Yes     Lymphedema Assessments   Lymphedema Assessments Upper extremities     Right Upper Extremity Lymphedema   15 cm Proximal to Olecranon Process 30.4 cm   Olecranon Process 24.9 cm   15 cm Proximal to Ulnar Styloid Process 24.5 cm   10 cm Proximal to Ulnar Styloid Process 20.6 cm   Just Proximal to Ulnar Styloid Process 15.9 cm   Across Hand at [employer] 18.4 cm   At [place] of 2nd Digit 6.1 cm     Left Upper Extremity Lymphedema   15  cm Proximal to Olecranon Process 31 cm   Olecranon Process 25.1 cm   15 cm Proximal to Ulnar Styloid Process 24.5 cm   10 cm Proximal to Ulnar Styloid Process 21 cm   Just Proximal to Ulnar Styloid Process 16.2 cm   Across Hand at [employer] 18.5 cm   At [place] of 2nd Digit 6.2 cm                  [facility] Adult PT Treatment/Exercise - 06/01/16 0001      Shoulder Exercises: Pulleys   Flexion 2 minutes   Flexion Limitations VC and demonstration throughout to decrease scapular compensations/relax bil upper traps     Shoulder Exercises: Therapy Ball   Flexion 5 reps  With forward lean into top of stretch   Flexion Limitations VC and demonstration for correct technique      Shoulder Exercises: ROM/Strengthening   Other  ROM/Strengthening Exercises Finger Ladder for Lt UE abduction 5 times (up to #18) with therapist providing VC to decrease scapular compensation     Manual Therapy   Myofascial Release To Lt chest wall during P/ROM in opposite direction of UE abduction stretch   Manual Lymphatic Drainage (MLD) In Supine: Short neck, superficial abdominals and 5 diaphragmatic breaths, Lt inguinal nodes and Lt axillo-inguinal anastomosis, Rt axillary nodes, anterior inter-axillary anastomosis, then Lt UE from dorsal hand to lateral shoulder focusing on forearm, working proximal to distal then retracing all steps.    Passive ROM In Supine to Lt shoulder into flexion, abduction with er (for radiation positioning), and just er all to pts tolerance.   Neural Stretch To Lt UE at 45 and 60 degrees of abduction (90 degrees very uncomfortable for pt).                PT Education - 06/01/16 1710    Education provided Yes   Education Details Encouraged pt to cont Lt UE A/ROM exercises she was issued yesterday throughout radiation and to be watchful of her UE swelling as this could increase during radiation.    Person(s) Educated Patient   Methods Explanation;Demonstration;Other (comment)  Interpreter present   Comprehension Verbalized understanding;Need further instruction                [facility] Goals - 05/31/16 1031      CC Long Term Goal  #1   Title Patient to receive appropriate compression garment from DME supplier.    Time 4   Period Weeks   Status New     CC Long Term Goal  #2   Title Patient to demonstrate 160 degrees of left shoulder abduction to allow her to reach items out to sides.    Baseline 104   Time 4   Period Weeks   Status New     CC Long Term Goal  #3   Title Patient to demonstrate 170 degrees of left shoulder flexion to allow her to reach items out to sides.   Baseline 136   Time 4   Period Weeks   Status New     CC Long Term Goal  #4   Title Patient to be  able to independently verbalize lymphedema risk reduction practices   Time 4   Period Weeks   Status New            Plan - 06/01/16 1712    Clinical Impression Statement Overall pt tolerated first session of stretching and manual therapy very well. She  had some pain during stretching which went away after UE wsa no longer in that position and she reported feeling "different, it feels looser" in her Lt UE and chest at end of session. Instruced pt in importance of continuing to stretch during the upcoming weeks she is receiving radiation as this could cause her to get tighter, as well as could increase the swelling in her UE as they will probably be targeting her axilla as well in the radiation field since she had positive lymph nodes. Pt, with assistance of interpreter, verbalized understanding this. Her AA/ROM visually improved by end of stretching in gym as well.    Rehab Potential Good   Clinical Impairments Affecting Rehab Potential pt to begin radiation on Thursday   PT Frequency 2x / week   PT Duration 4 weeks   PT Treatment/Interventions Prosthetic Training;Therapeutic exercise;Patient/family education;Manual techniques;Manual lymph drainage;Compression bandaging;Scar mobilization;Passive range of motion;Vasopneumatic Device;Taping;ADLs/Self Care Home Management   PT Next Visit Plan Begin process of getting pt compression sleeve, cont myofascial to Lt axilla, PROM/AROM/AAROM to left shoulder, lymphedema risk reduction practices   Consulted and Agree with Plan of Care Patient      Patient will benefit from skilled therapeutic intervention in order to improve the following deficits and impairments:  Decreased knowledge of use of DME, Increased fascial restricitons, Pain, Decreased scar mobility, Impaired UE functional use, Decreased strength, Decreased range of motion, Decreased knowledge of precautions, Increased edema  Visit Diagnosis: Stiffness of left shoulder, not elsewhere  classified  Acute pain of left shoulder  Localized edema     Problem List Patient Active Problem List   Diagnosis Date Noted  . Genetic testing 12/20/2015  . S/P mastectomy 12/09/2015  . Family history of uterine cancer 10/29/2015  . Iron deficiency anemia 10/27/2015  . Cancer of central portion of left female breast ([place]) 10/22/2015  . Cephalalgia 08/26/2015    Otelia Limes, PTA 06/01/2016, 5:22 PM  [place], [place], [address] Phone: [phone]   Fax:  [phone]  Name: Michele Salas MRN: [id number] Date of Birth: July 29, 1981

## 2016-06-02 ENCOUNTER — Ambulatory Visit
Admission: RE | Admit: 2016-06-02 | Discharge: 2016-06-02 | Disposition: A | Discharge status: Home / Self Care | Payer: Self-pay | Source: Ambulatory Visit | Attending: Radiation Oncology | Admitting: Radiation Oncology

## 2016-06-02 ENCOUNTER — Encounter (HOSPITAL_BASED_OUTPATIENT_CLINIC_OR_DEPARTMENT_OTHER): Payer: Self-pay | Admitting: Hematology & Oncology

## 2016-06-02 ENCOUNTER — Encounter (HOSPITAL_COMMUNITY): Payer: Self-pay | Admitting: Hematology & Oncology

## 2016-06-02 VITALS — BP 143/83 | HR 86 | Temp 98.3°F | Resp 16 | Wt 147.2 lb

## 2016-06-02 DIAGNOSIS — R232 Flushing: Secondary | ICD-10-CM

## 2016-06-02 DIAGNOSIS — C50112 Malignant neoplasm of central portion of left female breast: Secondary | ICD-10-CM | POA: Insufficient documentation

## 2016-06-02 DIAGNOSIS — C773 Secondary and unspecified malignant neoplasm of axilla and upper limb lymph nodes: Secondary | ICD-10-CM

## 2016-06-02 DIAGNOSIS — R740 Nonspecific elevation of levels of transaminase and lactic acid dehydrogenase [LDH]: Secondary | ICD-10-CM

## 2016-06-02 DIAGNOSIS — R7401 Elevation of levels of liver transaminase levels: Secondary | ICD-10-CM

## 2016-06-02 DIAGNOSIS — Z171 Estrogen receptor negative status [ER-]: Secondary | ICD-10-CM

## 2016-06-02 DIAGNOSIS — D500 Iron deficiency anemia secondary to blood loss (chronic): Secondary | ICD-10-CM

## 2016-06-02 DIAGNOSIS — C50919 Malignant neoplasm of unspecified site of unspecified female breast: Secondary | ICD-10-CM

## 2016-06-02 MED ORDER — RADIAPLEXRX EX GEL
Freq: Once | CUTANEOUS | Status: AC
  Administered 2016-06-02: 18:00:00 via TOPICAL

## 2016-06-02 MED ORDER — ALRA NON-METALLIC DEODORANT (RAD-ONC)
1.0000 "application " | Freq: Once | TOPICAL | Status: AC
  Administered 2016-06-02: 1 via TOPICAL

## 2016-06-02 NOTE — Progress Notes (Addendum)
Patient education done, Interpreter Cresenciano Genre and spouse,  radiation therapy and you book,my business card, alra deodorant,radiaplex gel given , discussed ways to manage side effects, fatigue, skin irritation, pain, swelling/tenderness breast, increase protein in diet, stay hydrated,drink plenty water, use of electric razor on underarm, no under wire bra , apply skin cream after rad txs and bedtime daily, verbal understanding, teach back

## 2016-06-02 NOTE — Progress Notes (Signed)
[facility] Hematology/Oncology Progress Note  Name: Michele Salas      MRN: [id number]    Date: 06/02/2016 Time:11:01 AM   REFERRING PHYSICIAN:  Aviva Signs, MD (Gen Surg)  REASON FOR CONSULT:  Left sided breast cancer   DIAGNOSIS:  Invasive ductal carcinoma, high grade, of left breast.  HISTORY OF PRESENT ILLNESS:      Cancer of central portion of left female breast ([place])   09/16/2015 Mammogram    1.6 cm retroareolar left breast mass measuring 1.6 cm in largest diameter in addition to 3 abnormal appearing left inferior axillary lymph nodes.      09/23/2015 Initial Biopsy    US guided core biopsy of left breast mass and suspicious left axillary lymph nodes.      09/23/2015 Pathology Results    Invasive ductal carcinoma, high grade.  Left axilla lymph node is positive for metastatic carcinoma.  ER/PR NEGATIVE, HER2 NEGATIVE, KI-67 marker of 85%.      10/26/2015 Imaging    Bone scan- No evidence of metastatic disease to the skeleton.      10/27/2015 Imaging    CT CAP-  Retroareolar mass with 2 asymmetrically prominent left axillary lymph nodes and an upper normal size left subpectoral lymph node. No other findings of metastatic disease in the chest, abdomen, or pelvis.      11/05/2015 Echocardiogram    The estimated ejection fraction was in the range of 60% to 65%.      11/23/2015 Miscellaneous    Genetic Counseling- genetic test result was negative for any known pathogenic mutations within any of 28 genes that would cause her to be at an increased genetic risk for breast, ovarian, or other related cancers.      12/09/2015 Surgery    L radical mastectomy, axillary LN dissection, port a cath insertion with Dr. Aviva Signs 12/09/2015      12/11/2015 Pathology Results    - INVASIVE GRADE III DUCTAL CARCINOMA, SPANNING 2.3 CM IN GREATEST DIMENSION. - ASSOCIATED HIGH GRADE DCIS - MARGINS ARE NEGATIVE. - TWO LYMPH NODES POSITIVE FOR METASTATIC DUCTAL CARCINOMA  (2/2). - EIGHT ADDITIONAL LYMPH NODES WITH NO TUMOR SEEN      12/11/2015 Pathology Results    HER2 NEGATIVE, ER 0%, PR 0%, Ki-67 marker 90%      12/25/2015 - 02/12/2016 Chemotherapy    AC every 2 weeks x 4 cycles      02/04/2016 Treatment Plan Change    Treatment deferred x 7 days due to neutropenia      02/25/2016 -  Chemotherapy    Paclitaxel weekly x 12      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   She is accompanied by her husband and friend, who is translating for her.   Patient has completed chemotherapy and begins radiation today. She will continue radiation for 6 weeks (until July 19, 2016). After she finishes radiation, [place] would like to discuss reconstruction.   Patient reports hot flashes. She denies abdominal pain. She has some difficulty lifting her arm, due to stiffness. She will begin physical therapy soon.   Her friend asked when she would have her port removed. Appetite is good, no nausea, no vomiting, no diarrhea or constipation. Denies neuropathy.   Review of Systems  Constitutional: Negative for chills, diaphoresis, fever, malaise/fatigue and weight loss.  HENT: Negative for congestion  and sore throat.   Eyes: Negative for blurred vision, double vision and redness.  Respiratory: Negative for cough, hemoptysis, sputum production, shortness of breath and wheezing.   Cardiovascular: Negative for chest pain, palpitations and leg swelling.  Gastrointestinal: Negative for abdominal pain, blood in stool, constipation, diarrhea, heartburn, melena and vomiting.  Genitourinary: Negative for dysuria, frequency and hematuria.  Musculoskeletal: Negative for back pain, falls, joint pain, myalgias and neck pain.  Skin: Negative for itching and rash.  Neurological: Negative for dizziness, tingling, tremors, sensory change, speech change, focal weakness and weakness.   Endo/Heme/Allergies: Negative.        Hot flashes  Psychiatric/Behavioral: Negative for depression, hallucinations, substance abuse and suicidal ideas. The patient is not nervous/anxious and does not have insomnia.   14 point review of systems was performed and is negative except as detailed under history of present illness and above  HORMONAL RISK FACTORS:  Menarche was at age 49.  First live birth at age 35.  OCP use for approximately not sure how long she took birth control for, but reports that she has not taken in 43 years.  Ovaries intact: yes.  Hysterectomy: no.  Menopausal status: premenopausal, but periods are irregular. HRT use: 0 years. Colonoscopy: n/a; not examined. Mammogram within the last year: this was her first mammogram. Number of breast biopsies: 1. Up to date with pelvic exams: yes. Any excessive radiation exposure in the past: no   PAST MEDICAL HISTORY:   Past Medical History:  Diagnosis Date  . Breast cancer, left ([place]) 10/22/2015  . GERD (gastroesophageal reflux disease)   . Iron deficiency anemia 10/27/2015    ALLERGIES: No Known Allergies    MEDICATIONS: I have reviewed the patient's current medications.    Current Outpatient Prescriptions on File Prior to Visit  Medication Sig Dispense Refill  . lidocaine-prilocaine (EMLA) cream Apply a quarter size amount to port site 1 hour prior to chemo. Do not rub in. Cover with plastic wrap. 30 g 3  . omeprazole (PRILOSEC) 40 MG capsule Take 1 capsule (40 mg total) by mouth daily. 30 capsule 3  . ondansetron (ZOFRAN) 8 MG tablet Take 1 tablet (8 mg total) by mouth every 8 (eight) hours as needed for nausea or vomiting. 30 tablet 2  . oxyCODONE-acetaminophen (PERCOCET/ROXICET) 5-325 MG tablet Take 1-2 tablets by mouth every 4 (four) hours as needed for moderate pain. 40 tablet 0  . Pegfilgrastim (NEULASTA ONPRO [place]) Inject into the skin. To be given 27 hours after the completion of chemo.    . potassium  chloride SA (K-DUR,KLOR-CON) 20 MEQ tablet Take 0.5 tablets (10 mEq total) by mouth daily. 30 tablet 1  . prochlorperazine (COMPAZINE) 10 MG tablet Take 1 tablet (10 mg total) by mouth every 6 (six) hours as needed for nausea or vomiting. 30 tablet 2  . propranolol (INDERAL) 20 MG tablet 1 po bid. Tome una tableta por boca dos veces diarias 60 tablet 3   No current facility-administered medications on file prior to visit.      PAST SURGICAL HISTORY Past Surgical History:  Procedure Laterality Date  . MASTECTOMY MODIFIED RADICAL Left 12/09/2015   Procedure: LEFT MODIFIED RADICAL MASTECTOMY;  Surgeon: Aviva Signs, MD;  Location: [facility];  Service: General;  Laterality: Left;  . PORTACATH PLACEMENT Right 12/09/2015   Procedure: INSERTION PORT-A-CATH RIGHT SUBCLAVIAN;  Surgeon: Aviva Signs, MD;  Location: [facility];  Service: General;  Laterality: Right;    FAMILY HISTORY: Family History  Problem Relation Age  of Onset  . Diabetes Mother   . Uterine cancer Paternal Aunt 20  . Heart attack Paternal Uncle     unspecified age  . Heart attack Paternal Grandfather 67  . Other Paternal Uncle     stomach swollen with fluid that had to be removed; lim info  Mother is alive at the age of 60. She has hypertension and diabetes. She lives in [place]. Father is deceased at unknown age and for unknown reason. She reports "he was old." Brothers 64 years old and healthy. She has many half siblings, many of whom live in [place]. She has 3 children. She is a 60 year old daughter who lives in [place], 9 year old son who lives here in [place], and 47 year old daughter who lives in [place]. All children are healthy.  SOCIAL HISTORY: Patient denies any tobacco abuse. Rarely does she have alcoholic beverage. She denies any illicit drug abuse. 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 She is religious and Catholic. She is not a documented citizen of the [place]. She is  married.  PERFORMANCE STATUS: The patient's performance status is 0 - Asymptomatic Vitals with BMI 06/02/2016  Height   Weight 147 lbs 3 oz  BMI   Systolic 409  Diastolic 83  Pulse 86  Respirations 16   PHYSICAL EXAM: General appearance: alert, cooperative, appears stated age, no distress and Hispanic, non-english speaking,  Alopecia Head: Normocephalic, without obvious abnormality, atraumatic Eyes: negative findings: lids and lashes normal, conjunctivae and sclerae normal and corneas clear Throat: lips, mucosa, and tongue normal; teeth and gums normal Neck: no adenopathy, supple, symmetrical, trachea midline and thyroid not enlarged, symmetric, no tenderness/mass/nodules Lungs: clear to auscultation bilaterally and normal percussion bilaterally Breasts: Post mastectomy site is well healed Heart: regular rate and rhythm, S1, S2 normal, no murmur, click, rub or gallop Abdomen: soft, non-tender; bowel sounds normal; no masses,  no organomegaly Extremities: extremities normal, atraumatic, no cyanosis or edema Skin: Skin color, texture, turgor normal. No rashes or lesions Lymph nodes: Axillary adenopathy: left anterior line adenopathy Neurologic: Alert and oriented X 3, normal strength and tone. Normal symmetric reflexes. Normal coordination and gait  LABORATORY DATA:  I have reviewed the data as listed.   Results for NEWELL, FRATER (MRN [id number]) as of 06/02/2016 14:37  Ref. Range 05/12/2016 11:37  Sodium Latest Ref Range: 135 - 145 mmol/L 138  Potassium Latest Ref Range: 3.5 - 5.1 mmol/L 3.6  Chloride Latest Ref Range: 101 - 111 mmol/L 107  CO2 Latest Ref Range: 22 - 32 mmol/L 24  BUN Latest Ref Range: 6 - 20 mg/dL 6  Creatinine Latest Ref Range: 0.44 - 1.00 mg/dL 0.50  Calcium Latest Ref Range: 8.9 - 10.3 mg/dL 9.3  EGFR (Non-African Amer.) Latest Ref Range: >60 mL/min >60  EGFR (African American) Latest Ref Range: >60 mL/min >60  Glucose Latest Ref Range: 65 - 99 mg/dL  129 (H)  Anion gap Latest Ref Range: 5 - 15  7  Alkaline Phosphatase Latest Ref Range: 38 - 126 U/L 79  Albumin Latest Ref Range: 3.5 - 5.0 g/dL 4.1  AST Latest Ref Range: 15 - 41 U/L 75 (H)  ALT Latest Ref Range: 14 - 54 U/L 133 (H)  Total Protein Latest Ref Range: 6.5 - 8.1 g/dL 6.9  Total Bilirubin Latest Ref Range: 0.3 - 1.2 mg/dL 0.5  WBC Latest Ref Range: 4.0 - 10.5 K/uL 5.1  RBC Latest Ref Range: 3.87 - 5.11 MIL/uL 3.78 (L)  Hemoglobin Latest Ref Range: 12.0 - 15.0 g/dL 11.1 (L)  HCT Latest Ref Range: 36.0 - 46.0 % 33.5 (L)  MCV Latest Ref Range: 78.0 - 100.0 fL 88.6  MCH Latest Ref Range: 26.0 - 34.0 pg 29.4  MCHC Latest Ref Range: 30.0 - 36.0 g/dL 33.1  RDW Latest Ref Range: 11.5 - 15.5 % 15.5  Platelets Latest Ref Range: 150 - 400 K/uL 374  Neutrophils Latest Units: % 66  Lymphocytes Latest Units: % 22  Monocytes Relative Latest Units: % 10  Eosinophil Latest Units: % 2  Basophil Latest Units: % 0  NEUT# Latest Ref Range: 1.7 - 7.7 K/uL 3.4  Lymphocyte # Latest Ref Range: 0.7 - 4.0 K/uL 1.1  Monocyte # Latest Ref Range: 0.1 - 1.0 K/uL 0.5  Eosinophils Absolute Latest Ref Range: 0.0 - 0.7 K/uL 0.1  Basophils Absolute Latest Ref Range: 0.0 - 0.1 K/uL 0.0    RADIOGRAPHY: I have personally reviewed the radiological images as listed and agreed with the findings in the report. Study Result     CLINICAL DATA: Left breast cancer  EXAM: CHEST 2 VIEW  COMPARISON: CT 10/27/2015  FINDINGS: Calcified granuloma in the right upper lobe, stable. Heart is normal size. No confluent opacities or effusions. No acute bony abnormality.  IMPRESSION: No active cardiopulmonary disease.   Electronically Signed  By: Rolm Baptise M.D.  On: 12/04/2015 11:53    PATHOLOGY:      Pathology from [facility]:  Biopsy of left breast mass at the 12:00 position demonstrates invasive ductal carcinoma, high-grade.  Left axillary lymph node biopsy demonstrates invasive  ductal carcinoma, high-grade, consistent with metastasis to lymph node. Breast prognostic panel is performed demonstrating ER/PR negativity, HER-2/neu negativity, and a KI-67 Marker of 85%.    ASSESSMENT/PLAN:  Iron deficiency anemia She did well with IV iron replacement. She was severely iron deficiency and iron stores were replaced in preparation for chemotherapy. Iron levels will be followed.  Cancer of central portion of left female breast ([place]) STAGE IIB triple negative carcinoma of L breast Treatment related nausea without vomiting Bandemia Transaminitis Hot Flashes  Patient is pleasant and well. She completed chemotherapy and has now started radiation. We will follow her LFT's now that she has completed chemotherapy, I suspect that her mild transaminitis was treatment induced.   I told her that after she finishes radiation, we can refer her to plastic surgery for discussion of reconstruction. We can also discuss having her port removed at that time if she wishes.     Follow up with patient in 8 weeks, after she has completed radiation.   Orders Placed This Encounter  Procedures  . CBC with Differential    Standing Status:   Future    Standing Expiration Date:   06/02/2017  . Comprehensive metabolic panel    Standing Status:   Future    Standing Expiration Date:   06/02/2017  . Follicle stimulating hormone    Standing Status:   Future    Standing Expiration Date:   06/02/2017  . Estradiol    Standing Status:   Future    Standing Expiration Date:   06/02/2017    All questions were answered. The patient knows to call the clinic with any problems, questions or concerns. We can certainly see the patient much sooner if necessary.  This document serves as a record of services personally performed by Ancil Linsey, MD. It was created on her behalf by Elmyra Ricks, a trained medical scribe. The  creation of this record is based on the scribe's personal observations and the  provider's statements to them. This document has been checked and approved by the attending provider..  I have reviewed the above documentation for accuracy and completeness, and I agree with the above.   This note is electronically signed by: Molli Hazard, MD   06/02/2016 11:01 AM

## 2016-06-02 NOTE — Patient Instructions (Addendum)
[place] [facility] Discharge Instructions  RECOMMENDATIONS MADE BY THE CONSULTANT AND ANY TEST RESULTS WILL BE SENT TO YOUR REFERRING PHYSICIAN.  You saw Dr.Penland today. Follow up first week of December with lab work. See Amy at checkout for appointments.  Thank you for choosing [place] at [facility] to provide your oncology and hematology care.  To afford each patient quality time with our provider, please arrive at least 15 minutes before your scheduled appointment time.   Beginning January 23rd 2017 lab work for the [facility] will be done in the  Main lab at [facility] on 1st floor. If you have a lab appointment with the [facility] please come in thru the  Main Entrance and check in at the main information desk  You need to re-schedule your appointment should you arrive 10 or more minutes late.  We strive to give you quality time with our providers, and arriving late affects you and other patients whose appointments are after yours.  Also, if you no show three or more times for appointments you may be dismissed from the clinic at the providers discretion.     Again, thank you for choosing [facility].  Our hope is that these requests will decrease the amount of time that you wait before being seen by our physicians.       _____________________________________________________________  Should you have questions after your visit to [facility], please contact our office at [phone] between the hours of 8:30 a.m. and 4:30 p.m.  Voicemails left after 4:30 p.m. will not be returned until the following business day.  For prescription refill requests, have your pharmacy contact our office.         Resources For Cancer Patients and their Caregivers ? American Cancer Society: Can assist with transportation, wigs, general needs, runs Look Good Feel Better.        445-160-5169 ? Cancer  Care: Provides financial assistance, online support groups, medication/co-pay assistance.  1-800-813-HOPE 734-330-9073) ? [place] Assists [place] cancer patients and their families through emotional , educational and financial support.  5062812906 ? [place] DSS Where to apply for food stamps, Medicaid and utility assistance. 936-230-3435 ? [facility]: Transportation to medical appointments. (949)111-3811 ? Social Security Administration: May apply for disability if have a Stage IV cancer. (714)419-8306 458-717-0368 ? [employer], Disability and Transit Services: Assists with nutrition, care and transit needs. [place] Support Programs: @10RELATIVEDAYS @ > Cancer Support Group  2nd Tuesday of the month 1pm-2pm, Journey Room  > Creative Journey  3rd Tuesday of the month 1130am-1pm, Journey Room  > Look Good Feel Better  1st Wednesday of the month 10am-12 noon, Journey Room (Call Ivor to register [phone])

## 2016-06-03 ENCOUNTER — Ambulatory Visit
Admission: RE | Admit: 2016-06-03 | Discharge: 2016-06-03 | Disposition: A | Discharge status: Home / Self Care | Payer: Self-pay | Source: Ambulatory Visit | Attending: Radiation Oncology | Admitting: Radiation Oncology

## 2016-06-03 ENCOUNTER — Inpatient Hospital Stay
Admission: RE | Admit: 2016-06-03 | Discharge: 2016-06-03 | Disposition: A | Discharge status: Home / Self Care | Payer: Self-pay | Source: Ambulatory Visit | Attending: Radiation Oncology | Admitting: Radiation Oncology

## 2016-06-03 VITALS — BP 119/82 | HR 85 | Resp 16 | Wt 147.4 lb

## 2016-06-03 DIAGNOSIS — Z171 Estrogen receptor negative status [ER-]: Secondary | ICD-10-CM

## 2016-06-03 DIAGNOSIS — C50112 Malignant neoplasm of central portion of left female breast: Secondary | ICD-10-CM

## 2016-06-03 NOTE — Progress Notes (Signed)
Department of Radiation Oncology  Phone:  [phone] Fax:        [phone]  Weekly Treatment Note    Name: Michele Salas Date: 06/03/2016 MRN: [id number] DOB: 1980-09-02   Diagnosis:     ICD-9-CM ICD-10-CM   1. Malignant neoplasm of central portion of left breast in female, estrogen receptor negative (HCC) 174.1 C50.112    V86.1 Z17.1      Current dose: 3.6 Gy  Current fraction: 2   MEDICATIONS: Current Outpatient Prescriptions  Medication Sig Dispense Refill  . hyaluronate sodium (RADIAPLEXRX) GEL Apply 1 application topically 2 (two) times daily.    Marland Kitchen lidocaine-prilocaine (EMLA) cream Apply a quarter size amount to port site 1 hour prior to chemo. Do not rub in. Cover with plastic wrap. 30 g 3  . non-metallic deodorant (ALRA) MISC Apply 1 application topically daily as needed.    Marland Kitchen omeprazole (PRILOSEC) 40 MG capsule Take 1 capsule (40 mg total) by mouth daily. 30 capsule 3  . ondansetron (ZOFRAN) 8 MG tablet Take 1 tablet (8 mg total) by mouth every 8 (eight) hours as needed for nausea or vomiting. 30 tablet 2  . oxyCODONE-acetaminophen (PERCOCET/ROXICET) 5-325 MG tablet Take 1-2 tablets by mouth every 4 (four) hours as needed for moderate pain. 40 tablet 0  . Pegfilgrastim (NEULASTA ONPRO [place]) Inject into the skin. To be given 27 hours after the completion of chemo.    . potassium chloride SA (K-DUR,KLOR-CON) 20 MEQ tablet Take 0.5 tablets (10 mEq total) by mouth daily. 30 tablet 1  . prochlorperazine (COMPAZINE) 10 MG tablet Take 1 tablet (10 mg total) by mouth every 6 (six) hours as needed for nausea or vomiting. 30 tablet 2  . propranolol (INDERAL) 20 MG tablet 1 po bid. Tome una tableta por boca dos veces diarias 60 tablet 3   No current facility-administered medications for this encounter.      ALLERGIES: Review of patient's allergies indicates no known allergies.   LABORATORY DATA:  Lab Results  Component Value Date   WBC 5.1 05/12/2016   HGB  11.1 (L) 05/12/2016   HCT 33.5 (L) 05/12/2016   MCV 88.6 05/12/2016   PLT 374 05/12/2016   Lab Results  Component Value Date   NA 138 05/12/2016   K 3.6 05/12/2016   CL 107 05/12/2016   CO2 24 05/12/2016   Lab Results  Component Value Date   ALT 133 (H) 05/12/2016   AST 75 (H) 05/12/2016   ALKPHOS 79 05/12/2016   BILITOT 0.5 05/12/2016     NARRATIVE: Michele Salas was seen today for weekly treatment management. The chart was checked and the patient's films were reviewed.  Weight and vitals stable. Denies pain. No evidence of lymphedema noted. No hyperpigmentation or desquamation of left chest wall noted. Reports using radiaplex as directed. Employer has threatened to fire her is she can't return to work on Thursday. 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   BP 119/82 (BP Location: Right Arm, Patient Position: Sitting, Cuff Size: Normal)   Pulse 85   Resp 16   Wt 147 lb 6.4 oz (66.9 kg)   SpO2 100%   BMI 26.96 kg/m  Wt Readings from Last 3 Encounters:  06/03/16 147 lb 6.4 oz (66.9 kg)  06/02/16 147 lb 3.2 oz (66.8 kg)  05/16/16 144 lb (65.3 kg)      PHYSICAL EXAMINATION: weight is 147 lb 6.4 oz (66.9  kg). Her blood pressure is 119/82 and her pulse is 85. Her respiration is 16 and oxygen saturation is 100%.        ASSESSMENT: The patient is doing satisfactorily with treatment.  PLAN: We will continue with the patient's radiation treatment as planned.

## 2016-06-03 NOTE — Progress Notes (Signed)
Weight and vitals stable. Denies pain. No evidence of lymphedema noted. No hyperpigmentation or desquamation of left chest wall noted. Reports using radiaplex as directed. Employer has threatened to fire Michele Salas is she can't return to work on Thursday. 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   BP 119/82 (BP Location: Right Arm, Patient Position: Sitting, Cuff Size: Normal)   Pulse 85   Resp 16   Wt 147 lb 6.4 oz (66.9 kg)   SpO2 100%   BMI 26.96 kg/m  Wt Readings from Last 3 Encounters:  06/03/16 147 lb 6.4 oz (66.9 kg)  06/02/16 147 lb 3.2 oz (66.8 kg)  05/16/16 144 lb (65.3 kg)

## 2016-06-06 ENCOUNTER — Ambulatory Visit
Admission: RE | Admit: 2016-06-06 | Discharge: 2016-06-06 | Disposition: A | Discharge status: Home / Self Care | Payer: Self-pay | Source: Ambulatory Visit | Attending: Radiation Oncology | Admitting: Radiation Oncology

## 2016-06-07 ENCOUNTER — Encounter: Payer: Self-pay | Admitting: Physical Therapy

## 2016-06-07 ENCOUNTER — Ambulatory Visit: Payer: Self-pay | Admitting: Physical Therapy

## 2016-06-07 ENCOUNTER — Ambulatory Visit
Admission: RE | Admit: 2016-06-07 | Discharge: 2016-06-07 | Disposition: A | Discharge status: Home / Self Care | Payer: Self-pay | Source: Ambulatory Visit | Attending: Radiation Oncology | Admitting: Radiation Oncology

## 2016-06-07 DIAGNOSIS — M25612 Stiffness of left shoulder, not elsewhere classified: Secondary | ICD-10-CM

## 2016-06-07 DIAGNOSIS — M25512 Pain in left shoulder: Secondary | ICD-10-CM

## 2016-06-07 NOTE — Therapy (Signed)
[place], [place], [address] Phone: [phone]   Fax:  [phone]  Physical Therapy Treatment  Patient Details  Name: Michele Salas MRN: [id number] Date of Birth: 1981-07-31 Referring Provider: Hayden Pedro  Encounter Date: 06/07/2016      PT End of Session - 06/07/16 1708    Visit Number 3   Number of Visits 9   Date for PT Re-Evaluation 06/28/16   PT Start Time 1439  interpreter arrived late to appointment   PT Stop Time 1518   PT Time Calculation (min) 39 min   Activity Tolerance Patient tolerated treatment well   Behavior During Therapy [facility] for tasks assessed/performed      Past Medical History:  Diagnosis Date  . Breast cancer, left ([place]) 10/22/2015  . GERD (gastroesophageal reflux disease)   . Iron deficiency anemia 10/27/2015    Past Surgical History:  Procedure Laterality Date  . MASTECTOMY MODIFIED RADICAL Left 12/09/2015   Procedure: LEFT MODIFIED RADICAL MASTECTOMY;  Surgeon: Aviva Signs, MD;  Location: [facility];  Service: General;  Laterality: Left;  . PORTACATH PLACEMENT Right 12/09/2015   Procedure: INSERTION PORT-A-CATH RIGHT SUBCLAVIAN;  Surgeon: Aviva Signs, MD;  Location: [facility];  Service: General;  Laterality: Right;    There were no vitals filed for this visit.      Subjective Assessment - 06/07/16 1441    Subjective I am fine today. Not much tightness in my shoulders. There is almost no swelling.   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 - ASSOCIATED HIGH GRADE DCIS - MARGINS ARE NEGATIVE. 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 - EIGHT ADDITIONAL LYMPH NODES WITH NO TUMOR SEEN, HER2 NEGATIVE, ER 0%, PR 0%, Ki-67 marker 90%, left mastectomy, completed chemo, going to begin radiation 06/02/16   Patient Stated Goals I really don't know  what it is   Currently in Pain? No/denies   Pain Score 0-No pain                         [facility] Adult PT Treatment/Exercise - 06/07/16 0001      Shoulder Exercises: Supine   Horizontal ABduction Strengthening;Both;10 reps;Theraband  yellow   External Rotation Strengthening;Both;10 reps;Theraband   Theraband Level (Shoulder External Rotation) Level 1 (Yellow)   Flexion Strengthening;Both;10 reps;Theraband  narrow and wide grip   Theraband Level (Shoulder Flexion) Level 1 (Yellow)   Other Supine Exercises D2 with yellow band x 10 reps each     Shoulder Exercises: Standing   Other Standing Exercises instructed pt in neural stretch on wall and carpal tunnel stretch in sitting (prayer hands)     Shoulder Exercises: Pulleys   Flexion 2 minutes   ABduction 2 minutes     Shoulder Exercises: Therapy Ball   Flexion 10 reps   ABduction 10 reps     Manual Therapy   Myofascial Release To Lt chest wall during P/ROM in opposite direction of UE abduction stretch   Passive ROM In Supine to Lt shoulder into flexion, abduction with er (for radiation positioning), and just er all to pts tolerance.   Neural Stretch To Lt UE at 45 and 60 degrees of abduction (90 degrees very uncomfortable for pt).                        Long  Term Clinic Goals - 05/31/16 1031      CC Long Term Goal  #1   Title Patient to receive appropriate compression garment from DME supplier.    Time 4   Period Weeks   Status New     CC Long Term Goal  #2   Title Patient to demonstrate 160 degrees of left shoulder abduction to allow her to reach items out to sides.    Baseline 104   Time 4   Period Weeks   Status New     CC Long Term Goal  #3   Title Patient to demonstrate 170 degrees of left shoulder flexion to allow her to reach items out to sides.   Baseline 136   Time 4   Period Weeks   Status New     CC Long Term Goal  #4   Title Patient to be able to independently verbalize  lymphedema risk reduction practices   Time 4   Period Weeks   Status New            Plan - 06/07/16 1709    Clinical Impression Statement Patient did well with exercises today. Her ROM has improved greatly and pt did was able to obtain further motion during PROM. Instructed patient in supine scapular stabilization exercises today and issued a handout. Patient also increased nerve tightness and was educated on stretch for ulnar and median nerve tightness. Edema not addressed today because it was not present today.   Rehab Potential Good   Clinical Impairments Affecting Rehab Potential pt to begin radiation on Thursday   PT Frequency 2x / week   PT Duration 4 weeks   PT Treatment/Interventions Prosthetic Training;Therapeutic exercise;Patient/family education;Manual techniques;Manual lymph drainage;Compression bandaging;Scar mobilization;Passive range of motion;Vasopneumatic Device;Taping;ADLs/Self Care Home Management   PT Next Visit Plan Begin process of getting pt compression sleeve, cont myofascial to Lt axilla, PROM/AROM/AAROM to left shoulder, lymphedema risk reduction practices   PT Home Exercise Plan supine dowel exercises, scapular stabilization exercises, neural stretches (on wall and praying hands)   Consulted and Agree with Plan of Care Patient      Patient will benefit from skilled therapeutic intervention in order to improve the following deficits and impairments:  Decreased knowledge of use of DME, Increased fascial restricitons, Pain, Decreased scar mobility, Impaired UE functional use, Decreased strength, Decreased range of motion, Decreased knowledge of precautions, Increased edema  Visit Diagnosis: Stiffness of left shoulder, not elsewhere classified  Acute pain of left shoulder     Problem List Patient Active Problem List   Diagnosis Date Noted  . Genetic testing 12/20/2015  . S/P mastectomy 12/09/2015  . Family history of uterine cancer 10/29/2015  . Iron  deficiency anemia 10/27/2015  . Cancer of central portion of left female breast ([place]) 10/22/2015  . Cephalalgia 08/26/2015    Alexia Freestone 06/07/2016, 5:12 PM  [place], [place], [address] Phone: [phone]   Fax:  [phone]  Name: Michele Salas MRN: [id number] Date of Birth: March 31, 1981  Allyson Sabal, PT 06/07/16 5:13 PM

## 2016-06-07 NOTE — Patient Instructions (Signed)
Over Head Pull: Narrow Grip     [place] [id number]   On back, knees bent, feet flat, band across thighs, elbows straight but relaxed. Pull hands apart (start). Keeping elbows straight, bring arms up and over head, hands toward floor. Keep pull steady on band. Hold momentarily. Return slowly, keeping pull steady, back to start. Repeat __10_ times. Band color _Yellow_____   Side Pull: Double Arm   On back, knees bent, feet flat. Arms perpendicular to body, shoulder level, elbows straight but relaxed. Pull arms out to sides, elbows straight. Resistance band comes across collarbones, hands toward floor. Hold momentarily. Slowly return to starting position. Repeat _10__ times. Band color __Yellow___   Sash   On back, knees bent, feet flat, left hand on left hip, right hand above left. Pull right arm DIAGONALLY (hip to shoulder) across chest. Bring right arm along head toward floor. Hold momentarily. Slowly return to starting position. Repeat _10__ times. Do with left arm. Band color __Yellow____   Shoulder Rotation: Double Arm   On back, knees bent, feet flat, elbows tucked at sides, bent 90, hands palms up. Pull hands apart and down toward floor, keeping elbows near sides. Hold momentarily. Slowly return to starting position. Repeat _10__ times. Band color __Yellow___

## 2016-06-08 ENCOUNTER — Ambulatory Visit
Admission: RE | Admit: 2016-06-08 | Discharge: 2016-06-08 | Disposition: A | Discharge status: Home / Self Care | Payer: Self-pay | Source: Ambulatory Visit | Attending: Radiation Oncology | Admitting: Radiation Oncology

## 2016-06-08 ENCOUNTER — Encounter: Payer: Self-pay | Admitting: Radiation Oncology

## 2016-06-08 NOTE — Progress Notes (Signed)
Received patient in the clinic for PUT with Dr. Lisbeth Renshaw on 06/03/16. During evaluation patient expressed concern about losing her job if she doesn't return to work full time on Thursday, November 2. Patient requested this RN contact her HR department to inquire on Tuesday, October 31 in the morning. Phoned Damita Dunnings, HR director on 06/07/16 at [phone]). She reports that per company policy if an employee is out of work for more than 6 months due to a medical illness and unable to return to work without restricitions their job will be terminated. Inquire about long term disability and Nira Conn confirms the patient doesn't have this. Nira Conn reports the patient can re apply for her job and if hired will be considered a new hire. Spoke with Darlena in [place] as well and she confirmed and agreed with all things spoken by [employer]. Darlena goes onto to say the patient's current position requires her to work 12 hour days. Darlena confirms that no note from a medical provider will override company policy. Phoned Almyra Free, interpretor, who was present during initial encounter with patient and explained my findings. She committed to contacting the patient and relaying my findings. Provided her with patient's cell/home number.

## 2016-06-09 ENCOUNTER — Ambulatory Visit: Payer: Self-pay | Attending: Radiation Oncology | Admitting: Physical Therapy

## 2016-06-09 ENCOUNTER — Ambulatory Visit
Admission: RE | Admit: 2016-06-09 | Discharge: 2016-06-09 | Disposition: A | Discharge status: Home / Self Care | Payer: Self-pay | Source: Ambulatory Visit | Attending: Radiation Oncology | Admitting: Radiation Oncology

## 2016-06-09 ENCOUNTER — Encounter: Payer: Self-pay | Admitting: Physical Therapy

## 2016-06-09 DIAGNOSIS — M25512 Pain in left shoulder: Secondary | ICD-10-CM

## 2016-06-09 DIAGNOSIS — R600 Localized edema: Secondary | ICD-10-CM | POA: Insufficient documentation

## 2016-06-09 DIAGNOSIS — M25612 Stiffness of left shoulder, not elsewhere classified: Secondary | ICD-10-CM

## 2016-06-09 NOTE — Therapy (Signed)
[place], [place], [address] Phone: [phone]   Fax:  [phone]  Physical Therapy Treatment  Patient Details  Name: Michele Salas MRN: [id number] Date of Birth: 1981/06/07 Referring Provider: Hayden Pedro  Encounter Date: 06/09/2016      PT End of Session - 06/09/16 1703    Visit Number 4   Number of Visits 9   Date for PT Re-Evaluation 06/28/16   PT Start Time 1521   PT Stop Time 1603   PT Time Calculation (min) 42 min   Activity Tolerance Patient tolerated treatment well   Behavior During Therapy [facility] for tasks assessed/performed      Past Medical History:  Diagnosis Date  . Breast cancer, left ([place]) 10/22/2015  . GERD (gastroesophageal reflux disease)   . Iron deficiency anemia 10/27/2015    Past Surgical History:  Procedure Laterality Date  . MASTECTOMY MODIFIED RADICAL Left 12/09/2015   Procedure: LEFT MODIFIED RADICAL MASTECTOMY;  Surgeon: Aviva Signs, MD;  Location: [facility];  Service: General;  Laterality: Left;  . PORTACATH PLACEMENT Right 12/09/2015   Procedure: INSERTION PORT-A-CATH RIGHT SUBCLAVIAN;  Surgeon: Aviva Signs, MD;  Location: [facility];  Service: General;  Laterality: Right;    There were no vitals filed for this visit.      Subjective Assessment - 06/09/16 1523    Subjective I wasn't sore after last time. I don't see much swelling. The exercises were fine. I am able to do them everyday.    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 - ASSOCIATED HIGH GRADE DCIS - MARGINS ARE NEGATIVE. 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 - EIGHT ADDITIONAL LYMPH NODES WITH NO TUMOR SEEN, HER2 NEGATIVE, ER 0%, PR 0%, Ki-67 marker 90%, left mastectomy, completed chemo, going to begin radiation 06/02/16   Patient Stated Goals I really don't know what it is   Currently in Pain? No/denies   Pain Score 0-No pain                         [facility] Adult PT Treatment/Exercise - 06/09/16 0001      Shoulder Exercises: Supine   Horizontal ABduction Strengthening;Both;10 reps;Theraband  red   External Rotation Strengthening;Both;10 reps;Theraband   Theraband Level (Shoulder External Rotation) Level 2 (Red)   Flexion Strengthening;Both;10 reps;Theraband  narrow and wide grip   Theraband Level (Shoulder Flexion) Level 2 (Red)   Other Supine Exercises D2 with red band x 10 reps each     Shoulder Exercises: Standing   External Rotation Strengthening;Left;10 reps;Theraband   Theraband Level (Shoulder External Rotation) Level 2 (Red)   Internal Rotation Strengthening;10 reps;Theraband;Left   Theraband Level (Shoulder Internal Rotation) Level 2 (Red)   Flexion Strengthening;Left;10 reps;Theraband   Theraband Level (Shoulder Flexion) Level 2 (Red)   Extension Strengthening;Left;10 reps;Theraband   Theraband Level (Shoulder Extension) Level 2 (Red)   Other Standing Exercises reeducated pt in neural stretch on wall and carpal tunnel stretch in sitting (prayer hands)     Shoulder Exercises: Pulleys   Flexion 2 minutes   ABduction 2 minutes     Shoulder Exercises: Therapy Ball   Flexion 10 reps  with stretch at end range   ABduction 10 reps  with stretch at end range     Shoulder Exercises: Stretch   Other Shoulder Stretches over foam roller with arms outstretched  into abduction      Manual Therapy   Myofascial Release To Lt chest wall during P/ROM in opposite direction of UE abduction stretch   Passive ROM In Supine to Lt shoulder into flexion, abduction with er    Neural Stretch To Lt UE at 120 degrees of abduction                 PT Education - 06/09/16 1705    Education provided Yes   Education Details lymphedema risk reduction practices   Person(s) Educated Patient   Methods Explanation;Handout   Comprehension  Verbalized understanding                Long Term Clinic Goals - 06/09/16 1554      CC Long Term Goal  #1   Title Patient to receive appropriate compression garment from DME supplier.    Baseline 06/09/16- have not given pt information to obtain yet   Time 4   Period Weeks   Status On-going     CC Long Term Goal  #2   Title Patient to demonstrate 160 degrees of left shoulder abduction to allow her to reach items out to sides.    Baseline 104, 06/09/16- 164 degrees   Status Achieved     CC Long Term Goal  #3   Title Patient to demonstrate 170 degrees of left shoulder flexion to allow her to reach items out to sides.   Baseline 136, 06/09/16- 166 degrees   Time 4   Period Weeks   Status On-going     CC Long Term Goal  #4   Title Patient to be able to independently verbalize lymphedema risk reduction practices   Baseline 06/09/16-instructed pt in this today   Time 4   Period Weeks   Status On-going            Plan - 06/09/16 1703    Clinical Impression Statement Patient has met one of her shoulder ROM goals this visit. Educated patient about lymphedema risk reduction practices and issued a handout. Patient is demonstrating increased ROM and was advanced from yellow theraband this visit to red theraband. Her nerve tighness is also improving. Pt continues to not demonstrate any edema in her forearm.    Rehab Potential Good   Clinical Impairments Affecting Rehab Potential pt to begin radiation on Thursday   PT Frequency 2x / week   PT Duration 4 weeks   PT Treatment/Interventions Prosthetic Training;Therapeutic exercise;Patient/family education;Manual techniques;Manual lymph drainage;Compression bandaging;Scar mobilization;Passive range of motion;Vasopneumatic Device;Taping;ADLs/Self Care Home Management   PT Next Visit Plan Begin process of getting pt compression sleeve, cont myofascial to Lt axilla, PROM/AROM/AAROM to left shoulder,    PT Home Exercise Plan supine dowel  exercises, scapular stabilization exercises, neural stretches (on wall and praying hands)   Consulted and Agree with Plan of Care Patient      Patient will benefit from skilled therapeutic intervention in order to improve the following deficits and impairments:  Decreased knowledge of use of DME, Increased fascial restricitons, Pain, Decreased scar mobility, Impaired UE functional use, Decreased strength, Decreased range of motion, Decreased knowledge of precautions, Increased edema  Visit Diagnosis: Stiffness of left shoulder, not elsewhere classified  Acute pain of left shoulder     Problem List Patient Active Problem List   Diagnosis Date Noted  . Genetic testing 12/20/2015  . S/P mastectomy 12/09/2015  . Family history of uterine cancer 10/29/2015  . Iron deficiency anemia 10/27/2015  . Cancer of  central portion of left female breast ([place]) 10/22/2015  . Cephalalgia 08/26/2015    Alexia Freestone 06/09/2016, 5:06 PM  [place], [place], [address] Phone: [phone]   Fax:  [phone]  Name: Michele Salas MRN: [id number] Date of Birth: 1981/04/16  Allyson Sabal, PT 06/09/16 5:06 PM

## 2016-06-10 ENCOUNTER — Ambulatory Visit
Admission: RE | Admit: 2016-06-10 | Discharge: 2016-06-10 | Disposition: A | Discharge status: Home / Self Care | Payer: Self-pay | Source: Ambulatory Visit | Attending: Radiation Oncology | Admitting: Radiation Oncology

## 2016-06-10 ENCOUNTER — Encounter: Payer: Self-pay | Admitting: Radiation Oncology

## 2016-06-10 VITALS — BP 132/97 | HR 88 | Temp 97.7°F | Resp 16 | Wt 146.6 lb

## 2016-06-10 DIAGNOSIS — C50112 Malignant neoplasm of central portion of left female breast: Secondary | ICD-10-CM

## 2016-06-10 DIAGNOSIS — Z171 Estrogen receptor negative status [ER-]: Secondary | ICD-10-CM

## 2016-06-10 NOTE — Progress Notes (Signed)
Department of Radiation Oncology  Phone:  [phone] Fax:        [phone]  Weekly Treatment Note    Name: Michele Salas Date: 06/10/2016 MRN: [id number] DOB: [birth year]   Diagnosis:     ICD-9-CM ICD-10-CM   1. Malignant neoplasm of central portion of left breast in female, estrogen receptor negative (HCC) 174.1 C50.112    V86.1 Z17.1      Current dose: 12.6 Gy  Current fraction: 7   MEDICATIONS: Current Outpatient Prescriptions  Medication Sig Dispense Refill  . hyaluronate sodium (RADIAPLEXRX) GEL Apply 1 application topically 2 (two) times daily.    Marland Kitchen lidocaine-prilocaine (EMLA) cream Apply a quarter size amount to port site 1 hour prior to chemo. Do not rub in. Cover with plastic wrap. 30 g 3  . non-metallic deodorant (ALRA) MISC Apply 1 application topically daily as needed.    Marland Kitchen omeprazole (PRILOSEC) 40 MG capsule Take 1 capsule (40 mg total) by mouth daily. 30 capsule 3  . ondansetron (ZOFRAN) 8 MG tablet Take 1 tablet (8 mg total) by mouth every 8 (eight) hours as needed for nausea or vomiting. 30 tablet 2  . oxyCODONE-acetaminophen (PERCOCET/ROXICET) 5-325 MG tablet Take 1-2 tablets by mouth every 4 (four) hours as needed for moderate pain. 40 tablet 0  . Pegfilgrastim (NEULASTA ONPRO [place]) Inject into the skin. To be given 27 hours after the completion of chemo.    . potassium chloride SA (K-DUR,KLOR-CON) 20 MEQ tablet Take 0.5 tablets (10 mEq total) by mouth daily. 30 tablet 1  . prochlorperazine (COMPAZINE) 10 MG tablet Take 1 tablet (10 mg total) by mouth every 6 (six) hours as needed for nausea or vomiting. 30 tablet 2  . propranolol (INDERAL) 20 MG tablet 1 po bid. Tome una tableta por boca dos veces diarias 60 tablet 3   No current facility-administered medications for this encounter.      ALLERGIES: Review of patient's allergies indicates no known allergies.   LABORATORY DATA:  Lab Results  Component Value Date   WBC 5.1 05/12/2016   HGB  11.1 (L) 05/12/2016   HCT 33.5 (L) 05/12/2016   MCV 88.6 05/12/2016   PLT 374 05/12/2016   Lab Results  Component Value Date   NA 138 05/12/2016   K 3.6 05/12/2016   CL 107 05/12/2016   CO2 24 05/12/2016   Lab Results  Component Value Date   ALT 133 (H) 05/12/2016   AST 75 (H) 05/12/2016   ALKPHOS 79 05/12/2016   BILITOT 0.5 05/12/2016     NARRATIVE: Michele Salas was seen today for weekly treatment management. The chart was checked and the patient's films were reviewed.  Weekly radiation treatements breast. The nurse gave a written letter, regarding work, for the patient from myself. The nurse also printed a calander of the patient's starting and ending times and when she can return to work. The nurse also gave a copiy to [name], the interpreter. The patient is using Radiaplex BID. The patient reports pruritus of her entire body last Wednesday; especially on the face and back.  BP (!) 132/97 (BP Location: Right Arm, Patient Position: Sitting, Cuff Size: Normal)   Pulse 88   Temp 97.7 F (36.5 C) (Oral)   Resp 16   Wt 146 lb 9.6 oz (66.5 kg)   BMI 26.81 kg/m  Wt Readings from Last 3 Encounters:  06/10/16 146 lb 9.6 oz (66.5 kg)  06/03/16 147 lb 6.4 oz (66.9 kg)  06/02/16 147 lb 3.2  oz (66.8 kg)    PHYSICAL EXAMINATION: weight is 146 lb 9.6 oz (66.5 kg). Her oral temperature is 97.7 F (36.5 C). Her blood pressure is 132/97 (abnormal) and her pulse is 88. Her respiration is 16.    No skin change or rash noted on the patient's back or face.  ASSESSMENT: The patient is doing satisfactorily with treatment.  PLAN: We will continue with the patient's radiation treatment as planned. I advised the patient to use OTC Benadryl for the itching if it starts again.  ------------------------------------------------  Jodelle Gross, MD, PhD  This document serves as a record of services personally performed by Kyung Rudd, MD. It was created on his behalf by Darcus Austin, a  trained medical scribe. The creation of this record is based on the scribe's personal observations and the provider's statements to them. This document has been checked and approved by the attending provider.

## 2016-06-10 NOTE — Progress Notes (Addendum)
Weekly radiation treatements breast chest wall  And axilla 7/28, mild erythema, skin intact, , using radaiplex   bid, c/o itching all over, and  back no rash  Seen, appetite good Gave written letter for patient from MD  And also printed calander of patient starting and ending tomes and when she can return to work, gave copy  to [place] the interpreter and copy to patient radiaplex bid BP (!) 132/97 (BP Location: Right Arm, Patient Position: Sitting, Cuff Size: Normal)   Pulse 88   Temp 97.7 F (36.5 C) (Oral)   Resp 16   Wt 146 lb 9.6 oz (66.5 kg)   BMI 26.81 kg/m   Wt Readings from Last 3 Encounters:  06/10/16 146 lb 9.6 oz (66.5 kg)  06/03/16 147 lb 6.4 oz (66.9 kg)  06/02/16 147 lb 3.2 oz (66.8 kg)

## 2016-06-13 ENCOUNTER — Ambulatory Visit
Admission: RE | Admit: 2016-06-13 | Discharge: 2016-06-13 | Disposition: A | Discharge status: Home / Self Care | Payer: Self-pay | Source: Ambulatory Visit | Attending: Radiation Oncology | Admitting: Radiation Oncology

## 2016-06-14 ENCOUNTER — Ambulatory Visit: Payer: Self-pay | Admitting: Physical Therapy

## 2016-06-14 ENCOUNTER — Ambulatory Visit
Admission: RE | Admit: 2016-06-14 | Discharge: 2016-06-14 | Disposition: A | Discharge status: Home / Self Care | Payer: Self-pay | Source: Ambulatory Visit | Attending: Radiation Oncology | Admitting: Radiation Oncology

## 2016-06-14 DIAGNOSIS — R600 Localized edema: Secondary | ICD-10-CM

## 2016-06-14 DIAGNOSIS — M25512 Pain in left shoulder: Secondary | ICD-10-CM

## 2016-06-14 DIAGNOSIS — M25612 Stiffness of left shoulder, not elsewhere classified: Secondary | ICD-10-CM

## 2016-06-14 NOTE — Therapy (Signed)
[place], [place], [address] Phone: [phone]   Fax:  [phone]  Physical Therapy Treatment  Patient Details  Name: Michele Salas MRN: [id number] Date of Birth: 07/03/1981 Referring Provider: Hayden Pedro  Encounter Date: 06/14/2016      PT End of Session - 06/14/16 1254    Visit Number 5   Number of Visits 9   Date for PT Re-Evaluation 06/28/16   PT Start Time 2563   PT Stop Time 1055   PT Time Calculation (min) 40 min   Activity Tolerance Patient tolerated treatment well   Behavior During Therapy [facility] for tasks assessed/performed      Past Medical History:  Diagnosis Date  . Breast cancer, left ([place]) 10/22/2015  . GERD (gastroesophageal reflux disease)   . Iron deficiency anemia 10/27/2015    Past Surgical History:  Procedure Laterality Date  . MASTECTOMY MODIFIED RADICAL Left 12/09/2015   Procedure: LEFT MODIFIED RADICAL MASTECTOMY;  Surgeon: Aviva Signs, MD;  Location: [facility];  Service: General;  Laterality: Left;  . PORTACATH PLACEMENT Right 12/09/2015   Procedure: INSERTION PORT-A-CATH RIGHT SUBCLAVIAN;  Surgeon: Aviva Signs, MD;  Location: [facility];  Service: General;  Laterality: Right;    There were no vitals filed for this visit.      Subjective Assessment - 06/14/16 1020    Subjective "I lost my job" She plan to finish radiaiton on Dec. 12  She has not started looking into a compression sleeve but is interested in learning more about [employer].    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 - ASSOCIATED HIGH GRADE DCIS - MARGINS ARE NEGATIVE. 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 - EIGHT ADDITIONAL LYMPH NODES WITH NO TUMOR SEEN, HER2 NEGATIVE, ER 0%, PR 0%, Ki-67 marker 90%, left mastectomy, completed chemo, going to begin radiation 06/02/16   Patient Stated Goals I really don't know what it is   Currently in Pain? No/denies            [facility] PT Assessment - 06/14/16 1029      AROM   Left Shoulder Flexion 170 Degrees   Left Shoulder ABduction 168 Degrees                     [facility] Adult PT Treatment/Exercise - 06/14/16 0001      Self-Care   Self-Care Other Self-Care Comments   Other Self-Care Comments  reinforced lymphedema risk reduction practices through interpreter and patient confirmed she understood encouraged pt start doing a walking program and gradually work up to 30 minutes a day.  She says she has a park nearby and says she will try to start walking      Shoulder Exercises: Standing   Horizontal ABduction Strengthening;Both;10 reps;Theraband   Theraband Level (Shoulder Horizontal ABduction) Level 2 (Red)   External Rotation Strengthening;Both;10 reps;Theraband   Theraband Level (Shoulder External Rotation) Level 2 (Red)   Flexion Strengthening;Both;10 reps;Theraband   Theraband Level (Shoulder Flexion) Level 2 (Red)     Shoulder Exercises: Pulleys   Flexion 2 minutes   ABduction 2 minutes     Shoulder Exercises: Therapy Ball   Other Therapy Ball Exercises from sittling, learned forward with both hands on big green ball to stretch axilla and alteral trunk and then also to both diagonal directions. Talked to patient about doing this at  home on kitchen table with paper plates or towel to reduce friction      Manual Therapy   Passive ROM In Supine to Lt shoulder into flexion, abduction with er                         [facility] Goals - 06/14/16 1254      CC Long Term Goal  #1   Title Patient to receive appropriate compression garment from DME supplier.    Status On-going     CC Long Term Goal  #2   Title Patient to demonstrate 160 degrees of left shoulder abduction to allow her to reach items out to sides.    Baseline 104, 06/09/16- 164 degrees, 168 on 06/14/2016     Status Achieved     CC Long Term Goal  #3   Title Patient to demonstrate 170 degrees of left shoulder flexion to allow her to reach items out to sides.   Baseline 136, 06/09/16- 166 degrees, 170 on 06/14/2016   Status Achieved     CC Long Term Goal  #4   Title Patient to be able to independently verbalize lymphedema risk reduction practices   Baseline 06/09/16-instructed pt in this today   Status Achieved            Plan - 06/14/16 1256    Clinical Impression Statement Pt has acheived her goals for shoulder ROM and lymphedema risk reduction.  She has not met goal for compression sleeve.  Considering that she has had 10 lymph nodes removed, our recommendation would be for her to get a Class One compression Sleeve and gauntlet    Rehab Potential Good   Clinical Impairments Affecting Rehab Potential current radiation, 10 nodes removed    PT Frequency 2x / week   PT Duration 4 weeks   PT Next Visit Plan continue with ROM and ask pt to demonstrate her home exercise, verify she knows to do walking program.  Give information to contact [employer] [facility] [place] for the sleeve If no new issues, discharge.    PT Home Exercise Plan --   Consulted and Agree with Plan of Care Patient      Patient will benefit from skilled therapeutic intervention in order to improve the following deficits and impairments:  Decreased knowledge of use of DME, Increased fascial restricitons, Pain, Decreased scar mobility, Impaired UE functional use, Decreased strength, Decreased range of motion, Decreased knowledge of precautions, Increased edema  Visit Diagnosis: Stiffness of left shoulder, not elsewhere classified  Acute pain of left shoulder  Localized edema     Problem List Patient Active Problem List   Diagnosis Date Noted  . Genetic testing 12/20/2015  . S/P mastectomy 12/09/2015  . Family history of uterine cancer 10/29/2015  . Iron deficiency anemia 10/27/2015  . Cancer of central portion of  left female breast ([place]) 10/22/2015  . Cephalalgia 08/26/2015   Donato Heinz. Owens Shark PT  Norwood Levo 06/14/2016, 1:01 PM  [place], [place], [address] Phone: [phone]   Fax:  [phone]  Name: Takesha Steger MRN: [id number] Date of Birth: 07-02-1981

## 2016-06-15 ENCOUNTER — Ambulatory Visit
Admission: RE | Admit: 2016-06-15 | Discharge: 2016-06-15 | Disposition: A | Discharge status: Home / Self Care | Payer: Self-pay | Source: Ambulatory Visit | Attending: Radiation Oncology | Admitting: Radiation Oncology

## 2016-06-15 ENCOUNTER — Inpatient Hospital Stay
Admission: RE | Admit: 2016-06-15 | Discharge: 2016-06-15 | Disposition: A | Discharge status: Home / Self Care | Payer: Self-pay | Source: Ambulatory Visit | Attending: Radiation Oncology | Admitting: Radiation Oncology

## 2016-06-16 ENCOUNTER — Ambulatory Visit
Admission: RE | Admit: 2016-06-16 | Discharge: 2016-06-16 | Disposition: A | Discharge status: Home / Self Care | Payer: Self-pay | Source: Ambulatory Visit | Attending: Radiation Oncology | Admitting: Radiation Oncology

## 2016-06-16 ENCOUNTER — Ambulatory Visit: Payer: Self-pay

## 2016-06-16 DIAGNOSIS — M25512 Pain in left shoulder: Secondary | ICD-10-CM

## 2016-06-16 DIAGNOSIS — R600 Localized edema: Secondary | ICD-10-CM

## 2016-06-16 DIAGNOSIS — M25612 Stiffness of left shoulder, not elsewhere classified: Secondary | ICD-10-CM

## 2016-06-16 NOTE — Therapy (Addendum)
[place], [place], [address] Phone: [phone]   Fax:  [phone]  Physical Therapy Treatment  Patient Details  Name: Michele Salas MRN: [id number] Date of Birth: September 14, 1980 Referring Provider: Hayden Pedro  Encounter Date: 06/16/2016      PT End of Session - 06/16/16 1156    Visit Number 6   Number of Visits 9   Date for PT Re-Evaluation 06/28/16   PT Start Time 1111  Awaiting interpreters arrival   PT Stop Time 1151   PT Time Calculation (min) 40 min   Activity Tolerance Patient tolerated treatment well   Behavior During Therapy [facility] for tasks assessed/performed      Past Medical History:  Diagnosis Date  . Breast cancer, left ([place]) 10/22/2015  . GERD (gastroesophageal reflux disease)   . Iron deficiency anemia 10/27/2015    Past Surgical History:  Procedure Laterality Date  . MASTECTOMY MODIFIED RADICAL Left 12/09/2015   Procedure: LEFT MODIFIED RADICAL MASTECTOMY;  Surgeon: Aviva Signs, MD;  Location: [facility];  Service: General;  Laterality: Left;  . PORTACATH PLACEMENT Right 12/09/2015   Procedure: INSERTION PORT-A-CATH RIGHT SUBCLAVIAN;  Surgeon: Aviva Signs, MD;  Location: [facility];  Service: General;  Laterality: Right;    There were no vitals filed for this visit.      Subjective Assessment - 06/16/16 1115    Subjective I'm doing good. Feeling ready to D/C.    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 - ASSOCIATED HIGH GRADE DCIS - MARGINS ARE NEGATIVE. 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 - EIGHT ADDITIONAL LYMPH NODES WITH NO TUMOR SEEN, HER2 NEGATIVE, ER 0%, PR 0%, Ki-67 marker 90%, left mastectomy, completed chemo, going to begin radiation 06/02/16   Patient Stated Goals I really don't know what it is   Currently in Pain? No/denies             [facility] PT Assessment - 06/16/16 0001      AROM   Left Shoulder Flexion 170 Degrees   Left Shoulder ABduction 170 Degrees                     [facility] Adult PT Treatment/Exercise - 06/16/16 0001      Shoulder Exercises: Standing   Horizontal ABduction Strengthening;Both;10 reps;Theraband   Theraband Level (Shoulder Horizontal ABduction) Level 2 (Red)   External Rotation Strengthening;Both;10 reps;Theraband   Theraband Level (Shoulder External Rotation) Level 2 (Red)   Flexion Strengthening;Both;10 reps;Theraband  Narrow and Wide Grip   Theraband Level (Shoulder Flexion) Level 2 (Red)     Shoulder Exercises: Pulleys   Flexion 2 minutes   ABduction 2 minutes     Manual Therapy   Manual Lymphatic Drainage (MLD) In Supine: Short neck, superficial and deep abdominals, Lt inguinal nodes and Lt axillo-inguinal anastomosis, Rt axillary nodes, anterior inter-axillary anastomosis, then Lt UE from dorsal hand to lateral shoulder, working proximal to distal then retracing all steps.    Passive ROM In Supine to Lt shoulder into flexion, abduction with er, and D2.                         [facility] Go[facility] - 06/16/16 1205      CC Long Term Goal  #1   Title Patient to receive appropriate compression garment from DME supplier.  Baseline 06/09/16- have not given pt information to obtain yet; pt instructed in this today and [facility] to cover garment; pt to go to [facility]   Status Partially Met     CC Long Term Goal  #2   Title Patient to demonstrate 160 degrees of left shoulder abduction to allow her to reach items out to sides.    Baseline 104, 06/09/16- 164 degrees, 168 on 06/14/2016; 170 degrees 06/16/16   Status Achieved     CC Long Term Goal  #3   Title Patient to demonstrate 170 degrees of left shoulder flexion to allow her to reach items out to sides.   Baseline 136, 06/09/16- 166 degrees, 170 on 06/14/2016; 170 degrees 06/16/16   Status  Achieved     CC Long Term Goal  #4   Title Patient to be able to independently verbalize lymphedema risk reduction practices   Baseline 06/09/16-instructed pt in this today; reviewed this today with pt and she had no questions 06/16/16   Status Achieved            Plan - 06/16/16 1156    Clinical Impression Statement Pt has met all goals and was instructed today on where to be fitted for a compression sleeve and gauntlet. Advised pt to go to [facility] and they will bill [employer] for pt. Pt is pleased with her current functional status and will be D/C at this time.    Rehab Potential Good   Clinical Impairments Affecting Rehab Potential current radiation, 10 nodes removed    PT Frequency 2x / week   PT Duration 4 weeks   PT Treatment/Interventions Prosthetic Training;Therapeutic exercise;Patient/family education;Manual techniques;Manual lymph drainage;Compression bandaging;Scar mobilization;Passive range of motion;Vasopneumatic Device;Taping;ADLs/Self Care Home Management   PT Next Visit Plan D/C this visit.   Recommended Other Services Pt to go to [facility] for class I compression sleeve and gauntlet   Consulted and Agree with Plan of Care Patient      Patient will benefit from skilled therapeutic intervention in order to improve the following deficits and impairments:  Decreased knowledge of use of DME, Increased fascial restricitons, Pain, Decreased scar mobility, Impaired UE functional use, Decreased strength, Decreased range of motion, Decreased knowledge of precautions, Increased edema  Visit Diagnosis: Stiffness of left shoulder, not elsewhere classified  Acute pain of left shoulder  Localized edema     Problem List Patient Active Problem List   Diagnosis Date Noted  . Genetic testing 12/20/2015  . S/P mastectomy 12/09/2015  . Family history of uterine cancer 10/29/2015  . Iron deficiency anemia 10/27/2015  . Cancer of central portion of left female  breast ([facility]) 10/22/2015  . Cephalalgia 08/26/2015    Rosenberger, Valerie Ann, PTA 06/16/2016, 12:09 PM  [facility] [address] [place], [place], [address] Phone: [phone]   Fax:  [phone]  Name: Marlean Bench MRN: [id number] Date of Birth: 10/08/1980  PHYSICAL THERAPY DISCHARGE SUMMARY  Visits from Start of Care: 6  Current functional level related to goals / functional outcomes: As above: pt is doing well   Remaining deficits: As above   Education / Equipment: As above: home exericse, lymphedema risk reduction  Plan: Patient agrees to discharge.  Patient goals were met. Patient is being discharged due to meeting the stated rehab goals.  ?????     Teresa Brown, PT 06/24/16 8:20 AM  

## 2016-06-17 ENCOUNTER — Ambulatory Visit
Admission: RE | Admit: 2016-06-17 | Discharge: 2016-06-17 | Disposition: A | Discharge status: Home / Self Care | Payer: Self-pay | Source: Ambulatory Visit | Attending: Radiation Oncology | Admitting: Radiation Oncology

## 2016-06-20 ENCOUNTER — Ambulatory Visit
Admission: RE | Admit: 2016-06-20 | Discharge: 2016-06-20 | Disposition: A | Discharge status: Home / Self Care | Payer: Self-pay | Source: Ambulatory Visit | Attending: Radiation Oncology | Admitting: Radiation Oncology

## 2016-06-20 ENCOUNTER — Ambulatory Visit: Admission: RE | Admit: 2016-06-20 | Payer: Self-pay | Source: Ambulatory Visit | Admitting: Radiation Oncology

## 2016-06-20 ENCOUNTER — Encounter: Payer: Self-pay | Admitting: Radiation Oncology

## 2016-06-20 VITALS — BP 135/81 | HR 86 | Temp 98.1°F | Resp 20 | Wt 146.4 lb

## 2016-06-20 DIAGNOSIS — Z171 Estrogen receptor negative status [ER-]: Secondary | ICD-10-CM

## 2016-06-20 DIAGNOSIS — C50112 Malignant neoplasm of central portion of left female breast: Secondary | ICD-10-CM

## 2016-06-20 NOTE — Progress Notes (Signed)
Department of Radiation Oncology  Phone:  [phone] Fax:        [phone]  Weekly Treatment Note    Name: Michele Salas Date: 06/20/2016 MRN: [id number] DOB: [birth year]   Diagnosis:   No diagnosis found.   Current dose: 23.4 Gy  Current fraction:13   MEDICATIONS: Current Outpatient Prescriptions  Medication Sig Dispense Refill  . hyaluronate sodium (RADIAPLEXRX) GEL Apply 1 application topically 2 (two) times daily.    Marland Kitchen lidocaine-prilocaine (EMLA) cream Apply a quarter size amount to port site 1 hour prior to chemo. Do not rub in. Cover with plastic wrap. 30 g 3  . non-metallic deodorant (ALRA) MISC Apply 1 application topically daily as needed.    Marland Kitchen omeprazole (PRILOSEC) 40 MG capsule Take 1 capsule (40 mg total) by mouth daily. 30 capsule 3  . ondansetron (ZOFRAN) 8 MG tablet Take 1 tablet (8 mg total) by mouth every 8 (eight) hours as needed for nausea or vomiting. 30 tablet 2  . oxyCODONE-acetaminophen (PERCOCET/ROXICET) 5-325 MG tablet Take 1-2 tablets by mouth every 4 (four) hours as needed for moderate pain. 40 tablet 0  . Pegfilgrastim (NEULASTA ONPRO [place]) Inject into the skin. To be given 27 hours after the completion of chemo.    . potassium chloride SA (K-DUR,KLOR-CON) 20 MEQ tablet Take 0.5 tablets (10 mEq total) by mouth daily. 30 tablet 1  . prochlorperazine (COMPAZINE) 10 MG tablet Take 1 tablet (10 mg total) by mouth every 6 (six) hours as needed for nausea or vomiting. 30 tablet 2  . propranolol (INDERAL) 20 MG tablet 1 po bid. Tome una tableta por boca dos veces diarias 60 tablet 3   No current facility-administered medications for this encounter.      ALLERGIES: Patient has no known allergies.   LABORATORY DATA:  Lab Results  Component Value Date   WBC 5.1 05/12/2016   HGB 11.1 (L) 05/12/2016   HCT 33.5 (L) 05/12/2016   MCV 88.6 05/12/2016   PLT 374 05/12/2016   Lab Results  Component Value Date   NA 138 05/12/2016   K 3.6  05/12/2016   CL 107 05/12/2016   CO2 24 05/12/2016   Lab Results  Component Value Date   ALT 133 (H) 05/12/2016   AST 75 (H) 05/12/2016   ALKPHOS 79 05/12/2016   BILITOT 0.5 05/12/2016     NARRATIVE: Michele Salas was seen today for weekly treatment management. The chart was checked and the patient's films were reviewed.  Per nurse's exam, the patient exhibits mild skin tanning, and mild erythema with no skin breakdown. Patient reports using Sonafine bid. She notes a fair appetite and denies pain. She states she is fatigued.  Patient presents with a Optometrist.  BP 135/81 (BP Location: Right Arm, Patient Position: Sitting, Cuff Size: Normal)   Pulse 86   Temp 98.1 F (36.7 C) (Oral)   Resp 20   Wt 146 lb 6.4 oz (66.4 kg)   BMI 26.78 kg/m  Wt Readings from Last 3 Encounters:  06/20/16 146 lb 6.4 oz (66.4 kg)  06/10/16 146 lb 9.6 oz (66.5 kg)  06/03/16 147 lb 6.4 oz (66.9 kg)    PHYSICAL EXAMINATION: weight is 146 lb 6.4 oz (66.4 kg). Her oral temperature is 98.1 F (36.7 C). Her blood pressure is 135/81 and her pulse is 86. Her respiration is 20.    Mild to moderate hyperpigmentation without any desquamation.   ASSESSMENT: The patient is doing satisfactorily with treatment.  PLAN: We will  continue with the patient's radiation treatment as planned.  ------------------------------------------------  Jodelle Gross, MD, PhD  This document serves as a record of services personally performed by Kyung Rudd, MD. It was created on his behalf by Bethann Humble, a trained medical scribe. The creation of this record is based on the scribe's personal observations and the provider's statements to them. This document has been checked and approved by the attending provider.

## 2016-06-20 NOTE — Progress Notes (Signed)
Weekly rad txs left chest wall/breast area 13/28 completed, mild tanning, mild pink tinge to skin, no breakdown, using radiaplex bid, tired, appetite fair, no pain BP 135/81 (BP Location: Right Arm, Patient Position: Sitting, Cuff Size: Normal)   Pulse 86   Temp 98.1 F (36.7 C) (Oral)   Resp 20   Wt 146 lb 6.4 oz (66.4 kg)   BMI 26.78 kg/m   Wt Readings from Last 3 Encounters:  06/20/16 146 lb 6.4 oz (66.4 kg)  06/10/16 146 lb 9.6 oz (66.5 kg)  06/03/16 147 lb 6.4 oz (66.9 kg)

## 2016-06-21 ENCOUNTER — Ambulatory Visit
Admission: RE | Admit: 2016-06-21 | Discharge: 2016-06-21 | Disposition: A | Discharge status: Home / Self Care | Payer: Self-pay | Source: Ambulatory Visit | Attending: Radiation Oncology | Admitting: Radiation Oncology

## 2016-06-22 ENCOUNTER — Ambulatory Visit
Admission: RE | Admit: 2016-06-22 | Discharge: 2016-06-22 | Disposition: A | Discharge status: Home / Self Care | Payer: Self-pay | Source: Ambulatory Visit | Attending: Radiation Oncology | Admitting: Radiation Oncology

## 2016-06-23 ENCOUNTER — Ambulatory Visit
Admission: RE | Admit: 2016-06-23 | Discharge: 2016-06-23 | Disposition: A | Discharge status: Home / Self Care | Payer: Self-pay | Source: Ambulatory Visit | Attending: Radiation Oncology | Admitting: Radiation Oncology

## 2016-06-24 ENCOUNTER — Ambulatory Visit
Admission: RE | Admit: 2016-06-24 | Discharge: 2016-06-24 | Disposition: A | Discharge status: Home / Self Care | Payer: Self-pay | Source: Ambulatory Visit | Attending: Radiation Oncology | Admitting: Radiation Oncology

## 2016-06-24 ENCOUNTER — Encounter: Payer: Self-pay | Admitting: Radiation Oncology

## 2016-06-24 VITALS — BP 124/91 | HR 90 | Temp 97.7°F | Resp 18 | Ht 62.0 in | Wt 146.2 lb

## 2016-06-24 DIAGNOSIS — Z171 Estrogen receptor negative status [ER-]: Secondary | ICD-10-CM

## 2016-06-24 DIAGNOSIS — C50112 Malignant neoplasm of central portion of left female breast: Secondary | ICD-10-CM

## 2016-06-24 NOTE — Progress Notes (Signed)
Weekly rad txs left chest wall/breast area 17/28 completed, mild tanning, mild pink tinge to skin, no breakdown,has swelling to outer chest wall along the incision line, using radiaplex bid, tired, appetite fair, no pain. Wt Readings from Last 3 Encounters:  06/24/16 146 lb 3.2 oz (66.3 kg)  06/20/16 146 lb 6.4 oz (66.4 kg)  06/10/16 146 lb 9.6 oz (66.5 kg)  BP (!) 124/91   Pulse 90   Temp 97.7 F (36.5 C) (Oral)   Resp 18   Ht 5' 2"  (1.575 m)   Wt 146 lb 3.2 oz (66.3 kg)   SpO2 100%   BMI 26.74 kg/m

## 2016-06-26 ENCOUNTER — Ambulatory Visit
Admission: RE | Admit: 2016-06-26 | Discharge: 2016-06-26 | Disposition: A | Discharge status: Home / Self Care | Payer: Self-pay | Source: Ambulatory Visit | Attending: Radiation Oncology | Admitting: Radiation Oncology

## 2016-06-26 NOTE — Progress Notes (Signed)
Department of Radiation Oncology  Phone:  [phone] Fax:        [phone]  Weekly Treatment Note    Name: Michele Salas Date: 06/26/2016 MRN: [id number] DOB: 09-26-1980   Diagnosis:     ICD-9-CM ICD-10-CM   1. Malignant neoplasm of central portion of left breast in female, estrogen receptor negative (HCC) 174.1 C50.112    V86.1 Z17.1      Current dose: 30.6 Gy  Current fraction: 17   MEDICATIONS: Current Outpatient Prescriptions  Medication Sig Dispense Refill  . hyaluronate sodium (RADIAPLEXRX) GEL Apply 1 application topically 2 (two) times daily.    Marland Kitchen lidocaine-prilocaine (EMLA) cream Apply a quarter size amount to port site 1 hour prior to chemo. Do not rub in. Cover with plastic wrap. 30 g 3  . non-metallic deodorant (ALRA) MISC Apply 1 application topically daily as needed.    Marland Kitchen omeprazole (PRILOSEC) 40 MG capsule Take 1 capsule (40 mg total) by mouth daily. 30 capsule 3  . oxyCODONE-acetaminophen (PERCOCET/ROXICET) 5-325 MG tablet Take 1-2 tablets by mouth every 4 (four) hours as needed for moderate pain. 40 tablet 0  . potassium chloride SA (K-DUR,KLOR-CON) 20 MEQ tablet Take 0.5 tablets (10 mEq total) by mouth daily. 30 tablet 1  . ondansetron (ZOFRAN) 8 MG tablet Take 1 tablet (8 mg total) by mouth every 8 (eight) hours as needed for nausea or vomiting. (Patient not taking: Reported on 06/24/2016) 30 tablet 2  . Pegfilgrastim (NEULASTA ONPRO [place]) Inject into the skin. To be given 27 hours after the completion of chemo.    . prochlorperazine (COMPAZINE) 10 MG tablet Take 1 tablet (10 mg total) by mouth every 6 (six) hours as needed for nausea or vomiting. (Patient not taking: Reported on 06/24/2016) 30 tablet 2  . propranolol (INDERAL) 20 MG tablet 1 po bid. Tome una tableta por boca dos veces diarias (Patient not taking: Reported on 06/24/2016) 60 tablet 3   No current facility-administered medications for this encounter.      ALLERGIES: Patient has no  known allergies.   LABORATORY DATA:  Lab Results  Component Value Date   WBC 5.1 05/12/2016   HGB 11.1 (L) 05/12/2016   HCT 33.5 (L) 05/12/2016   MCV 88.6 05/12/2016   PLT 374 05/12/2016   Lab Results  Component Value Date   NA 138 05/12/2016   K 3.6 05/12/2016   CL 107 05/12/2016   CO2 24 05/12/2016   Lab Results  Component Value Date   ALT 133 (H) 05/12/2016   AST 75 (H) 05/12/2016   ALKPHOS 79 05/12/2016   BILITOT 0.5 05/12/2016     NARRATIVE: Michele Salas was seen today for weekly treatment management. The chart was checked and the patient's films were reviewed.  Weekly rad txs left chest wall/breast area 17/28 completed, mild tanning, mild pink tinge to skin, no breakdown,has swelling to outer chest wall along the incision line, using radiaplex bid, tired, appetite fair, no pain. Wt Readings from Last 3 Encounters:  06/24/16 146 lb 3.2 oz (66.3 kg)  06/20/16 146 lb 6.4 oz (66.4 kg)  06/10/16 146 lb 9.6 oz (66.5 kg)  BP (!) 124/91   Pulse 90   Temp 97.7 F (36.5 C) (Oral)   Resp 18   Ht 5' 2"  (1.575 m)   Wt 146 lb 3.2 oz (66.3 kg)   SpO2 100%   BMI 26.74 kg/m   PHYSICAL EXAMINATION: height is 5' 2"  (1.575 m) and weight is 146 lb 3.2  oz (66.3 kg). Her oral temperature is 97.7 F (36.5 C). Her blood pressure is 124/91 (abnormal) and her pulse is 90. Her respiration is 18 and oxygen saturation is 100%.      Mild to moderate hyperpigmentation in the treatment area. No moist desquamation.  ASSESSMENT: The patient is doing satisfactorily with treatment.  PLAN: We will continue with the patient's radiation treatment as planned.

## 2016-06-27 ENCOUNTER — Ambulatory Visit
Admission: RE | Admit: 2016-06-27 | Discharge: 2016-06-27 | Disposition: A | Discharge status: Home / Self Care | Payer: Self-pay | Source: Ambulatory Visit | Attending: Radiation Oncology | Admitting: Radiation Oncology

## 2016-06-28 ENCOUNTER — Ambulatory Visit
Admission: RE | Admit: 2016-06-28 | Discharge: 2016-06-28 | Disposition: A | Discharge status: Home / Self Care | Payer: Self-pay | Source: Ambulatory Visit | Attending: Radiation Oncology | Admitting: Radiation Oncology

## 2016-06-29 ENCOUNTER — Ambulatory Visit
Admission: RE | Admit: 2016-06-29 | Discharge: 2016-06-29 | Disposition: A | Discharge status: Home / Self Care | Payer: Self-pay | Source: Ambulatory Visit | Attending: Radiation Oncology | Admitting: Radiation Oncology

## 2016-06-29 NOTE — Progress Notes (Signed)
Patient reports having a cough and runny nose that started this morning.  Notified Shona Simpson, PA who said to have patient see her primary doctor.  Relayed this to patient who verbalized understanding.

## 2016-07-01 ENCOUNTER — Encounter (HOSPITAL_COMMUNITY): Payer: Self-pay | Admitting: Hematology & Oncology

## 2016-07-01 ENCOUNTER — Ambulatory Visit: Payer: Self-pay

## 2016-07-04 ENCOUNTER — Ambulatory Visit
Admission: RE | Admit: 2016-07-04 | Discharge: 2016-07-04 | Disposition: A | Discharge status: Home / Self Care | Payer: Self-pay | Source: Ambulatory Visit | Attending: Radiation Oncology | Admitting: Radiation Oncology

## 2016-07-04 ENCOUNTER — Encounter: Payer: Self-pay | Admitting: Radiation Oncology

## 2016-07-04 VITALS — BP 113/68 | HR 91 | Temp 98.0°F | Resp 20 | Wt 146.2 lb

## 2016-07-04 DIAGNOSIS — Z170 Estrogen receptor positive status [ER+]: Principal | ICD-10-CM

## 2016-07-04 DIAGNOSIS — C50112 Malignant neoplasm of central portion of left female breast: Secondary | ICD-10-CM

## 2016-07-04 MED ORDER — RADIAPLEXRX EX GEL
Freq: Once | CUTANEOUS | Status: AC
  Administered 2016-07-04: 10:00:00 via TOPICAL

## 2016-07-04 NOTE — Progress Notes (Signed)
Department of Radiation Oncology  Phone:  [phone] Fax:        [phone]  Weekly Treatment Note    Name: Michele Salas Date: 07/04/2016 MRN: [id number] DOB: [birth year]   Diagnosis:     ICD-9-CM ICD-10-CM   1. Malignant neoplasm of central portion of left breast in female, estrogen receptor positive (HCC) 174.1 C50.112 hyaluronate sodium (RADIAPLEXRX) gel   V86.0 Z17.0      Current dose: 39.6 Gy  Current fraction: 22   MEDICATIONS: Current Outpatient Prescriptions  Medication Sig Dispense Refill  . hyaluronate sodium (RADIAPLEXRX) GEL Apply 1 application topically 2 (two) times daily.    Marland Kitchen lidocaine-prilocaine (EMLA) cream Apply a quarter size amount to port site 1 hour prior to chemo. Do not rub in. Cover with plastic wrap. 30 g 3  . non-metallic deodorant (ALRA) MISC Apply 1 application topically daily as needed.    Marland Kitchen omeprazole (PRILOSEC) 40 MG capsule Take 1 capsule (40 mg total) by mouth daily. 30 capsule 3  . oxyCODONE-acetaminophen (PERCOCET/ROXICET) 5-325 MG tablet Take 1-2 tablets by mouth every 4 (four) hours as needed for moderate pain. 40 tablet 0  . Pegfilgrastim (NEULASTA ONPRO [employer]) Inject into the skin. To be given 27 hours after the completion of chemo.    . potassium chloride SA (K-DUR,KLOR-CON) 20 MEQ tablet Take 0.5 tablets (10 mEq total) by mouth daily. 30 tablet 1  . ondansetron (ZOFRAN) 8 MG tablet Take 1 tablet (8 mg total) by mouth every 8 (eight) hours as needed for nausea or vomiting. (Patient not taking: Reported on 07/04/2016) 30 tablet 2  . prochlorperazine (COMPAZINE) 10 MG tablet Take 1 tablet (10 mg total) by mouth every 6 (six) hours as needed for nausea or vomiting. (Patient not taking: Reported on 07/04/2016) 30 tablet 2  . propranolol (INDERAL) 20 MG tablet 1 po bid. Tome una tableta por boca dos veces diarias (Patient not taking: Reported on 07/04/2016) 60 tablet 3   No current facility-administered medications for this  encounter.      ALLERGIES: Patient has no known allergies.   LABORATORY DATA:  Lab Results  Component Value Date   WBC 5.1 05/12/2016   HGB 11.1 (L) 05/12/2016   HCT 33.5 (L) 05/12/2016   MCV 88.6 05/12/2016   PLT 374 05/12/2016   Lab Results  Component Value Date   NA 138 05/12/2016   K 3.6 05/12/2016   CL 107 05/12/2016   CO2 24 05/12/2016   Lab Results  Component Value Date   ALT 133 (H) 05/12/2016   AST 75 (H) 05/12/2016   ALKPHOS 79 05/12/2016   BILITOT 0.5 05/12/2016     NARRATIVE: Michele Salas was seen today for weekly treatment management. The chart was checked and the patient's films were reviewed.  The patient has completed 22 out of 28 treatments to the left chest wall. She reports her appetite is good. Reports mild fatigue. She reports she is using radiaplex bid as directed, and reports her skin is a "a little bit itchy, and burning." An interpreter, Marlene [place]), is present with the patient today.  Wt Readings from Last 3 Encounters:  07/04/16 146 lb 3.2 oz (66.3 kg)  06/24/16 146 lb 3.2 oz (66.3 kg)  06/20/16 146 lb 6.4 oz (66.4 kg)  BP 113/68 (BP Location: Right Arm, Patient Position: Sitting, Cuff Size: Normal)   Pulse 91   Temp 98 F (36.7 C) (Oral)   Resp 20   Wt 146 lb 3.2 oz (66.3 kg)  BMI 26.74 kg/m   PHYSICAL EXAMINATION: weight is 146 lb 3.2 oz (66.3 kg). Her oral temperature is 98 F (36.7 C). Her blood pressure is 113/68 and her pulse is 91. Her respiration is 20.  Diffuse hyperpigmentation with erythema in the treatment area, without any moist desquamation. Minimal skin reaction in the supraclavicular region.  ASSESSMENT: The patient is doing satisfactorily with treatment.  PLAN: We will continue with the patient's radiation treatment as planned.   ------------------------------------------------  Jodelle Gross, MD, PhD  This document serves as a record of services personally performed by Kyung Rudd, MD. It was created on  his behalf by Maryla Morrow, a trained medical scribe. The creation of this record is based on the scribe's personal observations and the provider's statements to them. This document has been checked and approved by the attending provider.

## 2016-07-04 NOTE — Progress Notes (Addendum)
Weekly rad txs left chestwall/axilla 22/28 completed, bright erythema, dryness,hyperpigmentaion, c/o pain under axilla, skin intact, using radiaplex bid, appetite good,  and itching, mild fatigue, interpreter with patient, Marlene [place]) 10:21 AM BP 113/68 (BP Location: Right Arm, Patient Position: Sitting, Cuff Size: Normal)   Pulse 91   Temp 98 F (36.7 C) (Oral)   Resp 20   Wt 146 lb 3.2 oz (66.3 kg)   BMI 26.74 kg/m   Wt Readings from Last 3 Encounters:  07/04/16 146 lb 3.2 oz (66.3 kg)  06/24/16 146 lb 3.2 oz (66.3 kg)  06/20/16 146 lb 6.4 oz (66.4 kg)

## 2016-07-05 ENCOUNTER — Ambulatory Visit
Admission: RE | Admit: 2016-07-05 | Discharge: 2016-07-05 | Disposition: A | Discharge status: Home / Self Care | Payer: Self-pay | Source: Ambulatory Visit | Attending: Radiation Oncology | Admitting: Radiation Oncology

## 2016-07-06 ENCOUNTER — Ambulatory Visit
Admission: RE | Admit: 2016-07-06 | Discharge: 2016-07-06 | Disposition: A | Discharge status: Home / Self Care | Payer: Self-pay | Source: Ambulatory Visit | Attending: Radiation Oncology | Admitting: Radiation Oncology

## 2016-07-07 ENCOUNTER — Encounter (HOSPITAL_COMMUNITY): Payer: Self-pay | Attending: Hematology & Oncology

## 2016-07-07 ENCOUNTER — Encounter: Payer: Self-pay | Admitting: Radiation Oncology

## 2016-07-07 ENCOUNTER — Encounter (HOSPITAL_BASED_OUTPATIENT_CLINIC_OR_DEPARTMENT_OTHER): Payer: Self-pay | Admitting: Hematology & Oncology

## 2016-07-07 ENCOUNTER — Ambulatory Visit
Admission: RE | Admit: 2016-07-07 | Discharge: 2016-07-07 | Disposition: A | Discharge status: Home / Self Care | Payer: Self-pay | Source: Ambulatory Visit | Attending: Radiation Oncology | Admitting: Radiation Oncology

## 2016-07-07 ENCOUNTER — Encounter (HOSPITAL_COMMUNITY): Payer: Self-pay | Admitting: Lab

## 2016-07-07 VITALS — BP 112/79 | HR 83 | Temp 97.7°F | Resp 20 | Wt 150.2 lb

## 2016-07-07 DIAGNOSIS — Z79899 Other long term (current) drug therapy: Secondary | ICD-10-CM | POA: Insufficient documentation

## 2016-07-07 DIAGNOSIS — R232 Flushing: Secondary | ICD-10-CM

## 2016-07-07 DIAGNOSIS — N644 Mastodynia: Secondary | ICD-10-CM | POA: Insufficient documentation

## 2016-07-07 DIAGNOSIS — C773 Secondary and unspecified malignant neoplasm of axilla and upper limb lymph nodes: Secondary | ICD-10-CM

## 2016-07-07 DIAGNOSIS — R110 Nausea: Secondary | ICD-10-CM

## 2016-07-07 DIAGNOSIS — C50112 Malignant neoplasm of central portion of left female breast: Secondary | ICD-10-CM

## 2016-07-07 DIAGNOSIS — C50919 Malignant neoplasm of unspecified site of unspecified female breast: Secondary | ICD-10-CM

## 2016-07-07 DIAGNOSIS — C50912 Malignant neoplasm of unspecified site of left female breast: Secondary | ICD-10-CM | POA: Insufficient documentation

## 2016-07-07 DIAGNOSIS — R51 Headache: Secondary | ICD-10-CM | POA: Insufficient documentation

## 2016-07-07 DIAGNOSIS — D72825 Bandemia: Secondary | ICD-10-CM

## 2016-07-07 DIAGNOSIS — Z171 Estrogen receptor negative status [ER-]: Secondary | ICD-10-CM

## 2016-07-07 DIAGNOSIS — R1013 Epigastric pain: Secondary | ICD-10-CM | POA: Insufficient documentation

## 2016-07-07 DIAGNOSIS — R740 Nonspecific elevation of levels of transaminase and lactic acid dehydrogenase [LDH]: Secondary | ICD-10-CM

## 2016-07-07 DIAGNOSIS — K219 Gastro-esophageal reflux disease without esophagitis: Secondary | ICD-10-CM | POA: Insufficient documentation

## 2016-07-07 MED ORDER — HEPARIN SOD (PORK) LOCK FLUSH 100 UNIT/ML IV SOLN
500.0000 [IU] | Freq: Once | INTRAVENOUS | Status: AC
  Administered 2016-07-07: 500 [IU] via INTRAVENOUS

## 2016-07-07 MED ORDER — SODIUM CHLORIDE 0.9% FLUSH
10.0000 mL | INTRAVENOUS | Status: DC | PRN
  Administered 2016-07-07: 10 mL via INTRAVENOUS
  Filled 2016-07-07: qty 10

## 2016-07-07 NOTE — Progress Notes (Signed)
Michele Salas presented for Portacath access and flush.  Portacath located right chest wall accessed with  H 20 needle.  Good blood return present. Portacath flushed with 63m NS and 500U/546mHeparin and needle removed intact.  Procedure tolerated well and without incident.

## 2016-07-07 NOTE — Progress Notes (Unsigned)
Referral to [facility] for port removal.  Records faxed on 11/30

## 2016-07-07 NOTE — Patient Instructions (Addendum)
[place] at [facility] Discharge Instructions  RECOMMENDATIONS MADE BY THE CONSULTANT AND ANY TEST RESULTS WILL BE SENT TO YOUR REFERRING PHYSICIAN. You saw Dr.Penland today. Will refer to Dr. Arnoldo Morale to have port removed. Follow up in 6 weeks with labs. See Amy at checkout for appointments.   Thank you for choosing [place] at [facility] to provide your oncology and hematology care.  To afford each patient quality time with our provider, please arrive at least 15 minutes before your scheduled appointment time.   Beginning January 23rd 2017 lab work for the [facility] will be done in the  Main lab at [facility] on 1st floor. If you have a lab appointment with the [place] please come in thru the  Main Entrance and check in at the main information desk  You need to re-schedule your appointment should you arrive 10 or more minutes late.  We strive to give you quality time with our providers, and arriving late affects you and other patients whose appointments are after yours.  Also, if you no show three or more times for appointments you may be dismissed from the clinic at the providers discretion.     Again, thank you for choosing [facility].  Our hope is that these requests will decrease the amount of time that you wait before being seen by our physicians.       _____________________________________________________________  Should you have questions after your visit to [facility], please contact our office at [phone] between the hours of 8:30 a.m. and 4:30 p.m.  Voicemails left after 4:30 p.m. will not be returned until the following business day.  For prescription refill requests, have your pharmacy contact our office.         Resources For Cancer Patients and their Caregivers ? American Cancer Society: Can assist with transportation, wigs, general needs, runs Look Good Feel Better.         228 167 5081 ? Cancer Care: Provides financial assistance, online support groups, medication/co-pay assistance.  1-800-813-HOPE 623-174-9530) ? [place] Assists [place] cancer patients and their families through emotional , educational and financial support.  708-847-1012 ? [place] DSS Where to apply for food stamps, Medicaid and utility assistance. (205)348-8652 ? [facility]: Transportation to medical appointments. (365)639-8412 ? Social Security Administration: May apply for disability if have a Stage IV cancer. 7153334325 628-361-8883 ? [employer], Disability and Transit Services: Assists with nutrition, care and transit needs. [place] Support Programs: @10RELATIVEDAYS @ > Cancer Support Group  2nd Tuesday of the month 1pm-2pm, Journey Room  > Creative Journey  3rd Tuesday of the month 1130am-1pm, Journey Room  > Look Good Feel Better  1st Wednesday of the month 10am-12 noon, Journey Room (Call [place] to register [phone])

## 2016-07-07 NOTE — Progress Notes (Signed)
[facility] Hematology/Oncology Progress Note  Name: Michele Salas      MRN: [id number]    Date: 07/10/2016 Time:7:57 PM   REFERRING PHYSICIAN:  Aviva Signs, MD (Gen Surg)  REASON FOR CONSULT:  Left sided breast cancer   DIAGNOSIS:  Invasive ductal carcinoma, high grade, of left breast.  HISTORY OF PRESENT ILLNESS:      Cancer of central portion of left female breast ([place])   09/16/2015 Mammogram    1.6 cm retroareolar left breast mass measuring 1.6 cm in largest diameter in addition to 3 abnormal appearing left inferior axillary lymph nodes.      09/23/2015 Initial Biopsy    US guided core biopsy of left breast mass and suspicious left axillary lymph nodes.      09/23/2015 Pathology Results    Invasive ductal carcinoma, high grade.  Left axilla lymph node is positive for metastatic carcinoma.  ER/PR NEGATIVE, HER2 NEGATIVE, KI-67 marker of 85%.      10/26/2015 Imaging    Bone scan- No evidence of metastatic disease to the skeleton.      10/27/2015 Imaging    CT CAP-  Retroareolar mass with 2 asymmetrically prominent left axillary lymph nodes and an upper normal size left subpectoral lymph node. No other findings of metastatic disease in the chest, abdomen, or pelvis.      11/05/2015 Echocardiogram    The estimated ejection fraction was in the range of 60% to 65%.      11/23/2015 Miscellaneous    Genetic Counseling- genetic test result was negative for any known pathogenic mutations within any of 28 genes that would cause her to be at an increased genetic risk for breast, ovarian, or other related cancers.      12/09/2015 Surgery    L radical mastectomy, axillary LN dissection, port a cath insertion with Dr. Aviva Signs 12/09/2015      12/11/2015 Pathology Results    - INVASIVE GRADE III DUCTAL CARCINOMA, SPANNING 2.3 CM IN GREATEST DIMENSION. - ASSOCIATED HIGH GRADE DCIS - MARGINS ARE NEGATIVE. - TWO LYMPH NODES POSITIVE FOR METASTATIC DUCTAL CARCINOMA  (2/2). 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   She has started radiation and patient reports last treatment will be Dec. 12. She has no complaints. She is feeling much better. Appetite is excellent. Energy is improving.   [name] has some skin irritation from radiation. She continues to report hot flashes, she has still not had a menstrual cycle.   Denies neuropathy. No headaches.     Review of Systems  Constitutional: Negative for chills, diaphoresis, fever, malaise/fatigue and weight loss.  HENT: Negative for congestion and sore throat.   Eyes: Negative for blurred vision, double vision and redness.  Respiratory: Negative  for cough, hemoptysis, sputum production, shortness of breath and wheezing.   Cardiovascular: Negative for chest pain, palpitations and leg swelling.  Gastrointestinal: Negative for abdominal pain, blood in stool, constipation, diarrhea, heartburn, melena and vomiting.  Genitourinary: Negative for dysuria, frequency and hematuria.  Musculoskeletal: Negative for back pain, falls, joint pain, myalgias and neck pain.  Skin: Negative for itching and rash.  Neurological: Negative for dizziness, tingling, tremors, sensory change, speech change, focal weakness and weakness.  Endo/Heme/Allergies: Negative.        Hot flashes  Psychiatric/Behavioral: Negative for depression, hallucinations, substance abuse and suicidal ideas. The patient is not  nervous/anxious and does not have insomnia.   14 point review of systems was performed and is negative except as detailed under history of present illness and above  HORMONAL RISK FACTORS:  Menarche was at age 43.  First live birth at age 49.  OCP use for approximately not sure how long she took birth control for, but reports that she has not taken in 36 years.  Ovaries intact: yes.  Hysterectomy: no.  Menopausal status: premenopausal, but periods are irregular. HRT use: 0 years. Colonoscopy: n/a; not examined. Mammogram within the last year: this was her first mammogram. Number of breast biopsies: 1. Up to date with pelvic exams: yes. Any excessive radiation exposure in the past: no   PAST MEDICAL HISTORY:   Past Medical History:  Diagnosis Date  . Breast cancer, left ([place]) 10/22/2015  . GERD (gastroesophageal reflux disease)   . Iron deficiency anemia 10/27/2015    ALLERGIES: No Known Allergies    MEDICATIONS: I have reviewed the patient's current medications.    Current Outpatient Prescriptions on File Prior to Visit  Medication Sig Dispense Refill  . hyaluronate sodium (RADIAPLEXRX) GEL Apply 1 application topically 2 (two) times daily.    Marland Kitchen lidocaine-prilocaine (EMLA) cream Apply a quarter size amount to port site 1 hour prior to chemo. Do not rub in. Cover with plastic wrap. 30 g 3  . non-metallic deodorant (ALRA) MISC Apply 1 application topically daily as needed.    . propranolol (INDERAL) 20 MG tablet 1 po bid. Tome una tableta por boca dos veces diarias (Patient not taking: Reported on 07/07/2016) 60 tablet 3   No current facility-administered medications on file prior to visit.      PAST SURGICAL HISTORY Past Surgical History:  Procedure Laterality Date  . MASTECTOMY MODIFIED RADICAL Left 12/09/2015   Procedure: LEFT MODIFIED RADICAL MASTECTOMY;  Surgeon: Aviva Signs, MD;  Location: [facility];  Service: General;  Laterality: Left;  . PORTACATH PLACEMENT Right  12/09/2015   Procedure: INSERTION PORT-A-CATH RIGHT SUBCLAVIAN;  Surgeon: Aviva Signs, MD;  Location: [facility];  Service: General;  Laterality: Right;    FAMILY HISTORY: Family History  Problem Relation Age of Onset  . Diabetes Mother   . Uterine cancer Paternal Aunt 1  . Heart attack Paternal Uncle     unspecified age  . Heart attack Paternal Grandfather 65  . Other Paternal Uncle     stomach swollen with fluid that had to be removed; lim info  Mother is alive at the age of 57. She has hypertension and diabetes. She lives in [place]. Father is deceased at unknown age and for unknown reason. She reports "he was old." Brothers 38 years old and healthy. She has many half siblings, many of whom live in [place]. She has 3 children. She is a 66 year old daughter who lives in [place], 87 year old son who lives here in  [place], and 48 year old daughter who lives in [place]. All children are healthy.  SOCIAL HISTORY: Patient denies any tobacco abuse. Rarely does she have alcoholic beverage. She denies any illicit drug abuse. 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 She is religious and Catholic. She is not a documented citizen of the [place]. She is married.  PERFORMANCE STATUS: The patient's performance status is 0 - Asymptomatic Vitals with BMI 06/02/2016  Height   Weight 147 lbs 3 oz  BMI   Systolic 188  Diastolic 83  Pulse 86  Respirations 16    PHYSICAL EXAM: General appearance: alert, cooperative, appears stated age, no distress and Hispanic, non-english speaking,  Alopecia Head: Normocephalic, without obvious abnormality, atraumatic Eyes: negative findings: lids and lashes normal, conjunctivae and sclerae normal and corneas clear Throat: lips, mucosa, and tongue normal; teeth and gums normal Neck: no adenopathy, supple, symmetrical, trachea midline and thyroid not enlarged, symmetric, no tenderness/mass/nodules Lungs: clear to auscultation  bilaterally and normal percussion bilaterally Breasts: Post mastectomy site is well healed Heart: regular rate and rhythm, S1, S2 normal, no murmur, click, rub or gallop Abdomen: soft, non-tender; bowel sounds normal; no masses,  no organomegaly Extremities: extremities normal, atraumatic, no cyanosis or edema Skin: Skin color, texture, turgor normal. No rashes or lesions Lymph nodes: Axillary adenopathy: left anterior line adenopathy Neurologic: Alert and oriented X 3, normal strength and tone. Normal symmetric reflexes. Normal coordination and gait  LABORATORY DATA:  I have reviewed the data as listed.   Results for GARI, TROVATO (MRN [id number]) as of 06/02/2016 14:37  Ref. Range 05/12/2016 11:37  Sodium Latest Ref Range: 135 - 145 mmol/L 138  Potassium Latest Ref Range: 3.5 - 5.1 mmol/L 3.6  Chloride Latest Ref Range: 101 - 111 mmol/L 107  CO2 Latest Ref Range: 22 - 32 mmol/L 24  BUN Latest Ref Range: 6 - 20 mg/dL 6  Creatinine Latest Ref Range: 0.44 - 1.00 mg/dL 0.50  Calcium Latest Ref Range: 8.9 - 10.3 mg/dL 9.3  EGFR (Non-African Amer.) Latest Ref Range: >60 mL/min >60  EGFR (African American) Latest Ref Range: >60 mL/min >60  Glucose Latest Ref Range: 65 - 99 mg/dL 129 (H)  Anion gap Latest Ref Range: 5 - 15  7  Alkaline Phosphatase Latest Ref Range: 38 - 126 U/L 79  Albumin Latest Ref Range: 3.5 - 5.0 g/dL 4.1  AST Latest Ref Range: 15 - 41 U/L 75 (H)  ALT Latest Ref Range: 14 - 54 U/L 133 (H)  Total Protein Latest Ref Range: 6.5 - 8.1 g/dL 6.9  Total Bilirubin Latest Ref Range: 0.3 - 1.2 mg/dL 0.5  WBC Latest Ref Range: 4.0 - 10.5 K/uL 5.1  RBC Latest Ref Range: 3.87 - 5.11 MIL/uL 3.78 (L)  Hemoglobin Latest Ref Range: 12.0 - 15.0 g/dL 11.1 (L)  HCT Latest Ref Range: 36.0 - 46.0 % 33.5 (L)  MCV Latest Ref Range: 78.0 - 100.0 fL 88.6  MCH Latest Ref Range: 26.0 - 34.0 pg 29.4  MCHC Latest Ref Range: 30.0 - 36.0 g/dL 33.1  RDW Latest Ref Range: 11.5 - 15.5 % 15.5    Platelets Latest Ref Range: 150 - 400 K/uL 374  Neutrophils Latest Units: % 66  Lymphocytes Latest Units: % 22  Monocytes Relative Latest Units: % 10  Eosinophil Latest Units: % 2  Basophil Latest Units: % 0  NEUT# Latest Ref Range: 1.7 - 7.7 K/uL 3.4  Lymphocyte # Latest Ref Range: 0.7 -  4.0 K/uL 1.1  Monocyte # Latest Ref Range: 0.1 - 1.0 K/uL 0.5  Eosinophils Absolute Latest Ref Range: 0.0 - 0.7 K/uL 0.1  Basophils Absolute Latest Ref Range: 0.0 - 0.1 K/uL 0.0    RADIOGRAPHY: I have personally reviewed the radiological images as listed and agreed with the findings in the report. Study Result     CLINICAL DATA: Left breast cancer  EXAM: CHEST 2 VIEW  COMPARISON: CT 10/27/2015  FINDINGS: Calcified granuloma in the right upper lobe, stable. Heart is normal size. No confluent opacities or effusions. No acute bony abnormality.  IMPRESSION: No active cardiopulmonary disease.   Electronically Signed  By: Rolm Baptise M.D.  On: 12/04/2015 11:53    PATHOLOGY:      Pathology from [facility]:  Biopsy of left breast mass at the 12:00 position demonstrates invasive ductal carcinoma, high-grade.  Left axillary lymph node biopsy demonstrates invasive ductal carcinoma, high-grade, consistent with metastasis to lymph node. Breast prognostic panel is performed demonstrating ER/PR negativity, HER-2/neu negativity, and a KI-67 Marker of 85%.   ASSESSMENT/PLAN:  Iron deficiency anemia She was severely iron deficienct and iron stores were replaced in preparation for chemotherapy. Iron levels will be followed.  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 She completed chemotherapy and has now started radiation. We will follow her LFT's now that she has completed chemotherapy, I suspect that her  mild transaminitis was treatment induced.   I told her that after she finishes radiation, we can refer her to plastic surgery for discussion of reconstruction. I will schedule an appointment with Dr. Arnoldo Morale for port removal. She wants her port out. Her appointment will be after Christmas.   I explained to patient that it may take time for hot flashes to subside. Her cycles may still resume.   I recommended the [employer] program, which has free membership for cancer patients.  Follow up with patient 6 weeks. After this next visit, I will space follow up visits to every 3 months.  Orders Placed This Encounter  Procedures  . CBC with Differential    Standing Status:   Future    Standing Expiration Date:   07/07/2017  . Comprehensive metabolic panel    Standing Status:   Future    Standing Expiration Date:   07/07/2017   All questions were answered. The patient knows to call the clinic with any problems, questions or concerns. We can certainly see the patient much sooner if necessary.  This document serves as a record of services personally performed by Ancil Linsey, MD. It was created on her behalf by Elmyra Ricks, a trained medical scribe. The creation of this record is based on the scribe's personal observations and the provider's statements to them. This document has been checked and approved by the attending provider..  I have reviewed the above documentation for accuracy and completeness, and I agree with the above.   This note is electronically signed by:  Molli Hazard, MD   07/10/2016 7:57 PM

## 2016-07-07 NOTE — Patient Instructions (Signed)
[place] at [facility] Discharge Instructions  RECOMMENDATIONS MADE BY THE CONSULTANT AND ANY TEST RESULTS WILL BE SENT TO YOUR REFERRING PHYSICIAN.  Port flush today. Exam with Dr. Whitney Muse today.  Return as scheduled for port flushes. Return as scheduled for lab work and office visit.   Thank you for choosing [place] at [facility] to provide your oncology and hematology care.  To afford each patient quality time with our provider, please arrive at least 15 minutes before your scheduled appointment time.   Beginning January 23rd 2017 lab work for the [facility] will be done in the  Main lab at [facility] on 1st floor. If you have a lab appointment with the [facility] please come in thru the  Main Entrance and check in at the main information desk  You need to re-schedule your appointment should you arrive 10 or more minutes late.  We strive to give you quality time with our providers, and arriving late affects you and other patients whose appointments are after yours.  Also, if you no show three or more times for appointments you may be dismissed from the clinic at the providers discretion.     Again, thank you for choosing [facility].  Our hope is that these requests will decrease the amount of time that you wait before being seen by our physicians.       _____________________________________________________________  Should you have questions after your visit to [facility], please contact our office at [phone] between the hours of 8:30 a.m. and 4:30 p.m.  Voicemails left after 4:30 p.m. will not be returned until the following business day.  For prescription refill requests, have your pharmacy contact our office.         Resources For Cancer Patients and their Caregivers ? American Cancer Society: Can assist with transportation, wigs, general needs, runs Look Good Feel Better.         (651) 197-0521 ? Cancer Care: Provides financial assistance, online support groups, medication/co-pay assistance.  1-800-813-HOPE (828)358-1001) ? [place] Assists [place] cancer patients and their families through emotional , educational and financial support.  (929)360-9427 ? [place] DSS Where to apply for food stamps, Medicaid and utility assistance. (586)140-6107 ? [facility]: Transportation to medical appointments. (520) 523-1543 ? Social Security Administration: May apply for disability if have a Stage IV cancer. 870-735-6174 424 283 2817 ? [employer], Disability and Transit Services: Assists with nutrition, care and transit needs. [place] Support Programs: @10RELATIVEDAYS @ > Cancer Support Group  2nd Tuesday of the month 1pm-2pm, Journey Room  > Creative Journey  3rd Tuesday of the month 1130am-1pm, Journey Room  > Look Good Feel Better  1st Wednesday of the month 10am-12 noon, Journey Room (Call [place] to register [phone])

## 2016-07-08 ENCOUNTER — Ambulatory Visit
Admission: RE | Admit: 2016-07-08 | Discharge: 2016-07-08 | Disposition: A | Discharge status: Home / Self Care | Payer: Self-pay | Source: Ambulatory Visit | Attending: Radiation Oncology | Admitting: Radiation Oncology

## 2016-07-10 ENCOUNTER — Encounter (HOSPITAL_COMMUNITY): Payer: Self-pay | Admitting: Hematology & Oncology

## 2016-07-11 ENCOUNTER — Encounter: Payer: Self-pay | Admitting: Radiation Oncology

## 2016-07-11 ENCOUNTER — Ambulatory Visit
Admission: RE | Admit: 2016-07-11 | Discharge: 2016-07-11 | Disposition: A | Discharge status: Home / Self Care | Payer: Self-pay | Source: Ambulatory Visit | Attending: Radiation Oncology | Admitting: Radiation Oncology

## 2016-07-11 VITALS — BP 119/86 | HR 75 | Temp 97.6°F | Resp 16 | Wt 148.8 lb

## 2016-07-11 DIAGNOSIS — Z171 Estrogen receptor negative status [ER-]: Secondary | ICD-10-CM

## 2016-07-11 DIAGNOSIS — C50112 Malignant neoplasm of central portion of left female breast: Secondary | ICD-10-CM

## 2016-07-11 NOTE — Progress Notes (Signed)
Weekly rad txs left chest wall/axilla, breast area,  27/28 completed, erythema, dryness under axilla where has peeled, no pain, using radiaplex bid, 1 month f/u appt with Bryson Ha 08/25/16, no interpreter today, patient speaks good english 10:25 AM BP 119/86 (BP Location: Right Arm, Patient Position: Sitting, Cuff Size: Normal)   Pulse 75   Temp 97.6 F (36.4 C) (Oral)   Resp 16   Wt 148 lb 12.8 oz (67.5 kg)   BMI 27.22 kg/m   Wt Readings from Last 3 Encounters:  07/11/16 148 lb 12.8 oz (67.5 kg)  07/07/16 150 lb 3.2 oz (68.1 kg)  07/04/16 146 lb 3.2 oz (66.3 kg)

## 2016-07-11 NOTE — Progress Notes (Signed)
   Department of Radiation Oncology  Phone:  [phone] Fax:        [phone]  Weekly Treatment Note    Name: Michele Salas Date: 07/11/2016 MRN: [id number] DOB: 1981/04/09   Diagnosis:     ICD-9-CM ICD-10-CM   1. Malignant neoplasm of central portion of left breast in female, estrogen receptor negative (HCC) 174.1 C50.112    V86.1 Z17.1      Current dose: 48.6 Gy  Current fraction: 27   MEDICATIONS: Current Outpatient Prescriptions  Medication Sig Dispense Refill  . hyaluronate sodium (RADIAPLEXRX) GEL Apply 1 application topically 2 (two) times daily.    Marland Kitchen lidocaine-prilocaine (EMLA) cream Apply a quarter size amount to port site 1 hour prior to chemo. Do not rub in. Cover with plastic wrap. 30 g 3  . non-metallic deodorant (ALRA) MISC Apply 1 application topically daily as needed.    . propranolol (INDERAL) 20 MG tablet 1 po bid. Tome una tableta por boca dos veces diarias 60 tablet 3   No current facility-administered medications for this encounter.      ALLERGIES: Patient has no known allergies.   LABORATORY DATA:  Lab Results  Component Value Date   WBC 5.1 05/12/2016   HGB 11.1 (L) 05/12/2016   HCT 33.5 (L) 05/12/2016   MCV 88.6 05/12/2016   PLT 374 05/12/2016   Lab Results  Component Value Date   NA 138 05/12/2016   K 3.6 05/12/2016   CL 107 05/12/2016   CO2 24 05/12/2016   Lab Results  Component Value Date   ALT 133 (H) 05/12/2016   AST 75 (H) 05/12/2016   ALKPHOS 79 05/12/2016   BILITOT 0.5 05/12/2016     NARRATIVE: Michele Salas was seen today for weekly treatment management. The chart was checked and the patient's films were reviewed.  The patient has completed 27 out of 28 treatments to the left chest wall. She denies pain. She reports using radiaplex bid for erythema and dryness and peeling under axilla   Wt Readings from Last 3 Encounters:  07/11/16 148 lb 12.8 oz (67.5 kg)  07/07/16 150 lb 3.2 oz (68.1 kg)  07/04/16  146 lb 3.2 oz (66.3 kg)  BP 119/86 (BP Location: Right Arm, Patient Position: Sitting, Cuff Size: Normal)   Pulse 75   Temp 97.6 F (36.4 C) (Oral)   Resp 16   Wt 148 lb 12.8 oz (67.5 kg)   BMI 27.22 kg/m   PHYSICAL EXAMINATION: weight is 148 lb 12.8 oz (67.5 kg). Her oral temperature is 97.6 F (36.4 C). Her blood pressure is 119/86 and her pulse is 75. Her respiration is 16.  Diffuse hyperpigmentation in the chest wall area. No moist desquamation present.  ASSESSMENT: The patient is doing satisfactorily with treatment.  PLAN: We will continue with the patient's radiation treatment as planned.   ------------------------------------------------  Jodelle Gross, MD, PhD  This document serves as a record of services personally performed by Kyung Rudd, MD. It was created on his behalf by Bethann Humble, a trained medical scribe. The creation of this record is based on the scribe's personal observations and the provider's statements to them. This document has been checked and approved by the attending provider.

## 2016-07-12 ENCOUNTER — Ambulatory Visit
Admission: RE | Admit: 2016-07-12 | Discharge: 2016-07-12 | Disposition: A | Discharge status: Home / Self Care | Payer: Self-pay | Source: Ambulatory Visit | Attending: Radiation Oncology | Admitting: Radiation Oncology

## 2016-07-13 ENCOUNTER — Ambulatory Visit
Admission: RE | Admit: 2016-07-13 | Discharge: 2016-07-13 | Disposition: A | Discharge status: Home / Self Care | Payer: Self-pay | Source: Ambulatory Visit | Attending: Radiation Oncology | Admitting: Radiation Oncology

## 2016-07-14 ENCOUNTER — Ambulatory Visit
Admission: RE | Admit: 2016-07-14 | Discharge: 2016-07-14 | Disposition: A | Discharge status: Home / Self Care | Payer: Self-pay | Source: Ambulatory Visit | Attending: Radiation Oncology | Admitting: Radiation Oncology

## 2016-07-15 ENCOUNTER — Ambulatory Visit
Admission: RE | Admit: 2016-07-15 | Discharge: 2016-07-15 | Disposition: A | Discharge status: Home / Self Care | Payer: Self-pay | Source: Ambulatory Visit | Attending: Radiation Oncology | Admitting: Radiation Oncology

## 2016-07-15 ENCOUNTER — Encounter: Payer: Self-pay | Admitting: Radiation Oncology

## 2016-07-15 VITALS — BP 118/75 | HR 93 | Temp 98.3°F | Resp 16 | Wt 148.8 lb

## 2016-07-15 DIAGNOSIS — Z171 Estrogen receptor negative status [ER-]: Secondary | ICD-10-CM

## 2016-07-15 DIAGNOSIS — C50112 Malignant neoplasm of central portion of left female breast: Secondary | ICD-10-CM

## 2016-07-15 NOTE — Progress Notes (Addendum)
   Department of Radiation Oncology  Phone:  [phone] Fax:        [phone]  Weekly Treatment Note    Name: Michele Salas Date: 07/15/2016 MRN: [id number] DOB: 24-Jan-1981   Diagnosis:     ICD-9-CM ICD-10-CM   1. Malignant neoplasm of central portion of left breast in female, estrogen receptor negative (HCC) 174.1 C50.112    V86.1 Z17.1      Current dose: 56.4 Gy  Current fraction: 31   MEDICATIONS: Current Outpatient Prescriptions  Medication Sig Dispense Refill  . hyaluronate sodium (RADIAPLEXRX) GEL Apply 1 application topically 2 (two) times daily.    Marland Kitchen lidocaine-prilocaine (EMLA) cream Apply a quarter size amount to port site 1 hour prior to chemo. Do not rub in. Cover with plastic wrap. 30 g 3  . non-metallic deodorant (ALRA) MISC Apply 1 application topically daily as needed.    . propranolol (INDERAL) 20 MG tablet 1 po bid. Tome una tableta por boca dos veces diarias 60 tablet 3   No current facility-administered medications for this encounter.      ALLERGIES: Patient has no known allergies.   LABORATORY DATA:  Lab Results  Component Value Date   WBC 5.1 05/12/2016   HGB 11.1 (L) 05/12/2016   HCT 33.5 (L) 05/12/2016   MCV 88.6 05/12/2016   PLT 374 05/12/2016   Lab Results  Component Value Date   NA 138 05/12/2016   K 3.6 05/12/2016   CL 107 05/12/2016   CO2 24 05/12/2016   Lab Results  Component Value Date   ALT 133 (H) 05/12/2016   AST 75 (H) 05/12/2016   ALKPHOS 79 05/12/2016   BILITOT 0.5 05/12/2016     NARRATIVE: Michele Salas was seen today for weekly treatment management. The chart was checked and the patient's films were reviewed.  Weekly rad txs left chest wall/axilla, dry desquamation, hyperpigmentation,, using radiaplex bid, no c/o pain, appetite good, patient stated ,no interpreter again, patient speaks some english understands and answers  1 month f/u appt given  10:38 AM BP 118/75 (BP Location: Right Arm, Patient  Position: Sitting, Cuff Size: Normal)   Pulse 93   Temp 98.3 F (36.8 C) (Oral)   Resp 16   Wt 148 lb 12.8 oz (67.5 kg)   BMI 27.22 kg/m   PHYSICAL EXAMINATION: weight is 148 lb 12.8 oz (67.5 kg). Her oral temperature is 98.3 F (36.8 C). Her blood pressure is 118/75 and her pulse is 93. Her respiration is 16.      Diffuse hyperpigmentation and dry desquamation in the treatment area. No moist desquamation.  ASSESSMENT: The patient is doing satisfactorily with treatment.  PLAN: We will continue with the patient's radiation treatment as planned. The patient will continue using skin cream until she sees [place] once again for follow-up in one month. She will begin using vitamin E oil after she runs out of her current skin cream.

## 2016-07-15 NOTE — Progress Notes (Signed)
  Radiation Oncology         [phone] ________________________________  Name: Michele Salas MRN: [id number]  Date: 07/07/2016  DOB: [birth year]  Complex simulation note  The patient has undergone complex simulation for her upcoming boost treatment for her diagnosis of left sided breast cancer. The patient has initially been planned to receive 50.4 Gy to the chest wall. The patient will now receive a 10 Gy boost to the mastectomy scar which has been identified. This will be accomplished using an en face electron field. Based on the depth of the target area, 6 MeV electrons will be used. The patient's final total dose therefore will be 60.4 Gy. A special port plan is requested for the boost treatment.   _______________________________  Jodelle Gross, MD, PhD

## 2016-07-15 NOTE — Progress Notes (Signed)
Weekly rad txs left chest wall/axilla, dry desquamation, hyperpigmentation,, using radiaplex bid, no c/o pain, appetite good, patient stated ,no interpreter again, patient speaks some english understands and answers  1 month f/u appt given  10:24 AM BP 118/75 (BP Location: Right Arm, Patient Position: Sitting, Cuff Size: Normal)   Pulse 93   Temp 98.3 F (36.8 C) (Oral)   Resp 16   Wt 148 lb 12.8 oz (67.5 kg)   BMI 27.22 kg/m

## 2016-07-18 ENCOUNTER — Ambulatory Visit
Admission: RE | Admit: 2016-07-18 | Discharge: 2016-07-18 | Disposition: A | Discharge status: Home / Self Care | Payer: Self-pay | Source: Ambulatory Visit | Attending: Radiation Oncology | Admitting: Radiation Oncology

## 2016-07-19 ENCOUNTER — Encounter: Payer: Self-pay | Admitting: Radiation Oncology

## 2016-07-19 ENCOUNTER — Ambulatory Visit
Admission: RE | Admit: 2016-07-19 | Discharge: 2016-07-19 | Disposition: A | Discharge status: Home / Self Care | Payer: Self-pay | Source: Ambulatory Visit | Attending: Radiation Oncology | Admitting: Radiation Oncology

## 2016-07-21 NOTE — Progress Notes (Signed)
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 Malignant neoplasm of central portion of left breast in female, estrogen receptor negative (HCC) 174.1 C50.112    V86.1 Z17.1     Indication for treatment:  Curative       Radiation treatment dates:   06/02/2016 to 07/19/2016  Site/dose:    1. The Left SCLV was treated to 50.4 Gy in 28 fractions at 1.8 Gy per fraction. 2. The Left chest wall was treated to 50.4 Gy in 28 fractions at 1.8 Gy per fraction. 3. The Left chest wall scar was boosted to 10 Gy in 5 fractions at 2 Gy per fraction.   Beams/energy:    1. 3D // 10X, 6X 2. 3D // 10X, 6X 3. Depth dose // 6 MeV  Narrative: The patient tolerated radiation treatment relatively well.  The patient developed diffuse hyperpigmentation and dry desquamation in the treatment area. No moist desquamation and no complaint of pain.    Plan: The patient has completed radiation treatment. The patient will return to radiation oncology clinic for routine followup in one month. I advised them to call or return sooner if they have any questions or concerns related to their recovery or treatment.  ------------------------------------------------  Jodelle Gross, MD, PhD  This document serves as a record of services personally performed by Kyung Rudd, MD. It was created on his behalf by Arlyce Harman, a trained medical scribe. The creation of this record is based on the scribe's personal observations and the provider's statements to them. This document has been checked and approved by the attending provider.

## 2016-08-19 ENCOUNTER — Encounter (HOSPITAL_COMMUNITY): Payer: Self-pay | Admitting: Lab

## 2016-08-19 ENCOUNTER — Encounter (HOSPITAL_COMMUNITY): Payer: Self-pay | Admitting: Oncology

## 2016-08-19 ENCOUNTER — Encounter (HOSPITAL_COMMUNITY): Payer: Self-pay | Attending: Oncology | Admitting: Oncology

## 2016-08-19 ENCOUNTER — Encounter (HOSPITAL_COMMUNITY): Payer: Self-pay

## 2016-08-19 VITALS — BP 131/88 | HR 87 | Temp 98.1°F | Resp 16 | Wt 149.5 lb

## 2016-08-19 DIAGNOSIS — Z8249 Family history of ischemic heart disease and other diseases of the circulatory system: Secondary | ICD-10-CM | POA: Insufficient documentation

## 2016-08-19 DIAGNOSIS — C50112 Malignant neoplasm of central portion of left female breast: Secondary | ICD-10-CM

## 2016-08-19 DIAGNOSIS — G4700 Insomnia, unspecified: Secondary | ICD-10-CM | POA: Insufficient documentation

## 2016-08-19 DIAGNOSIS — Z7722 Contact with and (suspected) exposure to environmental tobacco smoke (acute) (chronic): Secondary | ICD-10-CM | POA: Insufficient documentation

## 2016-08-19 DIAGNOSIS — Z923 Personal history of irradiation: Secondary | ICD-10-CM | POA: Insufficient documentation

## 2016-08-19 DIAGNOSIS — Z171 Estrogen receptor negative status [ER-]: Secondary | ICD-10-CM

## 2016-08-19 DIAGNOSIS — Z9221 Personal history of antineoplastic chemotherapy: Secondary | ICD-10-CM | POA: Insufficient documentation

## 2016-08-19 DIAGNOSIS — Z808 Family history of malignant neoplasm of other organs or systems: Secondary | ICD-10-CM | POA: Insufficient documentation

## 2016-08-19 DIAGNOSIS — K5900 Constipation, unspecified: Secondary | ICD-10-CM | POA: Insufficient documentation

## 2016-08-19 DIAGNOSIS — Z9889 Other specified postprocedural states: Secondary | ICD-10-CM | POA: Insufficient documentation

## 2016-08-19 DIAGNOSIS — Z9012 Acquired absence of left breast and nipple: Secondary | ICD-10-CM | POA: Insufficient documentation

## 2016-08-19 DIAGNOSIS — Z833 Family history of diabetes mellitus: Secondary | ICD-10-CM | POA: Insufficient documentation

## 2016-08-19 DIAGNOSIS — R7989 Other specified abnormal findings of blood chemistry: Secondary | ICD-10-CM | POA: Insufficient documentation

## 2016-08-19 DIAGNOSIS — R748 Abnormal levels of other serum enzymes: Secondary | ICD-10-CM

## 2016-08-19 DIAGNOSIS — K219 Gastro-esophageal reflux disease without esophagitis: Secondary | ICD-10-CM | POA: Insufficient documentation

## 2016-08-19 LAB — CBC WITH DIFFERENTIAL/PLATELET
Basophils Absolute: 0 10*3/uL (ref 0.0–0.1)
Basophils Relative: 1 %
Eosinophils Absolute: 0.1 10*3/uL (ref 0.0–0.7)
Eosinophils Relative: 2 %
HCT: 38.2 % (ref 36.0–46.0)
Hemoglobin: 12.5 g/dL (ref 12.0–15.0)
Lymphocytes Relative: 28 %
Lymphs Abs: 1.3 10*3/uL (ref 0.7–4.0)
MCH: 25.4 pg — ABNORMAL LOW (ref 26.0–34.0)
MCHC: 32.7 g/dL (ref 30.0–36.0)
MCV: 77.5 fL — ABNORMAL LOW (ref 78.0–100.0)
Monocytes Absolute: 0.4 10*3/uL (ref 0.1–1.0)
Monocytes Relative: 8 %
Neutro Abs: 2.8 10*3/uL (ref 1.7–7.7)
Neutrophils Relative %: 61 %
Platelets: 321 10*3/uL (ref 150–400)
RBC: 4.93 MIL/uL (ref 3.87–5.11)
RDW: 14.7 % (ref 11.5–15.5)
WBC: 4.6 10*3/uL (ref 4.0–10.5)

## 2016-08-19 LAB — COMPREHENSIVE METABOLIC PANEL
ALT: 323 U/L — ABNORMAL HIGH (ref 14–54)
AST: 199 U/L — ABNORMAL HIGH (ref 15–41)
Albumin: 4.3 g/dL (ref 3.5–5.0)
Alkaline Phosphatase: 120 U/L (ref 38–126)
Anion gap: 9 (ref 5–15)
BUN: 7 mg/dL (ref 6–20)
CO2: 26 mmol/L (ref 22–32)
Calcium: 9.7 mg/dL (ref 8.9–10.3)
Chloride: 102 mmol/L (ref 101–111)
Creatinine, Ser: 0.52 mg/dL (ref 0.44–1.00)
GFR calc Af Amer: >60 mL/min (ref >60)
GFR calc non Af Amer: >60 mL/min (ref >60)
Glucose, Bld: 128 mg/dL — ABNORMAL HIGH (ref 65–99)
Potassium: 3.7 mmol/L (ref 3.5–5.1)
Sodium: 137 mmol/L (ref 135–145)
Total Bilirubin: 0.8 mg/dL (ref 0.3–1.2)
Total Protein: 7.7 g/dL (ref 6.5–8.1)

## 2016-08-19 NOTE — Assessment & Plan Note (Addendum)
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 Arnoldo Morale on 12/09/2015.  S/P AC x 4 cycles (12/25/2015- 02/12/2016) followed by Paclitaxel x 12 cycles (02/25/2016- 05/12/2016), followed by XRT by Dr. Lisbeth Renshaw in [place] (06/01/2016- 07/19/2016).  Oncology history is updated.  Labs today: CBC diff, CMET, estradiol, and FSH.  I personally reviewed and went over laboratory results with the patient.  The results are noted within this dictation.   Liver enzymes are abnormal today.  Acute hepatitis panel is added today.  This will be added to her previously performed blood work.  Based upon result, further lab testing will be completed and referral to GI if needed.  She is due for mammogram in Feb 2018.  Order is placed for diagnostic mammogram.  She is given a constipation sheet to follow.  She notes issues with insomnia.  She is provided some OTC treatment options:  Melatonin  Unisom  Benadryl  Valerian Root If ineffective, then she is advised to follow-up with primary care provider to discuss other options.  She has not yet been referred to Plastic Surgery for consideration of reconstruction.  Referral will be made today.  She is advised that it will be in [place].  Return in 12 weeks for follow-up.

## 2016-08-19 NOTE — Progress Notes (Unsigned)
Referral sent to Dr Leland Johns for plastic surg.  Records faxed on 1/12, they office will contact patient.

## 2016-08-19 NOTE — Progress Notes (Signed)
Michele Dryer, PA-C [place] [address]  Malignant neoplasm of central portion of left breast in female, estrogen receptor negative ([place]) - Plan: CBC with Differential, Comprehensive metabolic panel, MM DIAG BREAST TOMO BILATERAL  Liver enzyme elevation - Plan: Hepatitis panel, acute, Hepatitis panel, acute  CURRENT THERAPY: Surveillance per NCCN guidelines  INTERVAL HISTORY: Michele Salas 36 y.o. 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 Arnoldo Morale on 12/09/2015.      Cancer of central portion of left female breast ([place])   09/16/2015 Mammogram    1.6 cm retroareolar left breast mass measuring 1.6 cm in largest diameter in addition to 3 abnormal appearing left inferior axillary lymph nodes.      09/23/2015 Initial Biopsy    US guided core biopsy of left breast mass and suspicious left axillary lymph nodes.      09/23/2015 Pathology Results    Invasive ductal carcinoma, high grade.  Left axilla lymph node is positive for metastatic carcinoma.  ER/PR NEGATIVE, HER2 NEGATIVE, KI-67 marker of 85%.      10/26/2015 Imaging    Bone scan- No evidence of metastatic disease to the skeleton.      10/27/2015 Imaging    CT CAP-  Retroareolar mass with 2 asymmetrically prominent left axillary lymph nodes and an upper normal size left subpectoral lymph node. No other findings of metastatic disease in the chest, abdomen, or pelvis.      11/05/2015 Echocardiogram    The estimated ejection fraction was in the range of 60% to 65%.      11/23/2015 Miscellaneous    Genetic Counseling- genetic test result was negative for any known pathogenic mutations within any of 28 genes that would cause her to be at an increased genetic risk for breast, ovarian, or other related cancers.      12/09/2015 Surgery    L radical mastectomy,  axillary LN dissection, port a cath insertion with Dr. Aviva Signs 12/09/2015      12/11/2015 Pathology Results    - INVASIVE GRADE III DUCTAL CARCINOMA, SPANNING 2.3 CM IN GREATEST DIMENSION. - ASSOCIATED HIGH GRADE DCIS - MARGINS ARE NEGATIVE. 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 - EIGHT ADDITIONAL LYMPH NODES WITH NO TUMOR SEEN      12/11/2015 Pathology Results    HER2 NEGATIVE, ER 0%, PR 0%, Ki-67 marker 90%      12/25/2015 - 02/12/2016 Chemotherapy    AC every 2 weeks x 4 cycles      02/04/2016 Treatment Plan Change    Treatment deferred x 7 days due to neutropenia      02/25/2016 - 05/12/2016 Chemotherapy    Paclitaxel weekly x 12      06/01/2016 - 07/19/2016 Radiation Therapy    33 fractions, Dr. Lisbeth Renshaw in [place]       She reports radiation-induced fatigue.  She notes that it is improving.  She reports that radiation was easier than chemotherapy.    She notes left scapular pain.  No rash.  It comes and goes.    She reports issues with constipation.  She has not tried any OTC treatment for this.  She notes issues with sleeping.  She reports that she falls asleep, but sometimes she will have a hot flash in bed that wakes her.  Then she cannot  fall back asleep.  She has not yet seen a Psychiatric nurse for consideration of reconstruction.  Her ALT/AST are significantly elevated.  She reports an episode when she was told her skin was yellow during childhood.  She denies any EtOH abuse.  She denies any breast complaints or concerns.  Review of Systems  Constitutional: Negative.  Negative for chills, fever, malaise/fatigue and weight loss.  HENT: Negative.   Eyes: Negative.  Negative for blurred vision and double vision.  Respiratory: Negative.  Negative for cough.   Cardiovascular: Negative.  Negative for chest pain.  Gastrointestinal: Positive for constipation. Negative for abdominal pain, blood in stool, diarrhea, melena, nausea and vomiting.    Genitourinary: Negative.   Musculoskeletal: Negative.   Skin: Negative.  Negative for rash.  Neurological: Negative.  Negative for weakness.  Endo/Heme/Allergies: Negative.   Psychiatric/Behavioral: The patient has insomnia.     Past Medical History:  Diagnosis Date  . Breast cancer, left ([place]) 10/22/2015  . GERD (gastroesophageal reflux disease)   . Iron deficiency anemia 10/27/2015    Past Surgical History:  Procedure Laterality Date  . MASTECTOMY MODIFIED RADICAL Left 12/09/2015   Procedure: LEFT MODIFIED RADICAL MASTECTOMY;  Surgeon: Aviva Signs, MD;  Location: [facility];  Service: General;  Laterality: Left;  . PORTACATH PLACEMENT Right 12/09/2015   Procedure: INSERTION PORT-A-CATH RIGHT SUBCLAVIAN;  Surgeon: Aviva Signs, MD;  Location: [facility];  Service: General;  Laterality: Right;    Family History  Problem Relation Age of Onset  . Diabetes Mother   . Uterine cancer Paternal Aunt 26  . Heart attack Paternal Uncle     unspecified age  . Heart attack Paternal Grandfather 3  . Other Paternal Uncle     stomach swollen with fluid that had to be removed; lim info    Social History   Social History  . Marital status: Married    Spouse name: N/A  . Number of children: N/A  . Years of education: N/A   Social History Main Topics  . Smoking status: Passive Smoke Exposure - Never Smoker  . Smokeless tobacco: Never Used  . Alcohol use No  . Drug use: No  . Sexual activity: Yes    Birth control/ protection: None   Other Topics Concern  . None   Social History Narrative  . None     PHYSICAL EXAMINATION  ECOG PERFORMANCE STATUS: 0 - Asymptomatic  Vitals:   08/19/16 1148  BP: 131/88  Pulse: 87  Resp: 16  Temp: 98.1 F (36.7 C)    GENERAL:alert, no distress, well nourished, well developed, comfortable, cooperative, obese, smiling and interpreter present, Truitt Merle. SKIN: skin color, texture, turgor are normal, no rashes or significant lesions HEAD:  Normocephalic, No masses, lesions, tenderness or abnormalities EYES: normal, EOMI, Conjunctiva are pink and non-injected EARS: External ears normal OROPHARYNX:lips, buccal mucosa, and tongue normal and mucous membranes are moist  NECK: supple, no adenopathy, trachea midline LYMPH:  no palpable lymphadenopathy BREAST:not examined LUNGS: clear to auscultation and percussion HEART: regular rate & rhythm, no murmurs, no gallops, S1 normal and S2 normal ABDOMEN:abdomen soft, non-tender and normal bowel sounds BACK: Back symmetric, no curvature. EXTREMITIES:less then 2 second capillary refill, no joint deformities, effusion, or inflammation, no skin discoloration, no cyanosis  NEURO: alert & oriented x 3 with fluent speech, no focal motor/sensory deficits, gait normal   LABORATORY DATA: CBC    Component Value Date/Time   WBC 4.6 08/19/2016 1048   RBC 4.93 08/19/2016 1048  HGB 12.5 08/19/2016 1048   HCT 38.2 08/19/2016 1048   PLT 321 08/19/2016 1048   MCV 77.5 (L) 08/19/2016 1048   MCH 25.4 (L) 08/19/2016 1048   MCHC 32.7 08/19/2016 1048   RDW 14.7 08/19/2016 1048   LYMPHSABS 1.3 08/19/2016 1048   MONOABS 0.4 08/19/2016 1048   EOSABS 0.1 08/19/2016 1048   BASOSABS 0.0 08/19/2016 1048      Chemistry      Component Value Date/Time   NA 137 08/19/2016 1048   K 3.7 08/19/2016 1048   CL 102 08/19/2016 1048   CO2 26 08/19/2016 1048   BUN 7 08/19/2016 1048   CREATININE 0.52 08/19/2016 1048      Component Value Date/Time   CALCIUM 9.7 08/19/2016 1048   ALKPHOS 120 08/19/2016 1048   AST 199 (H) 08/19/2016 1048   ALT 323 (H) 08/19/2016 1048   BILITOT 0.8 08/19/2016 1048        PENDING LABS:   RADIOGRAPHIC STUDIES:  No results found.   PATHOLOGY:    ASSESSMENT AND PLAN:  Cancer of central portion of left female breast ([facility]) Stage IIB invasive ductal carcinoma of left breast, ER0%, PR 0%, HER2 NEGATIVE, measuring 2.3 cm with 2/10 positive lymph nodes; S/P left  mastectomy by Dr. Arnoldo Morale on 12/09/2015.  S/P AC x 4 cycles (12/25/2015- 02/12/2016) followed by Paclitaxel x 12 cycles (02/25/2016- 05/12/2016), followed by XRT by Dr. Lisbeth Renshaw in [place] (06/01/2016- 07/19/2016).  Oncology history is updated.  Labs today: CBC diff, CMET, estradiol, and FSH.  I personally reviewed and went over laboratory results with the patient.  The results are noted within this dictation.   Liver enzymes are abnormal today.  Acute hepatitis panel is added today.  This will be added to her previously performed blood work.  Based upon result, further lab testing will be completed and referral to GI if needed.  She is due for mammogram in Feb 2018.  Order is placed for diagnostic mammogram.  She is given a constipation sheet to follow.  She notes issues with insomnia.  She is provided some OTC treatment options:  Melatonin  Unisom  Benadryl  Valerian Root If ineffective, then she is advised to follow-up with primary care provider to discuss other options.  She has not yet been referred to Plastic Surgery for consideration of reconstruction.  Referral will be made today.  She is advised that it will be in [place].  Return in 12 weeks for follow-up.   ORDERS PLACED FOR THIS ENCOUNTER: Orders Placed This Encounter  Procedures  . MM DIAG BREAST TOMO BILATERAL  . CBC with Differential  . Comprehensive metabolic panel  . Hepatitis panel, acute    MEDICATIONS PRESCRIBED THIS ENCOUNTER: No orders of the defined types were placed in this encounter.   THERAPY PLAN:  NCCN guidelines recommends the following surveillance for invasive breast cancer (2.2017):  A. History and Physical exam 1-4 times per year as clinically appropriate for 5 years, then annually.  B. Periodic screening for changes in family history and referral to genetics counseling as indicated  C. Educate, monitor, and refer to lymphedema management.  D. Mammography every 12 months  E. Routine imaging of reconstructed  breast is not indicated.  F. In the absence of clinical signs and symptoms suggestive of recurrent disease, there is no indication for laboratory or imaging studies for metastases screening.  G. Women on Tamoxifen: annual gynecologic assessment every 12 months if uterus is present.  H. Women on aromatase inhibitor  or who experience ovarian failure secondary to treatment should have monitoring of bone health with a bone mineral density determination at baseline and periodically thereafter.  I. Assess and encourage adherence to adjuvant endocrine therapy.  J. Evidence suggests that active lifestyle, healthy diet, limited alcohol intake, and achieving and maintaining an ideal body weight (20-25 BMI) may lead to optimal breast cancer outcomes.   All questions were answered. The patient knows to call the clinic with any problems, questions or concerns. We can certainly see the patient much sooner if necessary.  Patient and plan discussed with Dr. Ancil Linsey and she is in agreement with the aforementioned.   This note is electronically signed by: Doy Mince 08/19/2016 12:18 PM

## 2016-08-19 NOTE — Patient Instructions (Addendum)
[place] at [facility] Discharge Instructions  RECOMMENDATIONS MADE BY THE CONSULTANT AND ANY TEST RESULTS WILL BE SENT TO YOUR REFERRING PHYSICIAN.  You saw Kirby Crigler, PA-C, today. See PCP for sleeping problems.  You can use OTC sleeping aides such as: Melatonin, Benadryl, or Valerian Root  Referral to plastic surgery. Mammogram is due in February 2018. Follow up in 3 months. See Amy at checkout for appointments.  Thank you for choosing [place] at [facility] to provide your oncology and hematology care.  To afford each patient quality time with our provider, please arrive at least 15 minutes before your scheduled appointment time.    If you have a lab appointment with the [facility] please come in thru the  Main Entrance and check in at the main information desk  You need to re-schedule your appointment should you arrive 10 or more minutes late.  We strive to give you quality time with our providers, and arriving late affects you and other patients whose appointments are after yours.  Also, if you no show three or more times for appointments you may be dismissed from the clinic at the providers discretion.     Again, thank you for choosing [facility].  Our hope is that these requests will decrease the amount of time that you wait before being seen by our physicians.       _____________________________________________________________  Should you have questions after your visit to [facility], please contact our office at [phone] between the hours of 8:30 a.m. and 4:30 p.m.  Voicemails left after 4:30 p.m. will not be returned until the following business day.  For prescription refill requests, have your pharmacy contact our office.       Resources For Cancer Patients and their Caregivers ? American Cancer Society: Can assist with transportation, wigs, general needs, runs Look Good Feel Better.         603-255-4436 ? Cancer Care: Provides financial assistance, online support groups, medication/co-pay assistance.  1-800-813-HOPE 701-887-2630) ? [place] Assists [place] cancer patients and their families through emotional , educational and financial support.  [phone] ? [place] DSS Where to apply for food stamps, Medicaid and utility assistance. 269-153-5006 ? RCATS: Transportation to medical appointments. ([phone] 441-6017 ? Social Security Administration: May apply for disability if have a Stage IV cancer. 914-699-6752 563-689-3483 ? [employer], Disability and Transit Services: Assists with nutrition, care and transit needs. [place] Support Programs: @10RELATIVEDAYS @ > Cancer Support Group  2nd Tuesday of the month 1pm-2pm, Journey Room  > Creative Journey  3rd Tuesday of the month 1130am-1pm, Journey Room  > Look Good Feel Better  1st Wednesday of the month 10am-12 noon, Journey Room (Call [place] to register [phone])

## 2016-08-20 LAB — FOLLICLE STIMULATING HORMONE: FSH: 44.8 m[IU]/mL

## 2016-08-20 LAB — ESTRADIOL: Estradiol: <5 pg/mL

## 2016-08-22 ENCOUNTER — Other Ambulatory Visit (HOSPITAL_COMMUNITY): Payer: Self-pay | Admitting: Oncology

## 2016-08-22 ENCOUNTER — Other Ambulatory Visit: Payer: Self-pay | Admitting: Physician Assistant

## 2016-08-22 DIAGNOSIS — R748 Abnormal levels of other serum enzymes: Secondary | ICD-10-CM

## 2016-08-22 DIAGNOSIS — Z1231 Encounter for screening mammogram for malignant neoplasm of breast: Secondary | ICD-10-CM

## 2016-08-23 ENCOUNTER — Encounter (HOSPITAL_COMMUNITY): Payer: Self-pay

## 2016-08-23 DIAGNOSIS — R748 Abnormal levels of other serum enzymes: Secondary | ICD-10-CM

## 2016-08-23 DIAGNOSIS — Z171 Estrogen receptor negative status [ER-]: Secondary | ICD-10-CM

## 2016-08-23 DIAGNOSIS — C50112 Malignant neoplasm of central portion of left female breast: Secondary | ICD-10-CM

## 2016-08-23 LAB — COMPREHENSIVE METABOLIC PANEL
ALT: 270 U/L — ABNORMAL HIGH (ref 14–54)
AST: 159 U/L — ABNORMAL HIGH (ref 15–41)
Albumin: 4.1 g/dL (ref 3.5–5.0)
Alkaline Phosphatase: 115 U/L (ref 38–126)
Anion gap: 5 (ref 5–15)
BUN: 7 mg/dL (ref 6–20)
CO2: 27 mmol/L (ref 22–32)
Calcium: 9.4 mg/dL (ref 8.9–10.3)
Chloride: 106 mmol/L (ref 101–111)
Creatinine, Ser: 0.5 mg/dL (ref 0.44–1.00)
GFR calc Af Amer: >60 mL/min (ref >60)
GFR calc non Af Amer: >60 mL/min (ref >60)
Glucose, Bld: 101 mg/dL — ABNORMAL HIGH (ref 65–99)
Potassium: 3.8 mmol/L (ref 3.5–5.1)
Sodium: 138 mmol/L (ref 135–145)
Total Bilirubin: 0.5 mg/dL (ref 0.3–1.2)
Total Protein: 7.5 g/dL (ref 6.5–8.1)

## 2016-08-23 LAB — CBC WITH DIFFERENTIAL/PLATELET
Basophils Absolute: 0 10*3/uL (ref 0.0–0.1)
Basophils Relative: 1 %
Eosinophils Absolute: 0.2 10*3/uL (ref 0.0–0.7)
Eosinophils Relative: 3 %
HCT: 37.6 % (ref 36.0–46.0)
Hemoglobin: 12.4 g/dL (ref 12.0–15.0)
Lymphocytes Relative: 32 %
Lymphs Abs: 1.8 10*3/uL (ref 0.7–4.0)
MCH: 25.6 pg — ABNORMAL LOW (ref 26.0–34.0)
MCHC: 33 g/dL (ref 30.0–36.0)
MCV: 77.7 fL — ABNORMAL LOW (ref 78.0–100.0)
Monocytes Absolute: 0.5 10*3/uL (ref 0.1–1.0)
Monocytes Relative: 8 %
Neutro Abs: 3.2 10*3/uL (ref 1.7–7.7)
Neutrophils Relative %: 56 %
Platelets: 314 10*3/uL (ref 150–400)
RBC: 4.84 MIL/uL (ref 3.87–5.11)
RDW: 14.8 % (ref 11.5–15.5)
WBC: 5.7 10*3/uL (ref 4.0–10.5)

## 2016-08-24 LAB — HEPATITIS PANEL, ACUTE
HCV Ab: <0.1 s/co ratio (ref 0.0–0.9)
Hep A IgM: NEGATIVE
Hep B C IgM: NEGATIVE
Hepatitis B Surface Ag: NEGATIVE

## 2016-08-24 NOTE — Progress Notes (Signed)
[name] 36 y.o. woman with malignant neoplasm of central portion of left breast in female, estrogen receptor negative one month FU  Skin status:Left breast with hyperpigmentation What lotion are you using? Using lotion with vitamin E to left breast. Have you seen med onc? If not, when is appointment: 07-07-16 Dr. Ancil Linsey next 11-21-16 If they are ER+, have they started Al or Tamoxifen? If not, why? Triple negative Discuss survivorship appointment. Not scheduled Have you had a mammogram scheduled? Not scheduled yet Offer referral to [facility]. Will receive at the survivorship appointment. Oncotype Dx. Score:None found Appetite:Good Pain:No Fatigue:None Arm mobility:Able to raise left arm without difficult. Lymphedema:None Saw Dr. Aviva Signs for a follow up less than a month ago to remove her port-a-cath he did not remove the port-a-cath yet. Wt Readings from Last 3 Encounters:  08/30/16 147 lb 6.4 oz (66.9 kg)  08/19/16 149 lb 8 oz (67.8 kg)  07/15/16 148 lb 12.8 oz (67.5 kg)  BP 128/88   Pulse 93   Temp 97.8 F (36.6 C) (Oral)   Resp 18   Ht 5' 2"  (1.575 m)   Wt 147 lb 6.4 oz (66.9 kg)   SpO2 100%   BMI 26.96 kg/m

## 2016-08-30 ENCOUNTER — Ambulatory Visit
Admission: RE | Admit: 2016-08-30 | Discharge: 2016-08-30 | Disposition: A | Discharge status: Home / Self Care | Payer: Self-pay | Source: Ambulatory Visit | Attending: Radiation Oncology | Admitting: Radiation Oncology

## 2016-08-30 ENCOUNTER — Encounter: Payer: Self-pay | Admitting: Gastroenterology

## 2016-08-30 ENCOUNTER — Encounter: Payer: Self-pay | Admitting: Radiation Oncology

## 2016-08-30 VITALS — BP 128/88 | HR 93 | Temp 97.8°F | Resp 18 | Ht 62.0 in | Wt 147.4 lb

## 2016-08-30 DIAGNOSIS — Z9221 Personal history of antineoplastic chemotherapy: Secondary | ICD-10-CM | POA: Insufficient documentation

## 2016-08-30 DIAGNOSIS — Z79899 Other long term (current) drug therapy: Secondary | ICD-10-CM | POA: Insufficient documentation

## 2016-08-30 DIAGNOSIS — C50912 Malignant neoplasm of unspecified site of left female breast: Secondary | ICD-10-CM | POA: Insufficient documentation

## 2016-08-30 DIAGNOSIS — Z171 Estrogen receptor negative status [ER-]: Secondary | ICD-10-CM

## 2016-08-30 DIAGNOSIS — C50112 Malignant neoplasm of central portion of left female breast: Secondary | ICD-10-CM

## 2016-08-30 NOTE — Addendum Note (Signed)
Encounter addended by: Malena Edman, RN on: 08/30/2016 12:19 PM<BR>    Actions taken: Charge Capture section accepted

## 2016-08-30 NOTE — Progress Notes (Signed)
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 The Left SCLV was treated to 50.4 Gy in 28 fractions at 1.8 Gy per fraction. 2. The Left chest wall was treated to 50.4 Gy in 28 fractions at 1.8 Gy per fraction. 3. The Left chest wall scar was boosted to 10 Gy in 5 fractions at 2 Gy per fraction.   Narrative:  The patient returns today for routine follow-up. During treatment she did very well with radiotherapy and did not have significant desquamation.                             On review of systems, the patient states she is doing well and using vitamin e oil on her skin. She continues to have hot flashes since completing her chemotherapy. She is interested in returning to work, and requests a note stating she can return.  ALLERGIES:  has No Known Allergies.  Meds: Current Outpatient Prescriptions  Medication Sig Dispense Refill  . lidocaine-prilocaine (EMLA) cream Apply a quarter size amount to port site 1 hour prior to chemo. Do not rub in. Cover with plastic wrap. 30 g 3  . propranolol (INDERAL) 20 MG tablet 1 po bid. Tome una tableta por boca dos veces diarias 60 tablet 3   No current facility-administered medications for this encounter.     Physical Findings:  height is 5' 2"  (1.575 m) and weight is 147 lb 6.4 oz (66.9 kg). Her oral temperature is 97.8 F (36.6 C). Her blood pressure is 128/88 and her pulse is 93. Her respiration is 18 and oxygen saturation is 100%.  In general this is a well appearing hispanic female in no acute distress. She's alert and oriented x4 and appropriate throughout  the examination. Cardiopulmonary assessment is negative for acute distress and she exhibits normal effort. The left chest wall and mastectomy scar were examined and there is hyperpigmentation in the distribution of her radiation field. She does not have any chest wall edema, or lymphedema of the LUE, or desquamation.  Lab Findings: Lab Results  Component Value Date   WBC 5.7 08/23/2016   HGB 12.4 08/23/2016   HCT 37.6 08/23/2016   MCV 77.7 (L) 08/23/2016   PLT 314 08/23/2016     Radiographic Findings: No results found.  Impression/Plan: 1. 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 The patient has been doing well since completion of radiotherapy. We discussed that we would be happy to continue to follow her as needed, but she will also continue to follow up with Dr. Whitney Muse in medical oncology. She was counseled on skin care as well as measures to avoid sun exposure to this area.  2. Survivorship. She will be set up for surivivorship with the nurse practitioner in [facility] cancer [facility]. 3. Work Geneticist, molecular. I provided a letter stating she can return back to work without restriction, however may return to full time hours at her discretion.     Carola Rhine, PAC

## 2016-09-02 ENCOUNTER — Ambulatory Visit (HOSPITAL_COMMUNITY): Payer: Self-pay

## 2016-09-08 ENCOUNTER — Encounter: Payer: Self-pay | Admitting: Radiation Oncology

## 2016-09-16 ENCOUNTER — Ambulatory Visit (HOSPITAL_COMMUNITY): Payer: Self-pay

## 2016-09-16 ENCOUNTER — Encounter (HOSPITAL_COMMUNITY): Payer: Self-pay

## 2016-09-20 ENCOUNTER — Encounter (HOSPITAL_COMMUNITY): Payer: Self-pay

## 2016-09-20 ENCOUNTER — Encounter (HOSPITAL_COMMUNITY): Payer: Self-pay | Attending: Hematology & Oncology

## 2016-09-20 ENCOUNTER — Other Ambulatory Visit (HOSPITAL_COMMUNITY): Payer: Self-pay | Admitting: Oncology

## 2016-09-20 VITALS — BP 124/81 | HR 76 | Temp 97.7°F | Resp 18

## 2016-09-20 DIAGNOSIS — Z452 Encounter for adjustment and management of vascular access device: Secondary | ICD-10-CM

## 2016-09-20 DIAGNOSIS — Z79899 Other long term (current) drug therapy: Secondary | ICD-10-CM | POA: Insufficient documentation

## 2016-09-20 DIAGNOSIS — C773 Secondary and unspecified malignant neoplasm of axilla and upper limb lymph nodes: Secondary | ICD-10-CM

## 2016-09-20 DIAGNOSIS — C50912 Malignant neoplasm of unspecified site of left female breast: Secondary | ICD-10-CM | POA: Insufficient documentation

## 2016-09-20 DIAGNOSIS — R51 Headache: Secondary | ICD-10-CM | POA: Insufficient documentation

## 2016-09-20 DIAGNOSIS — N644 Mastodynia: Secondary | ICD-10-CM | POA: Insufficient documentation

## 2016-09-20 DIAGNOSIS — K219 Gastro-esophageal reflux disease without esophagitis: Secondary | ICD-10-CM | POA: Insufficient documentation

## 2016-09-20 DIAGNOSIS — Z95828 Presence of other vascular implants and grafts: Secondary | ICD-10-CM

## 2016-09-20 DIAGNOSIS — R1013 Epigastric pain: Secondary | ICD-10-CM | POA: Insufficient documentation

## 2016-09-20 DIAGNOSIS — Z1231 Encounter for screening mammogram for malignant neoplasm of breast: Secondary | ICD-10-CM

## 2016-09-20 DIAGNOSIS — C50112 Malignant neoplasm of central portion of left female breast: Secondary | ICD-10-CM

## 2016-09-20 MED ORDER — HEPARIN SOD (PORK) LOCK FLUSH 100 UNIT/ML IV SOLN
500.0000 [IU] | Freq: Once | INTRAVENOUS | Status: AC
  Administered 2016-09-20: 500 [IU] via INTRAVENOUS
  Filled 2016-09-20: qty 5

## 2016-09-20 MED ORDER — SODIUM CHLORIDE 0.9% FLUSH
10.0000 mL | Freq: Once | INTRAVENOUS | Status: AC
  Administered 2016-09-20: 10 mL via INTRAVENOUS

## 2016-09-20 NOTE — Patient Instructions (Signed)
[place] at [facility] Discharge Instructions  RECOMMENDATIONS MADE BY THE CONSULTANT AND ANY TEST RESULTS WILL BE SENT TO YOUR REFERRING PHYSICIAN.  Port flush today as ordered. Return as scheduled.  Thank you for choosing [place] at [facility] to provide your oncology and hematology care.  To afford each patient quality time with our provider, please arrive at least 15 minutes before your scheduled appointment time.    If you have a lab appointment with the [place] please come in thru the  Main Entrance and check in at the main information desk  You need to re-schedule your appointment should you arrive 10 or more minutes late.  We strive to give you quality time with our providers, and arriving late affects you and other patients whose appointments are after yours.  Also, if you no show three or more times for appointments you may be dismissed from the clinic at the providers discretion.     Again, thank you for choosing [facility].  Our hope is that these requests will decrease the amount of time that you wait before being seen by our physicians.       _____________________________________________________________  Should you have questions after your visit to [facility], please contact our office at [phone] between the hours of 8:30 a.m. and 4:30 p.m.  Voicemails left after 4:30 p.m. will not be returned until the following business day.  For prescription refill requests, have your pharmacy contact our office.       Resources For Cancer Patients and their Caregivers ? American Cancer Society: Can assist with transportation, wigs, general needs, runs Look Good Feel Better.        ([phone] ? Cancer Care: Provides financial assistance, online support groups, medication/co-pay assistance.  1-800-813-HOPE 313-522-6347) ? [place] Assists [place] cancer patients  and their families through emotional , educational and financial support.  8436455534 ? [place] DSS Where to apply for food stamps, Medicaid and utility assistance. 952-761-2868 ? RCATS: Transportation to medical appointments. 782 167 3025 ? Social Security Administration: May apply for disability if have a Stage IV cancer. 7405284898 580-725-3944 ? [employer], Disability and Transit Services: Assists with nutrition, care and transit needs. [place] Support Programs: @10RELATIVEDAYS @ > Cancer Support Group  2nd Tuesday of the month 1pm-2pm, Journey Room  > Creative Journey  3rd Tuesday of the month 1130am-1pm, Journey Room  > Look Good Feel Better  1st Wednesday of the month 10am-12 noon, Journey Room (Call [place] to register [phone])

## 2016-09-20 NOTE — Progress Notes (Signed)
Michele Salas presented for Portacath access and flush. Proper placement of portacath confirmed by CXR. Portacath located right chest wall accessed with  H 20 needle. Good blood return present. Portacath flushed with 57m NS and 500U/533mHeparin and needle removed intact. Procedure without incident. Patient tolerated procedure well.

## 2016-09-28 ENCOUNTER — Other Ambulatory Visit: Payer: Self-pay

## 2016-09-28 ENCOUNTER — Ambulatory Visit (INDEPENDENT_AMBULATORY_CARE_PROVIDER_SITE_OTHER): Payer: Self-pay | Admitting: Gastroenterology

## 2016-09-28 ENCOUNTER — Encounter: Payer: Self-pay | Admitting: Gastroenterology

## 2016-09-28 DIAGNOSIS — R7401 Elevation of levels of liver transaminase levels: Secondary | ICD-10-CM

## 2016-09-28 DIAGNOSIS — R740 Nonspecific elevation of levels of transaminase and lactic acid dehydrogenase [LDH]: Secondary | ICD-10-CM

## 2016-09-28 DIAGNOSIS — R748 Abnormal levels of other serum enzymes: Secondary | ICD-10-CM

## 2016-09-28 NOTE — Patient Instructions (Addendum)
1. Laboratorios completos  2. Completar la exploracin ct  3. La oficina lo llamar con sus resultados  4. Justice LIBRAS  5. Seguimiento en 3 meses

## 2016-09-28 NOTE — Progress Notes (Signed)
cc'ed to pcp °

## 2016-09-28 NOTE — Progress Notes (Signed)
   Subjective:    Patient ID: Michele Salas, female    DOB: [birth year], 36 y.o.   MRN: [id number] [name], PA-C   HPI  SPANISH SPEAKING ONLY-HISTORY OBTAINED VIA INTERPRETER(BLANCA)     May 2017: 134 lbs and now she weighs 152 lbs. No blood transfusions or tattoos. No FAMHx; liver disease, TB exposure, TYLENOL(NO), RECREATIONAL DRUGS(NO), NO ETOH(NO). NOFATIGUE, ITCHING ARMS, NECK, LEGS-EVERY DAY BUT ONCE A DAY. DOESN'T KEEP HER AWAKE AT NIGHT.  BMs: Q2 DAYS (BALLS, NL, DIARRHEA). OCCASIONAL LOWE ABDOMINAL PAIN: 1X/WK FOR 30 MINS. BETTER AFTER: PASSING GAS. FEELS LITTLE WHITE BALLS IN BACK OF HER THROAT. SEXUALLY: LIVE IN BOYFRIEND(x16 YRS, 2 KIDS: 14, 11) NO CLOSE CONTACT WITH LIVER DISEASE. ORIGINALLY FROM [place](IN [place] FOR 25 YRS). JOB: WORKING IN PLACE WITH WINDOWS.  PT DENIES FEVER, CHILLS, HEMATOCHEZIA, HEMATEMESIS, nausea, vomiting, melena, CHEST PAIN, SHORTNESS OF BREATH,  CHANGE IN BOWEL IN HABITS, problems swallowing, OR heartburn or indigestion.  Past Medical History:  Diagnosis Date  . Breast cancer, left ([place]) 10/22/2015  . GERD (gastroesophageal reflux disease)   . Iron deficiency anemia 10/27/2015    Past Surgical History:  Procedure Laterality Date  . MASTECTOMY MODIFIED RADICAL Left 12/09/2015   Procedure: LEFT MODIFIED RADICAL MASTECTOMY;  Surgeon: Aviva Signs, MD;  Location: [facility];  Service: General;  Laterality: Left;  . PORTACATH PLACEMENT Right 12/09/2015   Procedure: INSERTION PORT-A-CATH RIGHT SUBCLAVIAN;  Surgeon: Aviva Signs, MD;  Location: [facility];  Service: General;  Laterality: Right;   No Known Allergies  Current Outpatient Prescriptions  Medication Sig Dispense Refill  . lidocaine-prilocaine (EMLA) cream Apply a quarter size amount to port site 1 hour prior to chemo. Do not rub in. Cover with plastic wrap. 30 g 3  . propranolol (INDERAL) 20 MG tablet 1 po bid. Tome una tableta por boca dos veces diarias 60 tablet 3   Family History  Problem  Relation Age of Onset  . Diabetes Mother   . Uterine cancer Paternal Aunt 38  . Heart attack Paternal Uncle     unspecified age  . Heart attack Paternal Grandfather 75  . Other Paternal Uncle     stomach swollen with fluid that had to be removed; lim info   Social History  Substance Use Topics  . Smoking status: Passive Smoke Exposure - Never Smoker  . Smokeless tobacco: Never Used  . Alcohol use No   Review of Systems PER HPI OTHERWISE ALL SYSTEMS ARE NEGATIVE.    Objective:   Physical Exam  Constitutional: She is oriented to person, place, and time. She appears well-developed and well-nourished. No distress.  HENT:  Head: Normocephalic and atraumatic.  Mouth/Throat: Oropharynx is clear and moist. No oropharyngeal exudate.  Eyes: Pupils are equal, round, and reactive to light. No scleral icterus.  Neck: Normal range of motion. Neck supple.  Cardiovascular: Normal rate, regular rhythm and normal heart sounds.   Pulmonary/Chest: Effort normal and breath sounds normal. No respiratory distress.  Abdominal: Soft. Bowel sounds are normal. She exhibits no distension. There is tenderness. There is no rebound and no guarding.  MILD BLQs   Musculoskeletal: She exhibits no edema.  Lymphadenopathy:    She has no cervical adenopathy.  Neurological: She is alert and oriented to person, place, and time.  NO FOCAL DEFICITS  Psychiatric: She has a normal mood and affect.  Vitals reviewed.     Assessment & Plan:

## 2016-09-28 NOTE — Progress Notes (Signed)
ON RECALL  °

## 2016-09-28 NOTE — Assessment & Plan Note (Signed)
Most likely DUE TO 20 LBS WEIGHT GAIN AND NASH, LESS LIKELY HEPATITIS B, AUTOIMMUNE HEPATITIS, METASTATIC LIVER DISEASE(BREAST), MEDS, OR TB.  COMPLETE LABS LOSE 10 POUNDS COMPLETE CT SCAN WILL CALL HER WITH RESULTS FOLLOW UP IN 3 MOS.

## 2016-09-30 ENCOUNTER — Other Ambulatory Visit (HOSPITAL_COMMUNITY)
Admission: RE | Admit: 2016-09-30 | Discharge: 2016-09-30 | Disposition: A | Discharge status: Home / Self Care | Payer: Self-pay | Source: Ambulatory Visit | Attending: Gastroenterology | Admitting: Gastroenterology

## 2016-09-30 DIAGNOSIS — R740 Nonspecific elevation of levels of transaminase and lactic acid dehydrogenase [LDH]: Secondary | ICD-10-CM | POA: Insufficient documentation

## 2016-09-30 LAB — HEPATIC FUNCTION PANEL
ALT: 348 U/L — ABNORMAL HIGH (ref 14–54)
AST: 225 U/L — ABNORMAL HIGH (ref 15–41)
Albumin: 4.5 g/dL (ref 3.5–5.0)
Alkaline Phosphatase: 128 U/L — ABNORMAL HIGH (ref 38–126)
Bilirubin, Direct: 0.1 mg/dL (ref 0.1–0.5)
Indirect Bilirubin: 0.6 mg/dL (ref 0.3–0.9)
Total Bilirubin: 0.7 mg/dL (ref 0.3–1.2)
Total Protein: 7.9 g/dL (ref 6.5–8.1)

## 2016-10-01 LAB — IGA: IgA: 211 mg/dL (ref 87–352)

## 2016-10-01 LAB — IGM: IgM, Serum: 61 mg/dL (ref 26–217)

## 2016-10-01 LAB — IGG: IgG (Immunoglobin G), Serum: 1089 mg/dL (ref 700–1600)

## 2016-10-02 LAB — ANTI-SMOOTH MUSCLE ANTIBODY, IGG: F-Actin IgG: 24 Units — ABNORMAL HIGH (ref 0–19)

## 2016-10-03 LAB — ANTINUCLEAR ANTIBODIES, IFA: ANA Ab, IFA: NEGATIVE

## 2016-10-04 ENCOUNTER — Encounter (HOSPITAL_COMMUNITY): Payer: Self-pay

## 2016-10-05 ENCOUNTER — Ambulatory Visit (HOSPITAL_COMMUNITY)
Admission: RE | Admit: 2016-10-05 | Discharge: 2016-10-05 | Disposition: A | Discharge status: Home / Self Care | Payer: Self-pay | Source: Ambulatory Visit | Attending: Gastroenterology | Admitting: Gastroenterology

## 2016-10-05 DIAGNOSIS — K760 Fatty (change of) liver, not elsewhere classified: Secondary | ICD-10-CM | POA: Insufficient documentation

## 2016-10-05 DIAGNOSIS — R748 Abnormal levels of other serum enzymes: Secondary | ICD-10-CM | POA: Insufficient documentation

## 2016-10-05 DIAGNOSIS — Q433 Congenital malformations of intestinal fixation: Secondary | ICD-10-CM | POA: Insufficient documentation

## 2016-10-05 MED ORDER — IOPAMIDOL (ISOVUE-300) INJECTION 61%
100.0000 mL | Freq: Once | INTRAVENOUS | Status: AC | PRN
  Administered 2016-10-05: 100 mL via INTRAVENOUS

## 2016-10-10 ENCOUNTER — Other Ambulatory Visit (HOSPITAL_COMMUNITY): Payer: Self-pay | Admitting: *Deleted

## 2016-10-10 DIAGNOSIS — Z1231 Encounter for screening mammogram for malignant neoplasm of breast: Secondary | ICD-10-CM

## 2016-10-10 LAB — NGI HBV SUPERQUANT

## 2016-10-11 ENCOUNTER — Telehealth: Payer: Self-pay | Admitting: Gastroenterology

## 2016-10-11 NOTE — Telephone Encounter (Signed)
PLEASE CALL PT. HER LABS AND CT DID NOT REVEAL A REASON FOR HER ELEVATED LIVER ENZYMES. SHE SHOULD HAVE A ULTRASOUND GUIDED LIVER BIOPSY WITHIN THE NEXT 7-10 DAYS, DX: TRANSAMINITIS.

## 2016-10-12 ENCOUNTER — Ambulatory Visit (HOSPITAL_COMMUNITY): Payer: Self-pay

## 2016-10-12 ENCOUNTER — Other Ambulatory Visit: Payer: Self-pay

## 2016-10-12 ENCOUNTER — Telehealth: Payer: Self-pay | Admitting: Gastroenterology

## 2016-10-12 ENCOUNTER — Encounter (HOSPITAL_COMMUNITY): Payer: Self-pay

## 2016-10-12 ENCOUNTER — Ambulatory Visit (HOSPITAL_COMMUNITY)
Admission: RE | Admit: 2016-10-12 | Discharge: 2016-10-12 | Disposition: A | Discharge status: Home / Self Care | Payer: Self-pay | Source: Ambulatory Visit | Attending: *Deleted | Admitting: *Deleted

## 2016-10-12 DIAGNOSIS — R7401 Elevation of levels of liver transaminase levels: Secondary | ICD-10-CM

## 2016-10-12 DIAGNOSIS — Z1231 Encounter for screening mammogram for malignant neoplasm of breast: Secondary | ICD-10-CM

## 2016-10-12 DIAGNOSIS — R740 Nonspecific elevation of levels of transaminase and lactic acid dehydrogenase [LDH]: Principal | ICD-10-CM

## 2016-10-12 NOTE — Telephone Encounter (Signed)
Liver biopsy order has been entered in Epic.

## 2016-10-12 NOTE — Telephone Encounter (Signed)
Order entered for US liver biopsy in Epic.

## 2016-10-12 NOTE — Telephone Encounter (Signed)
Order has been entered for US biopsy of liver. Someone should be contacting her.

## 2016-10-12 NOTE — Telephone Encounter (Signed)
Notes Recorded by Danie Binder, MD on 10/11/2016 at 6:04 PM EST I [place] At: [place] DNA negative. ASMA WEAKLY POSITIVE. NEEDS LIVER BIOPSY.

## 2016-10-12 NOTE — Telephone Encounter (Signed)
PT is aware and OK to schedule.

## 2016-10-13 NOTE — Telephone Encounter (Signed)
Tried to call pt again, no answer.

## 2016-10-13 NOTE — Telephone Encounter (Signed)
Called [employer]. US biopsy liver scheduled for 10/20/16 at 1:00pm at [place] to call pt, LMOVM and asked her to call office to be informed of biopsy being scheduled.

## 2016-10-17 NOTE — Telephone Encounter (Signed)
Husband called back and he is aware of appointment

## 2016-10-17 NOTE — Telephone Encounter (Signed)
Tried to call pt to be informed of liver biopsy scheduled. LMOVM for pt to call office.

## 2016-10-17 NOTE — Telephone Encounter (Signed)
Pt's husband called back and said that she could not do that appointment. Her new appointment is 10/24/16 @ 1:00. She will need to be there at 11:30 am. He is aware of new time and date

## 2016-10-20 ENCOUNTER — Ambulatory Visit (HOSPITAL_COMMUNITY): Payer: Self-pay

## 2016-10-20 ENCOUNTER — Other Ambulatory Visit: Payer: Self-pay | Admitting: Radiology

## 2016-10-21 ENCOUNTER — Other Ambulatory Visit: Payer: Self-pay | Admitting: Radiology

## 2016-10-24 ENCOUNTER — Ambulatory Visit (HOSPITAL_COMMUNITY)
Admission: RE | Admit: 2016-10-24 | Discharge: 2016-10-24 | Disposition: A | Discharge status: Home / Self Care | Payer: Self-pay | Source: Ambulatory Visit | Attending: Gastroenterology | Admitting: Gastroenterology

## 2016-10-24 ENCOUNTER — Encounter (HOSPITAL_COMMUNITY): Payer: Self-pay

## 2016-10-24 DIAGNOSIS — R740 Nonspecific elevation of levels of transaminase and lactic acid dehydrogenase [LDH]: Secondary | ICD-10-CM | POA: Insufficient documentation

## 2016-10-24 DIAGNOSIS — Z8249 Family history of ischemic heart disease and other diseases of the circulatory system: Secondary | ICD-10-CM | POA: Insufficient documentation

## 2016-10-24 DIAGNOSIS — Z9889 Other specified postprocedural states: Secondary | ICD-10-CM | POA: Insufficient documentation

## 2016-10-24 DIAGNOSIS — Z7722 Contact with and (suspected) exposure to environmental tobacco smoke (acute) (chronic): Secondary | ICD-10-CM | POA: Insufficient documentation

## 2016-10-24 DIAGNOSIS — Z9012 Acquired absence of left breast and nipple: Secondary | ICD-10-CM | POA: Insufficient documentation

## 2016-10-24 DIAGNOSIS — Z833 Family history of diabetes mellitus: Secondary | ICD-10-CM | POA: Insufficient documentation

## 2016-10-24 DIAGNOSIS — R7401 Elevation of levels of liver transaminase levels: Secondary | ICD-10-CM

## 2016-10-24 DIAGNOSIS — Z8049 Family history of malignant neoplasm of other genital organs: Secondary | ICD-10-CM | POA: Insufficient documentation

## 2016-10-24 DIAGNOSIS — R7989 Other specified abnormal findings of blood chemistry: Secondary | ICD-10-CM | POA: Insufficient documentation

## 2016-10-24 DIAGNOSIS — K7581 Nonalcoholic steatohepatitis (NASH): Secondary | ICD-10-CM | POA: Insufficient documentation

## 2016-10-24 DIAGNOSIS — Z79899 Other long term (current) drug therapy: Secondary | ICD-10-CM | POA: Insufficient documentation

## 2016-10-24 DIAGNOSIS — Z853 Personal history of malignant neoplasm of breast: Secondary | ICD-10-CM | POA: Insufficient documentation

## 2016-10-24 DIAGNOSIS — K219 Gastro-esophageal reflux disease without esophagitis: Secondary | ICD-10-CM | POA: Insufficient documentation

## 2016-10-24 LAB — CBC
HCT: 37.7 % (ref 36.0–46.0)
Hemoglobin: 12.3 g/dL (ref 12.0–15.0)
MCH: 25.1 pg — ABNORMAL LOW (ref 26.0–34.0)
MCHC: 32.6 g/dL (ref 30.0–36.0)
MCV: 76.9 fL — ABNORMAL LOW (ref 78.0–100.0)
Platelets: 292 10*3/uL (ref 150–400)
RBC: 4.9 MIL/uL (ref 3.87–5.11)
RDW: 16.2 % — ABNORMAL HIGH (ref 11.5–15.5)
WBC: 5.9 10*3/uL (ref 4.0–10.5)

## 2016-10-24 LAB — PROTIME-INR
INR: 0.96
Prothrombin Time: 12.8 seconds (ref 11.4–15.2)

## 2016-10-24 LAB — HCG, SERUM, QUALITATIVE: Preg, Serum: NEGATIVE

## 2016-10-24 LAB — APTT: aPTT: 29 seconds (ref 24–36)

## 2016-10-24 MED ORDER — FENTANYL CITRATE (PF) 100 MCG/2ML IJ SOLN
INTRAMUSCULAR | Status: AC | PRN
  Administered 2016-10-24: 50 ug via INTRAVENOUS
  Administered 2016-10-24: 25 ug via INTRAVENOUS

## 2016-10-24 MED ORDER — FENTANYL CITRATE (PF) 100 MCG/2ML IJ SOLN
INTRAMUSCULAR | Status: AC
  Filled 2016-10-24: qty 2

## 2016-10-24 MED ORDER — HYDROCODONE-ACETAMINOPHEN 5-325 MG PO TABS: 1.0000 | ORAL_TABLET | ORAL | Status: DC | PRN

## 2016-10-24 MED ORDER — MIDAZOLAM HCL 2 MG/2ML IJ SOLN
INTRAMUSCULAR | Status: AC | PRN
  Administered 2016-10-24: 0.5 mg via INTRAVENOUS
  Administered 2016-10-24: 1 mg via INTRAVENOUS

## 2016-10-24 MED ORDER — LIDOCAINE HCL 1 % IJ SOLN
INTRAMUSCULAR | Status: AC
  Filled 2016-10-24: qty 20

## 2016-10-24 MED ORDER — GELATIN ABSORBABLE 12-7 MM EX MISC
CUTANEOUS | Status: AC
  Filled 2016-10-24: qty 1

## 2016-10-24 MED ORDER — MIDAZOLAM HCL 2 MG/2ML IJ SOLN
INTRAMUSCULAR | Status: AC
  Filled 2016-10-24: qty 2

## 2016-10-24 MED ORDER — SODIUM CHLORIDE 0.9 % IV SOLN
INTRAVENOUS | Status: AC | PRN
  Administered 2016-10-24: 10 mL/h via INTRAVENOUS

## 2016-10-24 MED ORDER — SODIUM CHLORIDE 0.9 % IV SOLN: INTRAVENOUS | Status: DC

## 2016-10-24 NOTE — H&P (Signed)
Chief Complaint: Patient was seen in consultation today for liver core biopsy at the request of Fields,Sandi L  Referring Physician(s): Danie Binder  Supervising Physician: Aletta Edouard  Patient Status: [facility] - Out-pt  History of Present Illness: Michele Salas is a 36 y.o. female   Hx Breast Ca Labs were showing rise in Liver functions Referred to Dr Artis Flock "fatty liver" Now scheduled for liver core biopsy   Past Medical History:  Diagnosis Date  . Breast cancer, left ([place]) 10/22/2015  . GERD (gastroesophageal reflux disease)   . Iron deficiency anemia 10/27/2015    Past Surgical History:  Procedure Laterality Date  . MASTECTOMY MODIFIED RADICAL Left 12/09/2015   Procedure: LEFT MODIFIED RADICAL MASTECTOMY;  Surgeon: Aviva Signs, MD;  Location: [facility];  Service: General;  Laterality: Left;  . PORTACATH PLACEMENT Right 12/09/2015   Procedure: INSERTION PORT-A-CATH RIGHT SUBCLAVIAN;  Surgeon: Aviva Signs, MD;  Location: [facility];  Service: General;  Laterality: Right;    Allergies: Patient has no known allergies.  Medications: Prior to Admission medications   Medication Sig Start Date End Date Taking? Authorizing Provider  lidocaine-prilocaine (EMLA) cream Apply a quarter size amount to port site 1 hour prior to chemo. Do not rub in. Cover with plastic wrap. 04/18/16  Yes Baird [facility], PA-C  propranolol (INDERAL) 20 MG tablet 1 po bid. Tome una tableta por boca dos veces diarias Patient not taking: Reported on 09/28/2016 08/26/15   [name], PA-C     Family History  Problem Relation Age of Onset  . Diabetes Mother   . Uterine cancer Paternal Aunt 47  . Heart attack Paternal Uncle     unspecified age  . Heart attack Paternal Grandfather 82  . Other Paternal Uncle     stomach swollen with fluid that had to be removed; lim info    Social History   Social History  . Marital status: Married    Spouse name: N/A  . Number of children: N/A  .  Years of education: N/A   Social History Main Topics  . Smoking status: Passive Smoke Exposure - Never Smoker  . Smokeless tobacco: Never Used  . Alcohol use No  . Drug use: No  . Sexual activity: Yes    Birth control/ protection: None   Other Topics Concern  . None   Social History Narrative  . None    Review of Systems: A 12 point ROS discussed and pertinent positives are indicated in the HPI above.  All other systems are negative.  Review of Systems  Constitutional: Negative for activity change and fatigue.  Respiratory: Negative for shortness of breath.   Cardiovascular: Negative for chest pain.  Gastrointestinal: Negative for abdominal pain.  Neurological: Negative for weakness.  Psychiatric/Behavioral: Negative for behavioral problems and confusion.    Vital Signs: BP (!) 127/92   Pulse 77   Temp 97.9 F (36.6 C)   Resp 16   SpO2 100%   Physical Exam  Constitutional: She is oriented to person, place, and time. She appears well-nourished.  Cardiovascular: Normal rate and regular rhythm.   Pulmonary/Chest: Effort normal and breath sounds normal. She has no wheezes.  Abdominal: Soft. Bowel sounds are normal. There is no tenderness.  Musculoskeletal: Normal range of motion.  Neurological: She is alert and oriented to person, place, and time.  Skin: Skin is warm and dry.  Psychiatric: She has a normal mood and affect. Her behavior is normal. Judgment and thought content normal.  Consented through Interpreter at bedside  Nursing note and vitals reviewed.   Mallampati Score:  MD Evaluation Airway: WNL Heart: WNL Abdomen: WNL Chest/ Lungs: WNL ASA  Classification: 2 Mallampati/Airway Score: One  Imaging: Ct Abdomen W Contrast  Result Date: 10/05/2016 CLINICAL DATA:  Elevated liver function tests. Left breast carcinoma. EXAM: CT ABDOMEN WITH CONTRAST TECHNIQUE: Multidetector CT imaging of the abdomen was performed using the standard protocol following bolus  administration of intravenous contrast. CONTRAST:  128m ISOVUE-300 IOPAMIDOL (ISOVUE-300) INJECTION 61% COMPARISON:  10/27/2015 FINDINGS: Lower chest: No acute findings. Hepatobiliary: No masses identified. Mild hepatic steatosis appears increased since prior study. Gallbladder is unremarkable. Pancreas:  No mass or inflammatory changes. Spleen:  Within normal limits in size and appearance. Adrenals/Urinary Tract: No masses identified. No evidence of hydronephrosis. 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 No evidence of small bowel volvulus or obstruction. Vascular/Lymphatic: No pathologically enlarged lymph nodes identified. No abdominal aortic aneurysm. Other:  None. Musculoskeletal:  No suspicious bone lesions identified. IMPRESSION: Mild hepatic steatosis. No evidence of hepatic neoplasm or biliary ductal dilatation. Congenital incomplete small bowel malrotation incidentally noted. Electronically Signed   By: JEarle GellM.D.   On: 10/05/2016 16:56   Ms Digital Screening Tomo Uni Right  Result Date: 10/13/2016 CLINICAL DATA:  Screening. EXAM: 2D DIGITAL SCREENING UNILATERAL RIGHT MAMMOGRAM WITH CAD AND ADJUNCT TOMO COMPARISON:  Previous exam(s). ACR Breast Density Category c: The breast tissue is heterogeneously dense, which may obscure small masses. FINDINGS: There are no findings suspicious for malignancy. Images were processed with CAD. IMPRESSION: No mammographic evidence of malignancy. A result letter of this screening mammogram will be mailed directly to the patient. RECOMMENDATION: Screening mammogram at age 36 (Code:SM-B-40A) BI-RADS CATEGORY  1: Negative. Electronically Signed   By: DLajean ManesM.D.   On: 10/13/2016 10:13    Labs:  CBC:  Recent Labs  05/12/16 1137 08/19/16 1048 08/23/16 0927 10/24/16 1151  WBC 5.1 4.6 5.7 5.9  HGB 11.1* 12.5 12.4 12.3  HCT 33.5* 38.2 37.6 37.7  PLT  374 321 314 292    COAGS:  Recent Labs  10/24/16 1151  INR 0.96  APTT 29    BMP:  Recent Labs  05/05/16 0902 05/12/16 1137 08/19/16 1048 08/23/16 0927  NA 138 138 137 138  K 3.6 3.6 3.7 3.8  CL 106 107 102 106  CO2 25 24 26 27   GLUCOSE 138* 129* 128* 101*  BUN 8 6 7 7   CALCIUM 9.2 9.3 9.7 9.4  CREATININE 0.43* 0.50 0.52 0.50  GFRNONAA >60 >60 >60 >60  GFRAA >60 >60 >60 >60    LIVER FUNCTION TESTS:  Recent Labs  05/12/16 1137 08/19/16 1048 08/23/16 0927 09/30/16 1530  BILITOT 0.5 0.8 0.5 0.7  AST 75* 199* 159* 225*  ALT 133* 323* 270* 348*  ALKPHOS 79 120 115 128*  PROT 6.9 7.7 7.5 7.9  ALBUMIN 4.1 4.3 4.1 4.5    TUMOR MARKERS: No results for input(s): AFPTM, CEA, CA199, CHROMGRNA in the last 8760 hours.  Assessment and Plan:  Increased liver functions "fatty liver" per Dr FOneida AlarNeed for random liver biopsy Scheduled now for today Risks and Benefits discussed with the patient including, but not limited to bleeding, infection, damage to adjacent structures or low yield requiring additional tests. All of the patient's questions were answered, patient is agreeable to proceed. Consent signed and in chart.  Thank you for this  interesting consult.  I greatly enjoyed meeting Michele Salas and look forward to participating in their care.  A copy of this report was sent to the requesting provider on this date.  Electronically Signed: Monia Sabal A 10/24/2016, 12:37 PM   I spent a total of  30 Minutes   in face to face in clinical consultation, greater than 50% of which was counseling/coordinating care for random liver core bx

## 2016-10-24 NOTE — Discharge Instructions (Addendum)
Biopsia de hgado, cuidados posteriores (Liver Biopsy, Care After) Estas indicaciones le proporcionan informacin general acerca de cmo deber cuidarse despus del procedimiento. El mdico tambin podr darle instrucciones especficas. Comunquese con el mdico si tiene algn problema o tiene preguntas despus del procedimiento. CUIDADOS EN EL HOGAR  Haga reposo en su casa durante 1 o 2 das, o segn le indique el mdico.  Pdale a alguien que se quede con usted durante al menos 24 horas.  Durante las primeras 24 horas,evite:  Conducir vehculos.  Delene Loll.  Cuidar a Producer, television/film/video.  Firmar documentos legales.  Tomar una ducha o un bao de inmersin.  Existen muchas maneras distintas de cerrar y cubrir un corte (incisin). Por ejemplo, un corte se puede cerrar con puntos, pegamento para la piel o tiras [id number]. Siga las indicaciones del mdico respecto a lo siguiente:  Advertising account executive.  Cambiar y Press photographer el vendaje.  Quitar lo que sea que se haya usado para Scientific laboratory technician.  No beba alcohol durante la primera semana.  No levante objetos que pesen ms de 5 libras (2,3 kg) ni practique deportes de contacto durante las primeras 2 semanas.  Tome los medicamentos solamente como se lo haya indicado el mdico. Durante la primera [employer], no tome medicamentos que contengan aspirina o medicamentos como ibuprofeno.  Retire los [employer] de la prueba. SOLICITE AYUDA SI:  El corte sangra o deja ms que solo una pequea mancha de [place].  El corte se pone rojo, se hincha (se inflama) o duele ms que antes.  Observa lquido o alguna otra cosa que sale del corte.  El corte huele mal.  Siente escalofros o fiebre. SOLICITE AYUDA DE INMEDIATO SI:  Tiene hinchazn, meteorismo o dolor en el vientre (abdomen).  Se marea o se desmaya.  Tiene una erupcin cutnea.  Tiene malestar estomacal (nuseas) o vomita.  Tiene dificultad para respirar, siente que le falta el aire  o siente que va a [place].  Le duele el pecho.  Tiene problemas visuales o para hablar.  Tiene problemas de equilibrio, o dificultad para mover los brazos o las piernas. Esta informacin no tiene Marine scientist el consejo del mdico. Asegrese de hacerle al mdico cualquier pregunta que tenga. Document Released: 10/21/2008 Document Revised: 08/15/2014 Document Reviewed: 09/20/2013 Elsevier Interactive Patient Education  2017 [place] consciente moderada en los adultos, cuidados posteriores (Moderate Conscious Sedation, Adult, Care After) Estas indicaciones le proporcionan informacin acerca de cmo deber cuidarse despus del procedimiento. El mdico tambin podr darle instrucciones ms especficas. El tratamiento ha sido planificado segn las prcticas mdicas actuales, pero en algunos casos pueden ocurrir problemas. Comunquese con el mdico si tiene algn problema o dudas despus del procedimiento. QU ESPERAR DESPUS DEL PROCEDIMIENTO Despus del procedimiento, es comn:  Sentirse somnoliento durante varias horas.  Sentirse torpe y [employer] de equilibrio durante varias horas.  Perder el sentido de la realidad durante varias horas.  Vomitar si come [employer]. INSTRUCCIONES PARA EL CUIDADO EN EL HOGAR Durante al menos 24horas despus del procedimiento:  No haga lo siguiente:  Participar en actividades que impliquen posibles cadas o lesiones.  Conducir vehculos.  Operar maquinarias pesadas.  Beber alcohol.  Tomar somnferos o medicamentos que causen somnolencia.  Firmar documentos legales ni tomar [employer].  Cuidar a nios por su cuenta.  Hacer reposo. Comida y bebida  Siga la dieta recomendada por el mdico.  Si vomita:  Pruebe agua, jugo o sopa cuando usted pueda beber sin vomitar.  Asegrese  de no tener nuseas antes de ingerir alimentos slidos. Instrucciones generales  Permanezca con un adulto responsable  hasta que est completamente despierto y consciente.  Tome los medicamentos de venta libre y los recetados solamente como se lo haya indicado el mdico.  Si fuma, no lo haga sin supervisin.  Concurra a todas las visitas de control como se lo haya indicado el mdico. Esto es importante. SOLICITE ATENCIN MDICA SI:  Sigue teniendo nuseas o vomitando.  Tiene sensacin de desvanecimiento.  Le aparece una erupcin cutnea.  Tiene fiebre. SOLICITE ATENCIN MDICA DE INMEDIATO SI:  Tiene dificultad para respirar. Esta informacin no tiene Marine scientist el consejo del mdico. Asegrese de hacerle al mdico cualquier pregunta que tenga. Document Released: 07/30/2013 Document Revised: 08/15/2014 Document Reviewed: 11/14/2015 Elsevier Interactive Patient Education  2017 [place].      Liver Biopsy, Care After These instructions give you information on caring for yourself after your procedure. Your doctor may also give you more specific instructions. Call your doctor if you have any problems or questions after your procedure. Follow these instructions at home:  Rest at home for 1-2 days or as told by your doctor.  Have someone stay with you for at least 24 hours.  Do not do these things in the first 24 hours:  Drive.  Use machinery.  Take care of other people.  Sign legal documents.  Take a bath or shower.  There are many different ways to close and cover a cut (incision). For example, a cut can be closed with stitches, skin glue, or adhesive strips. Follow your doctor's instructions on:  Taking care of your cut.  Changing and removing your bandage (dressing).  Removing whatever was used to close your cut.  Do not drink alcohol in the first week.  Do not lift more than 5 pounds or play contact sports for the first 2 weeks.  Take medicines only as told by your doctor. For 1 week, do not take medicine that has aspirin in it or medicines like  ibuprofen.  Get your test results. Contact a doctor if:  A cut bleeds and leaves more than just a small spot of blood.  A cut is red, puffs up (swells), or hurts more than before.  Fluid or something else comes from a cut.  A cut smells bad.  You have a fever or chills. Get help right away if:  You have swelling, bloating, or pain in your belly (abdomen).  You get dizzy or faint.  You have a rash.  You feel sick to your stomach (nauseous) or throw up (vomit).  You have trouble breathing, feel short of breath, or feel faint.  Your chest hurts.  You have problems talking or seeing.  You have trouble balancing or moving your arms or legs. This information is not intended to replace advice given to you by your health care provider. Make sure you discuss any questions you have with your health care provider. Document Released: 05/03/2008 Document Revised: 12/31/2015 Document Reviewed: 09/20/2013 Elsevier Interactive Patient Education  2017 [employer].

## 2016-10-24 NOTE — Procedures (Signed)
Interventional Radiology Procedure Note  Procedure: US guided random liver core biopsy  Complications: None  Estimated Blood Loss: < 10 mL  18 G core biopsy x 3 of right lobe parenchyma via 17 G needle. Gelfoam tract embo.  Venetia Night. Kathlene Cote, M.D Pager:  [phone]

## 2016-11-01 ENCOUNTER — Telehealth: Payer: Self-pay

## 2016-11-01 NOTE — Telephone Encounter (Signed)
Pt is wanting to know if the results are back for the liver bx. Please advise

## 2016-11-02 NOTE — Telephone Encounter (Signed)
PLEASE CALL PT. SHE HAS STEATOHEPATITIS, WHICH MEANS SHE HAS FAT IN HER LIVER AND IT'S IRRITATING IT. SHE NEEDS TO LOSE 10-20 LBS AND IT CAN GET BETTER. IF HER LIVER ENZYMES REMAIN ELEVATED SHE CAN DEVELOP CIRRHSOSIS(SCARRINGIN HER LIVER) AS IF SHE CONSUMED A BUNCH OF ALCOHOL.

## 2016-11-03 NOTE — Telephone Encounter (Signed)
LMOM to call.

## 2016-11-07 NOTE — Telephone Encounter (Signed)
LMOM to call and mailed a letter to call also.

## 2016-11-09 ENCOUNTER — Encounter: Payer: Self-pay | Admitting: Gastroenterology

## 2016-11-21 ENCOUNTER — Encounter (HOSPITAL_COMMUNITY): Payer: Self-pay | Attending: Oncology

## 2016-11-21 ENCOUNTER — Encounter (HOSPITAL_COMMUNITY): Payer: Self-pay | Attending: Oncology | Admitting: Oncology

## 2016-11-21 ENCOUNTER — Encounter (HOSPITAL_COMMUNITY): Payer: Self-pay

## 2016-11-21 VITALS — BP 120/81 | HR 76 | Temp 97.9°F | Resp 16 | Wt 156.3 lb

## 2016-11-21 DIAGNOSIS — C50912 Malignant neoplasm of unspecified site of left female breast: Secondary | ICD-10-CM | POA: Insufficient documentation

## 2016-11-21 DIAGNOSIS — C50112 Malignant neoplasm of central portion of left female breast: Secondary | ICD-10-CM

## 2016-11-21 DIAGNOSIS — D509 Iron deficiency anemia, unspecified: Secondary | ICD-10-CM

## 2016-11-21 DIAGNOSIS — R1013 Epigastric pain: Secondary | ICD-10-CM | POA: Insufficient documentation

## 2016-11-21 DIAGNOSIS — N644 Mastodynia: Secondary | ICD-10-CM | POA: Insufficient documentation

## 2016-11-21 DIAGNOSIS — R51 Headache: Secondary | ICD-10-CM | POA: Insufficient documentation

## 2016-11-21 DIAGNOSIS — Z79899 Other long term (current) drug therapy: Secondary | ICD-10-CM | POA: Insufficient documentation

## 2016-11-21 DIAGNOSIS — Z171 Estrogen receptor negative status [ER-]: Secondary | ICD-10-CM

## 2016-11-21 DIAGNOSIS — K219 Gastro-esophageal reflux disease without esophagitis: Secondary | ICD-10-CM | POA: Insufficient documentation

## 2016-11-21 DIAGNOSIS — C773 Secondary and unspecified malignant neoplasm of axilla and upper limb lymph nodes: Secondary | ICD-10-CM

## 2016-11-21 DIAGNOSIS — K760 Fatty (change of) liver, not elsewhere classified: Secondary | ICD-10-CM

## 2016-11-21 DIAGNOSIS — Z95828 Presence of other vascular implants and grafts: Secondary | ICD-10-CM

## 2016-11-21 LAB — CBC WITH DIFFERENTIAL/PLATELET
Basophils Absolute: 0 10*3/uL (ref 0.0–0.1)
Basophils Relative: 1 %
Eosinophils Absolute: 0.2 10*3/uL (ref 0.0–0.7)
Eosinophils Relative: 4 %
HCT: 36.3 % (ref 36.0–46.0)
Hemoglobin: 11.9 g/dL — ABNORMAL LOW (ref 12.0–15.0)
Lymphocytes Relative: 28 %
Lymphs Abs: 1.9 10*3/uL (ref 0.7–4.0)
MCH: 25.4 pg — ABNORMAL LOW (ref 26.0–34.0)
MCHC: 32.8 g/dL (ref 30.0–36.0)
MCV: 77.6 fL — ABNORMAL LOW (ref 78.0–100.0)
Monocytes Absolute: 0.5 10*3/uL (ref 0.1–1.0)
Monocytes Relative: 7 %
Neutro Abs: 4 10*3/uL (ref 1.7–7.7)
Neutrophils Relative %: 60 %
Platelets: 306 10*3/uL (ref 150–400)
RBC: 4.68 MIL/uL (ref 3.87–5.11)
RDW: 16.4 % — ABNORMAL HIGH (ref 11.5–15.5)
WBC: 6.6 10*3/uL (ref 4.0–10.5)

## 2016-11-21 LAB — COMPREHENSIVE METABOLIC PANEL
ALT: 198 U/L — ABNORMAL HIGH (ref 14–54)
AST: 117 U/L — ABNORMAL HIGH (ref 15–41)
Albumin: 3.9 g/dL (ref 3.5–5.0)
Alkaline Phosphatase: 129 U/L — ABNORMAL HIGH (ref 38–126)
Anion gap: 5 (ref 5–15)
BUN: 8 mg/dL (ref 6–20)
CO2: 25 mmol/L (ref 22–32)
Calcium: 9.1 mg/dL (ref 8.9–10.3)
Chloride: 108 mmol/L (ref 101–111)
Creatinine, Ser: 0.43 mg/dL — ABNORMAL LOW (ref 0.44–1.00)
GFR calc Af Amer: >60 mL/min (ref >60)
GFR calc non Af Amer: >60 mL/min (ref >60)
Glucose, Bld: 94 mg/dL (ref 65–99)
Potassium: 3.9 mmol/L (ref 3.5–5.1)
Sodium: 138 mmol/L (ref 135–145)
Total Bilirubin: 0.6 mg/dL (ref 0.3–1.2)
Total Protein: 7 g/dL (ref 6.5–8.1)

## 2016-11-21 MED ORDER — HEPARIN SOD (PORK) LOCK FLUSH 100 UNIT/ML IV SOLN
500.0000 [IU] | Freq: Once | INTRAVENOUS | Status: AC
  Administered 2016-11-21: 500 [IU] via INTRAVENOUS
  Filled 2016-11-21: qty 5

## 2016-11-21 MED ORDER — SODIUM CHLORIDE 0.9% FLUSH
10.0000 mL | Freq: Once | INTRAVENOUS | Status: AC
  Administered 2016-11-21: 10 mL via INTRAVENOUS

## 2016-11-21 NOTE — Patient Instructions (Addendum)
[place] at [facility] Discharge Instructions  RECOMMENDATIONS MADE BY THE CONSULTANT AND ANY TEST RESULTS WILL BE SENT TO YOUR REFERRING PHYSICIAN.  You were seen today by Dr. Twana First You will have labs drawn today, we will call you with the results Port flush every 6 weeks Follow up in 3 months See Amy up front for appointments   Thank you for choosing [place] at [facility] to provide your oncology and hematology care.  To afford each patient quality time with our provider, please arrive at least 15 minutes before your scheduled appointment time.    If you have a lab appointment with the [facility] please come in thru the  Main Entrance and check in at the main information desk  You need to re-schedule your appointment should you arrive 10 or more minutes late.  We strive to give you quality time with our providers, and arriving late affects you and other patients whose appointments are after yours.  Also, if you no show three or more times for appointments you may be dismissed from the clinic at the providers discretion.     Again, thank you for choosing [facility].  Our hope is that these requests will decrease the amount of time that you wait before being seen by our physicians.       _____________________________________________________________  Should you have questions after your visit to [facility], please contact our office at [phone] between the hours of 8:30 a.m. and 4:30 p.m.  Voicemails left after 4:30 p.m. will not be returned until the following business day.  For prescription refill requests, have your pharmacy contact our office.       Resources For Cancer Patients and their Caregivers ? American Cancer Society: Can assist with transportation, wigs, general needs, runs Look Good Feel Better.        780-655-2756 ? Cancer Care: Provides financial assistance, online support  groups, medication/co-pay assistance.  1-800-813-HOPE (513) 250-4920) ? [employer] Assists [place] cancer patients and their families through emotional , educational and financial support.  (570) 431-3218 ? [place] DSS Where to apply for food stamps, Medicaid and utility assistance. 762-335-5015 ? [facility]: Transportation to medical appointments. 929-393-0448 ? Social Security Administration: May apply for disability if have a Stage IV cancer. 307-717-0347 (910) 692-5858 ? [employer], Disability and Transit Services: Assists with nutrition, care and transit needs. [place] Support Programs: @10RELATIVEDAYS @ > Cancer Support Group  2nd Tuesday of the month 1pm-2pm, Journey Room  > Creative Journey  3rd Tuesday of the month 1130am-1pm, Journey Room  > Look Good Feel Better  1st Wednesday of the month 10am-12 noon, Journey Room (Call [place] to register [phone])

## 2016-11-21 NOTE — Progress Notes (Signed)
Michele Salas presented for Portacath access and flush. Proper placement of portacath confirmed by CXR. Portacath located right chest wall accessed with  H 20 needle. Good blood return present. Portacath flushed with 54m NS and 500U/521mHeparin and needle removed intact. Procedure without incident. Patient tolerated procedure well.

## 2016-11-21 NOTE — Progress Notes (Signed)
[facility] Hematology/Oncology Progress Note  Name: Michele Salas      MRN: [id number]    Date: 11/21/2016 Time:11:06 AM   REFERRING PHYSICIAN:  Aviva Signs, MD (Gen Surg)  REASON FOR CONSULT:  Left sided breast cancer   DIAGNOSIS:  Triple Negative Invasive ductal carcinoma, high grade, of left breast.    Cancer of central portion of left female breast ([place])   09/16/2015 Mammogram    1.6 cm retroareolar left breast mass measuring 1.6 cm in largest diameter in addition to 3 abnormal appearing left inferior axillary lymph nodes.      09/23/2015 Initial Biopsy    US guided core biopsy of left breast mass and suspicious left axillary lymph nodes.      09/23/2015 Pathology Results    Invasive ductal carcinoma, high grade.  Left axilla lymph node is positive for metastatic carcinoma.  ER/PR NEGATIVE, HER2 NEGATIVE, KI-67 marker of 85%.      10/26/2015 Imaging    Bone scan- No evidence of metastatic disease to the skeleton.      10/27/2015 Imaging    CT CAP-  Retroareolar mass with 2 asymmetrically prominent left axillary lymph nodes and an upper normal size left subpectoral lymph node. No other findings of metastatic disease in the chest, abdomen, or pelvis.      11/05/2015 Echocardiogram    The estimated ejection fraction was in the range of 60% to 65%.      11/23/2015 Miscellaneous    Genetic Counseling- genetic test result was negative for any known pathogenic mutations within any of 28 genes that would cause her to be at an increased genetic risk for breast, ovarian, or other related cancers.      12/09/2015 Surgery    L radical mastectomy, axillary LN dissection, port a cath insertion with Dr. Aviva Signs 12/09/2015      12/11/2015 Pathology Results    - INVASIVE GRADE III DUCTAL CARCINOMA, SPANNING 2.3 CM IN GREATEST DIMENSION. - ASSOCIATED HIGH GRADE DCIS - MARGINS ARE NEGATIVE. 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 - EIGHT  ADDITIONAL LYMPH NODES WITH NO TUMOR SEEN      12/11/2015 Pathology Results    HER2 NEGATIVE, ER 0%, PR 0%, Ki-67 marker 90%      12/25/2015 - 02/12/2016 Chemotherapy    AC every 2 weeks x 4 cycles      02/04/2016 Treatment Plan Change    Treatment deferred x 7 days due to neutropenia      02/25/2016 - 05/12/2016 Chemotherapy    Paclitaxel weekly x 12      06/01/2016 - 07/19/2016 Radiation Therapy    33 fractions, Dr. Lisbeth Renshaw in [place]      HISTORY OF PRESENT ILLNESS:  Michele Salas is a 36 y.o. Hispanic woman who presents today for further follow-up of triple negative carcinoma of the Left Breast. The patient completed radiation therapy on 07/19/16.  The patient is accompanied by a translator today. The patient had a recent liver biopsy on 10/24/16 for transaminitis and surgical path demonstrated fatty liver disease.  On review of systems, the patient reports small, white pieces in her throat. She denies any infection at this time. The patient is without complaint. Patient recently saw Dr. Iran Planas regarding breast reconstruction and has a follow up appointment on 12/05/16 to discuss scheduling her reconstruction. She has had a recent routine mammogram.    Review of Systems  Constitutional: Negative.   HENT:  Negative.   Eyes: Negative.   Respiratory: Negative.   Cardiovascular: Negative.   Gastrointestinal: Negative.   Genitourinary: Negative.   Musculoskeletal: Negative.   Skin: Negative.   Neurological: Negative.   Endo/Heme/Allergies: Negative.        Hot flashes  Psychiatric/Behavioral: Negative.   All other systems reviewed and are negative. 14 point review of systems was performed and is negative except as detailed under history of present illness and above  HORMONAL RISK FACTORS:  Menarche was at age 31.  First live birth at age 75.  OCP use for approximately not sure how long she took birth control for, but reports that she has not taken in 38 years.   Ovaries intact: yes.  Hysterectomy: no.  Menopausal status: premenopausal, but periods are irregular. HRT use: 0 years. Colonoscopy: n/a; not examined. Mammogram within the last year: this was her first mammogram. Number of breast biopsies: 1. Up to date with pelvic exams: yes. Any excessive radiation exposure in the past: no   PAST MEDICAL HISTORY:   Past Medical History:  Diagnosis Date  . Breast cancer, left ([place]) 10/22/2015  . GERD (gastroesophageal reflux disease)   . Iron deficiency anemia 10/27/2015    ALLERGIES: No Known Allergies    MEDICATIONS: I have reviewed the patient's current medications.    Current Outpatient Prescriptions on File Prior to Visit  Medication Sig Dispense Refill  . lidocaine-prilocaine (EMLA) cream Apply a quarter size amount to port site 1 hour prior to chemo. Do not rub in. Cover with plastic wrap. 30 g 3   No current facility-administered medications on file prior to visit.      PAST SURGICAL HISTORY Past Surgical History:  Procedure Laterality Date  . MASTECTOMY MODIFIED RADICAL Left 12/09/2015   Procedure: LEFT MODIFIED RADICAL MASTECTOMY;  Surgeon: Aviva Signs, MD;  Location: [facility];  Service: General;  Laterality: Left;  . PORTACATH PLACEMENT Right 12/09/2015   Procedure: INSERTION PORT-A-CATH RIGHT SUBCLAVIAN;  Surgeon: Aviva Signs, MD;  Location: [facility];  Service: General;  Laterality: Right;    FAMILY HISTORY: Family History  Problem Relation Age of Onset  . Diabetes Mother   . Uterine cancer Paternal Aunt 45  . Heart attack Paternal Uncle     unspecified age  . Heart attack Paternal Grandfather 62  . Other Paternal Uncle     stomach swollen with fluid that had to be removed; lim info  Mother is alive at the age of 36. She has hypertension and diabetes. She lives in [place]. Father is deceased at unknown age and for unknown reason. She reports "he was old." Brothers 24 years old and healthy. She has many half siblings,  many of whom live in [place]. She has 3 children. She is a 33 year old daughter who lives in [place], 76 year old son who lives here in [place], and 79 year old daughter who lives in [place]. All children are healthy.  SOCIAL HISTORY: Patient denies any tobacco abuse. Rarely does she have alcoholic beverage. She denies any illicit drug abuse. 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 She is religious and Catholic. She is not a documented citizen of the [place]. She is married.  PERFORMANCE STATUS: The patient's performance status is 0 - Asymptomatic  Vitals with BMI 11/21/2016  Height   Weight 156 lbs 5 oz  BMI   Systolic 025  Diastolic 81  Pulse 76  Respirations 16   Physical Exam  Constitutional: She is oriented to person,  place, and time. She appears well-developed and well-nourished.  HENT:  Head: Normocephalic and atraumatic.  Nose: Nose normal.  Mouth/Throat: Oropharynx is clear and moist.  Eyes: Conjunctivae and EOM are normal. Pupils are equal, round, and reactive to light.  Neck: Normal range of motion. Neck supple.  Cardiovascular: Normal rate, regular rhythm and normal heart sounds.  Exam reveals no gallop and no friction rub.   No murmur heard. Pulmonary/Chest: Effort normal and breath sounds normal. No respiratory distress. She has no wheezes. She has no rales. She exhibits no tenderness.  Abdominal: Soft. Bowel sounds are normal. There is no tenderness.  Musculoskeletal: Normal range of motion.  Neurological: She is alert and oriented to person, place, and time.  Skin: Skin is warm and dry.  Psychiatric: She has a normal mood and affect. Her behavior is normal. Judgment and thought content normal.    LABORATORY DATA:  I have reviewed the available the most recent lab data.  RADIOGRAPHY: I have personally reviewed the radiological images as listed and agreed with the findings in the report. Study Result     CLINICAL DATA: Left  breast cancer  EXAM: CHEST 2 VIEW  COMPARISON: CT 10/27/2015  FINDINGS: Calcified granuloma in the right upper lobe, stable. Heart is normal size. No confluent opacities or effusions. No acute bony abnormality.  IMPRESSION: No active cardiopulmonary disease.   Electronically Signed  By: Rolm Baptise M.D.  On: 12/04/2015 11:53    PATHOLOGY:      Pathology from [facility]:  Biopsy of left breast mass at the 12:00 position demonstrates invasive ductal carcinoma, high-grade.  Left axillary lymph node biopsy demonstrates invasive ductal carcinoma, high-grade, consistent with metastasis to lymph node. Breast prognostic panel is performed demonstrating ER/PR negativity, HER-2/neu negativity, and a KI-67 Marker of 85%.   ASSESSMENT/PLAN:  Iron deficiency anemia She was severely iron deficienct and iron stores were replaced in preparation for chemotherapy. Iron levels will be followed.  Cancer of central portion of left female breast ([place]) STAGE IIB triple negative carcinoma of L breast Treatment related nausea without vomiting Bandemia Transaminitis Hot Flashes  PLAN: - Reviewed liver biopsy results with the patient. I have recommended that she see a GI physician. Check CMP today to assess her transaminitis. - The patient should begin a low fat/cholesterol diet immediately.  - The patient will consult with surgeon on 12/05/16 per breast reconstruction surgery. - We will continue regular follow ups for close monitoring.  - RTC in 3 months with CBC and CMP. - Continue with port flush every 6 weeks. - follow up with Dr. Iran Planas regarding breast reconstruction.  All questions were answered. The patient knows to call the clinic with any problems, questions or concerns. We can certainly see the patient much sooner if necessary.  This document serves as a record of services personally performed by Twana First, MD. It was created on her behalf by Maryla Morrow, a trained medical scribe. The creation of this record is based on the scribe's personal observations and the provider's statements to them. This document has been checked and approved by the attending provider.  I have reviewed the above documentation for accuracy and completeness, and I agree with the above.   This note is electronically signed by:  Twana First, MD  11/21/2016 11:06 AM

## 2016-11-21 NOTE — Patient Instructions (Signed)
[place] at [facility] Discharge Instructions  RECOMMENDATIONS MADE BY THE CONSULTANT AND ANY TEST RESULTS WILL BE SENT TO YOUR REFERRING PHYSICIAN.  Port flush with lab work today as ordered. Return as scheduled.  Thank you for choosing [place] at [facility] to provide your oncology and hematology care.  To afford each patient quality time with our provider, please arrive at least 15 minutes before your scheduled appointment time.    If you have a lab appointment with the [facility] please come in thru the  Main Entrance and check in at the main information desk  You need to re-schedule your appointment should you arrive 10 or more minutes late.  We strive to give you quality time with our providers, and arriving late affects you and other patients whose appointments are after yours.  Also, if you no show three or more times for appointments you may be dismissed from the clinic at the providers discretion.     Again, thank you for choosing [facility].  Our hope is that these requests will decrease the amount of time that you wait before being seen by our physicians.       _____________________________________________________________  Should you have questions after your visit to [facility], please contact our office at [phone] between the hours of 8:30 a.m. and 4:30 p.m.  Voicemails left after 4:30 p.m. will not be returned until the following business day.  For prescription refill requests, have your pharmacy contact our office.       Resources For Cancer Patients and their Caregivers ? American Cancer Society: Can assist with transportation, wigs, general needs, runs Look Good Feel Better.        770-732-0151 ? Cancer Care: Provides financial assistance, online support groups, medication/co-pay assistance.  1-800-813-HOPE (339)541-2064) ? [place] Assists [place]  cancer patients and their families through emotional , educational and financial support.  618-424-4038 ? [place] DSS Where to apply for food stamps, Medicaid and utility assistance. 5202540430 ? RCATS: Transportation to medical appointments. 838-298-4609 ? Social Security Administration: May apply for disability if have a Stage IV cancer. 304-610-3176 (504)265-9366 ? [employer], Disability and Transit Services: Assists with nutrition, care and transit needs. [place] Support Programs: @10RELATIVEDAYS @ > Cancer Support Group  2nd Tuesday of the month 1pm-2pm, Journey Room  > Creative Journey  3rd Tuesday of the month 1130am-1pm, Journey Room  > Look Good Feel Better  1st Wednesday of the month 10am-12 noon, Journey Room (Call [place] to register [phone])

## 2017-01-11 ENCOUNTER — Encounter (HOSPITAL_COMMUNITY): Payer: Self-pay

## 2017-01-11 ENCOUNTER — Encounter (HOSPITAL_COMMUNITY): Payer: Self-pay | Attending: Oncology

## 2017-01-11 DIAGNOSIS — N644 Mastodynia: Secondary | ICD-10-CM | POA: Insufficient documentation

## 2017-01-11 DIAGNOSIS — Z79899 Other long term (current) drug therapy: Secondary | ICD-10-CM | POA: Insufficient documentation

## 2017-01-11 DIAGNOSIS — K219 Gastro-esophageal reflux disease without esophagitis: Secondary | ICD-10-CM | POA: Insufficient documentation

## 2017-01-11 DIAGNOSIS — R51 Headache: Secondary | ICD-10-CM | POA: Insufficient documentation

## 2017-01-11 DIAGNOSIS — C50912 Malignant neoplasm of unspecified site of left female breast: Secondary | ICD-10-CM | POA: Insufficient documentation

## 2017-01-11 DIAGNOSIS — R1013 Epigastric pain: Secondary | ICD-10-CM | POA: Insufficient documentation

## 2017-01-11 DIAGNOSIS — C50112 Malignant neoplasm of central portion of left female breast: Secondary | ICD-10-CM

## 2017-01-11 DIAGNOSIS — Z452 Encounter for adjustment and management of vascular access device: Secondary | ICD-10-CM

## 2017-01-11 MED ORDER — SODIUM CHLORIDE 0.9% FLUSH
10.0000 mL | INTRAVENOUS | Status: DC | PRN
  Administered 2017-01-11: 10 mL via INTRAVENOUS
  Filled 2017-01-11: qty 10

## 2017-01-11 MED ORDER — HEPARIN SOD (PORK) LOCK FLUSH 100 UNIT/ML IV SOLN
500.0000 [IU] | Freq: Once | INTRAVENOUS | Status: AC
  Administered 2017-01-11: 500 [IU] via INTRAVENOUS
  Filled 2017-01-11: qty 5

## 2017-01-11 NOTE — Patient Instructions (Signed)
[place] at [facility] Discharge Instructions  RECOMMENDATIONS MADE BY THE CONSULTANT AND ANY TEST RESULTS WILL BE SENT TO YOUR REFERRING PHYSICIAN.  Port flush today. Return as scheduled.   Thank you for choosing [place] at [facility] to provide your oncology and hematology care.  To afford each patient quality time with our provider, please arrive at least 15 minutes before your scheduled appointment time.    If you have a lab appointment with the [facility] please come in thru the  Main Entrance and check in at the main information desk  You need to re-schedule your appointment should you arrive 10 or more minutes late.  We strive to give you quality time with our providers, and arriving late affects you and other patients whose appointments are after yours.  Also, if you no show three or more times for appointments you may be dismissed from the clinic at the providers discretion.     Again, thank you for choosing [facility].  Our hope is that these requests will decrease the amount of time that you wait before being seen by our physicians.       _____________________________________________________________  Should you have questions after your visit to [facility], please contact our office at [phone] between the hours of 8:30 a.m. and 4:30 p.m.  Voicemails left after 4:30 p.m. will not be returned until the following business day.  For prescription refill requests, have your pharmacy contact our office.       Resources For Cancer Patients and their Caregivers ? American Cancer Society: Can assist with transportation, wigs, general needs, runs Look Good Feel Better.        (204) 852-2918 ? Cancer Care: Provides financial assistance, online support groups, medication/co-pay assistance.  1-800-813-HOPE 8436735812) ? [place] Assists [place] cancer patients and their  families through emotional , educational and financial support.  9566817770 ? [place] DSS Where to apply for food stamps, Medicaid and utility assistance. (630)887-7947 ? RC[employer]: Transportation to medical appointments. (774)461-4618 ? Social Security Administration: May apply for disability if have a Stage IV cancer. 332-384-7866 434-208-2315 ? [employer], Disability and Transit Services: Assists with nutrition, care and transit needs. [place] Support Programs: @10RELATIVEDAYS @ > Cancer Support Group  2nd Tuesday of the month 1pm-2pm, Journey Room  > Creative Journey  3rd Tuesday of the month 1130am-1pm, Journey Room  > Look Good Feel Better  1st Wednesday of the month 10am-12 noon, Journey Room (Call [place] to register [phone])

## 2017-01-11 NOTE — Progress Notes (Signed)
Michele Salas presented for Portacath access and flush.  Portacath located right chest wall accessed with  H 20 needle.  Good blood return present. Portacath flushed with 45m NS and 500U/576mHeparin and needle removed intact.  Procedure tolerated well and without incident.  Discharged ambulatory.

## 2017-01-25 NOTE — H&P (Signed)
Subjective:     Patient ID: Michele Salas is a 36 y.o. female.  HPI  Here for follow up breast reconstruction. 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 Completed adjuvant chemotherapy, and adjuvant radiation completed 12.12.17.  Since last visit, had benign liver biopsy for transaminitis.  Genetics negative. Screening MMG 3.2018 normal  Lives with husband and two sons, 55, 70.Reports size 34/36 B, desires larger.   Review of Systems    Objective:   Physical Exam  Cardiovascular: Normal rate, regular rhythm and normal heart sounds.   Pulmonary/Chest: Effort normal and breath sounds normal.  Skin:  Fitzpatrick 4   Left chest absent with hyperpigmentation radiation changes   SN to nipple R 23 BW R 14 left CW 12.5 Nipple to IMF R 6 cm on stretch  Assessment:     S/p left mastectomy, adjuvant radiation    Plan:      Recommend 6 months from end of radiation to starting reconstruction. Reviewed in setting radiation will have higher risks complications including wound healing problems, contracture. Discussed her overall goal to be slightly larger. This would require implant/surgery on right breast. In this setting, recommendLD flap with TE placement. Reviewed submuscular placement expander, process expansion, second stage surgery for implants. Without flap, expander alone post radiation may not expand well, high risk extrusion. Reviewed alternative abdominal flap. Reviewed OR length, hospital stay, drains.   Reviewed role of latissimus with expander, this will reduce her risks of reconstruction back to baseline but will involve donor site back.   Reviewed examples of expander and implants, portfolio pictures.  We discussed abdominal based reconstruction- this would be good option if she was satisfied with her opposite breast and did not desire change in volume. She  confirms she desires larger overall size.  Husband reports they do have financial agreement with [facility] and she is continuing to receive her oncology f/u at [facility]. There is no plan for port removal from review of notes.   Reviewed timing of future surgery- they have trip with church planned in October and second stage would likely be after this.    Irene Limbo, MD [facility] [phone], pin [phone]

## 2017-01-30 ENCOUNTER — Encounter (HOSPITAL_COMMUNITY)
Admission: RE | Admit: 2017-01-30 | Discharge: 2017-01-30 | Disposition: A | Discharge status: Home / Self Care | Payer: Self-pay | Source: Ambulatory Visit | Attending: Plastic Surgery | Admitting: Plastic Surgery

## 2017-01-30 ENCOUNTER — Encounter (HOSPITAL_COMMUNITY): Payer: Self-pay

## 2017-01-30 ENCOUNTER — Other Ambulatory Visit (HOSPITAL_COMMUNITY): Payer: Self-pay | Admitting: *Deleted

## 2017-01-30 DIAGNOSIS — Z01812 Encounter for preprocedural laboratory examination: Secondary | ICD-10-CM | POA: Insufficient documentation

## 2017-01-30 DIAGNOSIS — Z853 Personal history of malignant neoplasm of breast: Secondary | ICD-10-CM | POA: Insufficient documentation

## 2017-01-30 HISTORY — DX: Nausea with vomiting, unspecified: R11.2

## 2017-01-30 HISTORY — DX: Other specified postprocedural states: Z98.890

## 2017-01-30 LAB — COMPREHENSIVE METABOLIC PANEL
ALT: 87 U/L — ABNORMAL HIGH (ref 14–54)
AST: 55 U/L — ABNORMAL HIGH (ref 15–41)
Albumin: 4.1 g/dL (ref 3.5–5.0)
Alkaline Phosphatase: 131 U/L — ABNORMAL HIGH (ref 38–126)
Anion gap: 8 (ref 5–15)
BUN: 7 mg/dL (ref 6–20)
CO2: 23 mmol/L (ref 22–32)
Calcium: 9.4 mg/dL (ref 8.9–10.3)
Chloride: 108 mmol/L (ref 101–111)
Creatinine, Ser: 0.59 mg/dL (ref 0.44–1.00)
GFR calc Af Amer: >60 mL/min (ref >60)
GFR calc non Af Amer: >60 mL/min (ref >60)
Glucose, Bld: 122 mg/dL — ABNORMAL HIGH (ref 65–99)
Potassium: 3.6 mmol/L (ref 3.5–5.1)
Sodium: 139 mmol/L (ref 135–145)
Total Bilirubin: 0.7 mg/dL (ref 0.3–1.2)
Total Protein: 7.2 g/dL (ref 6.5–8.1)

## 2017-01-30 LAB — CBC
HCT: 35.6 % — ABNORMAL LOW (ref 36.0–46.0)
Hemoglobin: 11.2 g/dL — ABNORMAL LOW (ref 12.0–15.0)
MCH: 23.8 pg — ABNORMAL LOW (ref 26.0–34.0)
MCHC: 31.5 g/dL (ref 30.0–36.0)
MCV: 75.7 fL — ABNORMAL LOW (ref 78.0–100.0)
Platelets: 350 10*3/uL (ref 150–400)
RBC: 4.7 MIL/uL (ref 3.87–5.11)
RDW: 14.5 % (ref 11.5–15.5)
WBC: 7.1 10*3/uL (ref 4.0–10.5)

## 2017-01-30 LAB — HCG, SERUM, QUALITATIVE: Preg, Serum: NEGATIVE

## 2017-01-30 NOTE — Pre-Procedure Instructions (Signed)
Thresia Ramanathan  01/30/2017    Your procedure is scheduled on Monday, February 06, 2017 at 7:30 AM.   Report to [facility] Entrance "A" Admitting Office at 5:30 AM.   Call this number if you have problems the morning of surgery: [phone]   Questions prior to day of surgery, please call [phone] between 8 & 4 PM.   Remember:  Do not eat food or drink liquids after midnight Sunday, 02/05/17.  Do not use NSAIDS (Ibuprofen, Aleve, etc.) or Aspirin Products 5 days prior to surgery.  Drink "Boost" drink 2 hours prior to time of arrival day of surgery. Drink it at 3:30 AM on 02/06/17.    Do not wear jewelry, make-up or nail polish.  Do not wear lotions, powders, perfumes or deodorant.  Do not shave 48 hours prior to surgery.   Do not bring valuables to the hospital.  [facility] is not responsible for any belongings or valuables.  Contacts, dentures or bridgework may not be worn into surgery.  Leave your suitcase in the car.  After surgery it may be brought to your room.  For patients admitted to the hospital, discharge time will be determined by your treatment team.   [facility] - Preparing for Surgery  Before surgery, you can play an important role.  Because skin is not sterile, your skin needs to be as free of germs as possible.  You can reduce the number of germs on you skin by washing with CHG (chlorahexidine gluconate) soap before surgery.  CHG is an antiseptic cleaner which kills germs and bonds with the skin to continue killing germs even after washing.  Please DO NOT use if you have an allergy to CHG or antibacterial soaps.  If your skin becomes reddened/irritated stop using the CHG and inform your nurse when you arrive at Short Stay.  Do not shave (including legs and underarms) for at least 48 hours prior to the first CHG shower.  You may shave your face.  Please follow these instructions carefully:   1.  Shower with CHG Soap the night before surgery and the                     morning of Surgery.  2.  If you choose to wash your hair, wash your hair first as usual with your       normal shampoo.  3.  After you shampoo, rinse your hair and body thoroughly to remove the shampoo.  4.  Use CHG as you would any other liquid soap.  You can apply chg directly       to the skin and wash gently with scrungie or a clean washcloth.  5.  Apply the CHG Soap to your body ONLY FROM THE NECK DOWN.        Do not use on open wounds or open sores.  Avoid contact with your eyes, ears, mouth and genitals (private parts).  Wash genitals (private parts) with your normal soap.  6.  Wash thoroughly, paying special attention to the area where your surgery        will be performed.  7.  Thoroughly rinse your body with warm water from the neck down.  8.  DO NOT shower/wash with your normal soap after using and rinsing off       the CHG Soap.  9.  Pat yourself dry with a clean towel.  10.  Wear clean pajamas.            11.  Place clean sheets on your bed the night of your first shower and do not        sleep with pets.  Day of Surgery  Do not apply any lotions/deodorants the morning of surgery.  Please wear clean clothes to the hospital.   Please read over the fact sheets that you were given.

## 2017-01-30 NOTE — Progress Notes (Signed)
Pt speaks Spanish. Pt has friend with her to interpret. I offered to call for an interpreter, but pt wanted friend to do the interpreting. Consent signed by pt. Pt denies cardiac history, chest pain or sob. Pt denies being diabetic.  Have requested an interpreter for day of surgery.

## 2017-02-05 MED ORDER — HEPARIN SODIUM (PORCINE) 5000 UNIT/ML IJ SOLN
5000.0000 [IU] | INTRAMUSCULAR | Status: AC
  Administered 2017-02-06: 5000 [IU] via SUBCUTANEOUS
  Filled 2017-02-05 (×2): qty 1

## 2017-02-05 MED ORDER — CELECOXIB 200 MG PO CAPS
400.0000 mg | ORAL_CAPSULE | ORAL | Status: AC
  Administered 2017-02-06: 400 mg via ORAL
  Filled 2017-02-05: qty 2

## 2017-02-05 MED ORDER — GABAPENTIN 300 MG PO CAPS
300.0000 mg | ORAL_CAPSULE | ORAL | Status: AC
  Administered 2017-02-06: 300 mg via ORAL
  Filled 2017-02-05: qty 1

## 2017-02-05 NOTE — Anesthesia Preprocedure Evaluation (Addendum)
Anesthesia Evaluation  Patient identified by MRN, date of birth, ID band Patient awake    Reviewed: Allergy & Precautions, NPO status , Patient's Chart, lab work & pertinent test results  History of Anesthesia Complications (+) PONV and history of anesthetic complications  Airway Mallampati: II  TM Distance: >3 FB Neck ROM: Full    Dental no notable dental hx. (+) Dental Advisory Given   Pulmonary neg pulmonary ROS,    Pulmonary exam normal        Cardiovascular negative cardio ROS Normal cardiovascular exam  Study Conclusions  - Left ventricle: The cavity size was normal. Wall thickness was   normal. Systolic function was normal. The estimated ejection   fraction was in the range of 60% to 65%. Wall motion was normal;   there were no regional wall motion abnormalities.    Neuro/Psych  Headaches,    GI/Hepatic negative GI ROS, (+) Hepatitis -, Unspecified  Endo/Other  negative endocrine ROS  Renal/GU negative Renal ROS  negative genitourinary   Musculoskeletal negative musculoskeletal ROS (+)   Abdominal   Peds  Hematology negative hematology ROS (+)   Anesthesia Other Findings Day of surgery medications reviewed with the patient.  Reproductive/Obstetrics                            Anesthesia Physical Anesthesia Plan  ASA: III  Anesthesia Plan: General   Post-op Pain Management:    Induction: Intravenous  PONV Risk Score and Plan: 4 or greater and Ondansetron, Dexamethasone, Scopolamine patch - Pre-op and Diphenhydramine  Airway Management Planned: Oral ETT  Additional Equipment:   Intra-op Plan:   Post-operative Plan: Extubation in OR  Informed Consent: I have reviewed the patients History and Physical, chart, labs and discussed the procedure including the risks, benefits and alternatives for the proposed anesthesia with the patient or authorized representative who has  indicated his/her understanding and acceptance.   Dental advisory given  Plan Discussed with: CRNA and Anesthesiologist  Anesthesia Plan Comments: (Interpreter was used for the interview)      Anesthesia Quick Evaluation

## 2017-02-06 ENCOUNTER — Encounter (HOSPITAL_COMMUNITY)
Admission: RE | Disposition: A | Discharge status: Home / Self Care | Payer: Self-pay | Source: Ambulatory Visit | Attending: Plastic Surgery

## 2017-02-06 ENCOUNTER — Observation Stay (HOSPITAL_COMMUNITY)
Admission: RE | Admit: 2017-02-06 | Discharge: 2017-02-07 | Disposition: A | Discharge status: Home / Self Care | Payer: Self-pay | Source: Ambulatory Visit | Attending: Plastic Surgery | Admitting: Plastic Surgery

## 2017-02-06 ENCOUNTER — Inpatient Hospital Stay (HOSPITAL_COMMUNITY): Payer: Self-pay | Admitting: Anesthesiology

## 2017-02-06 ENCOUNTER — Encounter (HOSPITAL_COMMUNITY): Payer: Self-pay

## 2017-02-06 DIAGNOSIS — Z9010 Acquired absence of unspecified breast and nipple: Secondary | ICD-10-CM

## 2017-02-06 DIAGNOSIS — Z923 Personal history of irradiation: Secondary | ICD-10-CM | POA: Insufficient documentation

## 2017-02-06 DIAGNOSIS — Z853 Personal history of malignant neoplasm of breast: Secondary | ICD-10-CM | POA: Insufficient documentation

## 2017-02-06 DIAGNOSIS — Z421 Encounter for breast reconstruction following mastectomy: Principal | ICD-10-CM | POA: Insufficient documentation

## 2017-02-06 DIAGNOSIS — Z9221 Personal history of antineoplastic chemotherapy: Secondary | ICD-10-CM | POA: Insufficient documentation

## 2017-02-06 DIAGNOSIS — Z9012 Acquired absence of left breast and nipple: Secondary | ICD-10-CM | POA: Insufficient documentation

## 2017-02-06 HISTORY — PX: LATISSIMUS FLAP TO BREAST: SHX5357

## 2017-02-06 HISTORY — PX: TISSUE EXPANDER PLACEMENT: SHX2530

## 2017-02-06 SURGERY — RECONSTRUCTION, BREAST, USING LATISSIMUS DORSI MYOCUTANEOUS FLAP
Anesthesia: General | Site: Breast | Laterality: Left

## 2017-02-06 MED ORDER — HYDROMORPHONE HCL 1 MG/ML IJ SOLN
INTRAMUSCULAR | Status: AC
Start: 2017-02-06 — End: 2017-02-06
  Filled 2017-02-06: qty 0.5

## 2017-02-06 MED ORDER — ACETAMINOPHEN 500 MG PO TABS
1000.0000 mg | ORAL_TABLET | ORAL | Status: AC
  Administered 2017-02-06: 1000 mg via ORAL
  Filled 2017-02-06: qty 2

## 2017-02-06 MED ORDER — ROCURONIUM BROMIDE 10 MG/ML (PF) SYRINGE
PREFILLED_SYRINGE | INTRAVENOUS | Status: AC
  Filled 2017-02-06: qty 5

## 2017-02-06 MED ORDER — GENTAMICIN SULFATE 40 MG/ML IJ SOLN
Freq: Once | INTRAMUSCULAR | Status: AC
  Administered 2017-02-06: 08:00:00
  Filled 2017-02-06: qty 1

## 2017-02-06 MED ORDER — PROMETHAZINE HCL 25 MG/ML IJ SOLN: 6.2500 mg | INTRAMUSCULAR | Status: DC | PRN

## 2017-02-06 MED ORDER — HYDROMORPHONE HCL 1 MG/ML IJ SOLN
INTRAMUSCULAR | Status: AC
  Filled 2017-02-06: qty 0.5

## 2017-02-06 MED ORDER — ONDANSETRON HCL 4 MG/2ML IJ SOLN
4.0000 mg | Freq: Four times a day (QID) | INTRAMUSCULAR | Status: DC | PRN
  Administered 2017-02-06: 4 mg via INTRAVENOUS
  Filled 2017-02-06: qty 2

## 2017-02-06 MED ORDER — KCL IN DEXTROSE-NACL 20-5-0.45 MEQ/L-%-% IV SOLN
INTRAVENOUS | Status: DC
  Administered 2017-02-06: 15:00:00 via INTRAVENOUS
  Administered 2017-02-07: 75 mL/h via INTRAVENOUS
  Filled 2017-02-06 (×2): qty 1000

## 2017-02-06 MED ORDER — GLYCOPYRROLATE 0.2 MG/ML IV SOSY
PREFILLED_SYRINGE | INTRAVENOUS | Status: DC | PRN
Start: 2017-02-06 — End: 2017-02-06
  Administered 2017-02-06: .6 mg via INTRAVENOUS

## 2017-02-06 MED ORDER — DEXAMETHASONE SODIUM PHOSPHATE 10 MG/ML IJ SOLN
INTRAMUSCULAR | Status: DC | PRN
  Administered 2017-02-06: 10 mg via INTRAVENOUS

## 2017-02-06 MED ORDER — PHENYLEPHRINE 40 MCG/ML (10ML) SYRINGE FOR IV PUSH (FOR BLOOD PRESSURE SUPPORT)
PREFILLED_SYRINGE | INTRAVENOUS | Status: DC | PRN
  Administered 2017-02-06: 40 ug via INTRAVENOUS
  Administered 2017-02-06: 80 ug via INTRAVENOUS
  Administered 2017-02-06 (×2): 40 ug via INTRAVENOUS
  Administered 2017-02-06: 80 ug via INTRAVENOUS

## 2017-02-06 MED ORDER — KETOROLAC TROMETHAMINE 30 MG/ML IJ SOLN
30.0000 mg | Freq: Four times a day (QID) | INTRAMUSCULAR | Status: AC
  Administered 2017-02-06 (×3): 30 mg via INTRAVENOUS
  Filled 2017-02-06 (×3): qty 1

## 2017-02-06 MED ORDER — CHLORHEXIDINE GLUCONATE CLOTH 2 % EX PADS: 6.0000 | MEDICATED_PAD | Freq: Once | CUTANEOUS | Status: DC

## 2017-02-06 MED ORDER — DEXAMETHASONE SODIUM PHOSPHATE 10 MG/ML IJ SOLN
INTRAMUSCULAR | Status: AC
  Filled 2017-02-06: qty 1

## 2017-02-06 MED ORDER — PROPOFOL 10 MG/ML IV BOLUS
INTRAVENOUS | Status: DC | PRN
  Administered 2017-02-06: 150 mg via INTRAVENOUS
  Administered 2017-02-06: 50 mg via INTRAVENOUS

## 2017-02-06 MED ORDER — ONDANSETRON 4 MG PO TBDP: 4.0000 mg | ORAL_TABLET | Freq: Four times a day (QID) | ORAL | Status: DC | PRN

## 2017-02-06 MED ORDER — DIPHENHYDRAMINE HCL 25 MG PO CAPS: 25.0000 mg | ORAL_CAPSULE | Freq: Four times a day (QID) | ORAL | Status: DC | PRN

## 2017-02-06 MED ORDER — SODIUM CHLORIDE FLUSH 0.9 % IV SOLN
INTRAVENOUS | Status: AC
  Filled 2017-02-06: qty 10

## 2017-02-06 MED ORDER — OXYCODONE HCL 5 MG PO TABS: 5.0000 mg | ORAL_TABLET | ORAL | Status: DC | PRN

## 2017-02-06 MED ORDER — ONDANSETRON HCL 4 MG/2ML IJ SOLN
INTRAMUSCULAR | Status: AC
  Filled 2017-02-06: qty 2

## 2017-02-06 MED ORDER — CEFAZOLIN SODIUM-DEXTROSE 2-4 GM/100ML-% IV SOLN
2.0000 g | INTRAVENOUS | Status: AC
  Administered 2017-02-06: 2 g via INTRAVENOUS
  Filled 2017-02-06: qty 100

## 2017-02-06 MED ORDER — 0.9 % SODIUM CHLORIDE (POUR BTL) OPTIME
TOPICAL | Status: DC | PRN
  Administered 2017-02-06 (×2): 1000 mL

## 2017-02-06 MED ORDER — SCOPOLAMINE 1 MG/3DAYS TD PT72
1.0000 | MEDICATED_PATCH | TRANSDERMAL | Status: DC
  Administered 2017-02-06: 1.5 mg via TRANSDERMAL
  Filled 2017-02-06: qty 1

## 2017-02-06 MED ORDER — LIDOCAINE 2% (20 MG/ML) 5 ML SYRINGE
INTRAMUSCULAR | Status: AC
  Filled 2017-02-06: qty 5

## 2017-02-06 MED ORDER — DIPHENHYDRAMINE HCL 50 MG/ML IJ SOLN: 25.0000 mg | Freq: Four times a day (QID) | INTRAMUSCULAR | Status: DC | PRN

## 2017-02-06 MED ORDER — LIDOCAINE 2% (20 MG/ML) 5 ML SYRINGE
INTRAMUSCULAR | Status: DC | PRN
  Administered 2017-02-06: 100 mg via INTRAVENOUS

## 2017-02-06 MED ORDER — CEFAZOLIN SODIUM-DEXTROSE 1-4 GM/50ML-% IV SOLN
1.0000 g | Freq: Three times a day (TID) | INTRAVENOUS | Status: DC
  Administered 2017-02-06 – 2017-02-07 (×4): 1 g via INTRAVENOUS
  Filled 2017-02-06 (×6): qty 50

## 2017-02-06 MED ORDER — FENTANYL CITRATE (PF) 250 MCG/5ML IJ SOLN
INTRAMUSCULAR | Status: AC
  Filled 2017-02-06: qty 5

## 2017-02-06 MED ORDER — HYDROMORPHONE HCL 1 MG/ML IJ SOLN
0.2500 mg | INTRAMUSCULAR | Status: DC | PRN
  Administered 2017-02-06 (×2): 0.5 mg via INTRAVENOUS

## 2017-02-06 MED ORDER — ENOXAPARIN SODIUM 40 MG/0.4ML ~~LOC~~ SOLN
40.0000 mg | SUBCUTANEOUS | Status: DC
  Administered 2017-02-07: 40 mg via SUBCUTANEOUS
  Filled 2017-02-06: qty 0.4

## 2017-02-06 MED ORDER — HYDROMORPHONE HCL 1 MG/ML IJ SOLN: 0.5000 mg | INTRAMUSCULAR | Status: DC | PRN

## 2017-02-06 MED ORDER — PROMETHAZINE HCL 25 MG PO TABS
12.5000 mg | ORAL_TABLET | Freq: Four times a day (QID) | ORAL | Status: DC | PRN
Start: 2017-02-06 — End: 2017-02-07

## 2017-02-06 MED ORDER — FENTANYL CITRATE (PF) 100 MCG/2ML IJ SOLN
INTRAMUSCULAR | Status: DC | PRN
  Administered 2017-02-06 (×3): 50 ug via INTRAVENOUS
  Administered 2017-02-06: 100 ug via INTRAVENOUS
  Administered 2017-02-06: 50 ug via INTRAVENOUS

## 2017-02-06 MED ORDER — ONDANSETRON HCL 4 MG/2ML IJ SOLN
INTRAMUSCULAR | Status: DC | PRN
  Administered 2017-02-06: 4 mg via INTRAVENOUS

## 2017-02-06 MED ORDER — METHOCARBAMOL 500 MG PO TABS: 500.0000 mg | ORAL_TABLET | Freq: Three times a day (TID) | ORAL | Status: DC | PRN

## 2017-02-06 MED ORDER — BUPIVACAINE LIPOSOME 1.3 % IJ SUSP
20.0000 mL | INTRAMUSCULAR | Status: AC
  Administered 2017-02-06: 20 mL
  Filled 2017-02-06: qty 20

## 2017-02-06 MED ORDER — DIPHENHYDRAMINE HCL 50 MG/ML IJ SOLN
INTRAMUSCULAR | Status: DC | PRN
  Administered 2017-02-06: 12.5 mg via INTRAVENOUS

## 2017-02-06 MED ORDER — LACTATED RINGERS IV SOLN
INTRAVENOUS | Status: DC | PRN
  Administered 2017-02-06 (×2): via INTRAVENOUS

## 2017-02-06 MED ORDER — PHENYLEPHRINE 40 MCG/ML (10ML) SYRINGE FOR IV PUSH (FOR BLOOD PRESSURE SUPPORT)
PREFILLED_SYRINGE | INTRAVENOUS | Status: AC
  Filled 2017-02-06: qty 10

## 2017-02-06 MED ORDER — PROPOFOL 10 MG/ML IV BOLUS
INTRAVENOUS | Status: AC
  Filled 2017-02-06: qty 20

## 2017-02-06 MED ORDER — NEOSTIGMINE METHYLSULFATE 5 MG/5ML IV SOSY
PREFILLED_SYRINGE | INTRAVENOUS | Status: DC | PRN
  Administered 2017-02-06: 4 mg via INTRAVENOUS

## 2017-02-06 MED ORDER — ROCURONIUM BROMIDE 10 MG/ML (PF) SYRINGE
PREFILLED_SYRINGE | INTRAVENOUS | Status: DC | PRN
  Administered 2017-02-06 (×2): 10 mg via INTRAVENOUS
  Administered 2017-02-06: 60 mg via INTRAVENOUS

## 2017-02-06 MED ORDER — SODIUM CHLORIDE 0.9 % IJ SOLN
INTRAMUSCULAR | Status: DC | PRN
  Administered 2017-02-06 (×2): 10 mL

## 2017-02-06 MED ORDER — MIDAZOLAM HCL 5 MG/5ML IJ SOLN
INTRAMUSCULAR | Status: DC | PRN
  Administered 2017-02-06: 2 mg via INTRAVENOUS

## 2017-02-06 MED ORDER — PROMETHAZINE HCL 25 MG/ML IJ SOLN
12.5000 mg | Freq: Four times a day (QID) | INTRAMUSCULAR | Status: DC | PRN
  Administered 2017-02-06: 12.5 mg via INTRAVENOUS
  Filled 2017-02-06: qty 1

## 2017-02-06 MED ORDER — MIDAZOLAM HCL 2 MG/2ML IJ SOLN
INTRAMUSCULAR | Status: AC
  Filled 2017-02-06: qty 2

## 2017-02-06 MED ORDER — DIPHENHYDRAMINE HCL 50 MG/ML IJ SOLN
INTRAMUSCULAR | Status: AC
  Filled 2017-02-06: qty 1

## 2017-02-06 MED ORDER — NEOSTIGMINE METHYLSULFATE 5 MG/5ML IV SOSY
PREFILLED_SYRINGE | INTRAVENOUS | Status: AC
  Filled 2017-02-06: qty 5

## 2017-02-06 SURGICAL SUPPLY — 89 items
APPLIER CLIP 9.375 MED OPEN (MISCELLANEOUS) ×2
BAG DECANTER FOR FLEXI CONT (MISCELLANEOUS) ×2 IMPLANT
BINDER BREAST LRG (GAUZE/BANDAGES/DRESSINGS) IMPLANT
BINDER BREAST XLRG (GAUZE/BANDAGES/DRESSINGS) ×2 IMPLANT
BLADE 10 SAFETY STRL DISP (BLADE) ×2 IMPLANT
BLADE SURG 10 STRL SS (BLADE) ×4 IMPLANT
BLADE SURG 15 STRL LF DISP TIS (BLADE) ×1 IMPLANT
BLADE SURG 15 STRL SS (BLADE) ×1
BNDG COHESIVE 4X5 TAN STRL (GAUZE/BANDAGES/DRESSINGS) ×2 IMPLANT
CANISTER SUCT 3000ML PPV (MISCELLANEOUS) ×2 IMPLANT
CHLORAPREP W/TINT 26ML (MISCELLANEOUS) ×2 IMPLANT
CLIP APPLIE 9.375 MED OPEN (MISCELLANEOUS) ×1 IMPLANT
COVER MAYO STAND STRL (DRAPES) ×4 IMPLANT
COVER SURGICAL LIGHT HANDLE (MISCELLANEOUS) ×2 IMPLANT
DERMABOND ADVANCED (GAUZE/BANDAGES/DRESSINGS) ×1
DERMABOND ADVANCED .7 DNX12 (GAUZE/BANDAGES/DRESSINGS) ×1 IMPLANT
DRAIN CHANNEL 15F RND FF W/TCR (WOUND CARE) ×2 IMPLANT
DRAIN CHANNEL 19F RND (DRAIN) ×2 IMPLANT
DRAPE HALF SHEET 40X57 (DRAPES) ×6 IMPLANT
DRAPE INCISE 23X17 IOBAN STRL (DRAPES)
DRAPE INCISE IOBAN 23X17 STRL (DRAPES) IMPLANT
DRAPE INCISE IOBAN 85X60 (DRAPES) ×2 IMPLANT
DRAPE ORTHO SPLIT 77X108 STRL (DRAPES) ×2
DRAPE SURG ORHT 6 SPLT 77X108 (DRAPES) ×2 IMPLANT
DRAPE WARM FLUID 44X44 (DRAPE) ×2 IMPLANT
DRSG MEPILEX BORDER 4X8 (GAUZE/BANDAGES/DRESSINGS) IMPLANT
DRSG PAD ABDOMINAL 8X10 ST (GAUZE/BANDAGES/DRESSINGS) IMPLANT
ELECT BLADE 4.0 EZ CLEAN MEGAD (MISCELLANEOUS) ×2
ELECT BLADE 6.5 EXT (BLADE) ×2 IMPLANT
ELECT COATED BLADE 2.86 ST (ELECTRODE) ×6 IMPLANT
ELECT REM PT RETURN 9FT ADLT (ELECTROSURGICAL) ×2
ELECTRODE BLDE 4.0 EZ CLN MEGD (MISCELLANEOUS) ×1 IMPLANT
ELECTRODE REM PT RTRN 9FT ADLT (ELECTROSURGICAL) ×1 IMPLANT
EVACUATOR SILICONE 100CC (DRAIN) ×4 IMPLANT
EXPANDER TISSUE MX 300CC (Breast) ×1 IMPLANT
GAUZE SPONGE 4X4 12PLY STRL (GAUZE/BANDAGES/DRESSINGS) IMPLANT
GAUZE XEROFORM 5X9 LF (GAUZE/BANDAGES/DRESSINGS) IMPLANT
GLOVE BIO SURGEON STRL SZ 6 (GLOVE) ×8 IMPLANT
GLOVE BIO SURGEON STRL SZ 6.5 (GLOVE) ×2 IMPLANT
GLOVE BIO SURGEON STRL SZ7 (GLOVE) ×4 IMPLANT
GLOVE BIOGEL PI IND STRL 7.0 (GLOVE) ×2 IMPLANT
GLOVE BIOGEL PI INDICATOR 7.0 (GLOVE) ×2
GLOVE SURG SS PI 6.0 STRL IVOR (GLOVE) ×2 IMPLANT
GLOVE SURG SS PI 6.5 STRL IVOR (GLOVE) ×2 IMPLANT
GOWN STRL REUS W/ TWL LRG LVL3 (GOWN DISPOSABLE) ×5 IMPLANT
GOWN STRL REUS W/TWL LRG LVL3 (GOWN DISPOSABLE) ×5
KIT BASIN OR (CUSTOM PROCEDURE TRAY) ×2 IMPLANT
KIT FILL SYSTEM UNIVERSAL (SET/KITS/TRAYS/PACK) ×2 IMPLANT
KIT ROOM TURNOVER OR (KITS) ×2 IMPLANT
MARKER SKIN DUAL TIP RULER LAB (MISCELLANEOUS) ×2 IMPLANT
NEEDLE 22X1 1/2 (OR ONLY) (NEEDLE) ×2 IMPLANT
NEEDLE HYPO 25GX1X1/2 BEV (NEEDLE) ×2 IMPLANT
NS IRRIG 1000ML POUR BTL (IV SOLUTION) ×4 IMPLANT
PACK GENERAL/GYN (CUSTOM PROCEDURE TRAY) ×2 IMPLANT
PAD ABD 8X10 STRL (GAUZE/BANDAGES/DRESSINGS) ×4 IMPLANT
PAD ARMBOARD 7.5X6 YLW CONV (MISCELLANEOUS) ×4 IMPLANT
PEN SKIN MARKING BROAD (MISCELLANEOUS) IMPLANT
PENCIL BUTTON HOLSTER BLD 10FT (ELECTRODE) IMPLANT
PIN SAFETY STERILE (MISCELLANEOUS) ×2 IMPLANT
SET COLLECT BLD 21X3/4 12 PB (MISCELLANEOUS) ×2 IMPLANT
SOL PREP POV-IOD 4OZ 10% (MISCELLANEOUS) IMPLANT
SPONGE GAUZE 4X4 12PLY STER LF (GAUZE/BANDAGES/DRESSINGS) IMPLANT
SPONGE LAP 18X18 X RAY DECT (DISPOSABLE) ×4 IMPLANT
STAPLER VISISTAT 35W (STAPLE) ×2 IMPLANT
STOCKINETTE IMPERVIOUS 9X36 MD (GAUZE/BANDAGES/DRESSINGS) ×2 IMPLANT
STRIP CLOSURE SKIN 1/2X4 (GAUZE/BANDAGES/DRESSINGS) ×4 IMPLANT
SUT CHROMIC 4 0 PS 2 18 (SUTURE) IMPLANT
SUT ETHILON 2 0 FS 18 (SUTURE) ×4 IMPLANT
SUT MNCRL AB 4-0 PS2 18 (SUTURE) ×4 IMPLANT
SUT PDS AB 2-0 CT1 27 (SUTURE) ×4 IMPLANT
SUT PDS AB 2-0 CT2 27 (SUTURE) ×2 IMPLANT
SUT VIC AB 3-0 PS2 18 (SUTURE) ×4
SUT VIC AB 3-0 PS2 18XBRD (SUTURE) ×4 IMPLANT
SUT VIC AB 3-0 SH 27 (SUTURE) ×1
SUT VIC AB 3-0 SH 27X BRD (SUTURE) ×1 IMPLANT
SUT VIC AB 3-0 SH 8-18 (SUTURE) ×2 IMPLANT
SUT VIC AB 4-0 PS2 27 (SUTURE) ×2 IMPLANT
SUT VICRYL 4-0 PS2 18IN ABS (SUTURE) ×8 IMPLANT
SUT VLOC 180 0 24IN GS25 (SUTURE) IMPLANT
SYR 10ML LL (SYRINGE) ×2 IMPLANT
SYR 50ML SLIP (SYRINGE) ×2 IMPLANT
SYR BULB IRRIGATION 50ML (SYRINGE) ×2 IMPLANT
SYR CONTROL 10ML LL (SYRINGE) ×2 IMPLANT
TISSUE EXPANDER MX 300CC (Breast) ×2 IMPLANT
TOWEL OR 17X24 6PK STRL BLUE (TOWEL DISPOSABLE) ×2 IMPLANT
TOWEL OR 17X26 10 PK STRL BLUE (TOWEL DISPOSABLE) ×2 IMPLANT
TRAY FOLEY W/METER SILVER 14FR (SET/KITS/TRAYS/PACK) ×2 IMPLANT
TUBE CONNECTING 12X1/4 (SUCTIONS) ×6 IMPLANT
YANKAUER SUCT BULB TIP NO VENT (SUCTIONS) ×4 IMPLANT

## 2017-02-06 NOTE — Anesthesia Postprocedure Evaluation (Signed)
Anesthesia Post Note  Patient: Michele Salas  Procedure(s) Performed: Procedure(s) (LRB): LEFT LATISSIMUS FLAP TO BREAST (Left) TISSUE EXPANDER PLACEMENT LEFT BREAST (Left)     Patient location during evaluation: PACU Anesthesia Type: General Level of consciousness: sedated Pain management: pain level controlled Vital Signs Assessment: post-procedure vital signs reviewed and stable Respiratory status: spontaneous breathing and respiratory function stable Cardiovascular status: stable Anesthetic complications: no    Last Vitals:  Vitals:   02/06/17 1130 02/06/17 1143  BP:  (!) 150/97  Pulse: 66 69  Resp: 14 15  Temp:      Last Pain:  Vitals:   02/06/17 1200  TempSrc:   PainSc: Asleep                 Shaunn Tackitt DANIEL

## 2017-02-06 NOTE — Progress Notes (Signed)
Pt received from PACU at 1300pm. Pt stable, with no noted distress. Pt drowsy but able to follow simple commands. Ambulated from the stretcher to the bed, slow and steady. Language barrier pt stated that she can speak little [place]. Surgical site dry and intact gauze dressing. x2 JP drains in place. No family at bedside. Call bell within reach. Will continue to monitor.

## 2017-02-06 NOTE — Anesthesia Procedure Notes (Signed)
Procedure Name: Intubation Date/Time: 02/06/2017 7:21 AM Performed by: Garrison Columbus T Pre-anesthesia Checklist: Patient identified, Emergency Drugs available, Suction available and Patient being monitored Patient Re-evaluated:Patient Re-evaluated prior to inductionOxygen Delivery Method: Circle System Utilized Preoxygenation: Pre-oxygenation with 100% oxygen Intubation Type: IV induction Ventilation: Mask ventilation without difficulty and Oral airway inserted - appropriate to patient size Laryngoscope Size: Sabra Heck and 2 Grade View: Grade I Tube type: Oral Tube size: 7.5 mm Number of attempts: 1 Airway Equipment and Method: Stylet and Oral airway Placement Confirmation: ETT inserted through vocal cords under direct vision,  positive ETCO2 and breath sounds checked- equal and bilateral Secured at: 21 cm Tube secured with: Tape Dental Injury: Teeth and Oropharynx as per pre-operative assessment

## 2017-02-06 NOTE — Op Note (Addendum)
Operative Note   DATE OF OPERATION: 7.2.18  LOCATION: [facility]-observation  SURGICAL DIVISION: Plastic Surgery  PREOPERATIVE DIAGNOSES:  1. History left breast cancer 2. Acquired absence breast 3. History therapeutic radiation  POSTOPERATIVE DIAGNOSES:  same  PROCEDURE:  1. Left latissimus flap for breast reconstruction 2. Placement tissue expander  SURGEON: Irene Limbo MD MBA  ASSISTANT: L. Grandville Silos, RNFA  ANESTHESIA:  General.   EBL: 50 ml  COMPLICATIONS: None immediate.   INDICATIONS FOR PROCEDURE:  The patient, Michele Salas, is a 36 y.o. female born on 1981/06/26, is here for delayed reconstruction following left mastectomy with adjuvant chemotherapy and radiation.   FINDINGS: Natrelle 133MX-11-T 300 ml tissue expander placed, initial fill volume 100 ml. SN [id number]  DESCRIPTION OF PROCEDURE:  The patient's operative site was marked with the patient in the preoperative area. The patient was taken to the operating room. SCDs, Foley catheter were placed and IV antibiotics were given. Patient was placed in right lateral position, and the patient's operative site was prepped and draped in a sterile fashion. A time out was performed and all information was confirmed to be correct. Incision made in prior right chest mastectomy scar. Skin flaps elevated off underlying chest wall and pectoralis muscle. Dissection completed in subcutaneous plane toward axilla. Incision made surrounding skin paddle designed over right back. Skin and superficial fascia elevated off surface of latissimus muscle and subcutaneous tunnel dissected to axilla joining anterior breast cavity. Anterior border of latissimus identified and elevated. Muscle divided inferiorly. Submuscular dissection completed toward midline back and toward origin. Thoracodorsal nerve was divided. Flap rotated into anterior chest cavity. Back irrigated and hemostasis ensured. Exparel infiltrated total 266 mg for chest and back. 15  Fr drain placed and secured with 2-0 nylon. 2-0 PDS used to placed quilting sutures from elevated skin flaps to chest wall. Incision closed with 0 V lock suture in superficial fascia and dermis. Skin closure completed with 4-0 monocryl subcuticular and tissue glue applied.   The flap muscle and skin paddle oriented to defect. The muscle was secured to pectoralis and serratus muscle and inferiorly to abdominal wall fascia with interrupted 2-0 PDS. 70 Fr JP drain placed in breast cavity and secured to chest with 2-0 nylon. The expander was prepared and placed beneath latissimus muscle. The remainder of latissimus inset inferiorly to abdominal wall fascia. Skin paddle inset with interrupted 3-0 and 4-0 vicryl and monocrylin dermis and 4-0 monocryl subcuticular for skin closure. Tissue adhesive applied. The port was accessed and filled to 100 ml. Tissue adhesive applied. Dry dressing and breast binder applied.   The patient was allowed to wake from anesthesia, extubated and taken to the recovery room in satisfactory condition.   SPECIMENS: none  DRAINS: 15 Fr JP in back, 19 Fr JP in left breast reconstruction  Irene Limbo, MD [facility] Plastic & Reconstructive Surgery [phone], pin [phone]

## 2017-02-06 NOTE — Interval H&P Note (Signed)
History and Physical Interval Note:  02/06/2017 6:45 AM  Michele Salas  has presented today for surgery, with the diagnosis of HISTORY OF BREAST CANCER  The various methods of treatment have been discussed with the patient and family. After consideration of risks, benefits and other options for treatment, the patient has consented to  Procedure(s): LEFT LATISSIMUS FLAP TO BREAST (Left) TISSUE EXPANDER PLACEMENT (Left) as a surgical intervention .  The patient's history has been reviewed, patient examined, no change in status, stable for surgery.  I have reviewed the patient's chart and labs.  Questions were answered to the patient's satisfaction.     [name]

## 2017-02-06 NOTE — Transfer of Care (Signed)
Immediate Anesthesia Transfer of Care Note  Patient: Michele Salas  Procedure(s) Performed: Procedure(s): LEFT LATISSIMUS FLAP TO BREAST (Left) TISSUE EXPANDER PLACEMENT LEFT BREAST (Left)  Patient Location: PACU  Anesthesia Type:General  Level of Consciousness: drowsy and responds to stimulation  Airway & Oxygen Therapy: Patient Spontanous Breathing and Patient connected to nasal cannula oxygen  Post-op Assessment: Report given to RN and Post -op Vital signs reviewed and stable  Post vital signs: Reviewed and stable  Last Vitals:  Vitals:   02/06/17 0627 02/06/17 1100  BP: 129/87 125/79  Pulse: 74 76  Resp: 18 19  Temp: 36.8 C 36.4 C    Last Pain:  Vitals:   02/06/17 0627  TempSrc: Oral         Complications: No apparent anesthesia complications

## 2017-02-07 ENCOUNTER — Encounter (HOSPITAL_COMMUNITY): Payer: Self-pay | Admitting: Plastic Surgery

## 2017-02-07 MED ORDER — OXYCODONE HCL 5 MG PO TABS: 5.0000 mg | ORAL_TABLET | ORAL | 0 refills | Status: DC | PRN

## 2017-02-07 MED ORDER — SULFAMETHOXAZOLE-TRIMETHOPRIM 800-160 MG PO TABS: 1.0000 | ORAL_TABLET | Freq: Two times a day (BID) | ORAL | 0 refills | Status: DC

## 2017-02-07 NOTE — Progress Notes (Signed)
Interpreter Lesle Chris for patient

## 2017-02-07 NOTE — Discharge Summary (Signed)
Physician Discharge Summary  Patient ID: Michele Salas MRN: [id number] DOB/AGE: 36-23-1982 36 y.o.  Admit date: 02/06/2017 Discharge date: 02/07/2017  Admission Diagnoses: History breast cancer, acquired absence breast, history therapeutic radiation  Discharge Diagnoses:  Same  Discharged Condition: stable  Hospital Course: Patient had nausea on POD#0 that resolved by POD#1 and was able to tolerate diet. She ambulated with assist in room and to bathroom. Oral pain medication tolerated.  Treatments: surgery: left latissimus flap with placement tissue expander  Discharge Exam: Blood pressure 106/68, pulse 90, temperature 99.3 F (37.4 C), temperature source Oral, resp. rate 18, height 5' 2"  (1.575 m), weight 68.8 kg (151 lb 10.8 oz), last menstrual period 01/27/2017, SpO2 98 %. Incision/Wound: back incision intact flat, JPs serosanguinous, left chest soft with viable flap  Disposition: 01-Home or Self Care  Discharge Instructions    Call MD for:  redness, tenderness, or signs of infection (pain, swelling, bleeding, redness, odor or green/yellow discharge around incision site)    Complete by:  As directed    Call MD for:  temperature >100.5    Complete by:  As directed    Discharge instructions    Complete by:  As directed    Ok to remove dressings and shower am 7.4.18. Soap and water ok, pat incisions dry. No creams or ointments over incisions. Do not let drains dangle in shower, attach to lanyard or similar.Strip and record drains twice daily and bring log to clinic visit.  Breast binder or soft compression bra all other times.  Ok to raise arms above shoulders for bathing and dressing.  No house yard work or exercise until cleared by MD.   Madaline Brilliant to take ibuprofen or naproxen or tylenol for pain. Recommend Miralax as directed for constipation.   Driving Restrictions    Complete by:  As directed    No driving if taking narcotics   Lifting restrictions    Complete by:  As  directed    No lifting greater than 5 lbs   Resume previous diet    Complete by:  As directed      Allergies as of 02/07/2017      Reactions   No Known Allergies       Medication List    TAKE these medications   lidocaine-prilocaine cream Commonly known as:  EMLA Apply a quarter size amount to port site 1 hour prior to chemo. Do not rub in. Cover with plastic wrap.   oxyCODONE 5 MG immediate release tablet Commonly known as:  Oxy IR/ROXICODONE Take 1-2 tablets (5-10 mg total) by mouth every 4 (four) hours as needed for moderate pain.   sulfamethoxazole-trimethoprim 800-160 MG tablet Commonly known as:  BACTRIM DS,SEPTRA DS Take 1 tablet by mouth 2 (two) times daily.      Follow-up Information    Irene Limbo, MD Follow up in 1 week(s).   Specialty:  Plastic Surgery Why:  as scheduled Contact information: [place] [address] [phone]           Signed: Irene Limbo 02/07/2017, 8:40 AM

## 2017-02-07 NOTE — Progress Notes (Signed)
Discharged to home; Discussed written and verbal discharge instructions with patient and spouse; verbalized understanding. Teach back done regarding care of jp drains and how to measure and keep a log of output. F/u appointment discussed; prescriptions given. Removed mepilex from sacrum. Left unit via wheelchair, accompanied by family and transportation nursing technician

## 2017-02-21 ENCOUNTER — Ambulatory Visit (HOSPITAL_COMMUNITY): Payer: Self-pay | Admitting: Adult Health

## 2017-02-21 ENCOUNTER — Encounter (HOSPITAL_BASED_OUTPATIENT_CLINIC_OR_DEPARTMENT_OTHER): Payer: Self-pay | Admitting: Adult Health

## 2017-02-21 ENCOUNTER — Encounter (HOSPITAL_COMMUNITY): Payer: Self-pay | Attending: Oncology

## 2017-02-21 ENCOUNTER — Encounter (HOSPITAL_COMMUNITY): Payer: Self-pay | Admitting: Adult Health

## 2017-02-21 VITALS — BP 119/67 | HR 69 | Temp 97.5°F | Resp 16 | Ht 62.0 in | Wt 152.5 lb

## 2017-02-21 DIAGNOSIS — K7581 Nonalcoholic steatohepatitis (NASH): Secondary | ICD-10-CM

## 2017-02-21 DIAGNOSIS — N644 Mastodynia: Secondary | ICD-10-CM | POA: Insufficient documentation

## 2017-02-21 DIAGNOSIS — Z853 Personal history of malignant neoplasm of breast: Secondary | ICD-10-CM

## 2017-02-21 DIAGNOSIS — R51 Headache: Secondary | ICD-10-CM | POA: Insufficient documentation

## 2017-02-21 DIAGNOSIS — R1013 Epigastric pain: Secondary | ICD-10-CM | POA: Insufficient documentation

## 2017-02-21 DIAGNOSIS — K219 Gastro-esophageal reflux disease without esophagitis: Secondary | ICD-10-CM | POA: Insufficient documentation

## 2017-02-21 DIAGNOSIS — C50112 Malignant neoplasm of central portion of left female breast: Secondary | ICD-10-CM

## 2017-02-21 DIAGNOSIS — Z79899 Other long term (current) drug therapy: Secondary | ICD-10-CM | POA: Insufficient documentation

## 2017-02-21 DIAGNOSIS — C50912 Malignant neoplasm of unspecified site of left female breast: Secondary | ICD-10-CM | POA: Insufficient documentation

## 2017-02-21 DIAGNOSIS — Z171 Estrogen receptor negative status [ER-]: Secondary | ICD-10-CM

## 2017-02-21 MED ORDER — HEPARIN SOD (PORK) LOCK FLUSH 100 UNIT/ML IV SOLN
500.0000 [IU] | Freq: Once | INTRAVENOUS | Status: AC
  Administered 2017-02-21: 500 [IU] via INTRAVENOUS
  Filled 2017-02-21: qty 5

## 2017-02-21 MED ORDER — SODIUM CHLORIDE 0.9% FLUSH
10.0000 mL | INTRAVENOUS | Status: DC | PRN
  Administered 2017-02-21: 10 mL via INTRAVENOUS
  Filled 2017-02-21: qty 10

## 2017-02-21 NOTE — Progress Notes (Signed)
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      09/23/2015 Initial Biopsy    US guided core biopsy of left breast mass and suspicious left axillary lymph nodes.      09/23/2015 Pathology Results    Invasive ductal carcinoma, high grade.  Left axilla lymph node is positive for metastatic carcinoma.  ER/PR NEGATIVE, HER2 NEGATIVE, KI-67 marker of 85%.      10/26/2015 Imaging    Bone scan- No evidence of metastatic disease to the skeleton.      10/27/2015 Imaging    CT CAP-  Retroareolar mass with 2 asymmetrically prominent left axillary lymph nodes and an upper normal size left subpectoral lymph node. No other findings of metastatic disease in the chest, abdomen, or pelvis.      11/05/2015 Echocardiogram    The estimated ejection fraction was in the range of 60% to 65%.      11/23/2015 Miscellaneous    Genetic Counseling- genetic test result was negative for any known pathogenic mutations within any of 28 genes that would cause her to be at an increased genetic risk for breast, ovarian, or other related cancers.      12/09/2015 Surgery    L radical mastectomy, axillary LN dissection, port a cath insertion with Dr. Aviva Signs 12/09/2015      12/11/2015 Pathology Results    - INVASIVE GRADE III DUCTAL CARCINOMA, SPANNING 2.3 CM IN GREATEST DIMENSION. - ASSOCIATED HIGH GRADE DCIS - MARGINS ARE NEGATIVE. - TWO LYMPH NODES POSITIVE FOR  METASTATIC DUCTAL CARCINOMA (2/2). - EIGHT ADDITIONAL LYMPH NODES WITH NO TUMOR SEEN      12/11/2015 Pathology Results    HER2 NEGATIVE, ER 0%, PR 0%, Ki-67 marker 90%      12/25/2015 - 02/12/2016 Chemotherapy    AC every 2 weeks x 4 cycles      02/04/2016 Treatment Plan Change    Treatment deferred x 7 days due to neutropenia      02/25/2016 - 05/12/2016 Chemotherapy    Paclitaxel weekly x 12      06/01/2016 - 07/19/2016 Radiation Therapy    33 fractions, Dr. Lisbeth Renshaw in [place]      02/06/2017 Surgery    (L) breast reconstruction (lat flap with tissue expander) with Dr. Iran Planas        INTERVAL HISTORY:  Michele Salas 36 y.o. female returns to cancer center for routine follow-up for left triple negative breast cancer.  She is here today with her friend.   She had liver biopsy in 10/2016 which revealed no evidence of malignancy, but did show NASH. She has recovered well from this procedure and denies any ongoing GI/abd complaints. Appetite and energy levels are both 100%. Weight is stable today at ~152 lbs (down about 4 lbs from 11/2016, but weight was 152 lbs in 09/2016).   She recently underwent left breast latissimus dorsi flap reconstruction with tissue expander placement on 02/06/17 with Dr. Iran Planas.  She still has (L) chest drain in place and is scheduled to see her tomorrow for follow-up.  She continues to have some tenderness in the area, but overall feels very well.    She denies any changes to her right breast including lung/mass, nipple changes, or skin changes. She had mammogram in 10/2016 of the right breast which was negative.  She would like to know when she can have her Port-A-Cath removed.  Otherwise she is largely without complaints today.    *Note: Patient was seen today with the help of Cresenciano Genre, [employer], who was present for the entire visit today including the physical exam.    REVIEW OF SYSTEMS:  Review of Systems  Constitutional:  Negative.  Negative for chills, fatigue and fever.  HENT:  Negative.  Negative for lump/mass and nosebleeds.   Eyes: Negative.   Respiratory: Negative.  Negative for cough and shortness of breath.   Cardiovascular: Negative.  Negative for chest pain and leg swelling.  Gastrointestinal: Negative.  Negative for abdominal pain, blood in stool, constipation, diarrhea, nausea and vomiting.  Endocrine: Negative.   Genitourinary: Negative.  Negative for dysuria and hematuria.        Menses have resumed and are normal per pt report   Musculoskeletal: Negative.  Negative for arthralgias.  Skin: Positive for wound (recent (L) breast reconstruction surgery; drain in place and healing chest wall wound). Negative for rash.  Neurological: Negative.  Negative for dizziness and headaches.  Hematological: Negative.  Negative for adenopathy. Does not bruise/bleed easily.  Psychiatric/Behavioral: Negative.  Negative for depression and sleep disturbance. The patient is not nervous/anxious.      PAST MEDICAL/SURGICAL HISTORY:  Past Medical History:  Diagnosis Date  . Breast cancer, left ([place]) 10/22/2015  . Iron deficiency anemia 10/27/2015  . NASH (nonalcoholic steatohepatitis)    from physician note  . PONV (postoperative nausea and vomiting)    Past Surgical History:  Procedure Laterality Date  . LATISSIMUS FLAP TO BREAST Left 02/06/2017   Procedure: LEFT LATISSIMUS FLAP TO BREAST;  Surgeon: Irene Limbo, MD;  Location: [facility];  Service: Plastics;  Laterality: Left;  Marland Kitchen MASTECTOMY MODIFIED RADICAL Left 12/09/2015   Procedure: LEFT MODIFIED RADICAL MASTECTOMY;  Surgeon: Aviva Signs, MD;  Location: [facility];  Service: General;  Laterality: Left;  . PORTACATH PLACEMENT Right 12/09/2015   Procedure: INSERTION PORT-A-CATH RIGHT SUBCLAVIAN;  Surgeon: Aviva Signs, MD;  Location: [facility];  Service: General;  Laterality: Right;  . TISSUE EXPANDER PLACEMENT Left 02/06/2017   Procedure: TISSUE EXPANDER PLACEMENT LEFT  BREAST;  Surgeon: Irene Limbo, MD;  Location: [facility];  Service: Plastics;  Laterality: Left;     SOCIAL HISTORY:  Social History   Social History  . Marital status: Married    Spouse name: N/A  . Number of children: N/A  . Years of education: N/A   Occupational History  . Not on file.   Social History Main Topics  . Smoking status: Passive Smoke Exposure - Never Smoker  . Smokeless tobacco: Never Used  . Alcohol use No  . Drug use: No  . Sexual activity: Yes    Birth control/ protection: None   Other Topics Concern  . Not on file   Social History Narrative  . No narrative on file    FAMILY HISTORY:  Family History  Problem Relation Age of Onset  . Diabetes Mother   . Uterine cancer Paternal Aunt 4  . Heart attack Paternal Uncle  unspecified age  . Heart attack Paternal Grandfather 13  . Other Paternal Uncle        stomach swollen with fluid that had to be removed; lim info    CURRENT MEDICATIONS:  Outpatient Encounter Prescriptions as of 02/21/2017  Medication Sig Note  . lidocaine-prilocaine (EMLA) cream Apply a quarter size amount to port site 1 hour prior to chemo. Do not rub in. Cover with plastic wrap. 01/30/2017: Pt is no longer using this medication  . oxyCODONE (OXY IR/ROXICODONE) 5 MG immediate release tablet Take 1-2 tablets (5-10 mg total) by mouth every 4 (four) hours as needed for moderate pain.   . [DISCONTINUED] sulfamethoxazole-trimethoprim (BACTRIM DS,SEPTRA DS) 800-160 MG tablet Take 1 tablet by mouth 2 (two) times daily.    No facility-administered encounter medications on file as of 02/21/2017.     ALLERGIES:  Allergies  Allergen Reactions  . No Known Allergies      PHYSICAL EXAM:  ECOG Performance status: 1 - Symptomatic; remains independent   Vitals:   02/21/17 1056  BP: 119/67  Pulse: 69  Resp: 16  Temp: (!) 97.5 F (36.4 C)   Filed Weights   02/21/17 1056  Weight: 152 lb 8 oz (69.2 kg)    Physical Exam    Constitutional: She is oriented to person, place, and time and well-developed, well-nourished, and in no distress.  HENT:  Head: Normocephalic.  Mouth/Throat: Oropharynx is clear and moist. No oropharyngeal exudate.  Eyes: Pupils are equal, round, and reactive to light. Conjunctivae are normal. No scleral icterus.  Neck: Normal range of motion. Neck supple.  Cardiovascular: Normal rate, regular rhythm and normal heart sounds.   Pulmonary/Chest: Effort normal and breath sounds normal. No respiratory distress. She has no wheezes. She has no rales.    Abdominal: Soft. Bowel sounds are normal. There is no tenderness.  Musculoskeletal: Normal range of motion. She exhibits edema (Mild (L) upper arm lymphedema ).  Lymphadenopathy:    She has no cervical adenopathy.       Right: No supraclavicular adenopathy present.       Left: No supraclavicular adenopathy present.  Neurological: She is alert and oriented to person, place, and time. No cranial nerve deficit. Gait normal.  Skin: Skin is warm and dry. No rash noted.  Psychiatric: Mood, memory, affect and judgment normal.  Nursing note and vitals reviewed.    LABORATORY DATA:  I have reviewed the labs as listed.  CBC    Component Value Date/Time   WBC 7.1 01/30/2017 1324   RBC 4.70 01/30/2017 1324   HGB 11.2 (L) 01/30/2017 1324   HCT 35.6 (L) 01/30/2017 1324   PLT 350 01/30/2017 1324   MCV 75.7 (L) 01/30/2017 1324   MCH 23.8 (L) 01/30/2017 1324   MCHC 31.5 01/30/2017 1324   RDW 14.5 01/30/2017 1324   LYMPHSABS 1.9 11/21/2016 1110   MONOABS 0.5 11/21/2016 1110   EOSABS 0.2 11/21/2016 1110   BASOSABS 0.0 11/21/2016 1110   CMP Latest Ref Rng & Units 01/30/2017 11/21/2016 09/30/2016  Glucose 65 - 99 mg/dL 122(H) 94 -  BUN 6 - 20 mg/dL 7 8 -  Creatinine 0.44 - 1.00 mg/dL 0.59 0.43(L) -  Sodium 135 - 145 mmol/L 139 138 -  Potassium 3.5 - 5.1 mmol/L 3.6 3.9 -  Chloride 101 - 111 mmol/L 108 108 -  CO2 22 - 32 mmol/L 23 25 -  Calcium  8.9 - 10.3 mg/dL 9.4 9.1 -  Total Protein 6.5 - 8.1 g/dL  7.2 7.0 7.9  Total Bilirubin 0.3 - 1.2 mg/dL 0.7 0.6 0.7  Alkaline Phos 38 - 126 U/L 131(H) 129(H) 128(H)  AST 15 - 41 U/L 55(H) 117(H) 225(H)  ALT 14 - 54 U/L 87(H) 198(H) 348(H)    PENDING LABS:    DIAGNOSTIC IMAGING:  *The following radiologic images and reports have been reviewed independently and agree with below findings.  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 Genetic testing negative. Treated with (L) mastectomy with axillary lymph node dissection. Received adjuvant chemotherapy with Adriamycin/Cytoxan x 4, followed by weekly Taxol x 12; completed chemotherapy on 05/12/16.  Went on to complete post-mastectomy radiation on 07/19/16. Recently underwent breast reconstruction with Dr. Iran Planas on 02/06/17 with latissimus flap/tissue expander.  -Continuing to recover from recent breast reconstruction; JP drain x 1 remains in place. She has scheduled follow-up with Dr. Iran Planas on 02/22/17 per her report.  -Clinical breast exam of right breast only performed today (given recent left chest wall surgery).  No evidence of malignancy.   -Right breast screening mammogram completed 10/2016 and was negative. She will be due for annual mammogram in 10/2017; will place orders at subsequent follow-up visits.  -Reviewed NCCN Guidelines with her including surveillance recommendations with clinical breast exams/H&P every 3-4 months and annual mammography. She understands that we do not routinely perform CT scans/PET/MRI for surveillance, but defer scans to only when clinically indicated.  Discussed that anti-estrogen therapy for her is not recommended since her breast cancer was ER negative.  -Return to cancer center in 4 months with labs.   Port-a-cath maintenance:    -Continue port flushes every 2 months.  -Discussed with her that our preference is generally to keep the port-a-cath in place for most patients at least 1 year after completion of chemotherapy in case there is cancer recurrence.  She completed her chemotherapy in 05/2017.   -When she returns to cancer center in 06/2017, we can discuss further and make arrangements to have her port removed by Dr. Arnoldo Morale (he placed the port) sometime in December if all continues to go well. She agrees with this plan.   NASH:  -CT abd done in 09/2016 for elevated liver enzymes; mild hepatic steatosis noted without evidence of hepatic neoplasm or biliary ductal dilatation.  She underwent liver biopsy on 10/24/16 which revealed steatohepatitis, most consistent with non-alcoholic fatty liver disease.  -Managed/followed by GI (Dr. Oneida Alar).        Dispo:  -Continue port flush every 2 months.  -Return to cancer center in 4 months for follow-up with port flush/labs.    All questions were answered to patient's stated satisfaction. Encouraged patient to call with any new concerns or questions before her next visit to the cancer center and we can certain see her sooner, if needed.    Plan of care discussed with Dr. Talbert Cage, who agrees with the above aforementioned.    Orders placed this encounter:  No orders of the defined types were placed in this encounter.     Mike Craze, NP [place] [phone]

## 2017-02-21 NOTE — Progress Notes (Signed)
Michele Salas presented for Portacath access and flush.  Portacath located right chest wall accessed with  H 20 needle.  Good blood return present. Portacath flushed with 46m NS and 500U/56mHeparin and needle removed intact.  Procedure tolerated well and without incident.  Discharged ambulatory.

## 2017-02-21 NOTE — Patient Instructions (Addendum)
[place] at [facility] Discharge Instructions  RECOMMENDATIONS MADE BY THE CONSULTANT AND ANY TEST RESULTS WILL BE SENT TO YOUR REFERRING PHYSICIAN.  You were seen today by Mike Craze NP. Continue Port flushes every 2 months. Return in 4 months for labs, port flush and follow up.    Thank you for choosing [place] at [facility] to provide your oncology and hematology care.  To afford each patient quality time with our provider, please arrive at least 15 minutes before your scheduled appointment time.    If you have a lab appointment with the [place] please come in thru the  Main Entrance and check in at the main information desk  You need to re-schedule your appointment should you arrive 10 or more minutes late.  We strive to give you quality time with our providers, and arriving late affects you and other patients whose appointments are after yours.  Also, if you no show three or more times for appointments you may be dismissed from the clinic at the providers discretion.     Again, thank you for choosing [facility].  Our hope is that these requests will decrease the amount of time that you wait before being seen by our physicians.       _____________________________________________________________  Should you have questions after your visit to [facility], please contact our office at [phone] between the hours of 8:30 a.m. and 4:30 p.m.  Voicemails left after 4:30 p.m. will not be returned until the following business day.  For prescription refill requests, have your pharmacy contact our office.       Resources For Cancer Patients and their Caregivers ? American Cancer Society: Can assist with transportation, wigs, general needs, runs Look Good Feel Better.        254-129-6206 ? Cancer Care: Provides financial assistance, online support groups, medication/co-pay assistance.   1-800-813-HOPE 646-220-4988) ? [place] Assists [place] cancer patients and their families through emotional , educational and financial support.  (581)295-5246 ? [place] DSS Where to apply for food stamps, Medicaid and utility assistance. 380-706-5577 ? [employer]: Transportation to medical appointments. (202) 245-2073 ? Social Security Administration: May apply for disability if have a Stage IV cancer. (440)163-7136 325-711-3172 ? [employer], Disability and Transit Services: Assists with nutrition, care and transit needs. [place] Support Programs: @10RELATIVEDAYS @ > Cancer Support Group  2nd Tuesday of the month 1pm-2pm, Journey Room  > Creative Journey  3rd Tuesday of the month 1130am-1pm, Journey Room  > Look Good Feel Better  1st Wednesday of the month 10am-12 noon, Journey Room (Call [place] to register [phone])

## 2017-04-24 ENCOUNTER — Encounter (HOSPITAL_COMMUNITY): Payer: Self-pay | Attending: Oncology

## 2017-04-24 ENCOUNTER — Encounter (HOSPITAL_COMMUNITY): Payer: Self-pay

## 2017-04-24 VITALS — BP 125/77 | HR 76 | Temp 98.1°F | Resp 16

## 2017-04-24 DIAGNOSIS — R1013 Epigastric pain: Secondary | ICD-10-CM | POA: Insufficient documentation

## 2017-04-24 DIAGNOSIS — Z95828 Presence of other vascular implants and grafts: Secondary | ICD-10-CM

## 2017-04-24 DIAGNOSIS — N644 Mastodynia: Secondary | ICD-10-CM | POA: Insufficient documentation

## 2017-04-24 DIAGNOSIS — R51 Headache: Secondary | ICD-10-CM | POA: Insufficient documentation

## 2017-04-24 DIAGNOSIS — C50912 Malignant neoplasm of unspecified site of left female breast: Secondary | ICD-10-CM | POA: Insufficient documentation

## 2017-04-24 DIAGNOSIS — Z79899 Other long term (current) drug therapy: Secondary | ICD-10-CM | POA: Insufficient documentation

## 2017-04-24 DIAGNOSIS — Z853 Personal history of malignant neoplasm of breast: Secondary | ICD-10-CM

## 2017-04-24 DIAGNOSIS — Z23 Encounter for immunization: Secondary | ICD-10-CM

## 2017-04-24 DIAGNOSIS — K219 Gastro-esophageal reflux disease without esophagitis: Secondary | ICD-10-CM | POA: Insufficient documentation

## 2017-04-24 MED ORDER — INFLUENZA VAC SPLIT QUAD 0.5 ML IM SUSY
PREFILLED_SYRINGE | INTRAMUSCULAR | Status: AC
  Filled 2017-04-24: qty 0.5

## 2017-04-24 MED ORDER — INFLUENZA VAC SPLIT QUAD 0.5 ML IM SUSY
0.5000 mL | PREFILLED_SYRINGE | Freq: Once | INTRAMUSCULAR | Status: AC
  Administered 2017-04-24: 0.5 mL via INTRAMUSCULAR

## 2017-04-24 MED ORDER — SODIUM CHLORIDE 0.9% FLUSH
10.0000 mL | INTRAVENOUS | Status: DC | PRN
  Administered 2017-04-24: 10 mL via INTRAVENOUS
  Filled 2017-04-24: qty 10

## 2017-04-24 MED ORDER — HEPARIN SOD (PORK) LOCK FLUSH 100 UNIT/ML IV SOLN
500.0000 [IU] | Freq: Once | INTRAVENOUS | Status: AC
  Administered 2017-04-24: 500 [IU] via INTRAVENOUS

## 2017-04-24 NOTE — Patient Instructions (Signed)
[place] at [facility] Discharge Instructions  RECOMMENDATIONS MADE BY THE CONSULTANT AND ANY TEST RESULTS WILL BE SENT TO YOUR REFERRING PHYSICIAN.  You had your port flushed today. Continue getting it flushed every 6-8 weeks. You also got your flu shot today. Follow up as scheduled.  Thank you for choosing [place] at [facility] to provide your oncology and hematology care.  To afford each patient quality time with our provider, please arrive at least 15 minutes before your scheduled appointment time.    If you have a lab appointment with the [place] please come in thru the  Main Entrance and check in at the main information desk  You need to re-schedule your appointment should you arrive 10 or more minutes late.  We strive to give you quality time with our providers, and arriving late affects you and other patients whose appointments are after yours.  Also, if you no show three or more times for appointments you may be dismissed from the clinic at the providers discretion.     Again, thank you for choosing [facility].  Our hope is that these requests will decrease the amount of time that you wait before being seen by our physicians.       _____________________________________________________________  Should you have questions after your visit to [facility], please contact our office at [phone] between the hours of 8:30 a.m. and 4:30 p.m.  Voicemails left after 4:30 p.m. will not be returned until the following business day.  For prescription refill requests, have your pharmacy contact our office.       Resources For Cancer Patients and their Caregivers ? American Cancer Society: Can assist with transportation, wigs, general needs, runs Look Good Feel Better.        (612)269-7148 ? Cancer Care: Provides financial assistance, online support groups, medication/co-pay assistance.  1-800-813-HOPE  217 044 5488) ? [place] Assists [place] cancer patients and their families through emotional , educational and financial support.  203-579-0478 ? [place] DSS Where to apply for food stamps, Medicaid and utility assistance. 651-672-1816 ? [employer]: Transportation to medical appointments. (832)668-1190 ? Social Security Administration: May apply for disability if have a Stage IV cancer. 2090184549 9098486275 ? [employer], Disability and Transit Services: Assists with nutrition, care and transit needs. [place] Support Programs: @10RELATIVEDAYS @ > Cancer Support Group  2nd Tuesday of the month 1pm-2pm, Journey Room  > Creative Journey  3rd Tuesday of the month 1130am-1pm, Journey Room  > Look Good Feel Better  1st Wednesday of the month 10am-12 noon, Journey Room (Call [place] to register [phone])

## 2017-04-24 NOTE — Progress Notes (Signed)
Michele Salas presented for Portacath access and flush. Portacath located right chest wall accessed with  H 20 needle. Good blood return present. Portacath flushed with 32m NS and 500U/581mHeparin and needle removed intact. Procedure without incident. Patient tolerated procedure well.  Michele Clausonresents today for injection per MD orders. Fluarix administered IM in left deltoid. Administration without incident. Patient tolerated well.  Patient tolerated treatments without incidence. Patient discharged ambulatory and in stable condition from clinic. Patient to follow up as scheduled.

## 2017-05-08 NOTE — H&P (Signed)
  Subjective:     Patient ID: Michele Salas is a 36 y.o. female.  HPI  3 months post op delayed left reconstruction with TE and latissimus flap.   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 Completed adjuvant chemotherapy, and adjuvant radiation completed 12.12.17.  Genetics negative. Screening MMG 3.2018 normal  Reports size 34/36 B, desires larger.       Objective:   Physical Exam  Cardiovascular: Normal rate, regular rhythm and normal heart sounds.   Pulmonary/Chest: Effort normal and breath sounds normal.   Chest:  Right chest port Right no ptosis, soft tissue pinch 4 cm SN to nipple R 22 cm BW R 13 L 13 cm Nipple to IMF R 5   Assessment:     S/p left mastectomy, adjuvant radiation S/p left LD+TE reconstruction    Plan:     Confirmed with Dr. Talbert Cage, plan port removal.   Plan right augmentation, left removal TE and placement implant. Reviewed OP surgery, no drains expected.  Plan right IMF approach, dual plane.  Discussed saline vs silicone, smooth vs round. Reviewed risks implant including animation, contracture, infection requiring removal, rupture, rippling, extrusion, changes with aging. Discussed saline vs silicone, round vs shaped. Reviewed MRI surveillance silicone implants. Reviewed rates rupture, rippling concerns with each. Discussed specifically that she is not insured and if she moves or  care no longer covered via current agreement, then surgery for rupture or for surveillance could be financial burden.  Patient has elected for saline. Plan smooth round.  Natrelle 133MX-11-T 300 ml tissue expander placed,  232m total fill volume.   BIrene Limbo MD [facility] [phone] pin [phone]

## 2017-05-16 ENCOUNTER — Encounter (HOSPITAL_BASED_OUTPATIENT_CLINIC_OR_DEPARTMENT_OTHER): Payer: Self-pay | Admitting: *Deleted

## 2017-05-16 NOTE — Pre-Procedure Instructions (Signed)
To come pick up Ensure pre-surgery drink 10 oz. - to drink by 0415 DOS.

## 2017-05-17 NOTE — Progress Notes (Signed)
Ensure pre surgery drink given with instructions to complete by 0415 dos, pt verbalized understanding. 

## 2017-05-18 NOTE — Pre-Procedure Instructions (Signed)
Alis will be interpreter for pt., per Bethena Roys at [place] for [facility]; please call [phone] if surgery time changes.

## 2017-05-19 ENCOUNTER — Ambulatory Visit (HOSPITAL_BASED_OUTPATIENT_CLINIC_OR_DEPARTMENT_OTHER)
Admission: RE | Admit: 2017-05-19 | Discharge: 2017-05-19 | Disposition: A | Discharge status: Home / Self Care | Payer: Self-pay | Source: Ambulatory Visit | Attending: Plastic Surgery | Admitting: Plastic Surgery

## 2017-05-19 ENCOUNTER — Ambulatory Visit (HOSPITAL_BASED_OUTPATIENT_CLINIC_OR_DEPARTMENT_OTHER): Payer: Self-pay | Admitting: Anesthesiology

## 2017-05-19 ENCOUNTER — Encounter (HOSPITAL_BASED_OUTPATIENT_CLINIC_OR_DEPARTMENT_OTHER)
Admission: RE | Disposition: A | Discharge status: Home / Self Care | Payer: Self-pay | Source: Ambulatory Visit | Attending: Plastic Surgery

## 2017-05-19 ENCOUNTER — Encounter (HOSPITAL_BASED_OUTPATIENT_CLINIC_OR_DEPARTMENT_OTHER): Payer: Self-pay

## 2017-05-19 DIAGNOSIS — Z452 Encounter for adjustment and management of vascular access device: Secondary | ICD-10-CM | POA: Insufficient documentation

## 2017-05-19 DIAGNOSIS — Z853 Personal history of malignant neoplasm of breast: Secondary | ICD-10-CM | POA: Insufficient documentation

## 2017-05-19 DIAGNOSIS — N651 Disproportion of reconstructed breast: Secondary | ICD-10-CM | POA: Insufficient documentation

## 2017-05-19 DIAGNOSIS — Z8619 Personal history of other infectious and parasitic diseases: Secondary | ICD-10-CM | POA: Insufficient documentation

## 2017-05-19 DIAGNOSIS — Z923 Personal history of irradiation: Secondary | ICD-10-CM | POA: Insufficient documentation

## 2017-05-19 HISTORY — DX: Personal history of antineoplastic chemotherapy: Z92.21

## 2017-05-19 HISTORY — PX: REMOVAL OF TISSUE EXPANDER AND PLACEMENT OF IMPLANT: SHX6457

## 2017-05-19 HISTORY — PX: BREAST ENHANCEMENT SURGERY: SHX7

## 2017-05-19 HISTORY — DX: Personal history of malignant neoplasm of breast: Z85.3

## 2017-05-19 HISTORY — PX: PORT-A-CATH REMOVAL: SHX5289

## 2017-05-19 SURGERY — REMOVAL, TISSUE EXPANDER, BREAST, WITH IMPLANT INSERTION
Anesthesia: General | Site: Chest | Laterality: Right

## 2017-05-19 MED ORDER — LIDOCAINE 2% (20 MG/ML) 5 ML SYRINGE
INTRAMUSCULAR | Status: DC | PRN
  Administered 2017-05-19: 80 mg via INTRAVENOUS

## 2017-05-19 MED ORDER — FENTANYL CITRATE (PF) 100 MCG/2ML IJ SOLN
INTRAMUSCULAR | Status: AC
  Filled 2017-05-19: qty 2

## 2017-05-19 MED ORDER — BUPIVACAINE HCL (PF) 0.5 % IJ SOLN
INTRAMUSCULAR | Status: DC | PRN
  Administered 2017-05-19: 30 mL

## 2017-05-19 MED ORDER — CHLORHEXIDINE GLUCONATE CLOTH 2 % EX PADS: 6.0000 | MEDICATED_PAD | Freq: Once | CUTANEOUS | Status: DC

## 2017-05-19 MED ORDER — GENTAMICIN SULFATE 40 MG/ML IJ SOLN
INTRAMUSCULAR | Status: DC | PRN
  Administered 2017-05-19: 1000 mL

## 2017-05-19 MED ORDER — SCOPOLAMINE 1 MG/3DAYS TD PT72
1.0000 | MEDICATED_PATCH | Freq: Once | TRANSDERMAL | Status: AC | PRN
  Administered 2017-05-19: 1 via TRANSDERMAL

## 2017-05-19 MED ORDER — LIDOCAINE 2% (20 MG/ML) 5 ML SYRINGE
INTRAMUSCULAR | Status: AC
  Filled 2017-05-19: qty 5

## 2017-05-19 MED ORDER — ACETAMINOPHEN 500 MG PO TABS
ORAL_TABLET | ORAL | Status: AC
  Filled 2017-05-19: qty 2

## 2017-05-19 MED ORDER — GABAPENTIN 300 MG PO CAPS
300.0000 mg | ORAL_CAPSULE | ORAL | Status: AC
  Administered 2017-05-19: 300 mg via ORAL

## 2017-05-19 MED ORDER — ACETAMINOPHEN 500 MG PO TABS
1000.0000 mg | ORAL_TABLET | ORAL | Status: AC
  Administered 2017-05-19: 1000 mg via ORAL

## 2017-05-19 MED ORDER — LACTATED RINGERS IV SOLN: INTRAVENOUS | Status: DC

## 2017-05-19 MED ORDER — DEXAMETHASONE SODIUM PHOSPHATE 10 MG/ML IJ SOLN
INTRAMUSCULAR | Status: AC
  Filled 2017-05-19: qty 1

## 2017-05-19 MED ORDER — MEPERIDINE HCL 25 MG/ML IJ SOLN: 6.2500 mg | INTRAMUSCULAR | Status: DC | PRN

## 2017-05-19 MED ORDER — MIDAZOLAM HCL 2 MG/2ML IJ SOLN
INTRAMUSCULAR | Status: AC
  Filled 2017-05-19: qty 2

## 2017-05-19 MED ORDER — ONDANSETRON HCL 4 MG/2ML IJ SOLN
INTRAMUSCULAR | Status: AC
  Filled 2017-05-19: qty 2

## 2017-05-19 MED ORDER — PROPOFOL 10 MG/ML IV BOLUS
INTRAVENOUS | Status: DC | PRN
  Administered 2017-05-19: 150 mg via INTRAVENOUS

## 2017-05-19 MED ORDER — GABAPENTIN 300 MG PO CAPS
ORAL_CAPSULE | ORAL | Status: AC
  Filled 2017-05-19: qty 1

## 2017-05-19 MED ORDER — FENTANYL CITRATE (PF) 100 MCG/2ML IJ SOLN
25.0000 ug | INTRAMUSCULAR | Status: DC | PRN
  Administered 2017-05-19: 50 ug via INTRAVENOUS

## 2017-05-19 MED ORDER — ONDANSETRON HCL 4 MG/2ML IJ SOLN
INTRAMUSCULAR | Status: DC | PRN
  Administered 2017-05-19: 4 mg via INTRAVENOUS

## 2017-05-19 MED ORDER — MIDAZOLAM HCL 2 MG/2ML IJ SOLN
1.0000 mg | INTRAMUSCULAR | Status: DC | PRN
  Administered 2017-05-19: 2 mg via INTRAVENOUS

## 2017-05-19 MED ORDER — CEFAZOLIN SODIUM-DEXTROSE 2-4 GM/100ML-% IV SOLN
2.0000 g | INTRAVENOUS | Status: AC
  Administered 2017-05-19: 2 g via INTRAVENOUS

## 2017-05-19 MED ORDER — SUGAMMADEX SODIUM 200 MG/2ML IV SOLN
INTRAVENOUS | Status: AC
  Filled 2017-05-19: qty 2

## 2017-05-19 MED ORDER — SULFAMETHOXAZOLE-TRIMETHOPRIM 800-160 MG PO TABS: 1.0000 | ORAL_TABLET | Freq: Two times a day (BID) | ORAL | 0 refills | Status: DC

## 2017-05-19 MED ORDER — CELECOXIB 200 MG PO CAPS
200.0000 mg | ORAL_CAPSULE | ORAL | Status: AC
  Administered 2017-05-19: 200 mg via ORAL

## 2017-05-19 MED ORDER — FENTANYL CITRATE (PF) 100 MCG/2ML IJ SOLN
50.0000 ug | INTRAMUSCULAR | Status: AC | PRN
  Administered 2017-05-19: 100 ug via INTRAVENOUS
  Administered 2017-05-19 (×4): 25 ug via INTRAVENOUS

## 2017-05-19 MED ORDER — ROCURONIUM BROMIDE 100 MG/10ML IV SOLN
INTRAVENOUS | Status: DC | PRN
  Administered 2017-05-19: 60 mg via INTRAVENOUS

## 2017-05-19 MED ORDER — LACTATED RINGERS IV SOLN
INTRAVENOUS | Status: DC
  Administered 2017-05-19 (×2): via INTRAVENOUS

## 2017-05-19 MED ORDER — CEFAZOLIN SODIUM-DEXTROSE 2-4 GM/100ML-% IV SOLN
INTRAVENOUS | Status: AC
  Filled 2017-05-19: qty 100

## 2017-05-19 MED ORDER — BUPIVACAINE HCL (PF) 0.25 % IJ SOLN
INTRAMUSCULAR | Status: AC
  Filled 2017-05-19: qty 30

## 2017-05-19 MED ORDER — METOCLOPRAMIDE HCL 5 MG/ML IJ SOLN
INTRAMUSCULAR | Status: AC
  Filled 2017-05-19: qty 2

## 2017-05-19 MED ORDER — HYDROCODONE-ACETAMINOPHEN 5-325 MG PO TABS: 1.0000 | ORAL_TABLET | ORAL | 0 refills | Status: DC | PRN

## 2017-05-19 MED ORDER — LIDOCAINE HCL (CARDIAC) 10 MG/ML IV SOLN: INTRAVENOUS | Status: DC | PRN

## 2017-05-19 MED ORDER — SUGAMMADEX SODIUM 200 MG/2ML IV SOLN
INTRAVENOUS | Status: DC | PRN
  Administered 2017-05-19: 200 mg via INTRAVENOUS

## 2017-05-19 MED ORDER — BUPIVACAINE HCL (PF) 0.5 % IJ SOLN
INTRAMUSCULAR | Status: AC
  Filled 2017-05-19: qty 30

## 2017-05-19 MED ORDER — CELECOXIB 200 MG PO CAPS
ORAL_CAPSULE | ORAL | Status: AC
  Filled 2017-05-19: qty 1

## 2017-05-19 MED ORDER — DEXAMETHASONE SODIUM PHOSPHATE 4 MG/ML IJ SOLN
INTRAMUSCULAR | Status: DC | PRN
  Administered 2017-05-19: 10 mg via INTRAVENOUS

## 2017-05-19 MED ORDER — PROPOFOL 500 MG/50ML IV EMUL
INTRAVENOUS | Status: AC
  Filled 2017-05-19: qty 50

## 2017-05-19 MED ORDER — METOCLOPRAMIDE HCL 5 MG/ML IJ SOLN
10.0000 mg | Freq: Once | INTRAMUSCULAR | Status: AC | PRN
  Administered 2017-05-19: 5 mg via INTRAVENOUS

## 2017-05-19 SURGICAL SUPPLY — 80 items
BAG DECANTER FOR FLEXI CONT (MISCELLANEOUS) ×5 IMPLANT
BENZOIN TINCTURE PRP APPL 2/3 (GAUZE/BANDAGES/DRESSINGS) IMPLANT
BINDER BREAST 3XL (GAUZE/BANDAGES/DRESSINGS) IMPLANT
BINDER BREAST LRG (GAUZE/BANDAGES/DRESSINGS) IMPLANT
BINDER BREAST MEDIUM (GAUZE/BANDAGES/DRESSINGS) ×5 IMPLANT
BINDER BREAST XLRG (GAUZE/BANDAGES/DRESSINGS) IMPLANT
BINDER BREAST XXLRG (GAUZE/BANDAGES/DRESSINGS) IMPLANT
BLADE CLIPPER SURG (BLADE) IMPLANT
BLADE SURG 10 STRL SS (BLADE) ×15 IMPLANT
BLADE SURG 15 STRL LF DISP TIS (BLADE) IMPLANT
BLADE SURG 15 STRL SS (BLADE)
BNDG GAUZE ELAST 4 BULKY (GAUZE/BANDAGES/DRESSINGS) ×10 IMPLANT
CANISTER SUCT 1200ML W/VALVE (MISCELLANEOUS) ×5 IMPLANT
CHLORAPREP W/TINT 26ML (MISCELLANEOUS) ×10 IMPLANT
COVER BACK TABLE 60X90IN (DRAPES) ×5 IMPLANT
COVER MAYO STAND STRL (DRAPES) ×5 IMPLANT
DECANTER SPIKE VIAL GLASS SM (MISCELLANEOUS) IMPLANT
DERMABOND ADVANCED (GAUZE/BANDAGES/DRESSINGS) ×2
DERMABOND ADVANCED .7 DNX12 (GAUZE/BANDAGES/DRESSINGS) ×8 IMPLANT
DRAIN CHANNEL 15F RND FF W/TCR (WOUND CARE) IMPLANT
DRAPE LAPAROTOMY 100X72 PEDS (DRAPES) IMPLANT
DRAPE TOP ARMCOVERS (MISCELLANEOUS) ×5 IMPLANT
DRAPE U-SHAPE 76X120 STRL (DRAPES) ×5 IMPLANT
DRAPE UTILITY XL STRL (DRAPES) IMPLANT
DRSG PAD ABDOMINAL 8X10 ST (GAUZE/BANDAGES/DRESSINGS) ×10 IMPLANT
DRSG TEGADERM 2-3/8X2-3/4 SM (GAUZE/BANDAGES/DRESSINGS) ×5 IMPLANT
ELECT BLADE 4.0 EZ CLEAN MEGAD (MISCELLANEOUS) ×5
ELECT COATED BLADE 2.86 ST (ELECTRODE) ×5 IMPLANT
ELECT REM PT RETURN 9FT ADLT (ELECTROSURGICAL) ×5
ELECTRODE BLDE 4.0 EZ CLN MEGD (MISCELLANEOUS) ×4 IMPLANT
ELECTRODE REM PT RTRN 9FT ADLT (ELECTROSURGICAL) ×4 IMPLANT
EVACUATOR SILICONE 100CC (DRAIN) IMPLANT
GAUZE SPONGE 4X4 12PLY STRL LF (GAUZE/BANDAGES/DRESSINGS) IMPLANT
GLOVE BIO SURGEON STRL SZ 6 (GLOVE) ×15 IMPLANT
GLOVE BIOGEL PI IND STRL 7.0 (GLOVE) ×8 IMPLANT
GLOVE BIOGEL PI INDICATOR 7.0 (GLOVE) ×2
GLOVE ECLIPSE 6.5 STRL STRAW (GLOVE) ×10 IMPLANT
GOWN STRL REUS W/ TWL LRG LVL3 (GOWN DISPOSABLE) ×8 IMPLANT
GOWN STRL REUS W/TWL LRG LVL3 (GOWN DISPOSABLE) ×2
IMPLANT BREAST SALINE HP 350CC (Breast) ×5 IMPLANT
IV NS 500ML (IV SOLUTION)
IV NS 500ML BAXH (IV SOLUTION) IMPLANT
KIT FILL SYSTEM UNIVERSAL (SET/KITS/TRAYS/PACK) IMPLANT
MARKER SKIN DUAL TIP RULER LAB (MISCELLANEOUS) IMPLANT
NATRELLE SALINE FILLED BREAST IMPLANT (Breast) ×5 IMPLANT
NEEDLE HYPO 25X1 1.5 SAFETY (NEEDLE) IMPLANT
NEEDLE HYPO 30GX1 BEV (NEEDLE) IMPLANT
NEEDLE PRECISIONGLIDE 27X1.5 (NEEDLE) IMPLANT
NS IRRIG 1000ML POUR BTL (IV SOLUTION) ×5 IMPLANT
PACK BASIN DAY SURGERY FS (CUSTOM PROCEDURE TRAY) ×5 IMPLANT
PENCIL BUTTON HOLSTER BLD 10FT (ELECTRODE) ×5 IMPLANT
PIN SAFETY STERILE (MISCELLANEOUS) IMPLANT
SHEET MEDIUM DRAPE 40X70 STRL (DRAPES) ×10 IMPLANT
SIZER BREAST SGL USE 350-380CC (SIZER) ×5
SIZER BREAST SGL USE HP 400CC (SIZER) ×5
SIZER BREAST SGL USE LP 150CC (SIZER) ×5
SIZER BRST SGL USE 350-380CC (SIZER) ×4 IMPLANT
SIZER BRST SGL USE HP 400CC (SIZER) ×4 IMPLANT
SIZER BRST SGL USE LP 150CC (SIZER) ×4 IMPLANT
SLEEVE SCD COMPRESS KNEE MED (MISCELLANEOUS) ×5 IMPLANT
SPONGE GAUZE 2X2 8PLY STRL LF (GAUZE/BANDAGES/DRESSINGS) IMPLANT
SPONGE LAP 18X18 X RAY DECT (DISPOSABLE) ×20 IMPLANT
STAPLER VISISTAT 35W (STAPLE) ×5 IMPLANT
STRIP CLOSURE SKIN 1/2X4 (GAUZE/BANDAGES/DRESSINGS) IMPLANT
SUT ETHILON 2 0 FS 18 (SUTURE) IMPLANT
SUT MNCRL AB 4-0 PS2 18 (SUTURE) ×15 IMPLANT
SUT PDS AB 2-0 CT2 27 (SUTURE) ×5 IMPLANT
SUT VIC AB 3-0 PS1 18 (SUTURE)
SUT VIC AB 3-0 PS1 18XBRD (SUTURE) IMPLANT
SUT VIC AB 3-0 SH 27 (SUTURE) ×2
SUT VIC AB 3-0 SH 27X BRD (SUTURE) ×8 IMPLANT
SUT VICRYL 4-0 PS2 18IN ABS (SUTURE) ×10 IMPLANT
SYR BULB 3OZ (MISCELLANEOUS) IMPLANT
SYR BULB IRRIGATION 50ML (SYRINGE) ×10 IMPLANT
SYR CONTROL 10ML LL (SYRINGE) IMPLANT
TOWEL OR 17X24 6PK STRL BLUE (TOWEL DISPOSABLE) ×10 IMPLANT
TRAY DSU PREP LF (CUSTOM PROCEDURE TRAY) IMPLANT
TUBE CONNECTING 20X1/4 (TUBING) ×10 IMPLANT
UNDERPAD 30X30 (UNDERPADS AND DIAPERS) ×10 IMPLANT
YANKAUER SUCT BULB TIP NO VENT (SUCTIONS) ×5 IMPLANT

## 2017-05-19 NOTE — Op Note (Signed)
Operative Note   DATE OF OPERATION: 10.12.18  LOCATION:  Surgery Center-outpatient  SURGICAL DIVISION: Plastic Surgery  PREOPERATIVE DIAGNOSES:  1. History breast cancer 2. History therapeutic radiation 3. Asymmetry native and reconstructed breast  POSTOPERATIVE DIAGNOSES:  same  PROCEDURE:  1. Removal right chest port 2. Right breast augmentation for symmetry 3. Removal left tissue expander and placement saline implant  SURGEON: Irene Limbo MD MBA  ASSISTANT: none  ANESTHESIA:  General.   EBL: 903 ml  COMPLICATIONS: None immediate.   INDICATIONS FOR PROCEDURE:  The patient, Michele Salas, is a 36 y.o. female born on [birth year], is here for second stage breast reconstruction following delayed left latissimus flap with tissue expander reconstruction.   FINDINGS: RIGHT breast Natrelle Smooth Round Low Profile Saline 125 ml implant, fill volume 145 ml. REF 68LP-125, SN [id number] LEFT chest Natrelle Smooth Round High Profile Saline 350 ml implant, fill volume 350 ml. REF 68HP-350, SN [id number]  DESCRIPTION OF PROCEDURE:  The patient's operative site was marked with the patient in the preoperative area to mark anterior axillary lines, sternal notch and chest midline The patient was taken to the operating room. SCDs were placed and IV antibiotics were given. The patient's operative site was prepped and draped in a sterile fashion. A time out was performed and all information was confirmed to be correct. Iincision made over right chest port scar and carried through superficial fascia to port. Capsule incised and port removed intact. Closure completed with 4-0 vicryl in superficial fascia, 4-0 vicryl in dermis and 4-0 monocryl subcuticular closure. Right inframammary fold marked symmetric with left. Incision made right inframammary fold. Incision carried through superficial fascia and anterolateral border of pectoralis identified. Division of inferior insertions of pectoralis completed  and dual plane dissection completed. Sizer placed. I then addressed left chest. Incision made at inferior inset of latissimus flap skin paddle and carried through subcutaneous tissue to latissimus muscle. Muscle divided in direction fibers and expander removed. Capsulotomies performed superiorly with additional radial scoring. Sizer placed and patient brought to sitting position. Patient assessed for symmetry and 125 ml implant low profile selected for right, 350 ml high profile implant selected for left chest.  Patient was returned to supine position and cavities irrigated with solution containing Ancef, bacitracin, and gentamicin. Hemostasis ensured. Local anesthetic infiltrated bilateral. Right inframammary fold stabilized with interrupted 2-0 PDS from superficial fascia of caudal incision to chest wall. Cavities irrigated with Betadine. Right breast implant placed and filled to 145 ml. Fill tubing removed. Tab closure and implant orientation ensured. Closure completed with 3-0 vicryl to close superficial fascia followed by 4-0 vicryl in dermis and 4-0 monocryl subcuticular skin closure. On left, implant placed and filled to 350 ml. Fill tubing removed. Tab closure and implant orientation ensured. 3-0 vicryl used to close latissimus muscle over implant. 4-0 vicryl used in dermis and skin closure completed with 4-0 monocryl subcuticular. Tissue adhesive applied followed by dry dressing and breast binder.   The patient was allowed to wake from anesthesia, extubated and taken to the recovery room in satisfactory condition.   SPECIMENS: none  DRAINS: none  Irene Limbo, MD [facility] Plastic & Reconstructive Surgery [phone], pin [phone]

## 2017-05-19 NOTE — Transfer of Care (Signed)
Immediate Anesthesia Transfer of Care Note  Patient: Michele Salas  Procedure(s) Performed: REMOVAL OF LEFT TISSUE EXPANDER AND PLACEMENT OF SALINE IMPLANT; RIGHT AUGMENTATION WITH SALINE IMPLANT FOR SYMMETRY (Left Breast) REMOVAL PORT-A-CATH (Right Chest)  Patient Location: PACU  Anesthesia Type:General  Level of Consciousness: sedated and lethargic  Airway & Oxygen Therapy: Patient Spontanous Breathing and Patient connected to face mask oxygen  Post-op Assessment: Report given to RN and Post -op Vital signs reviewed and stable  Post vital signs: Reviewed and stable  Last Vitals:  Vitals:   05/19/17 0651  BP: 130/80  Pulse: 79  Resp: 20  Temp: 36.6 C  SpO2: 100%    Last Pain:  Vitals:   05/19/17 0651  TempSrc: Oral         Complications: No apparent anesthesia complications

## 2017-05-19 NOTE — Anesthesia Postprocedure Evaluation (Signed)
Anesthesia Post Note  Patient: Michele Salas  Procedure(s) Performed: REMOVAL OF LEFT TISSUE EXPANDER AND PLACEMENT OF SALINE IMPLANT; RIGHT AUGMENTATION WITH SALINE IMPLANT FOR SYMMETRY (Left Breast) REMOVAL PORT-A-CATH (Right Chest)     Patient location during evaluation: PACU Anesthesia Type: General Level of consciousness: awake and alert Pain management: pain level controlled Vital Signs Assessment: post-procedure vital signs reviewed and stable Respiratory status: spontaneous breathing, nonlabored ventilation, respiratory function stable and patient connected to nasal cannula oxygen Cardiovascular status: blood pressure returned to baseline and stable Postop Assessment: no apparent nausea or vomiting Anesthetic complications: no    Last Vitals:  Vitals:   05/19/17 1015 05/19/17 1230  BP: (!) 147/99 110/65  Pulse: 71 73  Resp: 14 16  Temp:  36.9 C  SpO2: 100% 100%    Last Pain:  Vitals:   05/19/17 1245  TempSrc:   PainSc: 2                  Montez Hageman

## 2017-05-19 NOTE — Interval H&P Note (Signed)
History and Physical Interval Note:  05/19/2017 6:52 AM  Michele Salas  has presented today for surgery, with the diagnosis of HISTORY OF BREAST CANCER,ASYMMETRY OF ACQUIRED BREAST,HISTORY OF THERAPEUTIC RADIATION  The various methods of treatment have been discussed with the patient and family. After consideration of risks, benefits and other options for treatment, the patient has consented to  Procedure(s): REMOVAL OF LEFT TISSUE EXPANDER AND PLACEMENT OF SALINE IMPLANT; RIGHT AUGMENTATION WITH SALINE IMPLANT FOR SYMMETRY (Left) POSSIBLE REMOVAL PORT-A-CATH (Right) as a surgical intervention .  The patient's history has been reviewed, patient examined, no change in status, stable for surgery.  I have reviewed the patient's chart and labs.  Questions were answered to the patient's satisfaction.     Jolene Guyett

## 2017-05-19 NOTE — Discharge Instructions (Signed)

## 2017-05-19 NOTE — Anesthesia Procedure Notes (Signed)
Procedure Name: Intubation Date/Time: 05/19/2017 7:36 AM Performed by: Lyndee Leo Pre-anesthesia Checklist: Patient identified, Emergency Drugs available, Suction available and Patient being monitored Patient Re-evaluated:Patient Re-evaluated prior to induction Oxygen Delivery Method: Circle system utilized Preoxygenation: Pre-oxygenation with 100% oxygen Induction Type: IV induction Ventilation: Mask ventilation without difficulty Laryngoscope Size: Mac and 3 Grade View: Grade I Tube type: Oral Tube size: 7.0 mm Number of attempts: 1 Airway Equipment and Method: Stylet and Oral airway Placement Confirmation: ETT inserted through vocal cords under direct vision,  positive ETCO2 and breath sounds checked- equal and bilateral Secured at: 21 cm Tube secured with: Tape Dental Injury: Teeth and Oropharynx as per pre-operative assessment

## 2017-05-19 NOTE — Anesthesia Preprocedure Evaluation (Signed)
Anesthesia Evaluation  Patient identified by MRN, date of birth, ID band Patient awake    Reviewed: Allergy & Precautions, NPO status , Patient's Chart, lab work & pertinent test results  History of Anesthesia Complications (+) PONV and history of anesthetic complications  Airway Mallampati: II  TM Distance: >3 FB Neck ROM: Full    Dental no notable dental hx. (+) Dental Advisory Given   Pulmonary neg pulmonary ROS,    Pulmonary exam normal        Cardiovascular negative cardio ROS Normal cardiovascular exam  Study Conclusions  - Left ventricle: The cavity size was normal. Wall thickness was   normal. Systolic function was normal. The estimated ejection   fraction was in the range of 60% to 65%. Wall motion was normal;   there were no regional wall motion abnormalities.    Neuro/Psych  Headaches,    GI/Hepatic negative GI ROS, (+) Hepatitis -, Unspecified  Endo/Other  negative endocrine ROS  Renal/GU negative Renal ROS  negative genitourinary   Musculoskeletal negative musculoskeletal ROS (+)   Abdominal   Peds  Hematology negative hematology ROS (+)   Anesthesia Other Findings Day of surgery medications reviewed with the patient.  Reproductive/Obstetrics                             Anesthesia Physical  Anesthesia Plan  ASA: III  Anesthesia Plan: General   Post-op Pain Management:    Induction: Intravenous  PONV Risk Score and Plan: 4 or greater and Ondansetron, Dexamethasone, Scopolamine patch - Pre-op, Diphenhydramine, Midazolam and Treatment may vary due to age or medical condition  Airway Management Planned: Oral ETT and LMA  Additional Equipment:   Intra-op Plan:   Post-operative Plan: Extubation in OR  Informed Consent: I have reviewed the patients History and Physical, chart, labs and discussed the procedure including the risks, benefits and alternatives for the  proposed anesthesia with the patient or authorized representative who has indicated his/her understanding and acceptance.   Dental advisory given  Plan Discussed with: CRNA and Anesthesiologist  Anesthesia Plan Comments: (Interpreter was used for the interview)        Anesthesia Quick Evaluation

## 2017-05-29 ENCOUNTER — Encounter (HOSPITAL_BASED_OUTPATIENT_CLINIC_OR_DEPARTMENT_OTHER): Payer: Self-pay | Admitting: Plastic Surgery

## 2017-06-22 NOTE — Progress Notes (Signed)
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      09/23/2015 Initial Biopsy    US guided core biopsy of left breast mass and suspicious left axillary lymph nodes.      09/23/2015 Pathology Results    Invasive ductal carcinoma, high grade.  Left axilla lymph node is positive for metastatic carcinoma.  ER/PR NEGATIVE, HER2 NEGATIVE, KI-67 marker of 85%.      10/26/2015 Imaging    Bone scan- No evidence of metastatic disease to the skeleton.      10/27/2015 Imaging    CT CAP-  Retroareolar mass with 2 asymmetrically prominent left axillary lymph nodes and an upper normal size left subpectoral lymph node. No other findings of metastatic disease in the chest, abdomen, or pelvis.      11/05/2015 Echocardiogram    The estimated ejection fraction was in the range of 60% to 65%.      11/23/2015 Miscellaneous    Genetic Counseling- genetic test result was negative for any known pathogenic mutations within any of 28 genes that would cause her to be at an increased genetic risk for breast, ovarian, or other related cancers.      12/09/2015 Surgery    L radical mastectomy, axillary LN dissection, port a cath insertion with Dr. Aviva Signs 12/09/2015      12/11/2015 Pathology Results    - INVASIVE GRADE III DUCTAL CARCINOMA, SPANNING 2.3 CM IN GREATEST DIMENSION. - ASSOCIATED HIGH GRADE DCIS - MARGINS ARE NEGATIVE. - TWO LYMPH NODES POSITIVE FOR  METASTATIC DUCTAL CARCINOMA (2/2). - EIGHT ADDITIONAL LYMPH NODES WITH NO TUMOR SEEN      12/11/2015 Pathology Results    HER2 NEGATIVE, ER 0%, PR 0%, Ki-67 marker 90%      12/25/2015 - 02/12/2016 Chemotherapy    AC every 2 weeks x 4 cycles      02/04/2016 Treatment Plan Change    Treatment deferred x 7 days due to neutropenia      02/25/2016 - 05/12/2016 Chemotherapy    Paclitaxel weekly x 12      06/01/2016 - 07/19/2016 Radiation Therapy    33 fractions, Dr. Lisbeth Renshaw in [place]      02/06/2017 Surgery    (L) breast reconstruction (lat flap with tissue expander) with Dr. Iran Planas        INTERVAL HISTORY:  Ms. Amedee 36 y.o. female returns to cancer center for routine follow-up for left triple negative breast cancer.    Chart reviewed; Underwent breast reconstruction surgery on 05/19/17 with Dr. Iran Planas; she also had her port-a-cath removed at that time.     She shares with me, "I was told to make sure that I tell you that Dr. Iran Planas placed a small implant in my right breast too. She said it would be important for my next mammogram."  She feels she has recovered well from her recent plastic surgery. She tells me she is scheduled to her plastic surgeon again in ~  6 weeks.    Reports not being able to sleep well; she is not taking any medications to try to help. States that she has a hard time falling asleep as well as staying asleep.  She is not sure what is causing her sleep problem. She does have hot flashes at night that bother her.    Reports hand and leg/feet pain that is bilateral and symmetric. "It hurts from my wrist down and from my knees down." States that the pain is generally worse at night. Movement helps the pain.  She has not tried any OTC pain relievers like NSAIDs or Tylenol for this pain.  Denies any neuropathic/burning pain to her extremities.  She also reports some dyspnea on exertion and feeling more fatigued recently.  Denies any shortness of breath at rest, chest  pain, palpitations, or leg swelling.     *Note: Patient was seen today with the help of Gerrit Friends, [employer], who was present for the entire visit today including the physical exam.    REVIEW OF SYSTEMS:  Review of Systems  Constitutional: Positive for fatigue.  HENT:  Negative.   Respiratory: Positive for shortness of breath (dyspnea on exertion).   Cardiovascular: Negative.  Negative for chest pain, leg swelling and palpitations.  Gastrointestinal: Negative.   Endocrine: Negative.   Genitourinary: Negative.  Negative for dysuria and hematuria.   Musculoskeletal: Positive for arthralgias.  Neurological: Negative.  Negative for numbness.  Hematological: Negative.   Psychiatric/Behavioral: Positive for sleep disturbance.     PAST MEDICAL/SURGICAL HISTORY:  Past Medical History:  Diagnosis Date  . History of breast cancer 10/2015   left  . History of chemotherapy 2017  . PONV (postoperative nausea and vomiting)    Past Surgical History:  Procedure Laterality Date  . Augmentation Right Breast with Saline Implant for symmetry Right 05/19/2017   Performed by Irene Limbo, MD at [facility]  . INSERTION PORT-A-CATH RIGHT SUBCLAVIAN Right 12/09/2015   Performed by Aviva Signs, MD at [facility]  . LEFT LATISSIMUS FLAP TO BREAST Left 02/06/2017   Performed by Irene Limbo, MD at [facility]  . LEFT MODIFIED RADICAL MASTECTOMY Left 12/09/2015   Performed by Aviva Signs, MD at [facility]  . REMOVAL OF LEFT TISSUE EXPANDER AND PLACEMENT OF SALINE IMPLANT Left 05/19/2017   Performed by Irene Limbo, MD at [facility]  . REMOVAL PORT-A-CATH Right 05/19/2017   Performed by Irene Limbo, MD at [facility]  . TISSUE EXPANDER PLACEMENT LEFT BREAST Left 02/06/2017   Performed by Irene Limbo, MD at [facility]     SOCIAL HISTORY:  Social History   Socioeconomic History  . Marital status: Married    Spouse name: Not  on file  . Number of children: Not on file  . Years of education: Not on file  . Highest education level: Not on file  Social Needs  . Financial resource strain: Not on file  . Food insecurity - worry: Not on file  . Food insecurity - inability: Not on file  . Transportation needs - medical: Not on file  . Transportation needs - non-medical: Not on file  Occupational History  . Not on file  Tobacco Use  . Smoking status: Never Smoker  . Smokeless tobacco: Never Used  Substance and Sexual Activity  . Alcohol use: No  . Drug use: No  . Sexual activity: Yes    Birth control/protection: None  Other Topics Concern  .  Not on file  Social History Narrative  . Not on file    FAMILY HISTORY:  Family History  Problem Relation Age of Onset  . Diabetes Mother   . Uterine cancer Paternal Aunt 8  . Heart attack Paternal Uncle   . Heart attack Paternal Grandfather 73    CURRENT MEDICATIONS:  Outpatient Encounter Medications as of 06/23/2017  Medication Sig  . gabapentin (NEURONTIN) 300 MG capsule Take 1 capsule (300 mg total) at bedtime by mouth.  Marland Kitchen HYDROcodone-acetaminophen (NORCO) 5-325 MG tablet Take 1-2 tablets by mouth every 4 (four) hours as needed for moderate pain. (Patient not taking: Reported on 06/23/2017)  . sulfamethoxazole-trimethoprim (BACTRIM DS,SEPTRA DS) 800-160 MG tablet Take 1 tablet by mouth 2 (two) times daily. (Patient not taking: Reported on 06/23/2017)   No facility-administered encounter medications on file as of 06/23/2017.     ALLERGIES:  No Active Allergies   PHYSICAL EXAM:  ECOG Performance status: 1 - Symptomatic; remains independent   Vitals:   06/23/17 0944  BP: (!) 142/85  Pulse: 89  Resp: 16  Temp: 98.1 F (36.7 C)  SpO2: 98%   Filed Weights   06/23/17 0944  Weight: 153 lb (69.4 kg)    Physical Exam  Constitutional: She is oriented to person, place, and time and well-developed, well-nourished, and in no distress.  HENT:  Head:  Normocephalic.  Mouth/Throat: Oropharynx is clear and moist. No oropharyngeal exudate.  Eyes: Conjunctivae are normal. Pupils are equal, round, and reactive to light. No scleral icterus.  Neck: Normal range of motion. Neck supple.  Cardiovascular: Normal rate and regular rhythm.  Pulmonary/Chest: Effort normal and breath sounds normal. No respiratory distress. She has no wheezes.    Abdominal: Soft. Bowel sounds are normal. There is no tenderness.  Musculoskeletal: Normal range of motion. She exhibits no edema.  Lymphadenopathy:    She has no cervical adenopathy.       Right: No supraclavicular adenopathy present.       Left: No supraclavicular adenopathy present.  Neurological: She is alert and oriented to person, place, and time. No cranial nerve deficit. Gait normal.  Skin: Skin is warm and dry. No rash noted.  Psychiatric: Mood, memory, affect and judgment normal.  Nursing note and vitals reviewed.    LABORATORY DATA:  I have reviewed the labs as listed.  CBC    Component Value Date/Time   WBC 7.1 01/30/2017 1324   RBC 4.70 01/30/2017 1324   HGB 11.2 (L) 01/30/2017 1324   HCT 35.6 (L) 01/30/2017 1324   PLT 350 01/30/2017 1324   MCV 75.7 (L) 01/30/2017 1324   MCH 23.8 (L) 01/30/2017 1324   MCHC 31.5 01/30/2017 1324   RDW 14.5 01/30/2017 1324   LYMPHSABS 1.9 11/21/2016 1110   MONOABS 0.5 11/21/2016 1110   EOSABS 0.2 11/21/2016 1110   BASOSABS 0.0 11/21/2016 1110   CMP Latest Ref Rng & Units 01/30/2017 11/21/2016 09/30/2016  Glucose 65 - 99 mg/dL 122(H) 94 -  BUN 6 - 20 mg/dL 7 8 -  Creatinine 0.44 - 1.00 mg/dL 0.59 0.43(L) -  Sodium 135 - 145 mmol/L 139 138 -  Potassium 3.5 - 5.1 mmol/L 3.6 3.9 -  Chloride 101 - 111 mmol/L 108 108 -  CO2 22 - 32 mmol/L 23 25 -  Calcium 8.9 - 10.3 mg/dL 9.4 9.1 -  Total Protein 6.5 - 8.1 g/dL 7.2 7.0 7.9  Total Bilirubin 0.3 - 1.2 mg/dL 0.7 0.6 0.7  Alkaline Phos 38 -  126 U/L 131(H) 129(H) 128(H)  AST 15 - 41 U/L 55(H) 117(H) 225(H)    ALT 14 - 54 U/L 87(H) 198(H) 348(H)    PENDING LABS:    DIAGNOSTIC IMAGING:  *The following radiologic images and reports have been reviewed independently and agree with below findings.  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 Genetic testing negative. Treated with (L) mastectomy with axillary lymph node dissection. Received adjuvant chemotherapy with Adriamycin/Cytoxan x 4, followed by weekly Taxol x 12; completed chemotherapy on 05/12/16.  Went on to complete post-mastectomy radiation on 07/19/16. Underwent breast reconstruction with Dr. Iran Planas on 02/06/17 with latissimus flap/tissue expander. Then underwent additional breast reconstruction with Dr. Iran Planas on 05/19/17; she had her port-a-cath removed at that time.  -Clinical breast exam performed today and benign. She appears to be healing well from recent flap reconstruction and placement of bilateral breast implant placement surgery.   -Right breast screening mammogram due in 10/2017; orders placed today for screening mammogram with implant.  -Return to cancer center in 4 months for follow-up with labs.   Hot flashes/Sleep disturbacne:  -Likely secondary to hormone fluctuations s/p chemotherapy.  Her menses has reportedly returned. Hot flashes are affecting her ability to sleep well at night.   -Discussed option of trial of Gabapentin 300 mg at bedtime to help not only with her hot flashes, but since the medication generally causes drowsiness, it may help with her sleep disturbance as well.  We discussed the purpose, mechanism of action, and possible side effects of medication; she agreed to give it a try. E-scribed gabapentin to her pharmacy. My hope is that she also may have some clinical benefit from her reported hand/leg pain.  Encouraged her to call  us if she does not notice symptom improvement with the gabapentin, because we could then consider Effexor XR for her hot flashes if needed.   Bilateral hand/leg/feet pain:  -Difficult for her to describe; denies burning/neuropathic type of pain.  Pain has been present "off and on" for ~3 months. Movement helps.  I suspect she may have an element of arthritis, which could be long-term side effect of chemotherapy.  She has not tried any OTC analgesics like Tylenol or NSAIDs. Encouraged her to try these medications to see if her symptoms improve.  Recommended she follow-up with her PCP if her symptoms do not improve, as she may need referral to rheumatologist for further work-up of her arthralgias.  She agreed with this plan.   Health maintenance/Wellness:  -Encouraged her to increase her physical activity as tolerated. I suspect her mild dyspnea on exertion may be d/t physical deconditioning as she continues to recover from recent several surgeries.  Denies shortness of breat at rest, chest pain, palpitations, or LE swelling. I have low suspicion that her symptoms are d/t more serious cause like PE.  However, encouraged her to contact her PCP if her symptoms worsened.   -Maintain follow-up with PCP as directed.       Dispo:  -Right breast mammogram due in 10/2017; orders placed today.  -Return to cancer center in 4 months for follow-up with labs.    All questions were answered to patient's stated satisfaction. Encouraged patient to call with any new concerns or questions before her next visit to  the cancer center and we can certain see her sooner, if needed.    Plan of care discussed with Dr. Talbert Cage, who agrees with the above aforementioned.    Orders placed this encounter:  Orders Placed This Encounter  Procedures  . MM DIAG BREAST W/IMPLANT TOMO UNI R      Mike Craze, NP [place] [phone]

## 2017-06-23 ENCOUNTER — Encounter (HOSPITAL_COMMUNITY): Payer: Self-pay

## 2017-06-23 ENCOUNTER — Other Ambulatory Visit: Payer: Self-pay

## 2017-06-23 ENCOUNTER — Encounter (HOSPITAL_COMMUNITY): Payer: Self-pay | Admitting: Adult Health

## 2017-06-23 ENCOUNTER — Encounter (HOSPITAL_COMMUNITY): Payer: Self-pay | Attending: Oncology | Admitting: Adult Health

## 2017-06-23 VITALS — BP 142/85 | HR 89 | Temp 98.1°F | Resp 16 | Ht 62.0 in | Wt 153.0 lb

## 2017-06-23 DIAGNOSIS — M79672 Pain in left foot: Secondary | ICD-10-CM

## 2017-06-23 DIAGNOSIS — Z1231 Encounter for screening mammogram for malignant neoplasm of breast: Secondary | ICD-10-CM

## 2017-06-23 DIAGNOSIS — R51 Headache: Secondary | ICD-10-CM | POA: Insufficient documentation

## 2017-06-23 DIAGNOSIS — N644 Mastodynia: Secondary | ICD-10-CM | POA: Insufficient documentation

## 2017-06-23 DIAGNOSIS — Z171 Estrogen receptor negative status [ER-]: Secondary | ICD-10-CM

## 2017-06-23 DIAGNOSIS — M79642 Pain in left hand: Secondary | ICD-10-CM

## 2017-06-23 DIAGNOSIS — M79641 Pain in right hand: Secondary | ICD-10-CM

## 2017-06-23 DIAGNOSIS — Z853 Personal history of malignant neoplasm of breast: Secondary | ICD-10-CM

## 2017-06-23 DIAGNOSIS — R1013 Epigastric pain: Secondary | ICD-10-CM | POA: Insufficient documentation

## 2017-06-23 DIAGNOSIS — M79604 Pain in right leg: Secondary | ICD-10-CM

## 2017-06-23 DIAGNOSIS — Z79899 Other long term (current) drug therapy: Secondary | ICD-10-CM | POA: Insufficient documentation

## 2017-06-23 DIAGNOSIS — G479 Sleep disorder, unspecified: Secondary | ICD-10-CM

## 2017-06-23 DIAGNOSIS — C50912 Malignant neoplasm of unspecified site of left female breast: Secondary | ICD-10-CM | POA: Insufficient documentation

## 2017-06-23 DIAGNOSIS — K219 Gastro-esophageal reflux disease without esophagitis: Secondary | ICD-10-CM | POA: Insufficient documentation

## 2017-06-23 DIAGNOSIS — C50112 Malignant neoplasm of central portion of left female breast: Secondary | ICD-10-CM

## 2017-06-23 DIAGNOSIS — R232 Flushing: Secondary | ICD-10-CM

## 2017-06-23 DIAGNOSIS — M79605 Pain in left leg: Secondary | ICD-10-CM

## 2017-06-23 DIAGNOSIS — M79671 Pain in right foot: Secondary | ICD-10-CM

## 2017-06-23 MED ORDER — GABAPENTIN 300 MG PO CAPS: 300.0000 mg | ORAL_CAPSULE | Freq: Every day | ORAL | 3 refills | Status: DC

## 2017-06-23 NOTE — Patient Instructions (Signed)
[place] at [facility] Discharge Instructions  RECOMMENDATIONS MADE BY THE CONSULTANT AND ANY TEST RESULTS WILL BE SENT TO YOUR REFERRING PHYSICIAN.  You were seen today by Mike Craze, NP Follow up in 4 months after mammogram and labs See schedulers up front for appointments   Thank you for choosing [place] at [facility] to provide your oncology and hematology care.  To afford each patient quality time with our provider, please arrive at least 15 minutes before your scheduled appointment time.    If you have a lab appointment with the [facility] please come in thru the  Main Entrance and check in at the main information desk  You need to re-schedule your appointment should you arrive 10 or more minutes late.  We strive to give you quality time with our providers, and arriving late affects you and other patients whose appointments are after yours.  Also, if you no show three or more times for appointments you may be dismissed from the clinic at the providers discretion.     Again, thank you for choosing [facility].  Our hope is that these requests will decrease the amount of time that you wait before being seen by our physicians.       _____________________________________________________________  Should you have questions after your visit to [facility], please contact our office at [phone] between the hours of 8:30 a.m. and 4:30 p.m.  Voicemails left after 4:30 p.m. will not be returned until the following business day.  For prescription refill requests, have your pharmacy contact our office.       Resources For Cancer Patients and their Caregivers ? American Cancer Society: Can assist with transportation, wigs, general needs, runs Look Good Feel Better.        639-182-7916 ? Cancer Care: Provides financial assistance, online support groups, medication/co-pay assistance.  1-800-813-HOPE  269-623-5665) ? [place] Assists [place] cancer patients and their families through emotional , educational and financial support.  (513) 114-4303 ? [place] DSS Where to apply for food stamps, Medicaid and utility assistance. (757) 585-2413 ? RCATS: Transportation to medical appointments. (204)476-3632 ? Social Security Administration: May apply for disability if have a Stage IV cancer. 610-076-0293 813-210-4168 ? [employer], Disability and Transit Services: Assists with nutrition, care and transit needs. [place] Support Programs: @10RELATIVEDAYS @ > Cancer Support Group  2nd Tuesday of the month 1pm-2pm, Journey Room  > Creative Journey  3rd Tuesday of the month 1130am-1pm, Journey Room  > Look Good Feel Better  1st Wednesday of the month 10am-12 noon, Journey Room (Call [place] to register [phone])

## 2017-07-15 IMAGING — US US BIOPSY
1 series · 13 of 20 positions shown · non-contrast
Comparison: none

CLINICAL DATA: Hepatic steatosis and elevated liver function tests.
The patient requires percutaneous liver biopsy for further
evaluation.

[Series 1: us biopsy · 0.30mm/px · 13 of 20 slices shown]
[im 1/20]
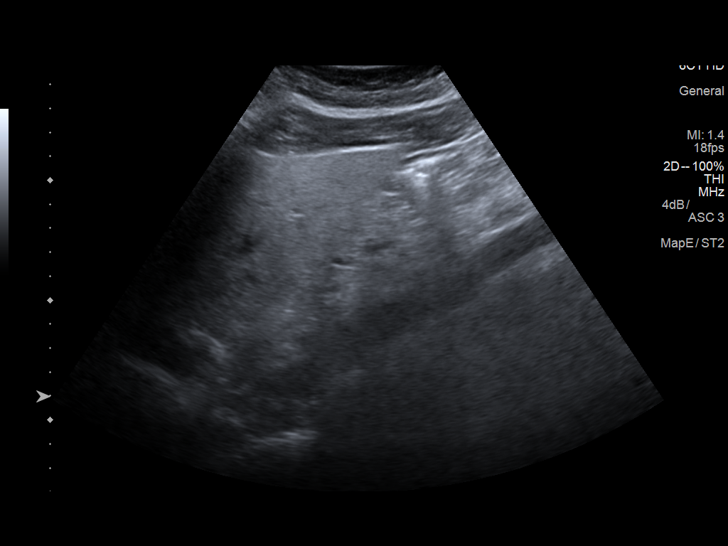
[im 3/20]
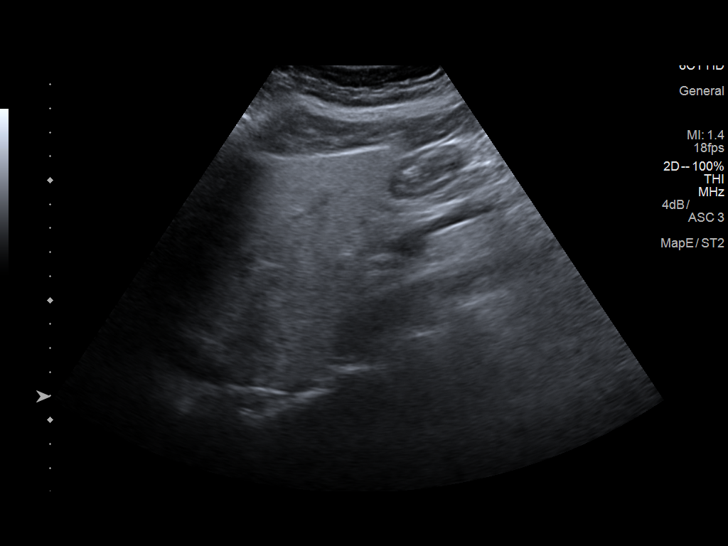
[im 4/20]
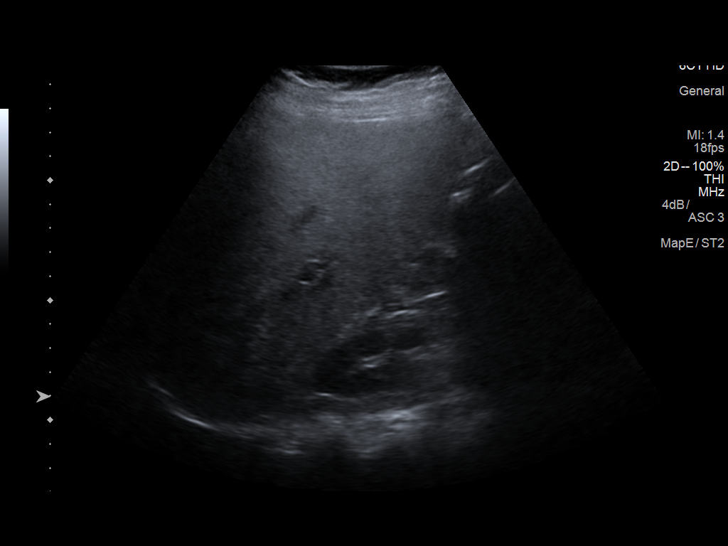
[im 6/20]
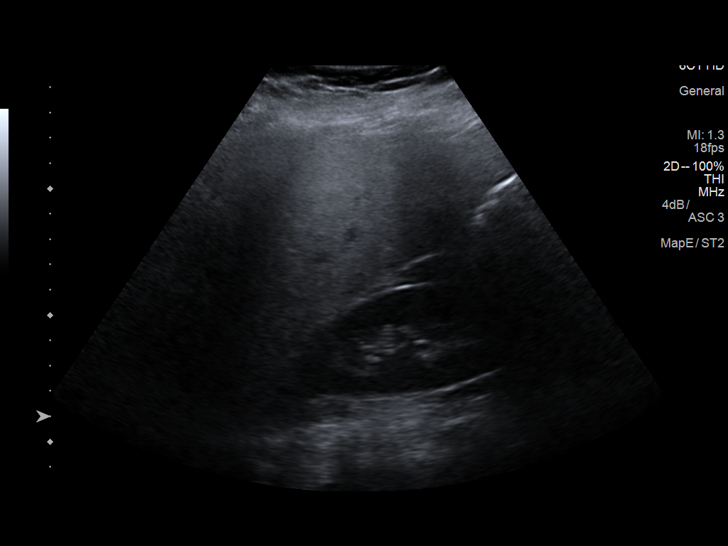
[im 7/20]
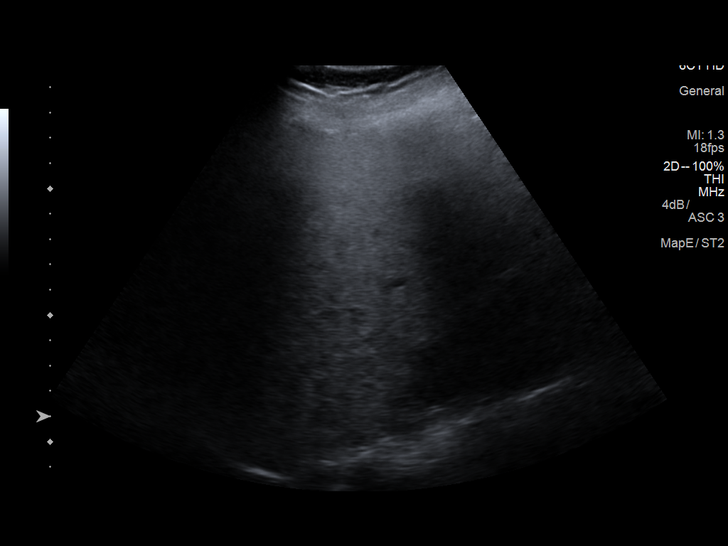
[im 9/20]
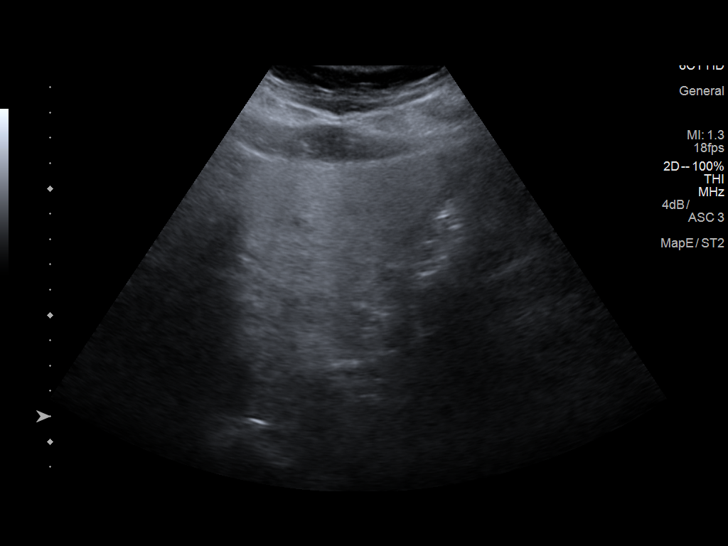
[im 11/20]
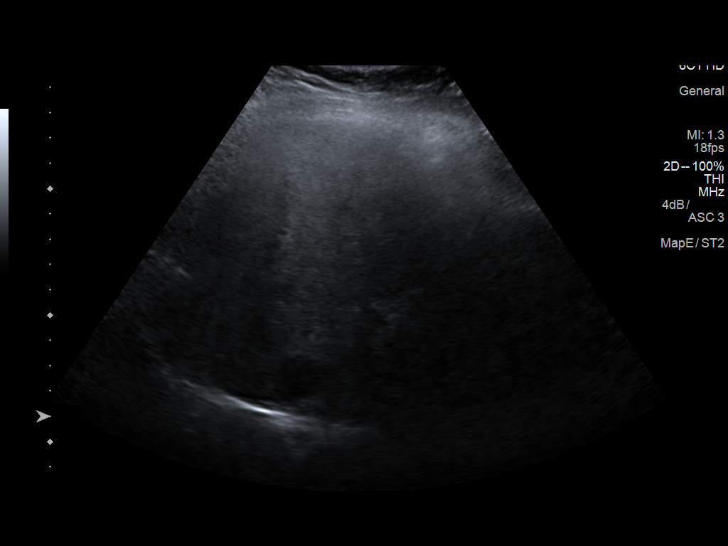
[im 12/20]
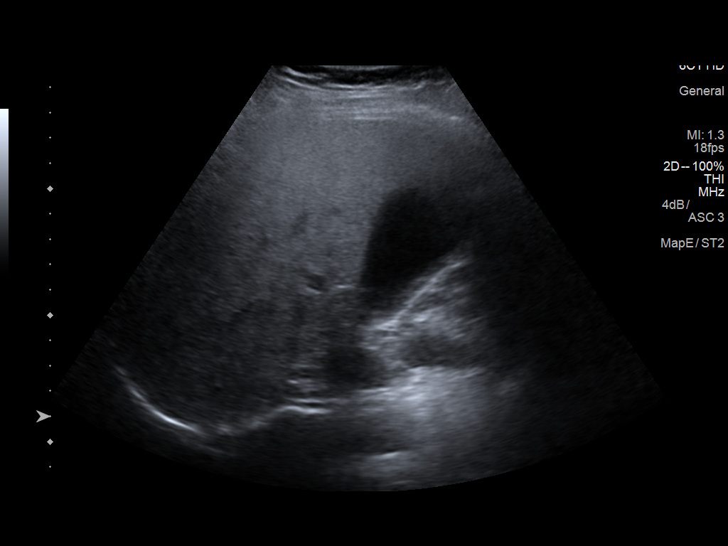
[im 14/20]
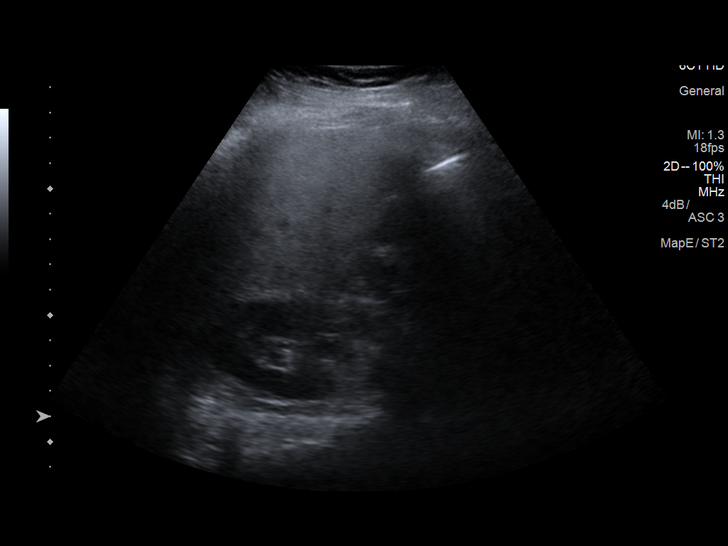
[im 15/20]
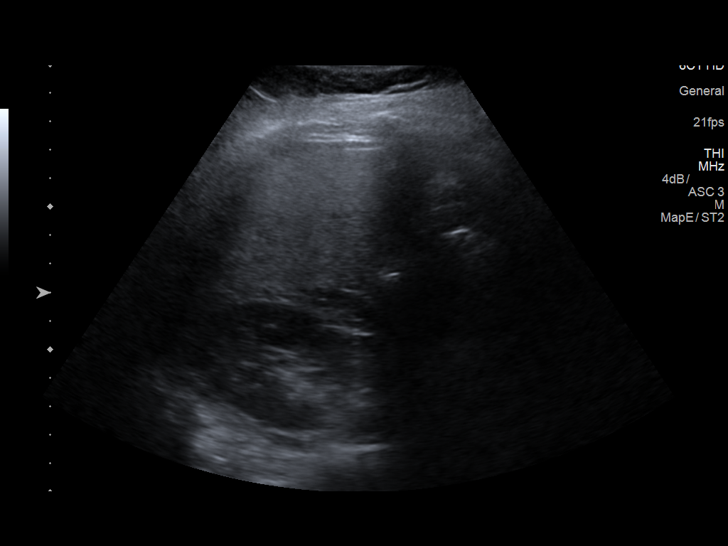
[im 17/20]
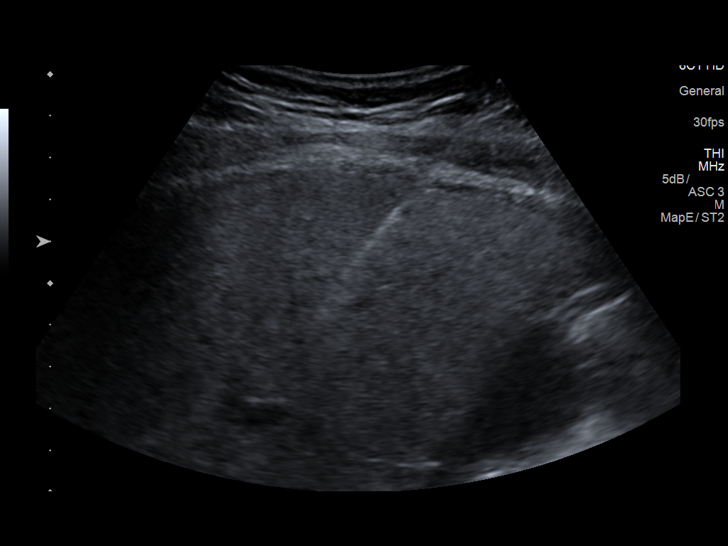
[im 18/20]
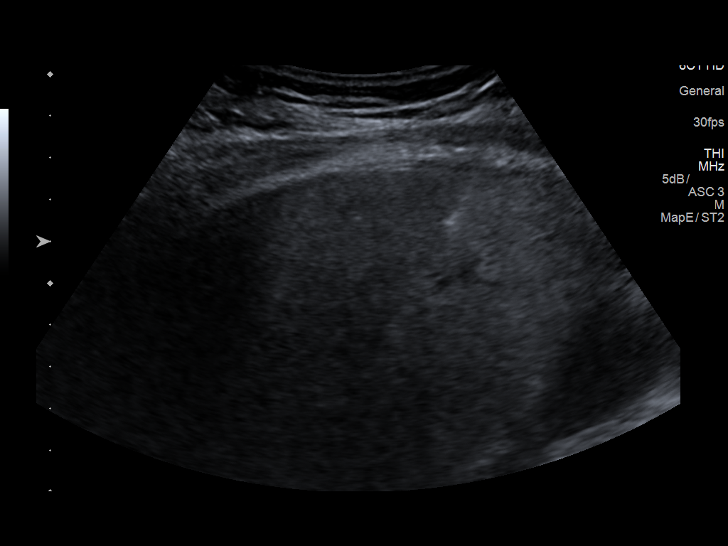
[im 20/20]
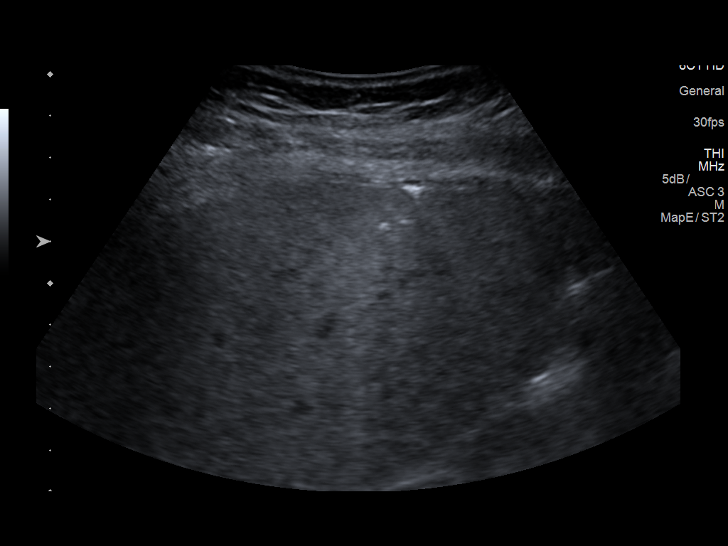

[13 of 20 positions shown; findings below may reference images not displayed]

EXAM:
ULTRASOUND GUIDED CORE BIOPSY OF LIVER

MEDICATIONS:
1.5 mg IV Versed; 75 mcg IV Fentanyl

Total Moderate Sedation Time: 19 minutes.

The patient's level of consciousness and physiologic status were
continuously monitored during the procedure by Radiology nursing.

PROCEDURE:
The procedure, risks, benefits, and alternatives were explained to
the patient. Questions regarding the procedure were encouraged and
answered. The patient understands and consents to the procedure. A
time out was performed prior to initiating the procedure.

The abdominal wall was prepped with chlorhexidine in a sterile
fashion, and a sterile drape was applied covering the operative
field. A sterile gown and sterile gloves were used for the
procedure. Local anesthesia was provided with 1% Lidocaine.

Ultrasound was used to localize the liver. Under direct ultrasound
guidance, a 17 gauge needle was advanced into the right lobe of the
liver. After confirming needle tip position, 2 separate 18 gauge
core biopsy samples were obtained and submitted in formalin.
Gel-Foam pledgets were advanced through the outer needle as it was
retracted. Additional ultrasound was performed.

COMPLICATIONS:
None.
FINDINGS: Solid core biopsy samples were obtained from the liver parenchyma.
IMPRESSION: Ultrasound-guided core biopsy performed within the right lobe of the
liver.

## 2017-09-22 IMAGING — MG 2D DIGITAL SCREENING UNILATERAL RIGHT MAMMOGRAM WITH CAD AND ADJ
5 series · 6 of 13 positions shown · non-contrast
Comparison: Previous exam(s).

CLINICAL DATA: Screening.

EXAM:
2D DIGITAL SCREENING UNILATERAL RIGHT MAMMOGRAM WITH CAD AND ADJUNCT
TOMO

[R MLO (1 of 2)]
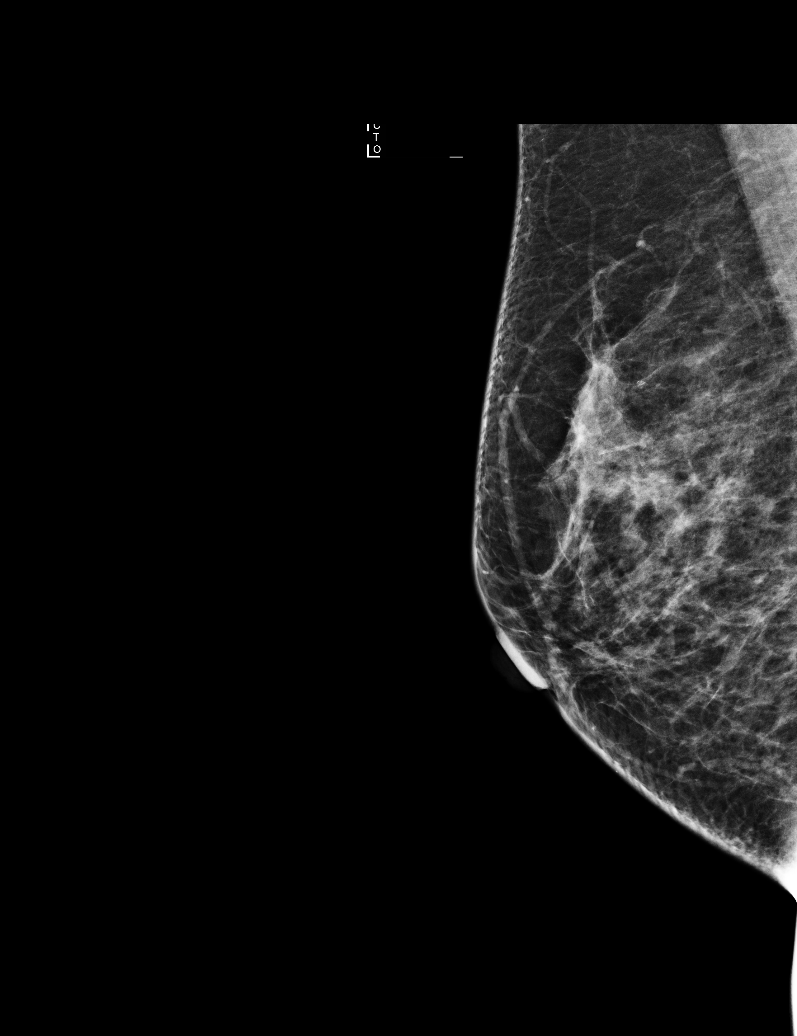

[R MLO (2 of 2)]
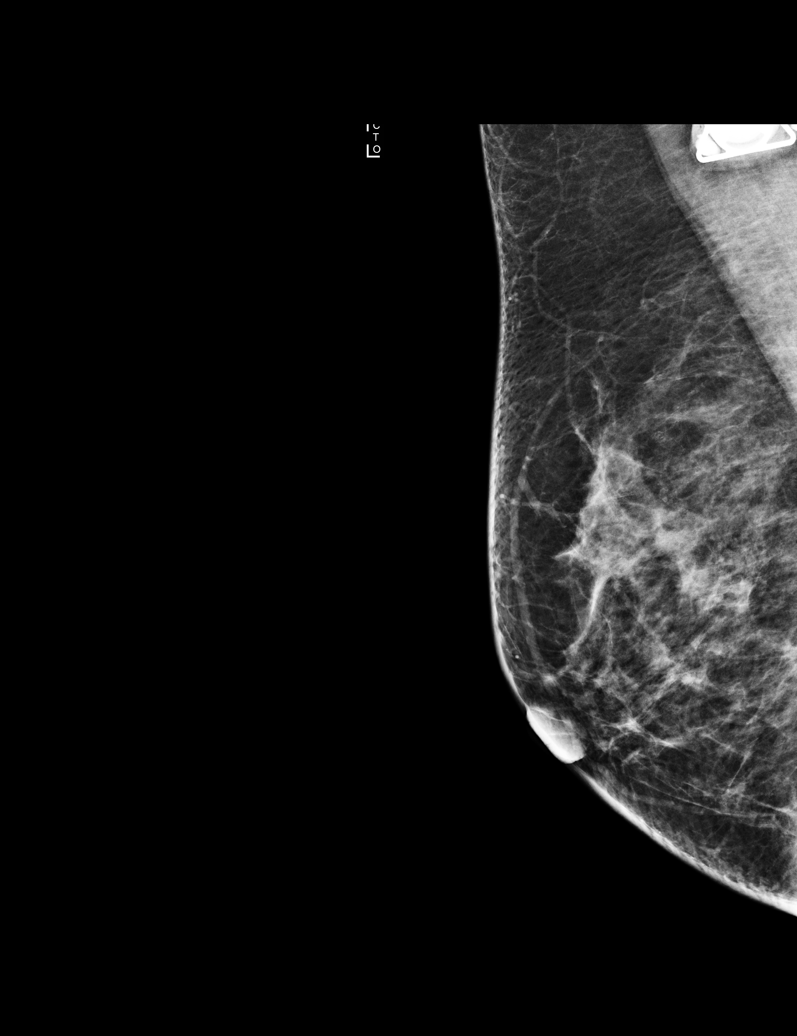

[R CC]
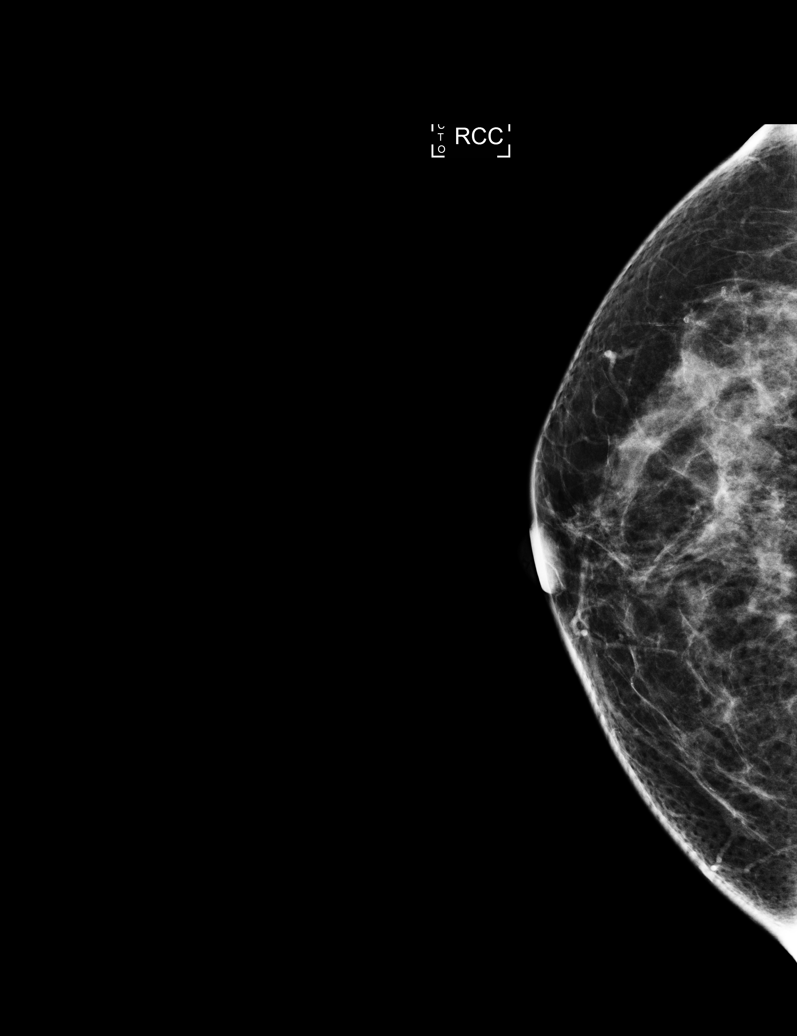

[R MLO tomo · 2 of 67 frames shown]
[frame 22/67]
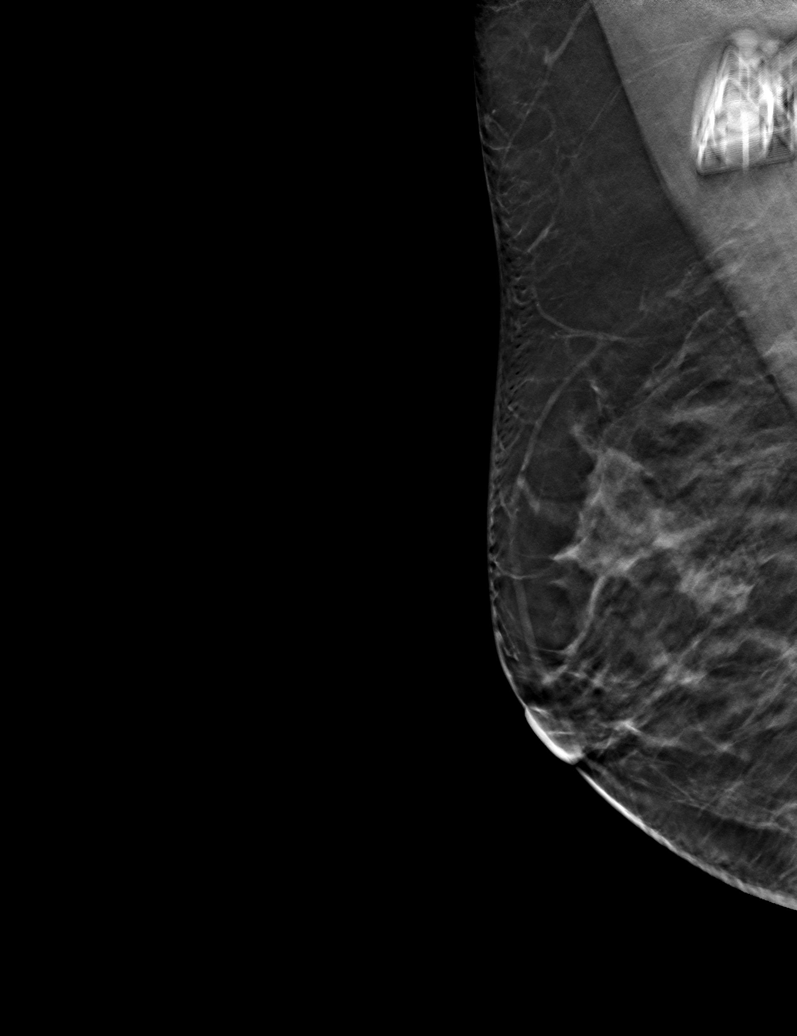
[frame 34/67]
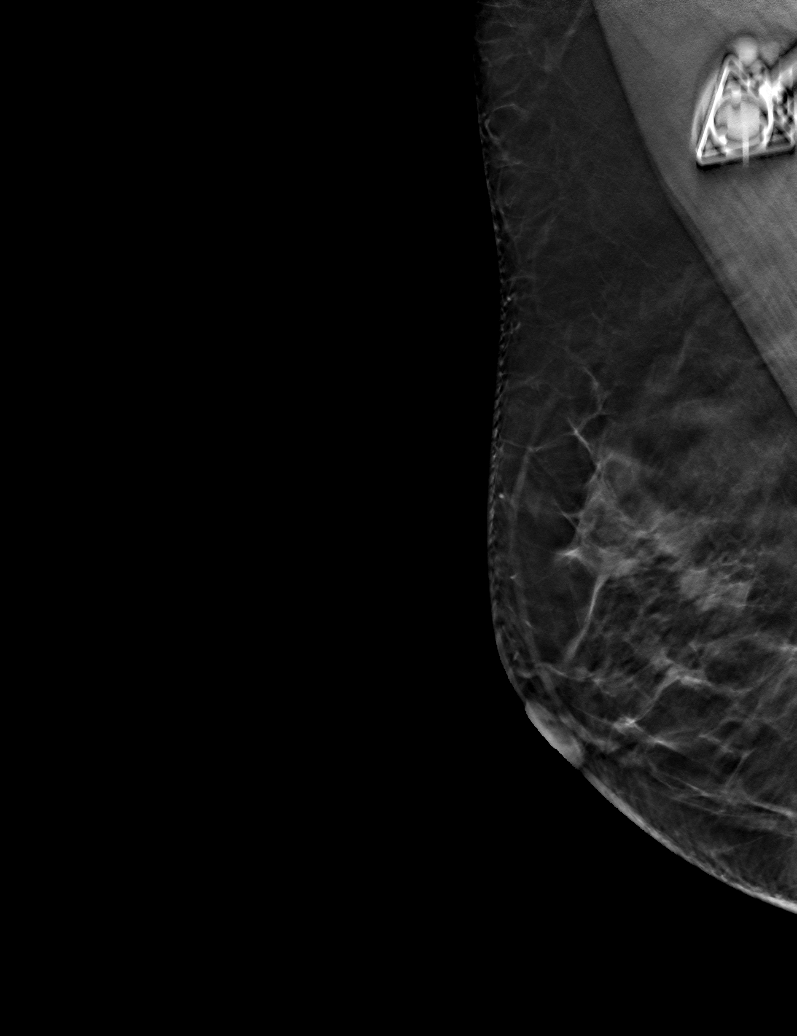

[R CC tomo · tomo slice 30/59.0]
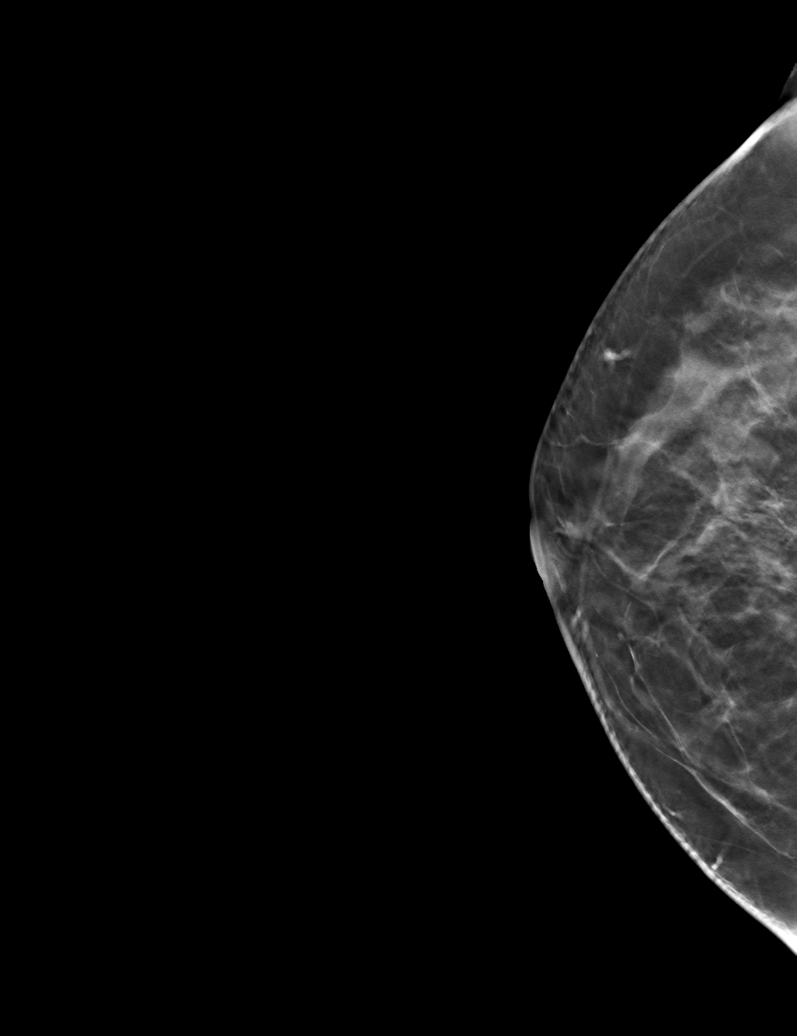

[6 of 13 positions shown; findings below may reference images not displayed]

ACR Breast Density Category c: The breast tissue is heterogeneously
dense, which may obscure small masses.
FINDINGS: There are no findings suspicious for malignancy. Images were
processed with CAD.
IMPRESSION: No mammographic evidence of malignancy. A result letter of this
screening mammogram will be mailed directly to the patient.

RECOMMENDATION:
Screening mammogram at age 40. (Code:[id number])

BI-RADS CATEGORY  1: Negative.

## 2017-10-02 ENCOUNTER — Other Ambulatory Visit (HOSPITAL_COMMUNITY): Payer: Self-pay | Admitting: Nurse Practitioner

## 2017-10-02 DIAGNOSIS — D259 Leiomyoma of uterus, unspecified: Secondary | ICD-10-CM

## 2017-10-05 ENCOUNTER — Other Ambulatory Visit (HOSPITAL_COMMUNITY): Payer: Self-pay

## 2017-10-19 ENCOUNTER — Ambulatory Visit (HOSPITAL_COMMUNITY)
Admission: RE | Admit: 2017-10-19 | Discharge: 2017-10-19 | Disposition: A | Discharge status: Home / Self Care | Payer: Self-pay | Source: Ambulatory Visit | Attending: Oncology | Admitting: Oncology

## 2017-10-19 ENCOUNTER — Other Ambulatory Visit (HOSPITAL_COMMUNITY): Payer: Self-pay | Admitting: Oncology

## 2017-10-19 DIAGNOSIS — Z1231 Encounter for screening mammogram for malignant neoplasm of breast: Secondary | ICD-10-CM

## 2017-10-19 DIAGNOSIS — Z9012 Acquired absence of left breast and nipple: Secondary | ICD-10-CM | POA: Insufficient documentation

## 2017-10-21 ENCOUNTER — Other Ambulatory Visit (HOSPITAL_COMMUNITY): Payer: Self-pay | Admitting: Adult Health

## 2017-10-21 DIAGNOSIS — R232 Flushing: Secondary | ICD-10-CM

## 2017-10-21 DIAGNOSIS — Z171 Estrogen receptor negative status [ER-]: Secondary | ICD-10-CM

## 2017-10-21 DIAGNOSIS — C50112 Malignant neoplasm of central portion of left female breast: Secondary | ICD-10-CM

## 2017-10-24 ENCOUNTER — Ambulatory Visit (HOSPITAL_COMMUNITY)
Admission: RE | Admit: 2017-10-24 | Discharge: 2017-10-24 | Disposition: A | Discharge status: Home / Self Care | Payer: Self-pay | Source: Ambulatory Visit | Attending: Nurse Practitioner | Admitting: Nurse Practitioner

## 2017-10-24 ENCOUNTER — Encounter (HOSPITAL_COMMUNITY): Payer: Self-pay

## 2017-10-24 DIAGNOSIS — N859 Noninflammatory disorder of uterus, unspecified: Secondary | ICD-10-CM | POA: Insufficient documentation

## 2017-10-24 DIAGNOSIS — D259 Leiomyoma of uterus, unspecified: Secondary | ICD-10-CM

## 2017-10-24 DIAGNOSIS — N920 Excessive and frequent menstruation with regular cycle: Secondary | ICD-10-CM | POA: Insufficient documentation

## 2017-10-26 ENCOUNTER — Other Ambulatory Visit (HOSPITAL_COMMUNITY): Payer: Self-pay | Admitting: *Deleted

## 2017-10-26 DIAGNOSIS — Z171 Estrogen receptor negative status [ER-]: Secondary | ICD-10-CM

## 2017-10-26 DIAGNOSIS — C50112 Malignant neoplasm of central portion of left female breast: Secondary | ICD-10-CM

## 2017-10-27 ENCOUNTER — Inpatient Hospital Stay (HOSPITAL_COMMUNITY): Payer: Self-pay

## 2017-10-27 ENCOUNTER — Inpatient Hospital Stay (HOSPITAL_COMMUNITY): Payer: Self-pay | Attending: Internal Medicine | Admitting: Internal Medicine

## 2017-10-27 ENCOUNTER — Encounter (HOSPITAL_COMMUNITY): Payer: Self-pay | Admitting: Internal Medicine

## 2017-10-27 VITALS — BP 139/82 | HR 85 | Temp 98.5°F | Resp 16 | Wt 153.2 lb

## 2017-10-27 DIAGNOSIS — D509 Iron deficiency anemia, unspecified: Secondary | ICD-10-CM | POA: Insufficient documentation

## 2017-10-27 DIAGNOSIS — Z171 Estrogen receptor negative status [ER-]: Secondary | ICD-10-CM

## 2017-10-27 DIAGNOSIS — Z9012 Acquired absence of left breast and nipple: Secondary | ICD-10-CM | POA: Insufficient documentation

## 2017-10-27 DIAGNOSIS — C50112 Malignant neoplasm of central portion of left female breast: Secondary | ICD-10-CM

## 2017-10-27 DIAGNOSIS — Z853 Personal history of malignant neoplasm of breast: Secondary | ICD-10-CM | POA: Insufficient documentation

## 2017-10-27 DIAGNOSIS — R7989 Other specified abnormal findings of blood chemistry: Secondary | ICD-10-CM | POA: Insufficient documentation

## 2017-10-27 LAB — CBC WITH DIFFERENTIAL/PLATELET
Basophils Absolute: 0 10*3/uL (ref 0.0–0.1)
Basophils Relative: 0 %
Eosinophils Absolute: 0.1 10*3/uL (ref 0.0–0.7)
Eosinophils Relative: 2 %
HCT: 37.5 % (ref 36.0–46.0)
Hemoglobin: 11.4 g/dL — ABNORMAL LOW (ref 12.0–15.0)
Lymphocytes Relative: 25 %
Lymphs Abs: 1.7 10*3/uL (ref 0.7–4.0)
MCH: 22.4 pg — ABNORMAL LOW (ref 26.0–34.0)
MCHC: 30.4 g/dL (ref 30.0–36.0)
MCV: 73.8 fL — ABNORMAL LOW (ref 78.0–100.0)
Monocytes Absolute: 0.5 10*3/uL (ref 0.1–1.0)
Monocytes Relative: 7 %
Neutro Abs: 4.4 10*3/uL (ref 1.7–7.7)
Neutrophils Relative %: 66 %
Platelets: 373 10*3/uL (ref 150–400)
RBC: 5.08 MIL/uL (ref 3.87–5.11)
RDW: 26.8 % — ABNORMAL HIGH (ref 11.5–15.5)
WBC: 6.7 10*3/uL (ref 4.0–10.5)

## 2017-10-27 LAB — COMPREHENSIVE METABOLIC PANEL
ALT: 100 U/L — ABNORMAL HIGH (ref 14–54)
AST: 65 U/L — ABNORMAL HIGH (ref 15–41)
Albumin: 4.3 g/dL (ref 3.5–5.0)
Alkaline Phosphatase: 119 U/L (ref 38–126)
Anion gap: 11 (ref 5–15)
BUN: 9 mg/dL (ref 6–20)
CO2: 24 mmol/L (ref 22–32)
Calcium: 9.5 mg/dL (ref 8.9–10.3)
Chloride: 103 mmol/L (ref 101–111)
Creatinine, Ser: 0.58 mg/dL (ref 0.44–1.00)
GFR calc Af Amer: >60 mL/min (ref >60)
GFR calc non Af Amer: >60 mL/min (ref >60)
Glucose, Bld: 115 mg/dL — ABNORMAL HIGH (ref 65–99)
Potassium: 3.8 mmol/L (ref 3.5–5.1)
Sodium: 138 mmol/L (ref 135–145)
Total Bilirubin: 0.7 mg/dL (ref 0.3–1.2)
Total Protein: 7.9 g/dL (ref 6.5–8.1)

## 2017-10-27 NOTE — Patient Instructions (Addendum)
[place] at [facility] Discharge Instructions  You were seen today by Dr.Higgs. She went over your recent scan and lab work. Your liver tests were slightly elevated but that could be due to your fatty liver. You had a CT scan this time last year that showed your fatty liver. Try using a mild mouth wash to help with the white spots that you have noticed. Return in 6 months for labs and follow up.     Thank you for choosing [place] at [facility] to provide your oncology and hematology care.  To afford each patient quality time with our provider, please arrive at least 15 minutes before your scheduled appointment time.   If you have a lab appointment with the [facility] please come in thru the  Main Entrance and check in at the main information desk  You need to re-schedule your appointment should you arrive 10 or more minutes late.  We strive to give you quality time with our providers, and arriving late affects you and other patients whose appointments are after yours.  Also, if you no show three or more times for appointments you may be dismissed from the clinic at the providers discretion.     Again, thank you for choosing [facility].  Our hope is that these requests will decrease the amount of time that you wait before being seen by our physicians.       _____________________________________________________________  Should you have questions after your visit to [facility], please contact our office at [phone] between the hours of 8:30 a.m. and 4:30 p.m.  Voicemails left after 4:30 p.m. will not be returned until the following business day.  For prescription refill requests, have your pharmacy contact our office.       Resources For Cancer Patients and their Caregivers ? American Cancer Society: Can assist with transportation, wigs, general needs, runs Look Good Feel Better.         ([phone] 565-6160 ? Cancer Care: Provides financial assistance, online support groups, medication/co-pay assistance.  1-800-813-HOPE 208 667 9667) ? [place] Assists [place] cancer patients and their families through emotional , educational and financial support.  418-450-4235 ? [place] DSS Where to apply for food stamps, Medicaid and utility assistance. 534-409-8193 ? RCATS: Transportation to medical appointments. 6104497114 ? Social Security Administration: May apply for disability if have a Stage IV cancer. 501-015-0111 (289) 673-8330 ? [employer], Disability and Transit Services: Assists with nutrition, care and transit needs. [place] Support Programs:   > Cancer Support Group  2nd Tuesday of the month 1pm-2pm, Journey Room   > Creative Journey  3rd Tuesday of the month 1130am-1pm, Journey Room

## 2017-10-27 NOTE — Progress Notes (Signed)
Diagnosis Iron deficiency anemia, unspecified iron deficiency anemia type - Plan: CBC with Differential/Platelet, Comprehensive metabolic panel, Ferritin, Lactate dehydrogenase, Hemoglobinopathy evaluation  Malignant neoplasm of central portion of left breast in female, estrogen receptor negative ([place]) - Plan: CBC with Differential/Platelet, Comprehensive metabolic panel, Ferritin, Lactate dehydrogenase, Hemoglobinopathy evaluation  Staging Cancer Staging Cancer of central portion of left female breast [facility]) Staging form: Breast, AJCC 7th Edition - Pathologic stage from 12/11/2015: Stage IIB (T2, N1a, cM0) - Signed by Baird Cancer, PA-C on 12/31/2015  ASSESSMENT/PLAN: 1.  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  37 yr old pt previously followed by Dr. Talbert Cage.    She had a mammogram performed 09/16/2015 that showed a 1.6 cm retroareolar left breast mass measuring 1.6 cm with 3 abnormal left axillary lymph nodes noted.  She underwent biopsy of the left breast mass as well as the left axillary lymph nodes on 09/23/2015.  Pathology returned as invasive ductal carcinoma high-grade left axillary lymph node was positive for metastatic cancer.  Her tumor was ER, PR negative HER-2 negative.  Ki-67 was 85%.  She underwent staging workup with a bone scan on 10/26/2015 that was negative.  CT chest, abdomen, pelvis was done 10/27/2015 that showed the left breast mass and left axillary lymph nodes with no other evidence of metastatic disease.  She underwent left mastectomy and axillary lymph node dissection with Dr. Arnoldo Morale on 12/11/2015 with pathology returning as invasive ductal grade 3 measuring 2.3 cm.  2/10 lymph nodes were positive for malignancy.  She was triple negative.  She was treated with 4 cycles of AC followed by 12 weeks of Taxol which was completed in October 2017.  She was also treated with radiation 33 fractions which was completed in December 2017.  Patient is here today for follow-up to go  over her recent right screening mammogram done 10/19/2017 that was negative for malignancy.  She is recommended for repeat right screening mammogram in March 2020.  She will return to clinic in 6 months for follow-up.  2.  Abnormal liver function tests.  She has CT of the abdomen and pelvis done on 10/05/2016 that showed a fatty liver.  She had previous liver biopsy done 10/24/2016 with pathology consistent with fatty liver.  She will have repeat labs on return to clinic.  3.  Mouth discomfort.  When questioned she indicated she had been biting the inside of her jaw.  No significant abnormalities were noted.  She is encouraged to follow-up with her dentist.  She is also recommended for mild mouthwash if symptoms persist.  4.  Pelvic discomfort.  Pelvic ultrasound done on 10/24/2017 that was negative.  She should continue to follow with GYN as recommended.  5.  Iron deficiency anemia.  Hemoglobin 11.4 on labs done 10/27/2017.  She has evidence of microcytosis so will check ferritin, hemoglobin electrophoresis on return to clinic.  Interval History  20 old female previously followed by Dr. Talbert Cage.    She had a mammogram performed 09/16/2015 that showed a 1.6 cm retroareolar left breast mass measuring 1.6 cm with 3 abnormal left axillary lymph nodes noted.  She underwent biopsy of the left breast mass as well as the left axillary lymph nodes on 09/23/2015.  Pathology returned as invasive ductal carcinoma high-grade left axillary lymph node was positive for metastatic cancer.  Her tumor was ER, PR negative HER-2 negative.  Ki-67 was 85%.  She underwent staging workup with a bone scan on 10/26/2015 that was negative.  CT chest, abdomen, pelvis was done 10/27/2015 that showed the left breast mass and left axillary lymph nodes with no other evidence of metastatic disease.  She underwent left mastectomy and axillary lymph node dissection with Dr. Arnoldo Morale on 12/11/2015 with pathology returning as invasive ductal grade 3  measuring 2.3 cm.  2/10 lymph nodes were positive for malignancy.  She was triple negative.  She was treated with 4 cycles of AC followed by 12 weeks of Taxol which was completed in October 2017.  She was also treated with radiation 33 fractions which was completed in December 2017.  Current Status: 37 year old female seen today for follow-up.  She is here today to go over her mammogram.  She reports some mouth discomfort.  She reports recent Pelvic ultrasound was done.  Interpreter was present during the visit.    Cancer of central portion of left female breast ([place])   09/16/2015 Mammogram    1.6 cm retroareolar left breast mass measuring 1.6 cm in largest diameter in addition to 3 abnormal appearing left inferior axillary lymph nodes.      09/23/2015 Initial Biopsy    US guided core biopsy of left breast mass and suspicious left axillary lymph nodes.      09/23/2015 Pathology Results    Invasive ductal carcinoma, high grade.  Left axilla lymph node is positive for metastatic carcinoma.  ER/PR NEGATIVE, HER2 NEGATIVE, KI-67 marker of 85%.      10/26/2015 Imaging    Bone scan- No evidence of metastatic disease to the skeleton.      10/27/2015 Imaging    CT CAP-  Retroareolar mass with 2 asymmetrically prominent left axillary lymph nodes and an upper normal size left subpectoral lymph node. No other findings of metastatic disease in the chest, abdomen, or pelvis.      11/05/2015 Echocardiogram    The estimated ejection fraction was in the range of 60% to 65%.      11/23/2015 Miscellaneous    Genetic Counseling- genetic test result was negative for any known pathogenic mutations within any of 28 genes that would cause her to be at an increased genetic risk for breast, ovarian, or other related cancers.      12/09/2015 Surgery    L radical mastectomy, axillary LN dissection, port a cath insertion with Dr. Aviva Signs 12/09/2015      12/11/2015 Pathology Results    - INVASIVE GRADE III DUCTAL  CARCINOMA, SPANNING 2.3 CM IN GREATEST DIMENSION. - ASSOCIATED HIGH GRADE DCIS - MARGINS ARE NEGATIVE. 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 - EIGHT ADDITIONAL LYMPH NODES WITH NO TUMOR SEEN      12/11/2015 Pathology Results    HER2 NEGATIVE, ER 0%, PR 0%, Ki-67 marker 90%      12/25/2015 - 02/12/2016 Chemotherapy    AC every 2 weeks x 4 cycles      02/04/2016 Treatment Plan Change    Treatment deferred x 7 days due to neutropenia      02/25/2016 - 05/12/2016 Chemotherapy    Paclitaxel weekly x 12      06/01/2016 - 07/19/2016 Radiation Therapy    33 fractions, Dr. Lisbeth Renshaw in [place]      02/06/2017 Surgery    (L) breast reconstruction (lat flap with tissue expander) with Dr. Iran Planas        Problem List Patient Active Problem List   Diagnosis Date Noted  . 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 Transaminitis [R74.0] 09/28/2016  .  Genetic testing [Z13.79] 12/20/2015  . S/P mastectomy [Z90.10] 12/09/2015  . Family history of uterine cancer [Z80.49] 10/29/2015  . Iron deficiency anemia [D50.9] 10/27/2015  . Cancer of central portion of left female breast ([place]) [C50.112] 10/22/2015  . Cephalalgia [R51] 08/26/2015    Past Medical History Past Medical History:  Diagnosis Date  . History of breast cancer 10/2015   left  . History of chemotherapy 2017  . PONV (postoperative nausea and vomiting)     Past Surgical History Past Surgical History:  Procedure Laterality Date  . BREAST ENHANCEMENT SURGERY Right 05/19/2017   Procedure: Augmentation Right Breast with Saline Implant for symmetry;  Surgeon: Irene Limbo, MD;  Location: [place];  Service: Plastics;  Laterality: Right;  . LATISSIMUS FLAP TO BREAST Left 02/06/2017   Procedure: LEFT LATISSIMUS FLAP TO BREAST;  Surgeon: Irene Limbo, MD;  Location: [place];  Service: Plastics;  Laterality: Left;  Marland Kitchen MASTECTOMY MODIFIED RADICAL Left 12/09/2015   Procedure:  LEFT MODIFIED RADICAL MASTECTOMY;  Surgeon: Aviva Signs, MD;  Location: [facility];  Service: General;  Laterality: Left;  . PORT-A-CATH REMOVAL Right 05/19/2017   Procedure: REMOVAL PORT-A-CATH;  Surgeon: Irene Limbo, MD;  Location: [place];  Service: Plastics;  Laterality: Right;  . PORTACATH PLACEMENT Right 12/09/2015   Procedure: INSERTION PORT-A-CATH RIGHT SUBCLAVIAN;  Surgeon: Aviva Signs, MD;  Location: [facility];  Service: General;  Laterality: Right;  . REMOVAL OF TISSUE EXPANDER AND PLACEMENT OF IMPLANT Left 05/19/2017   Procedure: REMOVAL OF LEFT TISSUE EXPANDER AND PLACEMENT OF SALINE IMPLANT;  Surgeon: Irene Limbo, MD;  Location: [place];  Service: Plastics;  Laterality: Left;  . TISSUE EXPANDER PLACEMENT Left 02/06/2017   Procedure: TISSUE EXPANDER PLACEMENT LEFT BREAST;  Surgeon: Irene Limbo, MD;  Location: [place];  Service: Plastics;  Laterality: Left;    Family History Family History  Problem Relation Age of Onset  . Diabetes Mother   . Uterine cancer Paternal Aunt 31  . Heart attack Paternal Uncle   . Heart attack Paternal Grandfather 69     Social History  reports that she has never smoked. She has never used smokeless tobacco. She reports that she does not drink alcohol or use drugs.  Medications  Current Outpatient Medications:  .  gabapentin (NEURONTIN) 300 MG capsule, TAKE 1 CAPSULE BY MOUTH AT BEDTIME, Disp: 30 capsule, Rfl: 3  Allergies Patient has no active allergies.  Review of Systems Review of Systems - Oncology ROS as per HPI otherwise 12 point ROS is negative.   Physical Exam  Vitals Wt Readings from Last 3 Encounters:  10/27/17 153 lb 3.2 oz (69.5 kg)  06/23/17 153 lb (69.4 kg)  05/19/17 154 lb (69.9 kg)   Temp Readings from Last 3 Encounters:  10/27/17 98.5 F (36.9 C) (Oral)  06/23/17 98.1 F (36.7 C) (Oral)  05/19/17 98.4 F (36.9 C)   BP Readings from Last 3 Encounters:  10/27/17  139/82  06/23/17 (!) 142/85  05/19/17 110/65   Pulse Readings from Last 3 Encounters:  10/27/17 85  06/23/17 89  05/19/17 73   Constitutional: Well-developed, well-nourished, and in no distress.   HENT: Head: Normocephalic and atraumatic.  Mouth/Throat: No oropharyngeal exudate. Mucosa moist. Eyes: Pupils are equal, round, and reactive to light. Conjunctivae are normal. No scleral icterus.  Neck: Normal range of motion. Neck supple. No JVD present.  Cardiovascular: Normal rate, regular rhythm and normal heart sounds.  Exam reveals no gallop and no  friction rub.   No murmur heard. Pulmonary/Chest: Effort normal and breath sounds normal. No respiratory distress. No wheezes.No rales.  Abdominal: Soft. Bowel sounds are normal. No distension. There is no tenderness. There is no guarding.  Musculoskeletal: No edema or tenderness.  Lymphadenopathy: No cervical, axillary or supraclavicular adenopathy.  Neurological: Alert and oriented to person, place, and time. No cranial nerve deficit.  Skin: Skin is warm and dry. No rash noted. No erythema. No pallor.  Psychiatric: Affect and judgment normal.  Bilateral breast exam: Chaperone present.  Left mastectomy with reconstruction.  Right breast shows no dominant masses implants.  Labs Appointment on 10/27/2017  Component Date Value Ref Range Status  . WBC 10/27/2017 6.7  4.0 - 10.5 K/uL Final  . RBC 10/27/2017 5.08  3.87 - 5.11 MIL/uL Final  . Hemoglobin 10/27/2017 11.4* 12.0 - 15.0 g/dL Final  . HCT 10/27/2017 37.5  36.0 - 46.0 % Final  . MCV 10/27/2017 73.8* 78.0 - 100.0 fL Final  . MCH 10/27/2017 22.4* 26.0 - 34.0 pg Final  . MCHC 10/27/2017 30.4  30.0 - 36.0 g/dL Final  . RDW 10/27/2017 26.8* 11.5 - 15.5 % Final  . Platelets 10/27/2017 373  150 - 400 K/uL Final  . Neutrophils Relative % 10/27/2017 66  % Final  . Lymphocytes Relative 10/27/2017 25  % Final  . Monocytes Relative 10/27/2017 7  % Final  . Eosinophils Relative 10/27/2017 2   % Final  . Basophils Relative 10/27/2017 0  % Final  . Neutro Abs 10/27/2017 4.4  1.7 - 7.7 K/uL Final  . Lymphs Abs 10/27/2017 1.7  0.7 - 4.0 K/uL Final  . Monocytes Absolute 10/27/2017 0.5  0.1 - 1.0 K/uL Final  . Eosinophils Absolute 10/27/2017 0.1  0.0 - 0.7 K/uL Final  . Basophils Absolute 10/27/2017 0.0  0.0 - 0.1 K/uL Final  . RBC Morphology 10/27/2017 POLYCHROMASIA PRESENT   Final   Comment: ANISOCYTES Performed at [facility], [address], [place], [place] [address]   . Sodium 10/27/2017 138  135 - 145 mmol/L Final  . Potassium 10/27/2017 3.8  3.5 - 5.1 mmol/L Final  . Chloride 10/27/2017 103  101 - 111 mmol/L Final  . CO2 10/27/2017 24  22 - 32 mmol/L Final  . Glucose, Bld 10/27/2017 115* 65 - 99 mg/dL Final  . BUN 10/27/2017 9  6 - 20 mg/dL Final  . Creatinine, Ser 10/27/2017 0.58  0.44 - 1.00 mg/dL Final  . Calcium 10/27/2017 9.5  8.9 - 10.3 mg/dL Final  . Total Protein 10/27/2017 7.9  6.5 - 8.1 g/dL Final  . Albumin 10/27/2017 4.3  3.5 - 5.0 g/dL Final  . AST 10/27/2017 65* 15 - 41 U/L Final  . ALT 10/27/2017 100* 14 - 54 U/L Final  . Alkaline Phosphatase 10/27/2017 119  38 - 126 U/L Final  . Total Bilirubin 10/27/2017 0.7  0.3 - 1.2 mg/dL Final  . GFR calc non Af Amer 10/27/2017 >60  >60 mL/min Final  . GFR calc Af Amer 10/27/2017 >60  >60 mL/min Final   Comment: (NOTE) The eGFR has been calculated using the CKD EPI equation. This calculation has not been validated in all clinical situations. eGFR's persistently <60 mL/min signify possible Chronic Kidney Disease.   Georgiann Hahn gap 10/27/2017 11  5 - 15 Final   Performed at [facility], [address], [place],  [address]     Pathology:     Pathology from [facility]:  Biopsy of left  breast mass at the 12:00 position demonstrates invasive ductal carcinoma, high-grade.  Left axillary lymph node biopsy demonstrates invasive ductal carcinoma, high-grade, consistent with metastasis to lymph node.  Breast prognostic panel is performed demonstrating ER/PR negativity, HER-2/neu negativity, and a KI-67 Marker of 85%.   Orders Placed This Encounter  Procedures  . CBC with Differential/Platelet    Standing Status:   Future    Standing Expiration Date:   10/28/2018  . Comprehensive metabolic panel    Standing Status:   Future    Standing Expiration Date:   10/28/2018  . Ferritin    Standing Status:   Future    Standing Expiration Date:   10/28/2018  . Lactate dehydrogenase    Standing Status:   Future    Standing Expiration Date:   10/28/2018  . Hemoglobinopathy evaluation    Standing Status:   Future    Standing Expiration Date:   10/28/2018       Zoila Shutter MD

## 2017-10-30 ENCOUNTER — Ambulatory Visit: Payer: Self-pay | Admitting: Physician Assistant

## 2017-10-30 ENCOUNTER — Encounter: Payer: Self-pay | Admitting: Physician Assistant

## 2017-10-30 VITALS — BP 124/77 | HR 79 | Temp 97.9°F | Ht 62.0 in | Wt 154.5 lb

## 2017-10-30 DIAGNOSIS — D509 Iron deficiency anemia, unspecified: Secondary | ICD-10-CM

## 2017-10-30 DIAGNOSIS — Z171 Estrogen receptor negative status [ER-]: Secondary | ICD-10-CM

## 2017-10-30 DIAGNOSIS — C50112 Malignant neoplasm of central portion of left female breast: Secondary | ICD-10-CM

## 2017-10-30 DIAGNOSIS — K7581 Nonalcoholic steatohepatitis (NASH): Secondary | ICD-10-CM

## 2017-10-30 DIAGNOSIS — Z7689 Persons encountering health services in other specified circumstances: Secondary | ICD-10-CM

## 2017-10-30 DIAGNOSIS — Z9010 Acquired absence of unspecified breast and nipple: Secondary | ICD-10-CM

## 2017-10-30 NOTE — Progress Notes (Signed)
BP 124/77 (BP Location: Right Arm, Patient Position: Sitting, Cuff Size: Normal)   Pulse 79   Temp 97.9 F (36.6 C)   Ht 5' 2"  (1.575 m)   Wt 154 lb 8 oz (70.1 kg)   SpO2 97%   BMI 28.26 kg/m    Subjective:    Patient ID: Michele Salas, female    DOB: [birth year], 37 y.o.   MRN: [id number]  HPI: Michele Salas is a 37 y.o. female presenting on 10/30/2017 for New Patient (Initial Visit) (returning patient)   HPI   Chief Complaint  Patient presents with  . New Patient (Initial Visit)    returning patient    Pt not seen here since 10/21/2015.  She has been treated for breast cancer with mastectomy and reconstruction.   She has also had chemotherapy and radiation therapy.   Pt underwent liver biopsy march 2018 which revealed NASH  Her hem/onc specialists are following her anemia  She has follow up with them in September  Pt says she had PAP at [facility] earlier this year.  She isn't going there routinely.   Pt has no complaints today  Relevant past medical, surgical, family and social history reviewed and updated as indicated. Interim medical history since our last visit reviewed. Allergies and medications reviewed and updated.   Current Outpatient Medications:  .  ferrous sulfate 325 (65 FE) MG EC tablet, Take 325 mg by mouth daily., Disp: , Rfl:  .  gabapentin (NEURONTIN) 300 MG capsule, TAKE 1 CAPSULE BY MOUTH AT BEDTIME, Disp: 30 capsule, Rfl: 3   Review of Systems  Constitutional: Positive for diaphoresis and fatigue. Negative for appetite change, chills, fever and unexpected weight change.  HENT: Negative for congestion, dental problem, drooling, ear pain, facial swelling, hearing loss, mouth sores, sneezing, sore throat, trouble swallowing and voice change.   Eyes: Positive for itching. Negative for pain, discharge, redness and visual disturbance.  Respiratory: Negative for cough, choking, shortness of breath and wheezing.   Cardiovascular: Negative for chest  pain, palpitations and leg swelling.  Gastrointestinal: Positive for constipation. Negative for abdominal pain, blood in stool, diarrhea and vomiting.  Endocrine: Negative for cold intolerance, heat intolerance and polydipsia.  Genitourinary: Negative for decreased urine volume, dysuria and hematuria.  Musculoskeletal: Positive for arthralgias and back pain. Negative for gait problem.  Skin: Negative for rash.  Allergic/Immunologic: Positive for environmental allergies.  Neurological: Negative for seizures, syncope, light-headedness and headaches.  Hematological: Negative for adenopathy.  Psychiatric/Behavioral: Negative for agitation, dysphoric mood and suicidal ideas. The patient is not nervous/anxious.     Per HPI unless specifically indicated above     Objective:    BP 124/77 (BP Location: Right Arm, Patient Position: Sitting, Cuff Size: Normal)   Pulse 79   Temp 97.9 F (36.6 C)   Ht 5' 2"  (1.575 m)   Wt 154 lb 8 oz (70.1 kg)   SpO2 97%   BMI 28.26 kg/m   Wt Readings from Last 3 Encounters:  10/30/17 154 lb 8 oz (70.1 kg)  10/27/17 153 lb 3.2 oz (69.5 kg)  06/23/17 153 lb (69.4 kg)    Physical Exam  Constitutional: She is oriented to person, place, and time. She appears well-developed and well-nourished.  HENT:  Head: Normocephalic and atraumatic.  Mouth/Throat: Oropharynx is clear and moist. No oropharyngeal exudate.  Eyes: Pupils are equal, round, and reactive to light. Conjunctivae and EOM are normal.  Neck: Neck supple. No thyromegaly present.  Cardiovascular: Normal rate and regular  rhythm.  Pulmonary/Chest: Effort normal and breath sounds normal.  Abdominal: Soft. Bowel sounds are normal. She exhibits no mass. There is no hepatosplenomegaly. There is no tenderness.  Musculoskeletal: She exhibits no edema.  Lymphadenopathy:    She has no cervical adenopathy.  Neurological: She is alert and oriented to person, place, and time. Gait normal.  Skin: Skin is warm and  dry.  Psychiatric: She has a normal mood and affect. Her behavior is normal.  Vitals reviewed.       Assessment & Plan:   Encounter Diagnoses  Name Primary?  . Encounter to establish care Yes  . NASH (nonalcoholic steatohepatitis)   . Malignant neoplasm of central portion of left breast in female, estrogen receptor negative ([place])   . Status post mastectomy, unspecified laterality   . Iron deficiency anemia, unspecified iron deficiency anemia type      -pt has follow up with hem/onc -pt's labs are UTD -record request sent to [facility] for most recent PAP -pt to follow up here 6 months.  RTO sooner prn

## 2017-11-23 ENCOUNTER — Encounter: Payer: Self-pay | Admitting: Physician Assistant

## 2017-12-13 NOTE — H&P (Deleted)
Subjective:     Patient ID: Michele Salas is a 37 y.o. female.  Follow-up   2 years post op implant exchange. Plan for revision reconstruction. Surgery date changed as family friend passed.  Presented with a screening MMG and found to have calcifications in the right breast measuring 9 mm and biopsy showed high grade DCIS, ER/PR +. Patient elected for bilateral NSM with expander placement. Final pathology Right DCIS present focally less than 0.1 cm to inked edge of non oriented tissue present with mastectomy specimen, SLN negative. Left breast benign.  Genetics negative.  Quit smoking prior to mastectomy surgery- she resumed this and states today she is still trying to quit.  Prior D/DD cup, desires smaller. Mastectomy weight right 689 g left 488 g.   Review of Systems    Objective:   Physical Exam  Cardiovascular: Normal rate, regular rhythm and normal heart sounds.   Pulmonary/Chest: Effort normal and breath sounds normal.  Abdominal: Soft.  Lymphadenopathy:    She has no axillary adenopathy.   Very tan No masses Chest: SN to nipple R 24.5 L 24, left peri areolar scar from 9 to 12 to 3 o clock, left NAC wider than right right NAC diameter 40 mm left 48 mm in craniocaudal direction Nipple to IMF R 9 L 9 cm  Bilateral animation, lateral displacement bilateral in supine position greater on right  Small depression right over area of caudal muscle unchanged  Assessment:     DCIS Right UOQ  S/p NSM, TE/ADM reconstruction S/p placement silicone implants, lipofilling, removal non incorporated ADM, left periareolar mastopexy S/p tattoo right NAC    Plan:  Plan bilateral capsulorraphy and ADM for support. Discussed OP surgery, drain for this. She would like to exchange implants to smooth. She is overall satisfied with volume- counseled any larger size would come with wider base implant and she does not desire this. She is scared of drains- reviewed this would likely be  for a week, would recommend ADM as part of plan for support as she has experienced lateral displacement even in setting textured device.  With regards to smoking, counseled she needs to be off all nicotine products as of today. If she cannot do this she should delay surgery. Reviewed risk wound healing problems, extrusion implants with active smoking not worth risk. She states she will do this.  Rx for oxycodone and Bactrim given.   120 ml fat infiltrated over right reconstruction, 60 ml to left chest. Natrelle Inspira Biocell Round Cohesive Extra projection gel implants 560 ml placed bilateral.       Irene Limbo, MD [facility] [phone], pin [phone]

## 2017-12-15 ENCOUNTER — Other Ambulatory Visit: Payer: Self-pay

## 2017-12-15 ENCOUNTER — Encounter (HOSPITAL_BASED_OUTPATIENT_CLINIC_OR_DEPARTMENT_OTHER): Payer: Self-pay | Admitting: *Deleted

## 2017-12-15 NOTE — Pre-Procedure Instructions (Signed)
To come pick up Ensure pre-surgery drink 10 oz. - to drink by 0845 DOS.

## 2017-12-19 NOTE — Progress Notes (Signed)
Ensure pre surgery drink given with instructions to complete by [facility], pt verbalized understanding.

## 2017-12-21 NOTE — Pre-Procedure Instructions (Signed)
Danae Chen will be interpreter for pt., per Bethena Roys at [place] for [facility]; please call [phone] if surgery time changes.

## 2017-12-22 ENCOUNTER — Ambulatory Visit (HOSPITAL_BASED_OUTPATIENT_CLINIC_OR_DEPARTMENT_OTHER): Payer: Self-pay | Admitting: Anesthesiology

## 2017-12-22 ENCOUNTER — Encounter (HOSPITAL_BASED_OUTPATIENT_CLINIC_OR_DEPARTMENT_OTHER): Payer: Self-pay | Admitting: Anesthesiology

## 2017-12-22 ENCOUNTER — Encounter (HOSPITAL_BASED_OUTPATIENT_CLINIC_OR_DEPARTMENT_OTHER)
Admission: RE | Disposition: A | Discharge status: Home / Self Care | Payer: Self-pay | Source: Ambulatory Visit | Attending: Plastic Surgery

## 2017-12-22 ENCOUNTER — Other Ambulatory Visit: Payer: Self-pay

## 2017-12-22 ENCOUNTER — Ambulatory Visit (HOSPITAL_BASED_OUTPATIENT_CLINIC_OR_DEPARTMENT_OTHER)
Admission: RE | Admit: 2017-12-22 | Discharge: 2017-12-22 | Disposition: A | Discharge status: Home / Self Care | Payer: Self-pay | Source: Ambulatory Visit | Attending: Plastic Surgery | Admitting: Plastic Surgery

## 2017-12-22 DIAGNOSIS — N6489 Other specified disorders of breast: Secondary | ICD-10-CM | POA: Insufficient documentation

## 2017-12-22 DIAGNOSIS — Z853 Personal history of malignant neoplasm of breast: Secondary | ICD-10-CM | POA: Insufficient documentation

## 2017-12-22 DIAGNOSIS — Z9012 Acquired absence of left breast and nipple: Secondary | ICD-10-CM | POA: Insufficient documentation

## 2017-12-22 DIAGNOSIS — Z923 Personal history of irradiation: Secondary | ICD-10-CM | POA: Insufficient documentation

## 2017-12-22 DIAGNOSIS — Z9221 Personal history of antineoplastic chemotherapy: Secondary | ICD-10-CM | POA: Insufficient documentation

## 2017-12-22 DIAGNOSIS — Z421 Encounter for breast reconstruction following mastectomy: Secondary | ICD-10-CM | POA: Insufficient documentation

## 2017-12-22 HISTORY — PX: SKIN FULL THICKNESS GRAFT: SHX442

## 2017-12-22 HISTORY — DX: Personal history of irradiation: Z92.3

## 2017-12-22 HISTORY — PX: MASTOPEXY: SHX5358

## 2017-12-22 SURGERY — MASTOPEXY
Anesthesia: General | Site: Groin | Laterality: Right

## 2017-12-22 MED ORDER — ACETAMINOPHEN 500 MG PO TABS
1000.0000 mg | ORAL_TABLET | ORAL | Status: AC
  Administered 2017-12-22: 1000 mg via ORAL

## 2017-12-22 MED ORDER — ONDANSETRON HCL 4 MG/2ML IJ SOLN
INTRAMUSCULAR | Status: DC | PRN
  Administered 2017-12-22: 4 mg via INTRAVENOUS

## 2017-12-22 MED ORDER — SUCCINYLCHOLINE CHLORIDE 200 MG/10ML IV SOSY
PREFILLED_SYRINGE | INTRAVENOUS | Status: AC
  Filled 2017-12-22: qty 10

## 2017-12-22 MED ORDER — OXYCODONE HCL 5 MG PO TABS
5.0000 mg | ORAL_TABLET | Freq: Once | ORAL | Status: DC | PRN
Start: 2017-12-22 — End: 2017-12-22

## 2017-12-22 MED ORDER — MIDAZOLAM HCL 5 MG/5ML IJ SOLN
INTRAMUSCULAR | Status: DC | PRN
  Administered 2017-12-22: 2 mg via INTRAVENOUS

## 2017-12-22 MED ORDER — MIDAZOLAM HCL 2 MG/2ML IJ SOLN
INTRAMUSCULAR | Status: AC
  Filled 2017-12-22: qty 2

## 2017-12-22 MED ORDER — PROMETHAZINE HCL 25 MG/ML IJ SOLN
6.2500 mg | Freq: Once | INTRAMUSCULAR | Status: AC
  Administered 2017-12-22: 6.25 mg via INTRAVENOUS

## 2017-12-22 MED ORDER — ONDANSETRON HCL 4 MG/2ML IJ SOLN
INTRAMUSCULAR | Status: AC
  Filled 2017-12-22: qty 2

## 2017-12-22 MED ORDER — EPHEDRINE SULFATE 50 MG/ML IJ SOLN
INTRAMUSCULAR | Status: DC | PRN
  Administered 2017-12-22 (×3): 10 mg via INTRAVENOUS

## 2017-12-22 MED ORDER — ACETAMINOPHEN 500 MG PO TABS
ORAL_TABLET | ORAL | Status: AC
  Filled 2017-12-22: qty 2

## 2017-12-22 MED ORDER — FENTANYL CITRATE (PF) 100 MCG/2ML IJ SOLN
INTRAMUSCULAR | Status: DC | PRN
  Administered 2017-12-22 (×2): 100 ug via INTRAVENOUS
  Administered 2017-12-22 (×2): 25 ug via INTRAVENOUS

## 2017-12-22 MED ORDER — CELECOXIB 200 MG PO CAPS
ORAL_CAPSULE | ORAL | Status: AC
  Filled 2017-12-22: qty 1

## 2017-12-22 MED ORDER — ROCURONIUM BROMIDE 100 MG/10ML IV SOLN
INTRAVENOUS | Status: DC | PRN
  Administered 2017-12-22: 40 mg via INTRAVENOUS

## 2017-12-22 MED ORDER — BUPIVACAINE-EPINEPHRINE (PF) 0.25% -1:200000 IJ SOLN
INTRAMUSCULAR | Status: AC
  Filled 2017-12-22: qty 30

## 2017-12-22 MED ORDER — PROPOFOL 10 MG/ML IV BOLUS
INTRAVENOUS | Status: DC | PRN
  Administered 2017-12-22: 20 mg via INTRAVENOUS
  Administered 2017-12-22: 160 mg via INTRAVENOUS

## 2017-12-22 MED ORDER — HYDROMORPHONE HCL 1 MG/ML IJ SOLN
INTRAMUSCULAR | Status: AC
  Filled 2017-12-22: qty 0.5

## 2017-12-22 MED ORDER — FENTANYL CITRATE (PF) 100 MCG/2ML IJ SOLN: 50.0000 ug | INTRAMUSCULAR | Status: DC | PRN

## 2017-12-22 MED ORDER — BACITRACIN ZINC 500 UNIT/GM EX OINT
TOPICAL_OINTMENT | CUTANEOUS | Status: DC | PRN
  Administered 2017-12-22: 1 via TOPICAL

## 2017-12-22 MED ORDER — CEFAZOLIN SODIUM-DEXTROSE 2-4 GM/100ML-% IV SOLN
2.0000 g | INTRAVENOUS | Status: AC
  Administered 2017-12-22: 2 g via INTRAVENOUS

## 2017-12-22 MED ORDER — SUGAMMADEX SODIUM 200 MG/2ML IV SOLN
INTRAVENOUS | Status: DC | PRN
  Administered 2017-12-22: 200 mg via INTRAVENOUS

## 2017-12-22 MED ORDER — LIDOCAINE HCL (CARDIAC) PF 100 MG/5ML IV SOSY
PREFILLED_SYRINGE | INTRAVENOUS | Status: AC
  Filled 2017-12-22: qty 5

## 2017-12-22 MED ORDER — FENTANYL CITRATE (PF) 100 MCG/2ML IJ SOLN
INTRAMUSCULAR | Status: AC
  Filled 2017-12-22: qty 2

## 2017-12-22 MED ORDER — PROMETHAZINE HCL 25 MG/ML IJ SOLN
INTRAMUSCULAR | Status: AC
  Filled 2017-12-22: qty 1

## 2017-12-22 MED ORDER — MIDAZOLAM HCL 2 MG/2ML IJ SOLN: 1.0000 mg | INTRAMUSCULAR | Status: DC | PRN

## 2017-12-22 MED ORDER — CEFAZOLIN SODIUM-DEXTROSE 2-4 GM/100ML-% IV SOLN
INTRAVENOUS | Status: AC
  Filled 2017-12-22: qty 100

## 2017-12-22 MED ORDER — PROMETHAZINE HCL 25 MG/ML IJ SOLN: 6.2500 mg | INTRAMUSCULAR | Status: DC | PRN

## 2017-12-22 MED ORDER — DEXAMETHASONE SODIUM PHOSPHATE 10 MG/ML IJ SOLN
INTRAMUSCULAR | Status: AC
  Filled 2017-12-22: qty 1

## 2017-12-22 MED ORDER — BUPIVACAINE-EPINEPHRINE 0.25% -1:200000 IJ SOLN
INTRAMUSCULAR | Status: DC | PRN
  Administered 2017-12-22: 19 mL

## 2017-12-22 MED ORDER — SCOPOLAMINE 1 MG/3DAYS TD PT72
MEDICATED_PATCH | TRANSDERMAL | Status: AC
  Filled 2017-12-22: qty 1

## 2017-12-22 MED ORDER — LACTATED RINGERS IV SOLN
INTRAVENOUS | Status: DC
  Administered 2017-12-22 (×4): via INTRAVENOUS

## 2017-12-22 MED ORDER — SCOPOLAMINE 1 MG/3DAYS TD PT72
1.0000 | MEDICATED_PATCH | Freq: Once | TRANSDERMAL | Status: DC | PRN
  Administered 2017-12-22: 1.5 mg via TRANSDERMAL

## 2017-12-22 MED ORDER — LIDOCAINE HCL (CARDIAC) PF 100 MG/5ML IV SOSY
PREFILLED_SYRINGE | INTRAVENOUS | Status: DC | PRN
  Administered 2017-12-22: 30 mg via INTRAVENOUS

## 2017-12-22 MED ORDER — LIDOCAINE HCL (CARDIAC) PF 100 MG/5ML IV SOSY
PREFILLED_SYRINGE | INTRAVENOUS | Status: DC | PRN
  Administered 2017-12-22: 100 mg via INTRATRACHEAL

## 2017-12-22 MED ORDER — MEPERIDINE HCL 25 MG/ML IJ SOLN: 6.2500 mg | INTRAMUSCULAR | Status: DC | PRN

## 2017-12-22 MED ORDER — DEXAMETHASONE SODIUM PHOSPHATE 4 MG/ML IJ SOLN
INTRAMUSCULAR | Status: DC | PRN
  Administered 2017-12-22: 10 mg via INTRAVENOUS

## 2017-12-22 MED ORDER — PROMETHAZINE HCL 25 MG/ML IJ SOLN: 6.2500 mg | Freq: Four times a day (QID) | INTRAMUSCULAR | Status: DC | PRN

## 2017-12-22 MED ORDER — HYDROMORPHONE HCL 1 MG/ML IJ SOLN
0.2500 mg | INTRAMUSCULAR | Status: DC | PRN
  Administered 2017-12-22 (×2): 0.5 mg via INTRAVENOUS

## 2017-12-22 MED ORDER — GABAPENTIN 300 MG PO CAPS
ORAL_CAPSULE | ORAL | Status: AC
  Filled 2017-12-22: qty 1

## 2017-12-22 MED ORDER — SUGAMMADEX SODIUM 200 MG/2ML IV SOLN
INTRAVENOUS | Status: AC
  Filled 2017-12-22: qty 2

## 2017-12-22 MED ORDER — CELECOXIB 200 MG PO CAPS
200.0000 mg | ORAL_CAPSULE | ORAL | Status: AC
  Administered 2017-12-22: 200 mg via ORAL

## 2017-12-22 MED ORDER — GABAPENTIN 300 MG PO CAPS
300.0000 mg | ORAL_CAPSULE | ORAL | Status: AC
  Administered 2017-12-22: 300 mg via ORAL

## 2017-12-22 MED ORDER — OXYCODONE HCL 5 MG/5ML PO SOLN: 5.0000 mg | Freq: Once | ORAL | Status: DC | PRN

## 2017-12-22 SURGICAL SUPPLY — 55 items
BINDER BREAST XLRG (GAUZE/BANDAGES/DRESSINGS) ×3 IMPLANT
BLADE CLIPPER SURG (BLADE) ×3 IMPLANT
BLADE SURG 10 STRL SS (BLADE) ×3 IMPLANT
BLADE SURG 15 STRL LF DISP TIS (BLADE) ×2 IMPLANT
BLADE SURG 15 STRL SS (BLADE) ×1
BNDG GAUZE ELAST 4 BULKY (GAUZE/BANDAGES/DRESSINGS) ×6 IMPLANT
BRUSH SCRUB EZ PLAIN DRY (MISCELLANEOUS) ×3 IMPLANT
CANISTER SUCT 1200ML W/VALVE (MISCELLANEOUS) ×3 IMPLANT
CHLORAPREP W/TINT 26ML (MISCELLANEOUS) ×3 IMPLANT
COVER BACK TABLE 60X90IN (DRAPES) ×3 IMPLANT
COVER MAYO STAND STRL (DRAPES) ×3 IMPLANT
DECANTER SPIKE VIAL GLASS SM (MISCELLANEOUS) IMPLANT
DERMABOND ADVANCED (GAUZE/BANDAGES/DRESSINGS) ×2
DERMABOND ADVANCED .7 DNX12 (GAUZE/BANDAGES/DRESSINGS) ×4 IMPLANT
DRAPE TOP ARMCOVERS (MISCELLANEOUS) ×3 IMPLANT
DRAPE U-SHAPE 76X120 STRL (DRAPES) ×3 IMPLANT
DRSG PAD ABDOMINAL 8X10 ST (GAUZE/BANDAGES/DRESSINGS) ×3 IMPLANT
ELECT BLADE 4.0 EZ CLEAN MEGAD (MISCELLANEOUS) ×3
ELECT COATED BLADE 2.86 ST (ELECTRODE) ×3 IMPLANT
ELECT NEEDLE BLADE 2-5/6 (NEEDLE) ×3 IMPLANT
ELECT REM PT RETURN 9FT ADLT (ELECTROSURGICAL) ×3
ELECTRODE BLDE 4.0 EZ CLN MEGD (MISCELLANEOUS) ×2 IMPLANT
ELECTRODE REM PT RTRN 9FT ADLT (ELECTROSURGICAL) ×2 IMPLANT
GLOVE BIO SURGEON STRL SZ 6 (GLOVE) ×6 IMPLANT
GLOVE BIO SURGEON STRL SZ 6.5 (GLOVE) ×3 IMPLANT
GOWN STRL REUS W/ TWL LRG LVL3 (GOWN DISPOSABLE) ×4 IMPLANT
GOWN STRL REUS W/TWL LRG LVL3 (GOWN DISPOSABLE) ×2
NEEDLE HYPO 25X1 1.5 SAFETY (NEEDLE) ×3 IMPLANT
NS IRRIG 1000ML POUR BTL (IV SOLUTION) ×3 IMPLANT
PACK BASIN DAY SURGERY FS (CUSTOM PROCEDURE TRAY) ×3 IMPLANT
PENCIL BUTTON HOLSTER BLD 10FT (ELECTRODE) ×3 IMPLANT
PIN SAFETY STERILE (MISCELLANEOUS) ×3 IMPLANT
SHEET MEDIUM DRAPE 40X70 STRL (DRAPES) ×6 IMPLANT
SLEEVE SCD COMPRESS KNEE MED (MISCELLANEOUS) ×3 IMPLANT
SPONGE LAP 18X18 RF (DISPOSABLE) ×3 IMPLANT
STAPLER VISISTAT 35W (STAPLE) ×3 IMPLANT
STRIP CLOSURE SKIN 1/2X4 (GAUZE/BANDAGES/DRESSINGS) IMPLANT
SUT CHROMIC 4 0 P 3 18 (SUTURE) IMPLANT
SUT MNCRL AB 4-0 PS2 18 (SUTURE) ×6 IMPLANT
SUT MON AB 5-0 P3 18 (SUTURE) ×6 IMPLANT
SUT PLAIN 5 0 P 3 18 (SUTURE) IMPLANT
SUT SILK 4 0 PS 2 (SUTURE) ×6 IMPLANT
SUT VIC AB 3-0 PS1 18 (SUTURE) ×2
SUT VIC AB 3-0 PS1 18XBRD (SUTURE) ×4 IMPLANT
SUT VIC AB 3-0 SH 27 (SUTURE)
SUT VIC AB 3-0 SH 27X BRD (SUTURE) IMPLANT
SUT VICRYL 4-0 PS2 18IN ABS (SUTURE) ×6 IMPLANT
SYR BULB IRRIGATION 50ML (SYRINGE) ×3 IMPLANT
SYR CONTROL 10ML LL (SYRINGE) ×3 IMPLANT
TAPE MEASURE VINYL STERILE (MISCELLANEOUS) ×3 IMPLANT
TOWEL OR 17X24 6PK STRL BLUE (TOWEL DISPOSABLE) ×6 IMPLANT
TRAY DSU PREP LF (CUSTOM PROCEDURE TRAY) ×3 IMPLANT
TUBE CONNECTING 20X1/4 (TUBING) ×3 IMPLANT
UNDERPAD 30X30 (UNDERPADS AND DIAPERS) ×6 IMPLANT
YANKAUER SUCT BULB TIP NO VENT (SUCTIONS) ×3 IMPLANT

## 2017-12-22 NOTE — Op Note (Signed)
Operative Note   DATE OF OPERATION: 5.17.19  LOCATION: [facility]-outpatient  SURGICAL DIVISION: Plastic Surgery  PREOPERATIVE DIAGNOSES:  1. History left breast cancer 2. Acquired absence breast 3. Asymmetry native and reconstructed breast 4. History therapeutic radiation  POSTOPERATIVE DIAGNOSES:  same  PROCEDURE:  1. Left nipple areolar complex reconstruction with local flap and full thickness skin graft from left groin 2. Right mastopexy  SURGEON: Irene Limbo MD MBA  ASSISTANT: S Rayburn PA-C  ANESTHESIA:  General.   EBL: 50 ml  COMPLICATIONS: None immediate.   INDICATIONS FOR PROCEDURE:  The patient, Michele Salas, is a 37 y.o. female born on [birth year], is here for left NAC reconstruction following delayed implant based reconstruction with latissimus flap. She has undergone prior right augmentation and plan mastopexy for symmetry.   FINDINGS: Skin only mastopexy right breast performed.   DESCRIPTION OF PROCEDURE:  The patient's operative site was marked with the patient in the preoperative areato include planned nipple location over left breast reconstructed mound and circumvertical mastopexy over right breast. New nipple position on right marked symmetric with planned reconstruction on left.The patientwas taken to the operating room. SCDs were placed and IV antibiotics were given. The patient's operative site was prepped and draped in a sterile fashion. A time out was performed and all information was confirmed to be correct.Local anesthetic infiltrated over left  groin donor site. Elliptical excision of skin of left groin crease completed. The graft was prepared by removing subcutaneous fat to dermis. The donor site was closed with 3-0 vicryl in dermis and running 4-0 monocryl subcuticular skin closure. Dermabond applied.  Over left chest, Skate flap designed within latissimus flap skin paddle. The flap margins were sharply incised, pedicle preserved over  inferior chest. Flap including subcutaneous tissue elevated and inset with 5-0 monocryl in dermis and 5-0 monocryl for skin. The remainder of marked area for areola was deepithelialized. The skin graft with inset with 5-0 chromic.   At this time patient brought to upright sitting position and markings over right breast assessed for symmetry and adjusted. She was returned to supine position. 42 mm diameter cookie cutter used to mark NAC. The area within circumvertical mastopexy design was de epithelialized. The dermis was incised maintaining superior pedicle. The medial and lateral skin flaps elevated until tension free closure obtained. Closure completed with 3-0 vicryl dermis along vertical limb, 4-0 vicryl dermis to inset NAC. Skin closure completed with 4-0 monocryl subcuticular and Dermabond applied.   The graft was perforated and antibiotic ointment and Adaptic applied over nipple reconstruction and graft. Sterile sponge applied as bolster and secured with 4-0 silk.  Dry dressing, breast binder applied.  The patient was allowed to wake from anesthesia, extubated and taken to the recovery room in satisfactory condition.   SPECIMENS: right mastopexy  DRAINS: none  Irene Limbo, MD [facility] Plastic & Reconstructive Surgery [phone], pin [phone]

## 2017-12-22 NOTE — Transfer of Care (Signed)
Immediate Anesthesia Transfer of Care Note  Patient: Michele Salas  Procedure(s) Performed: RIGHT MASTOPEXY, (Right Breast) LEFT NAC RECONSTRUCTION WITH LOCAL FLAP  AND FTSG FROM LEFT OR RIGHT GROIN (Left Groin)  Patient Location: PACU  Anesthesia Type:General  Level of Consciousness: sedated  Airway & Oxygen Therapy: Patient Spontanous Breathing and Patient connected to face mask oxygen  Post-op Assessment: Report given to RN and Post -op Vital signs reviewed and stable  Post vital signs: Reviewed and stable  Last Vitals:  Vitals Value Taken Time  BP    Temp    Pulse 95 12/22/2017  2:36 PM  Resp 17 12/22/2017  2:36 PM  SpO2 100 % 12/22/2017  2:36 PM  Vitals shown include unvalidated device data.  Last Pain:  Vitals:   12/22/17 1106  TempSrc: Oral  PainSc: 0-No pain      Patients Stated Pain Goal: 0 (61/51/83 4373)  Complications: No apparent anesthesia complications

## 2017-12-22 NOTE — Anesthesia Postprocedure Evaluation (Signed)
Anesthesia Post Note  Patient: Michele Salas  Procedure(s) Performed: RIGHT MASTOPEXY (Right Breast) LEFT NAC RECONSTRUCTION WITH LOCAL FLAP  AND FTSG FROM LEFT OR RIGHT GROIN (Left Groin)     Patient location during evaluation: PACU Anesthesia Type: General Level of consciousness: sedated and patient cooperative Pain management: pain level controlled Vital Signs Assessment: post-procedure vital signs reviewed and stable Respiratory status: spontaneous breathing Cardiovascular status: stable Anesthetic complications: no    Last Vitals:  Vitals:   12/22/17 1645 12/22/17 1700  BP: (!) 154/88 (!) 150/89  Pulse: 83 82  Resp: 16 15  Temp:    SpO2: 95% 95%    Last Pain:  Vitals:   12/22/17 1645  TempSrc:   PainSc: [place]

## 2017-12-22 NOTE — Anesthesia Preprocedure Evaluation (Signed)
Anesthesia Evaluation  Patient identified by MRN, date of birth, ID band Patient awake    Reviewed: Allergy & Precautions, NPO status , Patient's Chart, lab work & pertinent test results  History of Anesthesia Complications (+) PONV and history of anesthetic complications  Airway Mallampati: II  TM Distance: >3 FB Neck ROM: Full    Dental no notable dental hx. (+) Dental Advisory Given   Pulmonary neg pulmonary ROS,    Pulmonary exam normal        Cardiovascular negative cardio ROS Normal cardiovascular exam  Study Conclusions  - Left ventricle: The cavity size was normal. Wall thickness was   normal. Systolic function was normal. The estimated ejection   fraction was in the range of 60% to 65%. Wall motion was normal;   there were no regional wall motion abnormalities.    Neuro/Psych  Headaches,    GI/Hepatic negative GI ROS, (+) Hepatitis -, Unspecified  Endo/Other  negative endocrine ROS  Renal/GU negative Renal ROS  negative genitourinary   Musculoskeletal negative musculoskeletal ROS (+)   Abdominal   Peds  Hematology negative hematology ROS (+) anemia ,   Anesthesia Other Findings Day of surgery medications reviewed with the patient.  Reproductive/Obstetrics                             Anesthesia Physical  Anesthesia Plan  ASA: III  Anesthesia Plan: General   Post-op Pain Management:    Induction: Intravenous  PONV Risk Score and Plan: 4 or greater and Ondansetron, Dexamethasone, Scopolamine patch - Pre-op, Midazolam and Treatment may vary due to age or medical condition  Airway Management Planned: LMA  Additional Equipment:   Intra-op Plan:   Post-operative Plan: Extubation in OR  Informed Consent: I have reviewed the patients History and Physical, chart, labs and discussed the procedure including the risks, benefits and alternatives for the proposed anesthesia  with the patient or authorized representative who has indicated his/her understanding and acceptance.   Dental advisory given  Plan Discussed with: CRNA  Anesthesia Plan Comments: (Interpreter was used for the interview)        Anesthesia Quick Evaluation

## 2017-12-22 NOTE — Op Note (Signed)
First Assist Op Note: [facility]  I assisted the Surgeon(s) ____Dr. Arnoldo Hooker Thimmappa___ on the procedure(s): __Right mastopexy and left nipple reconstruction with full thickness skin graft for areola from left groin____on  Date  5/17/2019_______  I provided my assistance on this case as follows:  I was present and acted as first Environmental consultant during this operation. I was present during the patient transport into the operative suite and assisted the OR staff with transferring and positioning of the patient. All extremities were checked and properly cushioned and safety straps in place. I was involved in the prepping and placement of sterile drapes. A time out was performed and all information confirmed to be correct.  I first assisted during the case including retraction for exposure, assisting with closure of surgical wounds and application of sterile dressings. I provided assistance with application of post operative garments/splinting and assisted with patient transfer back to the stretcher as needed.   Jonael Paradiso,PA-C Plastic Surgery [phone]

## 2017-12-22 NOTE — Anesthesia Procedure Notes (Signed)
Procedure Name: Intubation Date/Time: 12/22/2017 12:32 PM Performed by: Marrianne Mood, CRNA Pre-anesthesia Checklist: Patient identified, Emergency Drugs available, Suction available, Patient being monitored and Timeout performed Patient Re-evaluated:Patient Re-evaluated prior to induction Oxygen Delivery Method: Circle system utilized Preoxygenation: Pre-oxygenation with 100% oxygen Induction Type: IV induction Ventilation: Mask ventilation without difficulty Grade View: Grade III Tube type: Oral Tube size: 7.0 mm Number of attempts: 1 Airway Equipment and Method: Stylet and Oral airway Placement Confirmation: ETT inserted through vocal cords under direct vision,  positive ETCO2 and breath sounds checked- equal and bilateral Secured at: 20 cm Tube secured with: Tape Dental Injury: Teeth and Oropharynx as per pre-operative assessment

## 2017-12-22 NOTE — H&P (Signed)
  Subjective:     Patient ID: Michele Salas is a 37 y.o. female.  HPI  8 months post op. Plan NAC reconstruction.  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 Completed adjuvant chemotherapy, and adjuvant radiation completed 12.12.17.  Genetics negative. Screening MMG 3.2019 normal  Previous size 34/36 B. Left mastectomy 278 g  Review of Systems    Objective:   Physical Exam  Cardiovascular: Normal rate, regular rhythm and normal heart sounds.   Pulmonary/Chest: Effort normal and breath sounds normal.   Chest:  Scars maturing, Soft, volume symmetric with more upper pole fullness left, right NAC over lower pole SN to nipple R 22  Nipple to IMF R 5 cm No lateral displacement implants in supine position   Assessment:     S/p left mastectomy, adjuvant radiation S/p left LD+TE reconstruction S/p removal TE, placement saline implant, right augmentation    Plan:      Right NAC slightly lower than desired over implant, plan RIGHT mastopexy with circumareolar +/- vertical scar.  For NAC reconstruction reviewed options, tattoo alone in office, referral to 3d tattoo artist, local flap for nipple reconstruction +/- FTSG areola. Reviewed risk of latter is loss height nipple reconstruction, color skin graft may not match native NAC and may still require tattoo for color. Counseled in my experience the nipple reconstruction height is maintained better with skin graft over donor site vs the primary closure, but especially with her skin type the color may not match. She would like to proceed with nipple reconstruction and skin graft to areola.  Plan OP surgery. Discussed bolster dressing over graft and plan groin as donor site. Rx for Norco given.  RIGHT breast Natrelle Smooth Round Low Profile Saline 125 ml implant, fill volume 145 ml. REF [id number], LEFT chest  Natrelle Smooth Round High Profile Saline 350 ml implant, fill volume 350 ml. REF [id number],     Irene Limbo, MD [facility] Plastic & Reconstructive Surgery [phone], pin [phone]

## 2017-12-22 NOTE — Discharge Instructions (Signed)

## 2017-12-25 ENCOUNTER — Encounter (HOSPITAL_BASED_OUTPATIENT_CLINIC_OR_DEPARTMENT_OTHER): Payer: Self-pay | Admitting: Plastic Surgery

## 2018-01-05 ENCOUNTER — Encounter (HOSPITAL_BASED_OUTPATIENT_CLINIC_OR_DEPARTMENT_OTHER): Payer: Self-pay | Admitting: Plastic Surgery

## 2018-04-27 ENCOUNTER — Inpatient Hospital Stay (HOSPITAL_COMMUNITY): Payer: Self-pay | Attending: Hematology

## 2018-04-27 DIAGNOSIS — C50112 Malignant neoplasm of central portion of left female breast: Secondary | ICD-10-CM

## 2018-04-27 DIAGNOSIS — R21 Rash and other nonspecific skin eruption: Secondary | ICD-10-CM | POA: Insufficient documentation

## 2018-04-27 DIAGNOSIS — Z171 Estrogen receptor negative status [ER-]: Secondary | ICD-10-CM

## 2018-04-27 DIAGNOSIS — Z9221 Personal history of antineoplastic chemotherapy: Secondary | ICD-10-CM | POA: Insufficient documentation

## 2018-04-27 DIAGNOSIS — Z923 Personal history of irradiation: Secondary | ICD-10-CM | POA: Insufficient documentation

## 2018-04-27 DIAGNOSIS — N920 Excessive and frequent menstruation with regular cycle: Secondary | ICD-10-CM | POA: Insufficient documentation

## 2018-04-27 DIAGNOSIS — D509 Iron deficiency anemia, unspecified: Secondary | ICD-10-CM | POA: Insufficient documentation

## 2018-04-27 DIAGNOSIS — Z9012 Acquired absence of left breast and nipple: Secondary | ICD-10-CM | POA: Insufficient documentation

## 2018-04-27 DIAGNOSIS — M79673 Pain in unspecified foot: Secondary | ICD-10-CM | POA: Insufficient documentation

## 2018-04-27 DIAGNOSIS — Z853 Personal history of malignant neoplasm of breast: Secondary | ICD-10-CM | POA: Insufficient documentation

## 2018-04-27 LAB — CBC WITH DIFFERENTIAL/PLATELET
Basophils Absolute: 0 10*3/uL (ref 0.0–0.1)
Basophils Relative: 0 %
Eosinophils Absolute: 0.2 10*3/uL (ref 0.0–0.7)
Eosinophils Relative: 2 %
HCT: 41.4 % (ref 36.0–46.0)
Hemoglobin: 14.3 g/dL (ref 12.0–15.0)
Lymphocytes Relative: 31 %
Lymphs Abs: 2.3 10*3/uL (ref 0.7–4.0)
MCH: 29.9 pg (ref 26.0–34.0)
MCHC: 34.5 g/dL (ref 30.0–36.0)
MCV: 86.6 fL (ref 78.0–100.0)
Monocytes Absolute: 0.6 10*3/uL (ref 0.1–1.0)
Monocytes Relative: 8 %
Neutro Abs: 4.2 10*3/uL (ref 1.7–7.7)
Neutrophils Relative %: 59 %
Platelets: 318 10*3/uL (ref 150–400)
RBC: 4.78 MIL/uL (ref 3.87–5.11)
RDW: 13.2 % (ref 11.5–15.5)
WBC: 7.3 10*3/uL (ref 4.0–10.5)

## 2018-04-27 LAB — COMPREHENSIVE METABOLIC PANEL
ALT: 37 U/L (ref 0–44)
AST: 28 U/L (ref 15–41)
Albumin: 4.4 g/dL (ref 3.5–5.0)
Alkaline Phosphatase: 105 U/L (ref 38–126)
Anion gap: 4 — ABNORMAL LOW (ref 5–15)
BUN: 8 mg/dL (ref 6–20)
CO2: 29 mmol/L (ref 22–32)
Calcium: 9.3 mg/dL (ref 8.9–10.3)
Chloride: 107 mmol/L (ref 98–111)
Creatinine, Ser: 0.53 mg/dL (ref 0.44–1.00)
GFR calc Af Amer: >60 mL/min (ref >60)
GFR calc non Af Amer: >60 mL/min (ref >60)
Glucose, Bld: 125 mg/dL — ABNORMAL HIGH (ref 70–99)
Potassium: 3.7 mmol/L (ref 3.5–5.1)
Sodium: 140 mmol/L (ref 135–145)
Total Bilirubin: 0.8 mg/dL (ref 0.3–1.2)
Total Protein: 7.7 g/dL (ref 6.5–8.1)

## 2018-04-27 LAB — LACTATE DEHYDROGENASE: LDH: 153 U/L (ref 98–192)

## 2018-04-27 LAB — FERRITIN: Ferritin: 17 ng/mL (ref 11–307)

## 2018-05-01 LAB — HEMOGLOBINOPATHY EVALUATION
Hgb A2 Quant: 2.1 % (ref 1.8–3.2)
Hgb A: 97.9 % (ref 96.4–98.8)
Hgb C: 0 %
Hgb F Quant: 0 % (ref 0.0–2.0)
Hgb S Quant: 0 %
Hgb Variant: 0 %

## 2018-05-04 ENCOUNTER — Encounter (HOSPITAL_COMMUNITY): Payer: Self-pay | Admitting: Internal Medicine

## 2018-05-04 ENCOUNTER — Inpatient Hospital Stay (HOSPITAL_BASED_OUTPATIENT_CLINIC_OR_DEPARTMENT_OTHER): Payer: Self-pay | Admitting: Internal Medicine

## 2018-05-04 DIAGNOSIS — R21 Rash and other nonspecific skin eruption: Secondary | ICD-10-CM

## 2018-05-04 DIAGNOSIS — Z9012 Acquired absence of left breast and nipple: Secondary | ICD-10-CM

## 2018-05-04 DIAGNOSIS — M79673 Pain in unspecified foot: Secondary | ICD-10-CM

## 2018-05-04 DIAGNOSIS — Z171 Estrogen receptor negative status [ER-]: Secondary | ICD-10-CM

## 2018-05-04 DIAGNOSIS — C50112 Malignant neoplasm of central portion of left female breast: Secondary | ICD-10-CM

## 2018-05-04 DIAGNOSIS — D509 Iron deficiency anemia, unspecified: Secondary | ICD-10-CM

## 2018-05-04 DIAGNOSIS — Z853 Personal history of malignant neoplasm of breast: Secondary | ICD-10-CM

## 2018-05-04 DIAGNOSIS — Z923 Personal history of irradiation: Secondary | ICD-10-CM

## 2018-05-04 DIAGNOSIS — Z9221 Personal history of antineoplastic chemotherapy: Secondary | ICD-10-CM

## 2018-05-04 DIAGNOSIS — N920 Excessive and frequent menstruation with regular cycle: Secondary | ICD-10-CM

## 2018-05-04 MED ORDER — FERROUS SULFATE ER 50 MG PO TBCR: 1.0000 | EXTENDED_RELEASE_TABLET | Freq: Every day | ORAL | 6 refills | Status: DC

## 2018-05-04 NOTE — Progress Notes (Signed)
Diagnosis Malignant neoplasm of central portion of left breast in female, estrogen receptor negative ([place]) - Plan: MM 3D SCREEN BREAST W/IMPLANT UNI RIGHT, CBC with Differential/Platelet, Comprehensive metabolic panel, Lactate dehydrogenase, Ferritin  Iron deficiency anemia, unspecified iron deficiency anemia type - Plan: Ferrous Sulfate 50 MG TBCR  Staging Cancer Staging Cancer of central portion of left female breast [facility]) Staging form: Breast, AJCC 7th Edition - Pathologic stage from 12/11/2015: Stage IIB (T2, N1a, cM0) - Signed by Baird Cancer, PA-C on 12/31/2015   Assessment and Plan:  1.  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  37 yr old pt previously followed by Dr. Talbert Cage.    She had a mammogram performed 09/16/2015 that showed a 1.6 cm retroareolar left breast mass measuring 1.6 cm with 3 abnormal left axillary lymph nodes noted.  She underwent biopsy of the left breast mass as well as the left axillary lymph nodes on 09/23/2015.  Pathology returned as invasive ductal carcinoma high-grade left axillary lymph node was positive for metastatic cancer.  Her tumor was ER, PR negative HER-2 negative.  Ki-67 was 85%.  She underwent staging workup with a bone scan on 10/26/2015 that was negative.  CT chest, abdomen, pelvis was done 10/27/2015 that showed the left breast mass and left axillary lymph nodes with no other evidence of metastatic disease.  She underwent left mastectomy and axillary lymph node dissection with Dr. Arnoldo Morale on 12/11/2015 with pathology returning as invasive ductal grade 3 measuring 2.3 cm.  2/10 lymph nodes were positive for malignancy.  She was triple negative.  She was treated with 4 cycles of AC followed by 12 weeks of Taxol which was completed in October 2017.  She was also treated with radiation 33 fractions which was completed in December 2017.  Right screening mammogram done 10/19/2017 was negative for malignancy.  She is recommended for repeat right screening  mammogram in March 2020.    Pt had right breast skin biopsy done 12/22/2017 that was benign.  She had liver biopsy done 10/24/2016 that showed fatty liver.    Labs done 04/27/2018 reviewed and showed WBC 7.3 HB 14.3 plts 318,000.  Chemistries WNL with K+ 3.7, Cr 0.53 and normal LFTS.   Pt will be seen for follow-up in 10/2018 for follow-up with mammogram.    2.  Iron deficiency anemia.  Labs done 04/27/2018 reviewed and showed Hb 14.3 and ferritin of 17.  HB electrophoresis WNL.  Pt was given option of oral or IV iron.  Pt desires oral iron.  Rx sent to pharmacy.  She is referred to GYN for evaluation due to menorrhagia.  Will repeat labs on RTC.    3.  Menorrhagia.  Pelvic ultrasound done on 10/24/2017 that was negative.  She is referred to GYN for evaluation due to IDA.    4.  Skin rash.  She reports this on upper thighs.  Chaperone present and exam shows findings that appear consistent with shaving.  Pt advised to use milder moisturizer.  Option for dermatology evaluation presented if symptoms persist.    5.  Abnormal liver function tests.  CT of the abdomen and pelvis done on 10/05/2016 showed a fatty liver.  She had previous liver biopsy done 10/24/2016 with pathology consistent with fatty liver.  LFTs on labs done 04/27/2018 WNL.    6.  Foot pain.  Pt recommended for Tylenol or ES tylenol prn.  Notify office if symptoms worsen.    Greater than 30 minutes spent with  more than 50% spent in counseling and coordination of care.    Interval History  Historical data obtained from note dated 10/27/2017.  37 old female previously followed by Dr. Talbert Cage.    She had a mammogram performed 09/16/2015 that showed a 1.6 cm retroareolar left breast mass measuring 1.6 cm with 3 abnormal left axillary lymph nodes noted.  She underwent biopsy of the left breast mass as well as the left axillary lymph nodes on 09/23/2015.  Pathology returned as invasive ductal carcinoma high-grade left axillary lymph node was positive for  metastatic cancer.  Her tumor was ER, PR negative HER-2 negative.  Ki-67 was 85%.  She underwent staging workup with a bone scan on 10/26/2015 that was negative.  CT chest, abdomen, pelvis was done 10/27/2015 that showed the left breast mass and left axillary lymph nodes with no other evidence of metastatic disease.  She underwent left mastectomy and axillary lymph node dissection with Dr. Arnoldo Morale on 12/11/2015 with pathology returning as invasive ductal grade 3 measuring 2.3 cm.  2/10 lymph nodes were positive for malignancy.  She was triple negative.  She was treated with 4 cycles of AC followed by 12 weeks of Taxol which was completed in October 2017.  She was also treated with radiation 33 fractions which was completed in December 2017.  Current Status: 37 year old female seen  for follow-up.  She is here to go over labs.  She reports skin rash on thighs and some foot pain. Interpreter present.    Pathology:     Pathology from [facility]:  Biopsy of left breast mass at the 12:00 position demonstrates invasive ductal carcinoma, high-grade.  Left axillary lymph node biopsy demonstrates invasive ductal carcinoma, high-grade, consistent with metastasis to lymph node. Breast prognostic panel is performed demonstrating ER/PR negativity, HER-2/neu negativity, and a KI-67 Marker of 85%.    Cancer of central portion of left female breast ([place])   09/16/2015 Mammogram    1.6 cm retroareolar left breast mass measuring 1.6 cm in largest diameter in addition to 3 abnormal appearing left inferior axillary lymph nodes.    09/23/2015 Initial Biopsy    US guided core biopsy of left breast mass and suspicious left axillary lymph nodes.    09/23/2015 Pathology Results    Invasive ductal carcinoma, high grade.  Left axilla lymph node is positive for metastatic carcinoma.  ER/PR NEGATIVE, HER2 NEGATIVE, KI-67 marker of 85%.    10/26/2015 Imaging    Bone scan- No evidence of metastatic disease to the skeleton.     10/27/2015 Imaging    CT CAP-  Retroareolar mass with 2 asymmetrically prominent left axillary lymph nodes and an upper normal size left subpectoral lymph node. No other findings of metastatic disease in the chest, abdomen, or pelvis.    11/05/2015 Echocardiogram    The estimated ejection fraction was in the range of 60% to 65%.    11/23/2015 Miscellaneous    Genetic Counseling- genetic test result was negative for any known pathogenic mutations within any of 28 genes that would cause her to be at an increased genetic risk for breast, ovarian, or other related cancers.    12/09/2015 Surgery    L radical mastectomy, axillary LN dissection, port a cath insertion with Dr. Aviva Signs 12/09/2015    12/11/2015 Pathology Results    - INVASIVE GRADE III DUCTAL CARCINOMA, SPANNING 2.3 CM IN GREATEST DIMENSION. - ASSOCIATED HIGH GRADE DCIS - MARGINS ARE NEGATIVE. - TWO LYMPH NODES POSITIVE FOR METASTATIC DUCTAL CARCINOMA (  2/2). - EIGHT ADDITIONAL LYMPH NODES WITH NO TUMOR SEEN    12/11/2015 Pathology Results    HER2 NEGATIVE, ER 0%, PR 0%, Ki-67 marker 90%    12/25/2015 - 02/12/2016 Chemotherapy    AC every 2 weeks x 4 cycles    02/04/2016 Treatment Plan Change    Treatment deferred x 7 days due to neutropenia    02/25/2016 - 05/12/2016 Chemotherapy    Paclitaxel weekly x 12    06/01/2016 - 07/19/2016 Radiation Therapy    33 fractions, Dr. Lisbeth Renshaw in [place]    02/06/2017 Surgery    (L) breast reconstruction (lat flap with tissue expander) with Dr. Iran Planas      Problem List Patient Active Problem List   Diagnosis Date Noted  . 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 Transaminitis [R74.0] 09/28/2016  . Genetic testing [Z13.79] 12/20/2015  . S/P mastectomy [Z90.10] 12/09/2015  . Family history of uterine cancer [Z80.49] 10/29/2015  . Iron deficiency anemia [D50.9] 10/27/2015  . Cancer of central portion of left female breast ([place]) [C50.112] 10/22/2015  . Cephalalgia [R51]  08/26/2015    Past Medical History Past Medical History:  Diagnosis Date  . History of breast cancer 10/2015   left  . History of chemotherapy 2017  . History of radiation therapy   . PONV (postoperative nausea and vomiting)    nausea only    Past Surgical History Past Surgical History:  Procedure Laterality Date  . BREAST ENHANCEMENT SURGERY Right 05/19/2017   Procedure: Augmentation Right Breast with Saline Implant for symmetry;  Surgeon: Irene Limbo, MD;  Location: [place];  Service: Plastics;  Laterality: Right;  . LATISSIMUS FLAP TO BREAST Left 02/06/2017   Procedure: LEFT LATISSIMUS FLAP TO BREAST;  Surgeon: Irene Limbo, MD;  Location: [place];  Service: Plastics;  Laterality: Left;  Marland Kitchen MASTECTOMY MODIFIED RADICAL Left 12/09/2015   Procedure: LEFT MODIFIED RADICAL MASTECTOMY;  Surgeon: Aviva Signs, MD;  Location: [facility];  Service: General;  Laterality: Left;  Marland Kitchen MASTOPEXY Right 12/22/2017   Procedure: RIGHT MASTOPEXY;  Surgeon: Irene Limbo, MD;  Location: [place];  Service: Plastics;  Laterality: Right;  . PORT-A-CATH REMOVAL Right 05/19/2017   Procedure: REMOVAL PORT-A-CATH;  Surgeon: Irene Limbo, MD;  Location: [place];  Service: Plastics;  Laterality: Right;  . PORTACATH PLACEMENT Right 12/09/2015   Procedure: INSERTION PORT-A-CATH RIGHT SUBCLAVIAN;  Surgeon: Aviva Signs, MD;  Location: [facility];  Service: General;  Laterality: Right;  . REMOVAL OF TISSUE EXPANDER AND PLACEMENT OF IMPLANT Left 05/19/2017   Procedure: REMOVAL OF LEFT TISSUE EXPANDER AND PLACEMENT OF SALINE IMPLANT;  Surgeon: Irene Limbo, MD;  Location: [place];  Service: Plastics;  Laterality: Left;  . SKIN FULL THICKNESS GRAFT Left 12/22/2017   Procedure: LEFT NIPPLE AREOLAR COMPLEX RECONSTRUCTION WITH LOCAL FLAP  AND FULL THICKNESS SKIN GRAFT FROM LEFT GROIN;  Surgeon: Irene Limbo, MD;  Location: [place];  Service: Plastics;  Laterality: Left;  . TISSUE EXPANDER PLACEMENT Left 02/06/2017   Procedure: TISSUE EXPANDER PLACEMENT LEFT BREAST;  Surgeon: Irene Limbo, MD;  Location: [place];  Service: Plastics;  Laterality: Left;    Family History Family History  Problem Relation Age of Onset  . Diabetes Mother   . Heart attack Paternal Uncle   . Heart attack Paternal Grandfather 16     Social History  reports that she has never smoked. She has never used smokeless tobacco. She reports  that she does not drink alcohol or use drugs.  Medications  Current Outpatient Medications:  .  cetirizine (ZYRTEC) 10 MG tablet, Take 10 mg by mouth daily., Disp: , Rfl:  .  Ferrous Sulfate 50 MG TBCR, Take 1 tablet by mouth daily., Disp: 30 tablet, Rfl: 6 .  ibuprofen (ADVIL,MOTRIN) 800 MG tablet, Take 800 mg by mouth every 8 (eight) hours as needed for cramping., Disp: , Rfl:  .  Multiple Vitamin (MULTIVITAMIN) tablet, Take 1 tablet by mouth daily., Disp: , Rfl:  .  Multiple Vitamins-Minerals (MULTIVITAMIN WITH MINERALS) tablet, Take 1 tablet by mouth daily., Disp: , Rfl:   Allergies Patient has no known allergies.  Review of Systems Review of Systems - Oncology ROS thigh rash and foot pain   Physical Exam  Vitals Wt Readings from Last 3 Encounters:  05/04/18 (P) 147 lb 4.8 oz (66.8 kg)  12/22/17 157 lb (71.2 kg)  10/30/17 154 lb 8 oz (70.1 kg)   Temp Readings from Last 3 Encounters:  05/04/18 (P) 98.3 F (36.8 C) ((P) Oral)  12/22/17 98 F (36.7 C)  10/30/17 97.9 F (36.6 C)   BP Readings from Last 3 Encounters:  05/04/18 (P) 122/75  12/22/17 131/88  10/30/17 124/77   Pulse Readings from Last 3 Encounters:  05/04/18 (P) 82  12/22/17 79  10/30/17 79   Constitutional: Well-developed, well-nourished, and in no distress.   HENT: Head: Normocephalic and atraumatic.  Mouth/Throat: No oropharyngeal exudate. Mucosa moist. Eyes: Pupils are equal, round, and reactive to  light. Conjunctivae are normal. No scleral icterus.  Neck: Normal range of motion. Neck supple. No JVD present.  Cardiovascular: Normal rate, regular rhythm and normal heart sounds.  Exam reveals no gallop and no friction rub.   No murmur heard. Pulmonary/Chest: Effort normal and breath sounds normal. No respiratory distress. No wheezes.No rales.  Abdominal: Soft. Bowel sounds are normal. No distension. There is no tenderness. There is no guarding.  Musculoskeletal: No edema or tenderness.  Lymphadenopathy: No cervical, axillary or supraclavicular adenopathy.  Neurological: Alert and oriented to person, place, and time. No cranial nerve deficit.  Skin: Skin is warm and dry. No rash noted. No erythema. No pallor.  Psychiatric: Affect and judgment normal.  Breast exam:  Chaperone present.  Left mastectomy with reconstruction.  Right breast with implant.  No dominant masses noted.    Labs No visits with results within 3 Day(s) from this visit.  Latest known visit with results is:  Appointment on 04/27/2018  Component Date Value Ref Range Status  . WBC 04/27/2018 7.3  4.0 - 10.5 K/uL Final  . RBC 04/27/2018 4.78  3.87 - 5.11 MIL/uL Final  . Hemoglobin 04/27/2018 14.3  12.0 - 15.0 g/dL Final  . HCT 04/27/2018 41.4  36.0 - 46.0 % Final  . MCV 04/27/2018 86.6  78.0 - 100.0 fL Final  . MCH 04/27/2018 29.9  26.0 - 34.0 pg Final  . MCHC 04/27/2018 34.5  30.0 - 36.0 g/dL Final  . RDW 04/27/2018 13.2  11.5 - 15.5 % Final  . Platelets 04/27/2018 318  150 - 400 K/uL Final  . Neutrophils Relative % 04/27/2018 59  % Final  . Neutro Abs 04/27/2018 4.2  1.7 - 7.7 K/uL Final  . Lymphocytes Relative 04/27/2018 31  % Final  . Lymphs Abs 04/27/2018 2.3  0.7 - 4.0 K/uL Final  . Monocytes Relative 04/27/2018 8  % Final  . Monocytes Absolute 04/27/2018 0.6  0.1 - 1.0 K/uL Final  .  Eosinophils Relative 04/27/2018 2  % Final  . Eosinophils Absolute 04/27/2018 0.2  0.0 - 0.7 K/uL Final  . Basophils Relative  04/27/2018 0  % Final  . Basophils Absolute 04/27/2018 0.0  0.0 - 0.1 K/uL Final   Performed at [facility], [address], [place], [place] [address]  . Sodium 04/27/2018 140  135 - 145 mmol/L Final  . Potassium 04/27/2018 3.7  3.5 - 5.1 mmol/L Final  . Chloride 04/27/2018 107  98 - 111 mmol/L Final  . CO2 04/27/2018 29  22 - 32 mmol/L Final  . Glucose, Bld 04/27/2018 125* 70 - 99 mg/dL Final  . BUN 04/27/2018 8  6 - 20 mg/dL Final  . Creatinine, Ser 04/27/2018 0.53  0.44 - 1.00 mg/dL Final  . Calcium 04/27/2018 9.3  8.9 - 10.3 mg/dL Final  . Total Protein 04/27/2018 7.7  6.5 - 8.1 g/dL Final  . Albumin 04/27/2018 4.4  3.5 - 5.0 g/dL Final  . AST 04/27/2018 28  15 - 41 U/L Final  . ALT 04/27/2018 37  0 - 44 U/L Final  . Alkaline Phosphatase 04/27/2018 105  38 - 126 U/L Final  . Total Bilirubin 04/27/2018 0.8  0.3 - 1.2 mg/dL Final  . GFR calc non Af Amer 04/27/2018 >60  >60 mL/min Final  . GFR calc Af Amer 04/27/2018 >60  >60 mL/min Final   Comment: (NOTE) The eGFR has been calculated using the CKD EPI equation. This calculation has not been validated in all clinical situations. eGFR's persistently <60 mL/min signify possible Chronic Kidney Disease.   Georgiann Hahn gap 04/27/2018 4* 5 - 15 Final   Performed at [facility], [address], [place], [place] [address]  . Ferritin 04/27/2018 17  11 - 307 ng/mL Final   Performed at [facility], [address], [place], [place] [address]  . LDH 04/27/2018 153  98 - 192 U/L Final   Performed at [facility], [address], [place], [place] [address]  . Hgb A2 Quant 04/27/2018 2.1  1.8 - 3.2 % Final  . Hgb F Quant 04/27/2018 0.0  0.0 - 2.0 % Final  . Hgb S Quant 04/27/2018 0.0  0.0 % Final  . Hgb C 04/27/2018 0.0  0.0 % Corrected  . Hgb A 04/27/2018 97.9  96.4 - 98.8 % Final  . Hgb Variant 04/27/2018 0.0  0.0 % Corrected  . Please Note: 04/27/2018 Comment   Corrected   Comment: (NOTE) Normal adult hemoglobin present. Performed At: [facility] [place], [place] [id number] Rush Farmer MD WN:[id number]      Pathology Orders Placed This Encounter  Procedures  . MM 3D SCREEN BREAST W/IMPLANT UNI RIGHT    Standing Status:   Future    Standing Expiration Date:   07/05/2019    Order Specific Question:   Reason for Exam (SYMPTOM  OR DIAGNOSIS REQUIRED)    Answer:   left breast cancer    Order Specific Question:   Is the patient pregnant?    Answer:   No    Order Specific Question:   Preferred imaging location?    Answer:   [facility]  . CBC with Differential/Platelet    Standing Status:   Future    Standing Expiration Date:   05/04/2020  . Comprehensive metabolic panel    Standing Status:   Future    Standing Expiration Date:   05/04/2020  . Lactate dehydrogenase    Standing Status:  Future    Standing Expiration Date:   05/04/2020  . Ferritin    Standing Status:   Future    Standing Expiration Date:   05/04/2020       Zoila Shutter MD

## 2018-05-31 ENCOUNTER — Encounter: Payer: Self-pay | Admitting: Physician Assistant

## 2018-05-31 ENCOUNTER — Ambulatory Visit: Payer: Self-pay | Admitting: Physician Assistant

## 2018-05-31 VITALS — BP 126/76 | HR 80 | Temp 97.7°F | Ht 62.0 in | Wt 146.0 lb

## 2018-05-31 DIAGNOSIS — Z13220 Encounter for screening for lipoid disorders: Secondary | ICD-10-CM

## 2018-05-31 DIAGNOSIS — Z131 Encounter for screening for diabetes mellitus: Secondary | ICD-10-CM

## 2018-05-31 DIAGNOSIS — R7309 Other abnormal glucose: Secondary | ICD-10-CM

## 2018-05-31 DIAGNOSIS — Z853 Personal history of malignant neoplasm of breast: Secondary | ICD-10-CM

## 2018-05-31 NOTE — Progress Notes (Signed)
BP 126/76 (BP Location: Right Arm, Patient Position: Sitting, Cuff Size: Normal)   Pulse 80   Temp 97.7 F (36.5 C)   Ht 5' 2"  (1.575 m)   Wt 146 lb (66.2 kg)   SpO2 98%   BMI 26.70 kg/m    Subjective:    Patient ID: Michele Salas, female    DOB: [birth year], 37 y.o.   MRN: [id number]  HPI: Michele Salas is a 37 y.o. female presenting on 05/31/2018 for Follow-up   HPI   Pt had reconstructive surgery and has f/u with oncology in March.    She says she is doing well.   She has no complaints  Relevant past medical, surgical, family and social history reviewed and updated as indicated. Interim medical history since our last visit reviewed. Allergies and medications reviewed and updated.   Current Outpatient Medications:  .  ferrous sulfate 325 (65 FE) MG tablet, Take 325 mg by mouth daily with breakfast., Disp: , Rfl:  .  ibuprofen (ADVIL,MOTRIN) 800 MG tablet, Take 800 mg by mouth every 8 (eight) hours as needed for cramping., Disp: , Rfl:    Review of Systems  Constitutional: Positive for diaphoresis and unexpected weight change. Negative for appetite change, chills, fatigue and fever.  HENT: Positive for sneezing. Negative for congestion, dental problem, drooling, ear pain, facial swelling, hearing loss, mouth sores, sore throat, trouble swallowing and voice change.   Eyes: Negative for pain, discharge, redness, itching and visual disturbance.  Respiratory: Negative for cough, choking, shortness of breath and wheezing.   Cardiovascular: Negative for chest pain, palpitations and leg swelling.  Gastrointestinal: Negative for abdominal pain, blood in stool, constipation, diarrhea and vomiting.  Endocrine: Negative for cold intolerance, heat intolerance and polydipsia.  Genitourinary: Negative for decreased urine volume, dysuria and hematuria.  Musculoskeletal: Positive for arthralgias and back pain. Negative for gait problem.  Skin: Negative for rash.   Allergic/Immunologic: Positive for environmental allergies.  Neurological: Negative for seizures, syncope, light-headedness and headaches.  Hematological: Negative for adenopathy.  Psychiatric/Behavioral: Negative for agitation, dysphoric mood and suicidal ideas. The patient is nervous/anxious.     Per HPI unless specifically indicated above     Objective:    BP 126/76 (BP Location: Right Arm, Patient Position: Sitting, Cuff Size: Normal)   Pulse 80   Temp 97.7 F (36.5 C)   Ht 5' 2"  (1.575 m)   Wt 146 lb (66.2 kg)   SpO2 98%   BMI 26.70 kg/m   Wt Readings from Last 3 Encounters:  05/31/18 146 lb (66.2 kg)  05/04/18 (P) 147 lb 4.8 oz (66.8 kg)  12/22/17 157 lb (71.2 kg)    Physical Exam  Constitutional: She is oriented to person, place, and time. She appears well-developed and well-nourished.  HENT:  Head: Normocephalic and atraumatic.  Neck: Neck supple.  Cardiovascular: Normal rate and regular rhythm.  Pulmonary/Chest: Effort normal and breath sounds normal.  Abdominal: Soft. Bowel sounds are normal. She exhibits no mass. There is no hepatosplenomegaly. There is no tenderness.  Musculoskeletal: She exhibits no edema.  Lymphadenopathy:    She has no cervical adenopathy.  Neurological: She is alert and oriented to person, place, and time.  Skin: Skin is warm and dry.  Psychiatric: She has a normal mood and affect. Her behavior is normal.  Vitals reviewed.   Results for orders placed or performed in visit on 04/27/18  CBC with Differential/Platelet  Result Value Ref Range   WBC 7.3 4.0 - 10.5 K/uL  RBC 4.78 3.87 - 5.11 MIL/uL   Hemoglobin 14.3 12.0 - 15.0 g/dL   HCT 41.4 36.0 - 46.0 %   MCV 86.6 78.0 - 100.0 fL   MCH 29.9 26.0 - 34.0 pg   MCHC 34.5 30.0 - 36.0 g/dL   RDW 13.2 11.5 - 15.5 %   Platelets 318 150 - 400 K/uL   Neutrophils Relative % 59 %   Neutro Abs 4.2 1.7 - 7.7 K/uL   Lymphocytes Relative 31 %   Lymphs Abs 2.3 0.7 - 4.0 K/uL   Monocytes  Relative 8 %   Monocytes Absolute 0.6 0.1 - 1.0 K/uL   Eosinophils Relative 2 %   Eosinophils Absolute 0.2 0.0 - 0.7 K/uL   Basophils Relative 0 %   Basophils Absolute 0.0 0.0 - 0.1 K/uL  Comprehensive metabolic panel  Result Value Ref Range   Sodium 140 135 - 145 mmol/L   Potassium 3.7 3.5 - 5.1 mmol/L   Chloride 107 98 - 111 mmol/L   CO2 29 22 - 32 mmol/L   Glucose, Bld 125 (H) 70 - 99 mg/dL   BUN 8 6 - 20 mg/dL   Creatinine, Ser 0.53 0.44 - 1.00 mg/dL   Calcium 9.3 8.9 - 10.3 mg/dL   Total Protein 7.7 6.5 - 8.1 g/dL   Albumin 4.4 3.5 - 5.0 g/dL   AST 28 15 - 41 U/L   ALT 37 0 - 44 U/L   Alkaline Phosphatase 105 38 - 126 U/L   Total Bilirubin 0.8 0.3 - 1.2 mg/dL   GFR calc non Af Amer >60 >60 mL/min   GFR calc Af Amer >60 >60 mL/min   Anion gap 4 (L) 5 - 15  Ferritin  Result Value Ref Range   Ferritin 17 11 - 307 ng/mL  Lactate dehydrogenase  Result Value Ref Range   LDH 153 98 - 192 U/L  Hemoglobinopathy evaluation  Result Value Ref Range   Hgb A2 Quant 2.1 1.8 - 3.2 %   Hgb F Quant 0.0 0.0 - 2.0 %   Hgb S Quant 0.0 0.0 %   Hgb C 0.0 0.0 %   Hgb A 97.9 96.4 - 98.8 %   Hgb Variant 0.0 0.0 %   Please Note: Comment       Assessment & Plan:   Encounter Diagnoses  Name Primary?  . Screening cholesterol level Yes  . Elevated glucose   . Screening for diabetes mellitus   . History of breast cancer     -reviewed labs with pt (that were ordered by Dr Walden Field) -will Check lipids and a1c and call results -pt encouraged to Continue iron -pt to follow up with oncology per their recomendation -pt to follow up here 1 year.  RTO sooner prn

## 2018-06-04 ENCOUNTER — Other Ambulatory Visit (HOSPITAL_COMMUNITY)
Admission: RE | Admit: 2018-06-04 | Discharge: 2018-06-04 | Disposition: A | Discharge status: Home / Self Care | Payer: Self-pay | Source: Ambulatory Visit | Attending: Physician Assistant | Admitting: Physician Assistant

## 2018-06-04 DIAGNOSIS — Z13220 Encounter for screening for lipoid disorders: Secondary | ICD-10-CM | POA: Insufficient documentation

## 2018-06-04 DIAGNOSIS — Z131 Encounter for screening for diabetes mellitus: Secondary | ICD-10-CM | POA: Insufficient documentation

## 2018-06-04 DIAGNOSIS — R7309 Other abnormal glucose: Secondary | ICD-10-CM | POA: Insufficient documentation

## 2018-06-04 LAB — HEMOGLOBIN A1C
Hgb A1c MFr Bld: 5.2 % (ref 4.8–5.6)
Mean Plasma Glucose: 102.54 mg/dL

## 2018-06-04 LAB — LIPID PANEL
Cholesterol: 167 mg/dL (ref 0–200)
HDL: 40 mg/dL — ABNORMAL LOW (ref >40)
LDL Cholesterol: 108 mg/dL — ABNORMAL HIGH (ref 0–99)
Total CHOL/HDL Ratio: 4.2 RATIO
Triglycerides: 94 mg/dL (ref <150)
VLDL: 19 mg/dL (ref 0–40)

## 2018-10-24 ENCOUNTER — Ambulatory Visit (HOSPITAL_COMMUNITY): Payer: Self-pay

## 2018-10-25 ENCOUNTER — Ambulatory Visit (HOSPITAL_COMMUNITY)
Admission: RE | Admit: 2018-10-25 | Discharge: 2018-10-25 | Disposition: A | Discharge status: Home / Self Care | Payer: Self-pay | Source: Ambulatory Visit | Attending: Internal Medicine | Admitting: Internal Medicine

## 2018-10-25 ENCOUNTER — Other Ambulatory Visit: Payer: Self-pay

## 2018-10-25 DIAGNOSIS — C50112 Malignant neoplasm of central portion of left female breast: Secondary | ICD-10-CM | POA: Insufficient documentation

## 2018-10-25 DIAGNOSIS — Z1231 Encounter for screening mammogram for malignant neoplasm of breast: Secondary | ICD-10-CM | POA: Insufficient documentation

## 2018-10-25 DIAGNOSIS — Z171 Estrogen receptor negative status [ER-]: Secondary | ICD-10-CM | POA: Insufficient documentation

## 2018-10-26 ENCOUNTER — Inpatient Hospital Stay (HOSPITAL_COMMUNITY): Payer: Self-pay | Attending: Hematology

## 2018-10-26 ENCOUNTER — Ambulatory Visit (HOSPITAL_COMMUNITY): Payer: Self-pay

## 2018-10-26 ENCOUNTER — Encounter (HOSPITAL_COMMUNITY): Payer: Self-pay

## 2018-10-26 ENCOUNTER — Other Ambulatory Visit: Payer: Self-pay

## 2018-10-26 DIAGNOSIS — Z853 Personal history of malignant neoplasm of breast: Secondary | ICD-10-CM | POA: Insufficient documentation

## 2018-10-26 DIAGNOSIS — Z923 Personal history of irradiation: Secondary | ICD-10-CM | POA: Insufficient documentation

## 2018-10-26 DIAGNOSIS — Z9221 Personal history of antineoplastic chemotherapy: Secondary | ICD-10-CM | POA: Insufficient documentation

## 2018-10-26 DIAGNOSIS — Z9012 Acquired absence of left breast and nipple: Secondary | ICD-10-CM | POA: Insufficient documentation

## 2018-10-26 DIAGNOSIS — D509 Iron deficiency anemia, unspecified: Secondary | ICD-10-CM | POA: Insufficient documentation

## 2018-10-26 DIAGNOSIS — R7989 Other specified abnormal findings of blood chemistry: Secondary | ICD-10-CM | POA: Insufficient documentation

## 2018-10-26 LAB — COMPREHENSIVE METABOLIC PANEL
ALT: 22 U/L (ref 0–44)
AST: 23 U/L (ref 15–41)
Albumin: 4.5 g/dL (ref 3.5–5.0)
Alkaline Phosphatase: 87 U/L (ref 38–126)
Anion gap: 9 (ref 5–15)
BUN: 12 mg/dL (ref 6–20)
CO2: 22 mmol/L (ref 22–32)
Calcium: 9.2 mg/dL (ref 8.9–10.3)
Chloride: 106 mmol/L (ref 98–111)
Creatinine, Ser: 0.61 mg/dL (ref 0.44–1.00)
GFR calc Af Amer: >60 mL/min (ref >60)
GFR calc non Af Amer: >60 mL/min (ref >60)
Glucose, Bld: 87 mg/dL (ref 70–99)
Potassium: 3.8 mmol/L (ref 3.5–5.1)
Sodium: 137 mmol/L (ref 135–145)
Total Bilirubin: 0.7 mg/dL (ref 0.3–1.2)
Total Protein: 7.5 g/dL (ref 6.5–8.1)

## 2018-10-26 LAB — FERRITIN: Ferritin: 14 ng/mL (ref 11–307)

## 2018-10-26 LAB — CBC WITH DIFFERENTIAL/PLATELET
Abs Immature Granulocytes: 0.03 10*3/uL (ref 0.00–0.07)
Basophils Absolute: 0 10*3/uL (ref 0.0–0.1)
Basophils Relative: 0 %
Eosinophils Absolute: 0.1 10*3/uL (ref 0.0–0.5)
Eosinophils Relative: 1 %
HCT: 39.6 % (ref 36.0–46.0)
Hemoglobin: 13.2 g/dL (ref 12.0–15.0)
Immature Granulocytes: 0 %
Lymphocytes Relative: 23 %
Lymphs Abs: 2.1 10*3/uL (ref 0.7–4.0)
MCH: 29.5 pg (ref 26.0–34.0)
MCHC: 33.3 g/dL (ref 30.0–36.0)
MCV: 88.4 fL (ref 80.0–100.0)
Monocytes Absolute: 0.5 10*3/uL (ref 0.1–1.0)
Monocytes Relative: 6 %
Neutro Abs: 6.3 10*3/uL (ref 1.7–7.7)
Neutrophils Relative %: 70 %
Platelets: 309 10*3/uL (ref 150–400)
RBC: 4.48 MIL/uL (ref 3.87–5.11)
RDW: 13 % (ref 11.5–15.5)
WBC: 9.1 10*3/uL (ref 4.0–10.5)
nRBC: 0 % (ref 0.0–0.2)

## 2018-10-26 LAB — LACTATE DEHYDROGENASE: LDH: 146 U/L (ref 98–192)

## 2018-10-29 ENCOUNTER — Other Ambulatory Visit (HOSPITAL_COMMUNITY): Payer: Self-pay | Admitting: Internal Medicine

## 2018-10-29 DIAGNOSIS — R928 Other abnormal and inconclusive findings on diagnostic imaging of breast: Secondary | ICD-10-CM

## 2018-11-02 ENCOUNTER — Inpatient Hospital Stay (HOSPITAL_BASED_OUTPATIENT_CLINIC_OR_DEPARTMENT_OTHER): Payer: Self-pay | Admitting: Nurse Practitioner

## 2018-11-02 ENCOUNTER — Other Ambulatory Visit (HOSPITAL_COMMUNITY): Payer: Self-pay | Admitting: Nurse Practitioner

## 2018-11-02 ENCOUNTER — Other Ambulatory Visit: Payer: Self-pay

## 2018-11-02 DIAGNOSIS — R7989 Other specified abnormal findings of blood chemistry: Secondary | ICD-10-CM

## 2018-11-02 DIAGNOSIS — C50112 Malignant neoplasm of central portion of left female breast: Secondary | ICD-10-CM

## 2018-11-02 DIAGNOSIS — Z171 Estrogen receptor negative status [ER-]: Secondary | ICD-10-CM

## 2018-11-02 DIAGNOSIS — Z9221 Personal history of antineoplastic chemotherapy: Secondary | ICD-10-CM

## 2018-11-02 DIAGNOSIS — D509 Iron deficiency anemia, unspecified: Secondary | ICD-10-CM

## 2018-11-02 DIAGNOSIS — Z923 Personal history of irradiation: Secondary | ICD-10-CM

## 2018-11-02 DIAGNOSIS — Z853 Personal history of malignant neoplasm of breast: Secondary | ICD-10-CM

## 2018-11-02 DIAGNOSIS — Z9012 Acquired absence of left breast and nipple: Secondary | ICD-10-CM

## 2018-11-02 NOTE — Patient Instructions (Signed)
[place] at [facility] Discharge Instructions  Follow up after your mammogram.    Thank you for choosing [place] at [facility] to provide your oncology and hematology care.  To afford each patient quality time with our provider, please arrive at least 15 minutes before your scheduled appointment time.   If you have a lab appointment with the [place] please come in thru the  Main Entrance and check in at the main information desk  You need to re-schedule your appointment should you arrive 10 or more minutes late.  We strive to give you quality time with our providers, and arriving late affects you and other patients whose appointments are after yours.  Also, if you no show three or more times for appointments you may be dismissed from the clinic at the providers discretion.     Again, thank you for choosing [facility].  Our hope is that these requests will decrease the amount of time that you wait before being seen by our physicians.       _____________________________________________________________  Should you have questions after your visit to [facility], please contact our office at [phone] between the hours of 8:00 a.m. and 4:30 p.m.  Voicemails left after 4:00 p.m. will not be returned until the following business day.  For prescription refill requests, have your pharmacy contact our office and allow 72 hours.    Cancer Center Support Programs:   > Cancer Support Group  2nd Tuesday of the month 1pm-2pm, Journey Room

## 2018-11-02 NOTE — Progress Notes (Signed)
[place], [place] [address]   CLINIC:  Medical Oncology/Hematology  PCP:  [name], PA-C [place] [address] [phone]   REASON FOR VISIT: Follow-up for triple negative left breast cancer.  New right breast mass  CURRENT THERAPY: Work-up for new right breast mass  BRIEF ONCOLOGIC HISTORY:    Cancer of central portion of left female breast ([place])   09/16/2015 Mammogram    1.6 cm retroareolar left breast mass measuring 1.6 cm in largest diameter in addition to 3 abnormal appearing left inferior axillary lymph nodes.    09/23/2015 Initial Biopsy    US guided core biopsy of left breast mass and suspicious left axillary lymph nodes.    09/23/2015 Pathology Results    Invasive ductal carcinoma, high grade.  Left axilla lymph node is positive for metastatic carcinoma.  ER/PR NEGATIVE, HER2 NEGATIVE, KI-67 marker of 85%.    10/26/2015 Imaging    Bone scan- No evidence of metastatic disease to the skeleton.    10/27/2015 Imaging    CT CAP-  Retroareolar mass with 2 asymmetrically prominent left axillary lymph nodes and an upper normal size left subpectoral lymph node. No other findings of metastatic disease in the chest, abdomen, or pelvis.    11/05/2015 Echocardiogram    The estimated ejection fraction was in the range of 60% to 65%.    11/23/2015 Miscellaneous    Genetic Counseling- genetic test result was negative for any known pathogenic mutations within any of 28 genes that would cause her to be at an increased genetic risk for breast, ovarian, or other related cancers.    12/09/2015 Surgery    L radical mastectomy, axillary LN dissection, port a cath insertion with Dr. Aviva Signs 12/09/2015    12/11/2015 Pathology Results    - INVASIVE GRADE III DUCTAL CARCINOMA, SPANNING 2.3 CM IN GREATEST DIMENSION. - ASSOCIATED HIGH GRADE DCIS - MARGINS ARE NEGATIVE. 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  - EIGHT ADDITIONAL LYMPH NODES WITH NO TUMOR SEEN    12/11/2015 Pathology Results    HER2 NEGATIVE, ER 0%, PR 0%, Ki-67 marker 90%    12/25/2015 - 02/12/2016 Chemotherapy    AC every 2 weeks x 4 cycles    02/04/2016 Treatment Plan Change    Treatment deferred x 7 days due to neutropenia    02/25/2016 - 05/12/2016 Chemotherapy    Paclitaxel weekly x 12    06/01/2016 - 07/19/2016 Radiation Therapy    33 fractions, Dr. Lisbeth Renshaw in [place]    02/06/2017 Surgery    (L) breast reconstruction (lat flap with tissue expander) with Dr. Iran Planas      CANCER STAGING: Cancer Staging Cancer of central portion of left female breast [facility]) Staging form: Breast, AJCC 7th Edition - Pathologic stage from 12/11/2015: Stage IIB (T2, N1a, cM0) - Signed by Baird Cancer, PA-C on 12/31/2015    INTERVAL HISTORY:  Ms. Gow 38 y.o. female returns for routine follow-up for triple negative left breast cancer.  Her last mammogram was recently done.  She is here today alone without the interpreter.  We called the 1 800 line for the interpreter and he was on speaker phone for our entire visit.  Patient denies any pain or new lumps felt.  Mammogram results had not been reviewed with patient nor has a follow-up mammogram or ultrasound been set up.  Patient wished for [place] to go ahead and set all that up.  Denies any nausea, vomiting, or diarrhea. Denies any new pains. Had not noticed any recent bleeding such as epistaxis, hematuria or hematochezia. Denies recent chest pain on exertion, shortness of breath on minimal exertion, pre-syncopal episodes, or palpitations. Denies any numbness or tingling in hands or feet. Denies any recent fevers, infections, or recent hospitalizations. Patient reports appetite at 100% and energy level at 100%.  She is eating well and maintaining her weight at this time.   REVIEW OF SYSTEMS:  Review of Systems  All other systems reviewed and are negative.    PAST MEDICAL/SURGICAL HISTORY:  Past  Medical History:  Diagnosis Date   History of breast cancer 10/2015   left   History of chemotherapy 2017   History of radiation therapy    PONV (postoperative nausea and vomiting)    nausea only   Past Surgical History:  Procedure Laterality Date   BREAST ENHANCEMENT SURGERY Right 05/19/2017   Procedure: Augmentation Right Breast with Saline Implant for symmetry;  Surgeon: Irene Limbo, MD;  Location: [place];  Service: Plastics;  Laterality: Right;   LATISSIMUS FLAP TO BREAST Left 02/06/2017   Procedure: LEFT LATISSIMUS FLAP TO BREAST;  Surgeon: Irene Limbo, MD;  Location: [place];  Service: Plastics;  Laterality: Left;   MASTECTOMY MODIFIED RADICAL Left 12/09/2015   Procedure: LEFT MODIFIED RADICAL MASTECTOMY;  Surgeon: Aviva Signs, MD;  Location: [facility];  Service: General;  Laterality: Left;   MASTOPEXY Right 12/22/2017   Procedure: RIGHT MASTOPEXY;  Surgeon: Irene Limbo, MD;  Location: [place];  Service: Plastics;  Laterality: Right;   PORT-A-CATH REMOVAL Right 05/19/2017   Procedure: REMOVAL PORT-A-CATH;  Surgeon: Irene Limbo, MD;  Location: [place];  Service: Plastics;  Laterality: Right;   PORTACATH PLACEMENT Right 12/09/2015   Procedure: INSERTION PORT-A-CATH RIGHT SUBCLAVIAN;  Surgeon: Aviva Signs, MD;  Location: [facility];  Service: General;  Laterality: Right;   REMOVAL OF TISSUE EXPANDER AND PLACEMENT OF IMPLANT Left 05/19/2017   Procedure: REMOVAL OF LEFT TISSUE EXPANDER AND PLACEMENT OF SALINE IMPLANT;  Surgeon: Irene Limbo, MD;  Location: [place];  Service: Plastics;  Laterality: Left;   SKIN FULL THICKNESS GRAFT Left 12/22/2017   Procedure: LEFT NIPPLE AREOLAR COMPLEX RECONSTRUCTION WITH LOCAL FLAP  AND FULL THICKNESS SKIN GRAFT FROM LEFT GROIN;  Surgeon: Irene Limbo, MD;  Location: [place];  Service: Plastics;  Laterality: Left;   TISSUE EXPANDER  PLACEMENT Left 02/06/2017   Procedure: TISSUE EXPANDER PLACEMENT LEFT BREAST;  Surgeon: Irene Limbo, MD;  Location: [place];  Service: Plastics;  Laterality: Left;     SOCIAL HISTORY:  Social History   Socioeconomic History   Marital status: Married    Spouse name: Not on file   Number of children: Not on file   Years of education: Not on file   Highest education level: Not on file  Occupational History   Not on file  Social Needs   Financial resource strain: Not on file   Food insecurity:    Worry: Not on file    Inability: Not on file   Transportation needs:    Medical: Not on file    Non-medical: Not on file  Tobacco Use   Smoking status: Never Smoker   Smokeless tobacco: Never Used  Substance and Sexual Activity   Alcohol use: No   Drug use: No   Sexual activity: Yes    Birth control/protection: Condom  Lifestyle   Physical activity:  Days per week: Not on file    Minutes per session: Not on file   Stress: Not on file  Relationships   Social connections:    Talks on phone: Not on file    Gets together: Not on file    Attends religious service: Not on file    Active member of club or organization: Not on file    Attends meetings of clubs or organizations: Not on file    Relationship status: Not on file   Intimate partner violence:    Fear of current or ex partner: Not on file    Emotionally abused: Not on file    Physically abused: Not on file    Forced sexual activity: Not on file  Other Topics Concern   Not on file  Social History Narrative   Not on file    FAMILY HISTORY:  Family History  Problem Relation Age of Onset   Diabetes Mother    Heart attack Paternal Uncle    Heart attack Paternal Grandfather 86    CURRENT MEDICATIONS:  Outpatient Encounter Medications as of 11/02/2018  Medication Sig   ferrous sulfate 325 (65 FE) MG tablet Take 325 mg by mouth daily with breakfast.   ibuprofen (ADVIL,MOTRIN) 800 MG tablet  Take 800 mg by mouth every 8 (eight) hours as needed for cramping.   No facility-administered encounter medications on file as of 11/02/2018.     ALLERGIES:  No Known Allergies   PHYSICAL EXAM:  ECOG Performance status: 1  Vitals:   11/02/18 0930  BP: 132/69  Pulse: 69  Resp: 16  Temp: 98 F (36.7 C)  SpO2: 100%   Filed Weights   11/02/18 0930  Weight: 147 lb (66.7 kg)    Physical Exam Constitutional:      Appearance: Normal appearance. She is normal weight.  Cardiovascular:     Rate and Rhythm: Normal rate and regular rhythm.     Heart sounds: Normal heart sounds.  Pulmonary:     Effort: Pulmonary effort is normal.     Breath sounds: Normal breath sounds.  Musculoskeletal: Normal range of motion.  Skin:    General: Skin is warm and dry.  Neurological:     Mental Status: She is alert and oriented to person, place, and time. Mental status is at baseline.  Psychiatric:        Mood and Affect: Mood normal.        Behavior: Behavior normal.        Thought Content: Thought content normal.        Judgment: Judgment normal.   Breast:  LEFT: Has implant. No palpable masses, no skin changes or nipple discharge, no adenopathy.  RIGHT: Also has an implant. She has scar tissue from implant surgery felt inferior to nipple. No palpable masses or nipple discharge, no adenopathy.    LABORATORY DATA:  I have reviewed the labs as listed.  CBC    Component Value Date/Time   WBC 9.1 10/26/2018 1550   RBC 4.48 10/26/2018 1550   HGB 13.2 10/26/2018 1550   HCT 39.6 10/26/2018 1550   PLT 309 10/26/2018 1550   MCV 88.4 10/26/2018 1550   MCH 29.5 10/26/2018 1550   MCHC 33.3 10/26/2018 1550   RDW 13.0 10/26/2018 1550   LYMPHSABS 2.1 10/26/2018 1550   MONOABS 0.5 10/26/2018 1550   EOSABS 0.1 10/26/2018 1550   BASOSABS 0.0 10/26/2018 1550   CMP Latest Ref Rng & Units 10/26/2018 04/27/2018 10/27/2017  Glucose  70 - 99 mg/dL 87 125(H) 115(H)  BUN 6 - 20 mg/dL 12 8 9   Creatinine  0.44 - 1.00 mg/dL 0.61 0.53 0.58  Sodium 135 - 145 mmol/L 137 140 138  Potassium 3.5 - 5.1 mmol/L 3.8 3.7 3.8  Chloride 98 - 111 mmol/L 106 107 103  CO2 22 - 32 mmol/L 22 29 24   Calcium 8.9 - 10.3 mg/dL 9.2 9.3 9.5  Total Protein 6.5 - 8.1 g/dL 7.5 7.7 7.9  Total Bilirubin 0.3 - 1.2 mg/dL 0.7 0.8 0.7  Alkaline Phos 38 - 126 U/L 87 105 119  AST 15 - 41 U/L 23 28 65(H)  ALT 0 - 44 U/L 22 37 100(H)       DIAGNOSTIC IMAGING:  I have independently reviewed the mammogram results and discussed with the patient.    I personally performed a face-to-face visit.    ASSESSMENT & PLAN:   Cancer of central portion of left female breast ([place]) 1.  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  She had a mammogram performed 09/16/2015 that showed a retroareolar left breast mass measuring 1.6 cm with 3 abnormal left axillary lymph nodes noted. - She had a biopsy 09/23/2015 of the left breast mass as well as a left axillary lymph node. - Pathology reported invasive ductal carcinoma high-grade left axillary lymph node was positive for metastatic cancer. ER-/PR-/HER2- Ki-67 was 85%. - Bone scan on 10/26/2015 that was negative. -CT CAP was done 10/27/2015 showed the left breast mass and left axillary lymph nodes with no other evidence of metastatic disease. - Dr. Arnoldo Morale performed a left mastectomy and axillary lymph node dissection on 12/11/2015. - Pathology reported invasive ductal grade 3 measuring 2.3 cm.  With 2 out of 10 lymph nodes positive for malignancy. ER-/PR-/HER2- -She was treated with 4 cycles of AC followed by 12 weeks of Taxol which was completed October 2017. -She was also treated with radiation 33 fractions which was completed in December 2017. - Patient had her yearly screening mammogram on 10/25/2018 which showed BI-RADS Category 0 incomplete needs further evaluation.  Further evaluation needed for possible mass in the right breast. -  She had not yet been scheduled for follow-up.  So I scheduled her for a 3D diagnostic mammogram with implant unilateral right with ultrasound of the right breast and axillary. - She will follow-up after her mammogram and ultrasound.  2.  Iron deficiency anemia: - Labs done on 10/26/2018 showed her hemoglobin 13.2 and her ferritin 14. - Patient wishes to continue on her oral iron supplements at this time. -Patient does have a history of menorrhagia and she is followed by GYN. - We will repeat labs in 3 months.  3.  Abnormal liver function tests: - Patient had CT of abdomen and pelvis done on 10/05/2016 showed a fatty liver. - She then had a liver biopsy done 10/24/2016 which was also consistent with fatty liver. - Been following her LFTs.  Labs done on 10/26/2018 showed normal LFTs.  4.  Health maintenance: - Patient had right breast skin biopsy done on 12/22/2017 that was benign. -Patient had a pelvic ultrasound due to her menorrhagia on 10/24/2017 which was negative.      Orders placed this encounter:  Orders Placed This Encounter  Procedures   MM DIAG BREAST TOMO UNI RIGHT   US Breast Complete Uni Right Inc Axilla   MM DIAG BREAST W/IMPLANT TOMO UNI R  Francene Finders FNP-C [place] [phone]

## 2018-11-02 NOTE — Assessment & Plan Note (Addendum)
1.  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  She had a mammogram performed 09/16/2015 that showed a retroareolar left breast mass measuring 1.6 cm with 3 abnormal left axillary lymph nodes noted. - She had a biopsy 09/23/2015 of the left breast mass as well as a left axillary lymph node. - Pathology reported invasive ductal carcinoma high-grade left axillary lymph node was positive for metastatic cancer. ER-/PR-/HER2- Ki-67 was 85%. - Bone scan on 10/26/2015 that was negative. -CT CAP was done 10/27/2015 showed the left breast mass and left axillary lymph nodes with no other evidence of metastatic disease. - Dr. Arnoldo Morale performed a left mastectomy and axillary lymph node dissection on 12/11/2015. - Pathology reported invasive ductal grade 3 measuring 2.3 cm.  With 2 out of 10 lymph nodes positive for malignancy. ER-/PR-/HER2- -She was treated with 4 cycles of AC followed by 12 weeks of Taxol which was completed October 2017. -She was also treated with radiation 33 fractions which was completed in December 2017. - Patient had her yearly screening mammogram on 10/25/2018 which showed BI-RADS Category 0 incomplete needs further evaluation.  Further evaluation needed for possible mass in the right breast. - She had not yet been scheduled for follow-up.  So I scheduled her for a 3D diagnostic mammogram with implant unilateral right with ultrasound of the right breast and axillary. - She will follow-up after her mammogram and ultrasound.  2.  Iron deficiency anemia: - Labs done on 10/26/2018 showed her hemoglobin 13.2 and her ferritin 14. - Patient wishes to continue on her oral iron supplements at this time. -Patient does have a history of menorrhagia and she is followed by GYN. - We will repeat labs in 3 months.  3.  Abnormal liver function tests: - Patient had CT of abdomen and pelvis done on 10/05/2016 showed a fatty liver. - She then had a liver  biopsy done 10/24/2016 which was also consistent with fatty liver. - Been following her LFTs.  Labs done on 10/26/2018 showed normal LFTs.  4.  Health maintenance: - Patient had right breast skin biopsy done on 12/22/2017 that was benign. -Patient had a pelvic ultrasound due to her menorrhagia on 10/24/2017 which was negative.

## 2018-11-27 ENCOUNTER — Ambulatory Visit (HOSPITAL_COMMUNITY)
Admission: RE | Admit: 2018-11-27 | Discharge: 2018-11-27 | Disposition: A | Discharge status: Home / Self Care | Payer: Self-pay | Source: Ambulatory Visit | Attending: Nurse Practitioner | Admitting: Nurse Practitioner

## 2018-11-27 ENCOUNTER — Encounter (HOSPITAL_COMMUNITY): Payer: Self-pay

## 2018-11-27 ENCOUNTER — Other Ambulatory Visit: Payer: Self-pay

## 2018-11-27 DIAGNOSIS — C50112 Malignant neoplasm of central portion of left female breast: Secondary | ICD-10-CM

## 2018-11-27 DIAGNOSIS — Z171 Estrogen receptor negative status [ER-]: Secondary | ICD-10-CM | POA: Insufficient documentation

## 2018-11-28 ENCOUNTER — Inpatient Hospital Stay (HOSPITAL_COMMUNITY): Payer: Self-pay | Attending: Nurse Practitioner | Admitting: Nurse Practitioner

## 2018-11-28 ENCOUNTER — Encounter (HOSPITAL_COMMUNITY): Payer: Self-pay | Admitting: Nurse Practitioner

## 2018-11-28 ENCOUNTER — Other Ambulatory Visit: Payer: Self-pay

## 2018-11-28 DIAGNOSIS — Z923 Personal history of irradiation: Secondary | ICD-10-CM | POA: Insufficient documentation

## 2018-11-28 DIAGNOSIS — Z171 Estrogen receptor negative status [ER-]: Secondary | ICD-10-CM | POA: Insufficient documentation

## 2018-11-28 DIAGNOSIS — Z9221 Personal history of antineoplastic chemotherapy: Secondary | ICD-10-CM | POA: Insufficient documentation

## 2018-11-28 DIAGNOSIS — Z833 Family history of diabetes mellitus: Secondary | ICD-10-CM | POA: Insufficient documentation

## 2018-11-28 DIAGNOSIS — K760 Fatty (change of) liver, not elsewhere classified: Secondary | ICD-10-CM | POA: Insufficient documentation

## 2018-11-28 DIAGNOSIS — D500 Iron deficiency anemia secondary to blood loss (chronic): Secondary | ICD-10-CM | POA: Insufficient documentation

## 2018-11-28 DIAGNOSIS — N920 Excessive and frequent menstruation with regular cycle: Secondary | ICD-10-CM | POA: Insufficient documentation

## 2018-11-28 DIAGNOSIS — Z8249 Family history of ischemic heart disease and other diseases of the circulatory system: Secondary | ICD-10-CM | POA: Insufficient documentation

## 2018-11-28 DIAGNOSIS — C50112 Malignant neoplasm of central portion of left female breast: Secondary | ICD-10-CM

## 2018-11-28 DIAGNOSIS — Z853 Personal history of malignant neoplasm of breast: Secondary | ICD-10-CM | POA: Insufficient documentation

## 2018-11-28 DIAGNOSIS — Z791 Long term (current) use of non-steroidal anti-inflammatories (NSAID): Secondary | ICD-10-CM | POA: Insufficient documentation

## 2018-11-28 NOTE — Assessment & Plan Note (Signed)
1.  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  She had a mammogram performed 09/16/2015 that showed a retroareolar left breast mass measuring 1.6 cm with 3 abnormal left axillary lymph nodes noted. - She had a biopsy 09/23/2015 of the left breast mass as well as a left axillary lymph node. - Pathology reported invasive ductal carcinoma high-grade left axillary lymph node was positive for metastatic cancer. ER-/PR-/HER2- Ki-67 was 85%. - Bone scan on 10/26/2015 that was negative. -CT CAP was done 10/27/2015 showed the left breast mass and left axillary lymph nodes with no other evidence of metastatic disease. - Dr. Arnoldo Morale performed a left mastectomy and axillary lymph node dissection on 12/11/2015. - Pathology reported invasive ductal grade 3 measuring 2.3 cm.  With 2 out of 10 lymph nodes positive for malignancy. ER-/PR-/HER2- -She was treated with 4 cycles of AC followed by 12 weeks of Taxol which was completed October 2017. -She was also treated with radiation 33 fractions which was completed in December 2017. - Patient had her yearly screening mammogram on 10/25/2018 which showed BI-RADS Category 0 incomplete needs further evaluation.  Further evaluation needed for possible mass in the right breast. - Patient had her right mammogram follow-up on 11/27/2018 which showed B RADS category 2 benign.  We reviewed the results of her mammogram today and she will now need a repeat in 1 year. - Patient will follow-up in 6 months with labs.  2.  Iron deficiency anemia: - Labs done on 10/26/2018 showed her hemoglobin 13.2 and her ferritin 14. - Patient wishes to continue on her oral iron supplements at this time. -Patient does have a history of menorrhagia and she is followed by GYN. - We will repeat labs in 6 months.  3.  Abnormal liver function tests: - Patient had CT of abdomen and pelvis done on 10/05/2016 showed a fatty liver. - She then had a liver  biopsy done 10/24/2016 which was also consistent with fatty liver. - Been following her LFTs.  Labs done on 10/26/2018 showed normal LFTs. - We will repeat her labs in 6 months with her follow-up appointment.  4.  Health maintenance: - Patient had right breast skin biopsy done on 12/22/2017 that was benign. -Patient had a pelvic ultrasound due to her menorrhagia on 10/24/2017 which was negative.

## 2018-11-28 NOTE — Patient Instructions (Signed)
[place] at [facility] Discharge Instructions  Follow-up in 6 months with repeat labs.   Thank you for choosing [place] at [facility] to provide your oncology and hematology care.  To afford each patient quality time with our provider, please arrive at least 15 minutes before your scheduled appointment time.   If you have a lab appointment with the [facility] please come in thru the  Main Entrance and check in at the main information desk  You need to re-schedule your appointment should you arrive 10 or more minutes late.  We strive to give you quality time with our providers, and arriving late affects you and other patients whose appointments are after yours.  Also, if you no show three or more times for appointments you may be dismissed from the clinic at the providers discretion.     Again, thank you for choosing [facility].  Our hope is that these requests will decrease the amount of time that you wait before being seen by our physicians.       _____________________________________________________________  Should you have questions after your visit to [facility], please contact our office at [phone] between the hours of 8:00 a.m. and 4:30 p.m.  Voicemails left after 4:00 p.m. will not be returned until the following business day.  For prescription refill requests, have your pharmacy contact our office and allow 72 hours.    Cancer Center Support Programs:   > Cancer Support Group  2nd Tuesday of the month 1pm-2pm, Journey Room

## 2018-11-28 NOTE — Progress Notes (Signed)
[facility] [place], [place] [address]   CLINIC:  Medical Oncology/Hematology  PCP:  [name], PA-C [place] [address] [phone]   REASON FOR VISIT: Follow-up for left breast cancer, triple negative  CURRENT THERAPY: Surveillance per NCCN guidelines  BRIEF ONCOLOGIC HISTORY:    Cancer of central portion of left female breast ([place])   09/16/2015 Mammogram    1.6 cm retroareolar left breast mass measuring 1.6 cm in largest diameter in addition to 3 abnormal appearing left inferior axillary lymph nodes.    09/23/2015 Initial Biopsy    US guided core biopsy of left breast mass and suspicious left axillary lymph nodes.    09/23/2015 Pathology Results    Invasive ductal carcinoma, high grade.  Left axilla lymph node is positive for metastatic carcinoma.  ER/PR NEGATIVE, HER2 NEGATIVE, KI-67 marker of 85%.    10/26/2015 Imaging    Bone scan- No evidence of metastatic disease to the skeleton.    10/27/2015 Imaging    CT CAP-  Retroareolar mass with 2 asymmetrically prominent left axillary lymph nodes and an upper normal size left subpectoral lymph node. No other findings of metastatic disease in the chest, abdomen, or pelvis.    11/05/2015 Echocardiogram    The estimated ejection fraction was in the range of 60% to 65%.    11/23/2015 Miscellaneous    Genetic Counseling- genetic test result was negative for any known pathogenic mutations within any of 28 genes that would cause her to be at an increased genetic risk for breast, ovarian, or other related cancers.    12/09/2015 Surgery    L radical mastectomy, axillary LN dissection, port a cath insertion with Dr. Aviva Signs 12/09/2015    12/11/2015 Pathology Results    - INVASIVE GRADE III DUCTAL CARCINOMA, SPANNING 2.3 CM IN GREATEST DIMENSION. - ASSOCIATED HIGH GRADE DCIS - MARGINS ARE NEGATIVE. 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 - EIGHT ADDITIONAL  LYMPH NODES WITH NO TUMOR SEEN    12/11/2015 Pathology Results    HER2 NEGATIVE, ER 0%, PR 0%, Ki-67 marker 90%    12/25/2015 - 02/12/2016 Chemotherapy    AC every 2 weeks x 4 cycles    02/04/2016 Treatment Plan Change    Treatment deferred x 7 days due to neutropenia    02/25/2016 - 05/12/2016 Chemotherapy    Paclitaxel weekly x 12    06/01/2016 - 07/19/2016 Radiation Therapy    33 fractions, Dr. Lisbeth Renshaw in [place]    02/06/2017 Surgery    (L) breast reconstruction (lat flap with tissue expander) with Dr. Iran Planas      CANCER STAGING: Cancer Staging Cancer of central portion of left female breast [facility]) Staging form: Breast, AJCC 7th Edition - Pathologic stage from 12/11/2015: Stage IIB (T2, N1a, cM0) - Signed by Baird Cancer, PA-C on 12/31/2015    INTERVAL HISTORY:  Ms. Hum 38 y.o. female returns for routine follow-up for left breast cancer.  Patient was here today for her results of her follow-up mammogram.  She has been doing well since her last visit.  She has no complaints at this time. Denies any nausea, vomiting, or diarrhea. Denies any new pains. Had not noticed any recent bleeding such as epistaxis, hematuria or hematochezia. Denies recent chest pain on exertion, shortness of breath on minimal exertion, pre-syncopal episodes, or palpitations. Denies any numbness or tingling in hands or feet. Denies any recent fevers, infections, or recent hospitalizations. Patient  reports appetite at 100% and energy level at 100%.  She is eating well and maintaining her weight at this time.   REVIEW OF SYSTEMS:  Review of Systems  All other systems reviewed and are negative.    PAST MEDICAL/SURGICAL HISTORY:  Past Medical History:  Diagnosis Date  . History of breast cancer 10/2015   left  . History of chemotherapy 2017  . History of radiation therapy   . PONV (postoperative nausea and vomiting)    nausea only   Past Surgical History:  Procedure Laterality Date  . BREAST  ENHANCEMENT SURGERY Right 05/19/2017   Procedure: Augmentation Right Breast with Saline Implant for symmetry;  Surgeon: Irene Limbo, MD;  Location: [place];  Service: Plastics;  Laterality: Right;  . LATISSIMUS FLAP TO BREAST Left 02/06/2017   Procedure: LEFT LATISSIMUS FLAP TO BREAST;  Surgeon: Irene Limbo, MD;  Location: [place];  Service: Plastics;  Laterality: Left;  Marland Kitchen MASTECTOMY MODIFIED RADICAL Left 12/09/2015   Procedure: LEFT MODIFIED RADICAL MASTECTOMY;  Surgeon: Aviva Signs, MD;  Location: [facility];  Service: General;  Laterality: Left;  Marland Kitchen MASTOPEXY Right 12/22/2017   Procedure: RIGHT MASTOPEXY;  Surgeon: Irene Limbo, MD;  Location: [place];  Service: Plastics;  Laterality: Right;  . PORT-A-CATH REMOVAL Right 05/19/2017   Procedure: REMOVAL PORT-A-CATH;  Surgeon: Irene Limbo, MD;  Location: [place];  Service: Plastics;  Laterality: Right;  . PORTACATH PLACEMENT Right 12/09/2015   Procedure: INSERTION PORT-A-CATH RIGHT SUBCLAVIAN;  Surgeon: Aviva Signs, MD;  Location: [facility];  Service: General;  Laterality: Right;  . REMOVAL OF TISSUE EXPANDER AND PLACEMENT OF IMPLANT Left 05/19/2017   Procedure: REMOVAL OF LEFT TISSUE EXPANDER AND PLACEMENT OF SALINE IMPLANT;  Surgeon: Irene Limbo, MD;  Location: [place];  Service: Plastics;  Laterality: Left;  . SKIN FULL THICKNESS GRAFT Left 12/22/2017   Procedure: LEFT NIPPLE AREOLAR COMPLEX RECONSTRUCTION WITH LOCAL FLAP  AND FULL THICKNESS SKIN GRAFT FROM LEFT GROIN;  Surgeon: Irene Limbo, MD;  Location: [place];  Service: Plastics;  Laterality: Left;  . TISSUE EXPANDER PLACEMENT Left 02/06/2017   Procedure: TISSUE EXPANDER PLACEMENT LEFT BREAST;  Surgeon: Irene Limbo, MD;  Location: [place];  Service: Plastics;  Laterality: Left;     SOCIAL HISTORY:  Social History   Socioeconomic History  . Marital status: Married    Spouse  name: Not on file  . Number of children: Not on file  . Years of education: Not on file  . Highest education level: Not on file  Occupational History  . Not on file  Social Needs  . Financial resource strain: Not on file  . Food insecurity:    Worry: Not on file    Inability: Not on file  . Transportation needs:    Medical: Not on file    Non-medical: Not on file  Tobacco Use  . Smoking status: Never Smoker  . Smokeless tobacco: Never Used  Substance and Sexual Activity  . Alcohol use: No  . Drug use: No  . Sexual activity: Yes    Birth control/protection: Condom  Lifestyle  . Physical activity:    Days per week: Not on file    Minutes per session: Not on file  . Stress: Not on file  Relationships  . Social connections:    Talks on phone: Not on file    Gets together: Not on file    Attends religious service: Not on file  Active member of club or organization: Not on file    Attends meetings of clubs or organizations: Not on file    Relationship status: Not on file  . Intimate partner violence:    Fear of current or ex partner: Not on file    Emotionally abused: Not on file    Physically abused: Not on file    Forced sexual activity: Not on file  Other Topics Concern  . Not on file  Social History Narrative  . Not on file    FAMILY HISTORY:  Family History  Problem Relation Age of Onset  . Diabetes Mother   . Heart attack Paternal Uncle   . Heart attack Paternal Grandfather 50    CURRENT MEDICATIONS:  Outpatient Encounter Medications as of 11/28/2018  Medication Sig  . ferrous sulfate 325 (65 FE) MG tablet Take 325 mg by mouth daily with breakfast.  . ibuprofen (ADVIL,MOTRIN) 800 MG tablet Take 800 mg by mouth every 8 (eight) hours as needed for cramping.   No facility-administered encounter medications on file as of 11/28/2018.     ALLERGIES:  No Known Allergies   PHYSICAL EXAM:  ECOG Performance status: 1  Vitals:   11/28/18 0943  BP: 134/86   Pulse: 81  Resp: 18  Temp: 98 F (36.7 C)  SpO2: 98%   Filed Weights   11/28/18 0943  Weight: 149 lb (67.6 kg)    Physical Exam Constitutional:      Appearance: Normal appearance. She is normal weight.  Cardiovascular:     Rate and Rhythm: Normal rate and regular rhythm.     Heart sounds: Normal heart sounds.  Pulmonary:     Effort: Pulmonary effort is normal.     Breath sounds: Normal breath sounds.  Abdominal:     General: Bowel sounds are normal.     Palpations: Abdomen is soft.  Musculoskeletal: Normal range of motion.  Skin:    General: Skin is warm and dry.  Neurological:     Mental Status: She is alert and oriented to person, place, and time. Mental status is at baseline.  Psychiatric:        Mood and Affect: Mood normal.        Behavior: Behavior normal.        Thought Content: Thought content normal.        Judgment: Judgment normal.      LABORATORY DATA:  I have reviewed the labs as listed.  CBC    Component Value Date/Time   WBC 9.1 10/26/2018 1550   RBC 4.48 10/26/2018 1550   HGB 13.2 10/26/2018 1550   HCT 39.6 10/26/2018 1550   PLT 309 10/26/2018 1550   MCV 88.4 10/26/2018 1550   MCH 29.5 10/26/2018 1550   MCHC 33.3 10/26/2018 1550   RDW 13.0 10/26/2018 1550   LYMPHSABS 2.1 10/26/2018 1550   MONOABS 0.5 10/26/2018 1550   EOSABS 0.1 10/26/2018 1550   BASOSABS 0.0 10/26/2018 1550   CMP Latest Ref Rng & Units 10/26/2018 04/27/2018 10/27/2017  Glucose 70 - 99 mg/dL 87 125(H) 115(H)  BUN 6 - 20 mg/dL _0 Creatinine 0.44 - 1.00 mg/dL 0.61 0.53 0.58  Sodium 135 - 145 mmol/L 137 140 138  Potassium 3.5 - 5.1 mmol/L 3.8 3.7 3.8  Chloride 98 - 111 mmol/L 106 107 103  CO2 22 - 32 mmol/L _1 Calcium 8.9 - 10.3 mg/dL 9.2 9.3 9.5  Total Protein 6.5 - 8.1 g/dL 7.5  7.7 7.9  Total Bilirubin 0.3 - 1.2 mg/dL 0.7 0.8 0.7  Alkaline Phos 38 - 126 U/L 87 105 119  AST 15 - 41 U/L 23 28 65(H)  ALT 0 - 44 U/L 22 37 100(H)       DIAGNOSTIC IMAGING:   I have independently reviewed the mammogram scan and discussed with the patient.  I personally performed a face-to-face visit.  All questions were answered to patient's stated satisfaction. Encouraged patient to call with any new concerns or questions before his next visit to the cancer center and we can certain see him sooner, if needed.    Interpreter machine was used with this visit.   ASSESSMENT & PLAN:   Cancer of central portion of left female breast ([place]) 1.  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  She had a mammogram performed 09/16/2015 that showed a retroareolar left breast mass measuring 1.6 cm with 3 abnormal left axillary lymph nodes noted. - She had a biopsy 09/23/2015 of the left breast mass as well as a left axillary lymph node. - Pathology reported invasive ductal carcinoma high-grade left axillary lymph node was positive for metastatic cancer. ER-/PR-/HER2- Ki-67 was 85%. - Bone scan on 10/26/2015 that was negative. -CT CAP was done 10/27/2015 showed the left breast mass and left axillary lymph nodes with no other evidence of metastatic disease. - Dr. Arnoldo Morale performed a left mastectomy and axillary lymph node dissection on 12/11/2015. - Pathology reported invasive ductal grade 3 measuring 2.3 cm.  With 2 out of 10 lymph nodes positive for malignancy. ER-/PR-/HER2- -She was treated with 4 cycles of AC followed by 12 weeks of Taxol which was completed October 2017. -She was also treated with radiation 33 fractions which was completed in December 2017. - Patient had her yearly screening mammogram on 10/25/2018 which showed BI-RADS Category 0 incomplete needs further evaluation.  Further evaluation needed for possible mass in the right breast. - Patient had her right mammogram follow-up on 11/27/2018 which showed B RADS category 2 benign.  We reviewed the results of her mammogram today and she will now need a repeat in 1  year. - Patient will follow-up in 6 months with labs.  2.  Iron deficiency anemia: - Labs done on 10/26/2018 showed her hemoglobin 13.2 and her ferritin 14. - Patient wishes to continue on her oral iron supplements at this time. -Patient does have a history of menorrhagia and she is followed by GYN. - We will repeat labs in 6 months.  3.  Abnormal liver function tests: - Patient had CT of abdomen and pelvis done on 10/05/2016 showed a fatty liver. - She then had a liver biopsy done 10/24/2016 which was also consistent with fatty liver. - Been following her LFTs.  Labs done on 10/26/2018 showed normal LFTs. - We will repeat her labs in 6 months with her follow-up appointment.  4.  Health maintenance: - Patient had right breast skin biopsy done on 12/22/2017 that was benign. -Patient had a pelvic ultrasound due to her menorrhagia on 10/24/2017 which was negative.      Orders placed this encounter:  Orders Placed This Encounter  Procedures  . Lactate dehydrogenase  . CBC with Differential/Platelet  . Comprehensive metabolic panel  . Ferritin  . Iron and TIBC  . Vitamin B12  . VITAMIN D 25 Hydroxy (Vit-D Deficiency, Fractures)  . Folate      Francene Finders, FNP-C [facility] [phone]  

## 2019-05-28 ENCOUNTER — Inpatient Hospital Stay (HOSPITAL_COMMUNITY): Payer: Self-pay | Attending: Nurse Practitioner

## 2019-05-28 ENCOUNTER — Ambulatory Visit (HOSPITAL_COMMUNITY): Payer: Self-pay | Admitting: Nurse Practitioner

## 2019-05-28 ENCOUNTER — Other Ambulatory Visit (HOSPITAL_COMMUNITY): Payer: Self-pay

## 2019-05-28 ENCOUNTER — Other Ambulatory Visit: Payer: Self-pay

## 2019-05-28 ENCOUNTER — Other Ambulatory Visit (HOSPITAL_COMMUNITY)
Admission: RE | Admit: 2019-05-28 | Discharge: 2019-05-28 | Disposition: A | Discharge status: Home / Self Care | Payer: Self-pay | Source: Ambulatory Visit | Attending: Physician Assistant | Admitting: Physician Assistant

## 2019-05-28 DIAGNOSIS — C50112 Malignant neoplasm of central portion of left female breast: Secondary | ICD-10-CM

## 2019-05-28 DIAGNOSIS — Z853 Personal history of malignant neoplasm of breast: Secondary | ICD-10-CM | POA: Insufficient documentation

## 2019-05-28 DIAGNOSIS — Z171 Estrogen receptor negative status [ER-]: Secondary | ICD-10-CM

## 2019-05-28 DIAGNOSIS — R69 Illness, unspecified: Secondary | ICD-10-CM | POA: Insufficient documentation

## 2019-05-28 DIAGNOSIS — K5900 Constipation, unspecified: Secondary | ICD-10-CM | POA: Insufficient documentation

## 2019-05-28 DIAGNOSIS — Z9221 Personal history of antineoplastic chemotherapy: Secondary | ICD-10-CM | POA: Insufficient documentation

## 2019-05-28 DIAGNOSIS — Z923 Personal history of irradiation: Secondary | ICD-10-CM | POA: Insufficient documentation

## 2019-05-28 DIAGNOSIS — E559 Vitamin D deficiency, unspecified: Secondary | ICD-10-CM | POA: Insufficient documentation

## 2019-05-28 DIAGNOSIS — E611 Iron deficiency: Secondary | ICD-10-CM | POA: Insufficient documentation

## 2019-05-28 DIAGNOSIS — Z9012 Acquired absence of left breast and nipple: Secondary | ICD-10-CM | POA: Insufficient documentation

## 2019-05-28 LAB — COMPREHENSIVE METABOLIC PANEL
ALT: 24 U/L (ref 0–44)
AST: 18 U/L (ref 15–41)
Albumin: 4.5 g/dL (ref 3.5–5.0)
Alkaline Phosphatase: 78 U/L (ref 38–126)
Anion gap: 8 (ref 5–15)
BUN: 13 mg/dL (ref 6–20)
CO2: 23 mmol/L (ref 22–32)
Calcium: 9.4 mg/dL (ref 8.9–10.3)
Chloride: 107 mmol/L (ref 98–111)
Creatinine, Ser: 0.5 mg/dL (ref 0.44–1.00)
GFR calc Af Amer: >60 mL/min (ref >60)
GFR calc non Af Amer: >60 mL/min (ref >60)
Glucose, Bld: 107 mg/dL — ABNORMAL HIGH (ref 70–99)
Potassium: 3.9 mmol/L (ref 3.5–5.1)
Sodium: 138 mmol/L (ref 135–145)
Total Bilirubin: 0.9 mg/dL (ref 0.3–1.2)
Total Protein: 7.7 g/dL (ref 6.5–8.1)

## 2019-05-28 LAB — CBC WITH DIFFERENTIAL/PLATELET
Abs Immature Granulocytes: 0.02 10*3/uL (ref 0.00–0.07)
Basophils Absolute: 0 10*3/uL (ref 0.0–0.1)
Basophils Relative: 1 %
Eosinophils Absolute: 0.1 10*3/uL (ref 0.0–0.5)
Eosinophils Relative: 2 %
HCT: 43.1 % (ref 36.0–46.0)
Hemoglobin: 13.9 g/dL (ref 12.0–15.0)
Immature Granulocytes: 0 %
Lymphocytes Relative: 33 %
Lymphs Abs: 1.8 10*3/uL (ref 0.7–4.0)
MCH: 28.8 pg (ref 26.0–34.0)
MCHC: 32.3 g/dL (ref 30.0–36.0)
MCV: 89.2 fL (ref 80.0–100.0)
Monocytes Absolute: 0.4 10*3/uL (ref 0.1–1.0)
Monocytes Relative: 7 %
Neutro Abs: 3.3 10*3/uL (ref 1.7–7.7)
Neutrophils Relative %: 57 %
Platelets: 318 10*3/uL (ref 150–400)
RBC: 4.83 MIL/uL (ref 3.87–5.11)
RDW: 13.2 % (ref 11.5–15.5)
WBC: 5.6 10*3/uL (ref 4.0–10.5)
nRBC: 0 % (ref 0.0–0.2)

## 2019-05-28 LAB — IRON AND TIBC
Iron: 126 ug/dL (ref 28–170)
Saturation Ratios: 28 % (ref 10.4–31.8)
TIBC: 455 ug/dL — ABNORMAL HIGH (ref 250–450)
UIBC: 329 ug/dL

## 2019-05-28 LAB — VITAMIN B12: Vitamin B-12: 237 pg/mL (ref 180–914)

## 2019-05-28 LAB — LACTATE DEHYDROGENASE: LDH: 118 U/L (ref 98–192)

## 2019-05-28 LAB — FERRITIN: Ferritin: 20 ng/mL (ref 11–307)

## 2019-05-28 LAB — FOLATE: Folate: 10.4 ng/mL (ref >5.9)

## 2019-05-28 LAB — VITAMIN D 25 HYDROXY (VIT D DEFICIENCY, FRACTURES): Vit D, 25-Hydroxy: 12.18 ng/mL — ABNORMAL LOW (ref 30–100)

## 2019-05-30 ENCOUNTER — Ambulatory Visit: Payer: Self-pay | Admitting: Physician Assistant

## 2019-05-30 ENCOUNTER — Encounter: Payer: Self-pay | Admitting: Physician Assistant

## 2019-05-30 DIAGNOSIS — Z789 Other specified health status: Secondary | ICD-10-CM

## 2019-05-30 DIAGNOSIS — Z853 Personal history of malignant neoplasm of breast: Secondary | ICD-10-CM

## 2019-05-30 DIAGNOSIS — Z0000 Encounter for general adult medical examination without abnormal findings: Secondary | ICD-10-CM

## 2019-05-30 NOTE — Progress Notes (Signed)
There were no vitals taken for this visit.   Subjective:    Patient ID: Michele Salas, female    DOB: 03-Aug-1981, 38 y.o.   MRN: [id number]  HPI: Michele Salas is a 38 y.o. female presenting on 05/30/2019 for Follow-up   HPI   This is a telemedicine appointemnt through [employer] due to coronavirus pandemic  I connected with  Michele Salas on 05/30/19 by a video enabled telemedicine application and verified that I am speaking with the correct person using two identifiers.   I discussed the limitations of evaluation and management by telemedicine. The patient expressed understanding and agreed to proceed.  Pt is at home.  Provider is at work.      Patient is a 38 year old female with history of breast cancer she is followed by oncology for this.  She has a follow-up appointment with oncology next week.  Today's appointment is for annual health check.  Patient was offered an office appointment but preferred to have a telemedicine appointment.  She says she is doing well.  Her only complaint is some occasional calf.  She has had this in the past.  She does not have abdominal pain.  Her BMs are regular and normal.She is not exercising regularly  She is working- at [employer]  She is wearing a mask around other people.    Her last PAP was in 2019-   She has already had influenza vaccination this season  Pt sees gyn regularly for menorrhagia       Relevant past medical, surgical, family and social history reviewed and updated as indicated. Interim medical history since our last visit reviewed. Allergies and medications reviewed and updated.    Current Outpatient Medications:  .  ferrous sulfate 325 (65 FE) MG tablet, Take 325 mg by mouth daily with breakfast., Disp: , Rfl:  .  ibuprofen (ADVIL,MOTRIN) 800 MG tablet, Take 800 mg by mouth every 8 (eight) hours as needed for cramping., Disp: , Rfl:    Review of Systems  Per HPI unless specifically  indicated above     Objective:    There were no vitals taken for this visit.  Wt Readings from Last 3 Encounters:  11/28/18 149 lb (67.6 kg)  11/02/18 147 lb (66.7 kg)  05/31/18 146 lb (66.2 kg)    Physical Exam Constitutional:      General: She is not in acute distress.    Appearance: Normal appearance. She is not ill-appearing.  HENT:     Head: Normocephalic and atraumatic.  Pulmonary:     Effort: No respiratory distress.  Neurological:     Mental Status: She is alert and oriented to person, place, and time.  Psychiatric:        Attention and Perception: Attention normal.        Speech: Speech normal.        Behavior: Behavior is cooperative.     Results for orders placed or performed in visit on 05/28/19  Lactate dehydrogenase  Result Value Ref Range   LDH 118 98 - 192 U/L  CBC with Differential/Platelet  Result Value Ref Range   WBC 5.6 4.0 - 10.5 K/uL   RBC 4.83 3.87 - 5.11 MIL/uL   Hemoglobin 13.9 12.0 - 15.0 g/dL   HCT 43.1 36.0 - 46.0 %   MCV 89.2 80.0 - 100.0 fL   MCH 28.8 26.0 - 34.0 pg   MCHC 32.3 30.0 - 36.0 g/dL   RDW 13.2 11.5 - 15.5 %  Platelets 318 150 - 400 K/uL   nRBC 0.0 0.0 - 0.2 %   Neutrophils Relative % 57 %   Neutro Abs 3.3 1.7 - 7.7 K/uL   Lymphocytes Relative 33 %   Lymphs Abs 1.8 0.7 - 4.0 K/uL   Monocytes Relative 7 %   Monocytes Absolute 0.4 0.1 - 1.0 K/uL   Eosinophils Relative 2 %   Eosinophils Absolute 0.1 0.0 - 0.5 K/uL   Basophils Relative 1 %   Basophils Absolute 0.0 0.0 - 0.1 K/uL   Immature Granulocytes 0 %   Abs Immature Granulocytes 0.02 0.00 - 0.07 K/uL  Comprehensive metabolic panel  Result Value Ref Range   Sodium 138 135 - 145 mmol/L   Potassium 3.9 3.5 - 5.1 mmol/L   Chloride 107 98 - 111 mmol/L   CO2 23 22 - 32 mmol/L   Glucose, Bld 107 (H) 70 - 99 mg/dL   BUN 13 6 - 20 mg/dL   Creatinine, Ser 0.50 0.44 - 1.00 mg/dL   Calcium 9.4 8.9 - 10.3 mg/dL   Total Protein 7.7 6.5 - 8.1 g/dL   Albumin 4.5 3.5 - 5.0  g/dL   AST 18 15 - 41 U/L   ALT 24 0 - 44 U/L   Alkaline Phosphatase 78 38 - 126 U/L   Total Bilirubin 0.9 0.3 - 1.2 mg/dL   GFR calc non Af Amer >60 >60 mL/min   GFR calc Af Amer >60 >60 mL/min   Anion gap 8 5 - 15  Ferritin  Result Value Ref Range   Ferritin 20 11 - 307 ng/mL  Iron and TIBC  Result Value Ref Range   Iron 126 28 - 170 ug/dL   TIBC 455 (H) 250 - 450 ug/dL   Saturation Ratios 28 10.4 - 31.8 %   UIBC 329 ug/dL  Vitamin B12  Result Value Ref Range   Vitamin B-12 237 180 - 914 pg/mL  VITAMIN D 25 Hydroxy (Vit-D Deficiency, Fractures)  Result Value Ref Range   Vit D, 25-Hydroxy 12.18 (L) 30 - 100 ng/mL  Folate  Result Value Ref Range   Folate 10.4 >5.9 ng/mL      Assessment & Plan:   Encounter Diagnoses  Name Primary?  . Encounter for annual health examination Yes  . Not proficient in [place] language   . History of breast cancer       Counseled on gas and discussed diet- she eats gassy diet.  Made some recommendations to reduce her incidence of gas.  Also made some recommendations on some over-the-counter products that could help her gas including Gas-X and probiotics.  Patient is encouraged to continue to wear a mask when in public per CDC guidelines to reduce risk from COVID-19.  Patient is to follow-up in 1 year.  She is to contact office sooner for any problems.

## 2019-06-05 ENCOUNTER — Other Ambulatory Visit: Payer: Self-pay

## 2019-06-05 ENCOUNTER — Encounter (HOSPITAL_COMMUNITY): Payer: Self-pay | Admitting: Hematology

## 2019-06-05 ENCOUNTER — Other Ambulatory Visit (HOSPITAL_COMMUNITY): Payer: Self-pay

## 2019-06-05 ENCOUNTER — Other Ambulatory Visit (HOSPITAL_COMMUNITY): Payer: Self-pay | Admitting: Hematology

## 2019-06-05 ENCOUNTER — Inpatient Hospital Stay (HOSPITAL_BASED_OUTPATIENT_CLINIC_OR_DEPARTMENT_OTHER): Payer: Self-pay | Admitting: Hematology

## 2019-06-05 VITALS — BP 131/83 | HR 95 | Temp 97.1°F | Resp 16 | Wt 144.0 lb

## 2019-06-05 DIAGNOSIS — Z171 Estrogen receptor negative status [ER-]: Secondary | ICD-10-CM

## 2019-06-05 DIAGNOSIS — C50112 Malignant neoplasm of central portion of left female breast: Secondary | ICD-10-CM

## 2019-06-05 DIAGNOSIS — Z1231 Encounter for screening mammogram for malignant neoplasm of breast: Secondary | ICD-10-CM

## 2019-06-05 MED ORDER — ERGOCALCIFEROL 1.25 MG (50000 UT) PO CAPS: 50000.0000 [IU] | ORAL_CAPSULE | ORAL | 1 refills | Status: DC

## 2019-06-05 NOTE — Patient Instructions (Signed)
[place] at [facility] Discharge Instructions  You were seen today by Dr. Delton Coombes. He went over your recent lab results. He will see you back in 6 months for labs and follow up.   Thank you for choosing [place] at [facility] to provide your oncology and hematology care.  To afford each patient quality time with our provider, please arrive at least 15 minutes before your scheduled appointment time.   If you have a lab appointment with the [facility] please come in thru the  Main Entrance and check in at the main information desk  You need to re-schedule your appointment should you arrive 10 or more minutes late.  We strive to give you quality time with our providers, and arriving late affects you and other patients whose appointments are after yours.  Also, if you no show three or more times for appointments you may be dismissed from the clinic at the providers discretion.     Again, thank you for choosing [facility].  Our hope is that these requests will decrease the amount of time that you wait before being seen by our physicians.       _____________________________________________________________  Should you have questions after your visit to [facility], please contact our office at [phone] between the hours of 8:00 a.m. and 4:30 p.m.  Voicemails left after 4:00 p.m. will not be returned until the following business day.  For prescription refill requests, have your pharmacy contact our office and allow 72 hours.    Cancer Center Support Programs:   > Cancer Support Group  2nd Tuesday of the month 1pm-2pm, Journey Room

## 2019-06-05 NOTE — Progress Notes (Signed)
[employer] [place], [place] [address]   CLINIC:  Medical Oncology/Hematology  PCP:  [name], PA-C [place] [address] [phone]   REASON FOR VISIT:  Follow-up for left breast cancer.  CURRENT THERAPY: Observation.  BRIEF ONCOLOGIC HISTORY:  Oncology History  Cancer of central portion of left female breast ([place])  09/16/2015 Mammogram   1.6 cm retroareolar left breast mass measuring 1.6 cm in largest diameter in addition to 3 abnormal appearing left inferior axillary lymph nodes.   09/23/2015 Initial Biopsy   US guided core biopsy of left breast mass and suspicious left axillary lymph nodes.   09/23/2015 Pathology Results   Invasive ductal carcinoma, high grade.  Left axilla lymph node is positive for metastatic carcinoma.  ER/PR NEGATIVE, HER2 NEGATIVE, KI-67 marker of 85%.   10/26/2015 Imaging   Bone scan- No evidence of metastatic disease to the skeleton.   10/27/2015 Imaging   CT CAP-  Retroareolar mass with 2 asymmetrically prominent left axillary lymph nodes and an upper normal size left subpectoral lymph node. No other findings of metastatic disease in the chest, abdomen, or pelvis.   11/05/2015 Echocardiogram   The estimated ejection fraction was in the range of 60% to 65%.   11/23/2015 Miscellaneous   Genetic Counseling- genetic test result was negative for any known pathogenic mutations within any of 28 genes that would cause her to be at an increased genetic risk for breast, ovarian, or other related cancers.   12/09/2015 Surgery   L radical mastectomy, axillary LN dissection, port a cath insertion with Dr. Aviva Signs 12/09/2015   12/11/2015 Pathology Results   - INVASIVE GRADE III DUCTAL CARCINOMA, SPANNING 2.3 CM IN GREATEST DIMENSION. - ASSOCIATED HIGH GRADE DCIS - MARGINS ARE NEGATIVE. 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 - EIGHT ADDITIONAL LYMPH NODES WITH NO TUMOR SEEN     12/11/2015 Pathology Results   HER2 NEGATIVE, ER 0%, PR 0%, Ki-67 marker 90%   12/25/2015 - 02/12/2016 Chemotherapy   AC every 2 weeks x 4 cycles   02/04/2016 Treatment Plan Change   Treatment deferred x 7 days due to neutropenia   02/25/2016 - 05/12/2016 Chemotherapy   Paclitaxel weekly x 12   06/01/2016 - 07/19/2016 Radiation Therapy   33 fractions, Dr. Lisbeth Renshaw in [place]   02/06/2017 Surgery   (L) breast reconstruction (lat flap with tissue expander) with Dr. Iran Planas      CANCER STAGING: Cancer Staging Cancer of central portion of left female breast [facility]) Staging form: Breast, AJCC 7th Edition - Pathologic stage from 12/11/2015: Stage IIB (T2, N1a, cM0) - Signed by Baird Cancer, PA-C on 12/31/2015    INTERVAL HISTORY:  Ms. Chamberlin 38 y.o. female seen for follow-up of left breast cancer.  She denies any new onset pains.  She gets constipated if she takes iron tablet daily.  She is taking MiraLAX which is not helping.  Appetite and energy levels are 100%.  Denies any headaches or vision changes.  No fevers, night sweats or weight loss in the last 6 months reported.  No hospitalizations or ER visits.  She was seen with a Spanish interpreter.    REVIEW OF SYSTEMS:  Review of Systems  Gastrointestinal: Positive for constipation.  All other systems reviewed and are negative.    PAST MEDICAL/SURGICAL HISTORY:  Past Medical History:  Diagnosis Date   History of breast cancer 10/2015   left   History of chemotherapy  2017   History of radiation therapy    PONV (postoperative nausea and vomiting)    nausea only   Past Surgical History:  Procedure Laterality Date   BREAST ENHANCEMENT SURGERY Right 05/19/2017   Procedure: Augmentation Right Breast with Saline Implant for symmetry;  Surgeon: Irene Limbo, MD;  Location: [place];  Service: Plastics;  Laterality: Right;   LATISSIMUS FLAP TO BREAST Left 02/06/2017   Procedure: LEFT LATISSIMUS FLAP TO  BREAST;  Surgeon: Irene Limbo, MD;  Location: [place];  Service: Plastics;  Laterality: Left;   MASTECTOMY MODIFIED RADICAL Left 12/09/2015   Procedure: LEFT MODIFIED RADICAL MASTECTOMY;  Surgeon: Aviva Signs, MD;  Location: [facility];  Service: General;  Laterality: Left;   MASTOPEXY Right 12/22/2017   Procedure: RIGHT MASTOPEXY;  Surgeon: Irene Limbo, MD;  Location: [place];  Service: Plastics;  Laterality: Right;   PORT-A-CATH REMOVAL Right 05/19/2017   Procedure: REMOVAL PORT-A-CATH;  Surgeon: Irene Limbo, MD;  Location: [place];  Service: Plastics;  Laterality: Right;   PORTACATH PLACEMENT Right 12/09/2015   Procedure: INSERTION PORT-A-CATH RIGHT SUBCLAVIAN;  Surgeon: Aviva Signs, MD;  Location: [facility];  Service: General;  Laterality: Right;   REMOVAL OF TISSUE EXPANDER AND PLACEMENT OF IMPLANT Left 05/19/2017   Procedure: REMOVAL OF LEFT TISSUE EXPANDER AND PLACEMENT OF SALINE IMPLANT;  Surgeon: Irene Limbo, MD;  Location: [place];  Service: Plastics;  Laterality: Left;   SKIN FULL THICKNESS GRAFT Left 12/22/2017   Procedure: LEFT NIPPLE AREOLAR COMPLEX RECONSTRUCTION WITH LOCAL FLAP  AND FULL THICKNESS SKIN GRAFT FROM LEFT GROIN;  Surgeon: Irene Limbo, MD;  Location: [place];  Service: Plastics;  Laterality: Left;   TISSUE EXPANDER PLACEMENT Left 02/06/2017   Procedure: TISSUE EXPANDER PLACEMENT LEFT BREAST;  Surgeon: Irene Limbo, MD;  Location: [place];  Service: Plastics;  Laterality: Left;     SOCIAL HISTORY:  Social History   Socioeconomic History   Marital status: Married    Spouse name: Not on file   Number of children: Not on file   Years of education: Not on file   Highest education level: Not on file  Occupational History   Not on file  Social Needs   Financial resource strain: Not on file   Food insecurity    Worry: Not on file    Inability: Not on file    Transportation needs    Medical: Not on file    Non-medical: Not on file  Tobacco Use   Smoking status: Never Smoker   Smokeless tobacco: Never Used  Substance and Sexual Activity   Alcohol use: No   Drug use: No   Sexual activity: Yes    Birth control/protection: Condom  Lifestyle   Physical activity    Days per week: Not on file    Minutes per session: Not on file   Stress: Not on file  Relationships   Social connections    Talks on phone: Not on file    Gets together: Not on file    Attends religious service: Not on file    Active member of club or organization: Not on file    Attends meetings of clubs or organizations: Not on file    Relationship status: Not on file   Intimate partner violence    Fear of current or ex partner: Not on file    Emotionally abused: Not on file    Physically abused: Not on file  Forced sexual activity: Not on file  Other Topics Concern   Not on file  Social History Narrative   Not on file    FAMILY HISTORY:  Family History  Problem Relation Age of Onset   Diabetes Mother    Heart attack Paternal Uncle    Heart attack Paternal Grandfather 55    CURRENT MEDICATIONS:  Outpatient Encounter Medications as of 06/05/2019  Medication Sig   ferrous sulfate 325 (65 FE) MG tablet Take 325 mg by mouth daily with breakfast.   ibuprofen (ADVIL,MOTRIN) 800 MG tablet Take 800 mg by mouth every 8 (eight) hours as needed for cramping.   ergocalciferol (VITAMIN D2) 1.25 MG (50000 UT) capsule Take 1 capsule (50,000 Units total) by mouth once a week.   [DISCONTINUED] acetaminophen-codeine (TYLENOL #3) 300-30 MG tablet Take 1 tablet by mouth every 6 (six) hours as needed.   [DISCONTINUED] HYDROcodone-acetaminophen (NORCO/VICODIN) 5-325 MG tablet TAKE 1 TABLET EVERY 6 HOURS BY MOUTH AS NEEDED FOR PAIN   [DISCONTINUED] ibuprofen (ADVIL) 600 MG tablet Take 600 mg by mouth every 6 (six) hours as needed.   No facility-administered  encounter medications on file as of 06/05/2019.     ALLERGIES:  No Known Allergies   PHYSICAL EXAM:  ECOG Performance status: 1  Vitals:   06/05/19 1446  BP: 131/83  Pulse: 95  Resp: 16  Temp: (!) 97.1 F (36.2 C)  SpO2: 95%   Filed Weights   06/05/19 1446  Weight: 144 lb (65.3 kg)    Physical Exam Vitals signs reviewed.  Constitutional:      Appearance: Normal appearance.  Cardiovascular:     Rate and Rhythm: Normal rate and regular rhythm.     Heart sounds: Normal heart sounds.  Pulmonary:     Effort: Pulmonary effort is normal.     Breath sounds: Normal breath sounds.  Abdominal:     General: There is no distension.     Palpations: Abdomen is soft. There is no mass.  Skin:    General: Skin is warm.  Neurological:     General: No focal deficit present.     Mental Status: She is alert and oriented to person, place, and time.  Psychiatric:        Mood and Affect: Mood normal.        Behavior: Behavior normal.    Left breast implant is within normal limits with no suspicious masses.  Right breast has no palpable masses.  No palpable lymphadenopathy.  LABORATORY DATA:  I have reviewed the labs as listed.  CBC    Component Value Date/Time   WBC 5.6 05/28/2019 0939   RBC 4.83 05/28/2019 0939   HGB 13.9 05/28/2019 0939   HCT 43.1 05/28/2019 0939   PLT 318 05/28/2019 0939   MCV 89.2 05/28/2019 0939   MCH 28.8 05/28/2019 0939   MCHC 32.3 05/28/2019 0939   RDW 13.2 05/28/2019 0939   LYMPHSABS 1.8 05/28/2019 0939   MONOABS 0.4 05/28/2019 0939   EOSABS 0.1 05/28/2019 0939   BASOSABS 0.0 05/28/2019 0939   CMP Latest Ref Rng & Units 05/28/2019 10/26/2018 04/27/2018  Glucose 70 - 99 mg/dL 107(H) 87 125(H)  BUN 6 - 20 mg/dL 13 12 8   Creatinine 0.44 - 1.00 mg/dL 0.50 0.61 0.53  Sodium 135 - 145 mmol/L 138 137 140  Potassium 3.5 - 5.1 mmol/L 3.9 3.8 3.7  Chloride 98 - 111 mmol/L 107 106 107  CO2 22 - 32 mmol/L 23 22 29   Calcium  8.9 - 10.3 mg/dL 9.4 9.2 9.3    Total Protein 6.5 - 8.1 g/dL 7.7 7.5 7.7  Total Bilirubin 0.3 - 1.2 mg/dL 0.9 0.7 0.8  Alkaline Phos 38 - 126 U/L 78 87 105  AST 15 - 41 U/L 18 23 28   ALT 0 - 44 U/L 24 22 37       DIAGNOSTIC IMAGING:  I have independently reviewed the scans and discussed with the patient.    ASSESSMENT & PLAN:   Cancer of central portion of left female breast ([place]) 1.  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 -Left mastectomy and L&D on 12/11/2015, pathology showing grade 3 IDC, 2.3 cm, 2/10 lymph nodes positive. -Adjuvant chemotherapy with 4 cycles of dose dense AC followed by 12 weeks of paclitaxel completed in October 2017. -She was treated with radiation therapy which completed in December 2017. -Right breast mammogram with ultrasound on 11/27/2018 was BI-RADS Category 2. -Physical exam today did not reveal any palpable masses.  Reconstructed left breast was also within normal limits. -She will be seen back in 6 months for follow-up.  We plan to repeat her mammogram.  2.  Iron deficiency state: -Labs on 05/28/2019 shows ferritin of 20 and TIBC of 455.  Hemoglobin was normal. -She was told to change iron to 3 times a week along with stool softener.  She is having constipation. -She will have ferritin checked again in 6 months.  3.  Vitamin D deficiency: -Vitamin D level on 05/28/2019 was 12.18. -We have sent a prescription for vitamin D 50,000 units once a week.  We will check levels at next visit.        Orders placed this encounter:  Orders Placed This Encounter  Procedures   CBC with Differential/Platelet   Comprehensive metabolic panel   Vitamin D 25 hydroxy   Vitamin B12   Iron and TIBC   Ferritin      Derek Jack, MD [place] [phone]

## 2019-06-05 NOTE — Assessment & Plan Note (Signed)
1.  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 -Left mastectomy and L&D on 12/11/2015, pathology showing grade 3 IDC, 2.3 cm, 2/10 lymph nodes positive. -Adjuvant chemotherapy with 4 cycles of dose dense AC followed by 12 weeks of paclitaxel completed in October 2017. -She was treated with radiation therapy which completed in December 2017. -Right breast mammogram with ultrasound on 11/27/2018 was BI-RADS Category 2. -Physical exam today did not reveal any palpable masses.  Reconstructed left breast was also within normal limits. -She will be seen back in 6 months for follow-up.  We plan to repeat her mammogram.  2.  Iron deficiency state: -Labs on 05/28/2019 shows ferritin of 20 and TIBC of 455.  Hemoglobin was normal. -She was told to change iron to 3 times a week along with stool softener.  She is having constipation. -She will have ferritin checked again in 6 months.  3.  Vitamin D deficiency: -Vitamin D level on 05/28/2019 was 12.18. -We have sent a prescription for vitamin D 50,000 units once a week.  We will check levels at next visit.

## 2019-06-07 ENCOUNTER — Other Ambulatory Visit (HOSPITAL_COMMUNITY): Payer: Self-pay | Admitting: Internal Medicine

## 2019-11-01 ENCOUNTER — Inpatient Hospital Stay (HOSPITAL_COMMUNITY): Payer: Self-pay | Attending: Hematology

## 2019-11-01 ENCOUNTER — Ambulatory Visit (HOSPITAL_COMMUNITY): Payer: Self-pay

## 2019-11-01 ENCOUNTER — Ambulatory Visit (HOSPITAL_COMMUNITY)
Admission: RE | Admit: 2019-11-01 | Discharge: 2019-11-01 | Disposition: A | Discharge status: Home / Self Care | Payer: Self-pay | Source: Ambulatory Visit | Attending: Hematology | Admitting: Hematology

## 2019-11-01 ENCOUNTER — Other Ambulatory Visit: Payer: Self-pay

## 2019-11-01 DIAGNOSIS — Z9221 Personal history of antineoplastic chemotherapy: Secondary | ICD-10-CM | POA: Insufficient documentation

## 2019-11-01 DIAGNOSIS — E559 Vitamin D deficiency, unspecified: Secondary | ICD-10-CM | POA: Insufficient documentation

## 2019-11-01 DIAGNOSIS — Z171 Estrogen receptor negative status [ER-]: Secondary | ICD-10-CM

## 2019-11-01 DIAGNOSIS — Z923 Personal history of irradiation: Secondary | ICD-10-CM | POA: Insufficient documentation

## 2019-11-01 DIAGNOSIS — C50112 Malignant neoplasm of central portion of left female breast: Secondary | ICD-10-CM

## 2019-11-01 DIAGNOSIS — D509 Iron deficiency anemia, unspecified: Secondary | ICD-10-CM | POA: Insufficient documentation

## 2019-11-01 DIAGNOSIS — Z9012 Acquired absence of left breast and nipple: Secondary | ICD-10-CM | POA: Insufficient documentation

## 2019-11-01 DIAGNOSIS — Z1231 Encounter for screening mammogram for malignant neoplasm of breast: Secondary | ICD-10-CM | POA: Insufficient documentation

## 2019-11-01 DIAGNOSIS — Z853 Personal history of malignant neoplasm of breast: Secondary | ICD-10-CM | POA: Insufficient documentation

## 2019-11-01 LAB — CBC WITH DIFFERENTIAL/PLATELET
Abs Immature Granulocytes: 0.01 10*3/uL (ref 0.00–0.07)
Basophils Absolute: 0 10*3/uL (ref 0.0–0.1)
Basophils Relative: 1 %
Eosinophils Absolute: 0.1 10*3/uL (ref 0.0–0.5)
Eosinophils Relative: 2 %
HCT: 39.2 % (ref 36.0–46.0)
Hemoglobin: 12.7 g/dL (ref 12.0–15.0)
Immature Granulocytes: 0 %
Lymphocytes Relative: 36 %
Lymphs Abs: 2.2 10*3/uL (ref 0.7–4.0)
MCH: 27.8 pg (ref 26.0–34.0)
MCHC: 32.4 g/dL (ref 30.0–36.0)
MCV: 85.8 fL (ref 80.0–100.0)
Monocytes Absolute: 0.5 10*3/uL (ref 0.1–1.0)
Monocytes Relative: 9 %
Neutro Abs: 3.3 10*3/uL (ref 1.7–7.7)
Neutrophils Relative %: 52 %
Platelets: 373 10*3/uL (ref 150–400)
RBC: 4.57 MIL/uL (ref 3.87–5.11)
RDW: 13.1 % (ref 11.5–15.5)
WBC: 6.1 10*3/uL (ref 4.0–10.5)
nRBC: 0 % (ref 0.0–0.2)

## 2019-11-01 LAB — COMPREHENSIVE METABOLIC PANEL
ALT: 21 U/L (ref 0–44)
AST: 19 U/L (ref 15–41)
Albumin: 4.4 g/dL (ref 3.5–5.0)
Alkaline Phosphatase: 95 U/L (ref 38–126)
Anion gap: 9 (ref 5–15)
BUN: 15 mg/dL (ref 6–20)
CO2: 26 mmol/L (ref 22–32)
Calcium: 9.4 mg/dL (ref 8.9–10.3)
Chloride: 104 mmol/L (ref 98–111)
Creatinine, Ser: 0.55 mg/dL (ref 0.44–1.00)
GFR calc Af Amer: >60 mL/min (ref >60)
GFR calc non Af Amer: >60 mL/min (ref >60)
Glucose, Bld: 98 mg/dL (ref 70–99)
Potassium: 4 mmol/L (ref 3.5–5.1)
Sodium: 139 mmol/L (ref 135–145)
Total Bilirubin: 0.8 mg/dL (ref 0.3–1.2)
Total Protein: 8 g/dL (ref 6.5–8.1)

## 2019-11-01 LAB — IRON AND TIBC
Iron: 67 ug/dL (ref 28–170)
Saturation Ratios: 12 % (ref 10.4–31.8)
TIBC: 544 ug/dL — ABNORMAL HIGH (ref 250–450)
UIBC: 477 ug/dL

## 2019-11-01 LAB — VITAMIN B12: Vitamin B-12: 314 pg/mL (ref 180–914)

## 2019-11-01 LAB — VITAMIN D 25 HYDROXY (VIT D DEFICIENCY, FRACTURES): Vit D, 25-Hydroxy: 31.82 ng/mL (ref 30–100)

## 2019-11-01 LAB — FERRITIN: Ferritin: 10 ng/mL — ABNORMAL LOW (ref 11–307)

## 2019-11-05 ENCOUNTER — Other Ambulatory Visit: Payer: Self-pay

## 2019-11-05 ENCOUNTER — Inpatient Hospital Stay (HOSPITAL_BASED_OUTPATIENT_CLINIC_OR_DEPARTMENT_OTHER): Payer: Self-pay | Admitting: Nurse Practitioner

## 2019-11-05 DIAGNOSIS — Z171 Estrogen receptor negative status [ER-]: Secondary | ICD-10-CM

## 2019-11-05 DIAGNOSIS — C50112 Malignant neoplasm of central portion of left female breast: Secondary | ICD-10-CM

## 2019-11-05 NOTE — Patient Instructions (Signed)
[facility] Discharge Instructions  Follow up in 6 months with labs    Thank you for choosing [facility] to provide your oncology and hematology care.  To afford each patient quality time with our provider, please arrive at least 15 minutes before your scheduled appointment time.   If you have a lab appointment with the Cancer Center please come in thru the Main Entrance and check in at the main information desk.  You need to re-schedule your appointment should you arrive 10 or more minutes late.  We strive to give you quality time with our providers, and arriving late affects you and other patients whose appointments are after yours.  Also, if you no show three or more times for appointments you may be dismissed from the clinic at the providers discretion.     Again, thank you for choosing [facility].  Our hope is that these requests will decrease the amount of time that you wait before being seen by our physicians.       _____________________________________________________________  Should you have questions after your visit to [facility], please contact our office at [phone] between the hours of 8:00 a.m. and 4:30 p.m.  Voicemails left after 4:00 p.m. will not be returned until the following business day.  For prescription refill requests, have your pharmacy contact our office and allow 72 hours.    Due to Covid, you will need to wear a mask upon entering the hospital. If you do not have a mask, a mask will be given to you at the Main Entrance upon arrival. For doctor visits, patients may have 1 support person with them. For treatment visits, patients can not have anyone with them due to social distancing guidelines and our immunocompromised population.      

## 2019-11-05 NOTE — Progress Notes (Signed)
[place], [place] [address]   CLINIC:  Medical Oncology/Hematology  PCP:  Michele Salas [place] [address] [phone]   REASON FOR VISIT: Follow-up for breast cancer breast cancer  CURRENT THERAPY: Surveillance per NCCN guidelines  BRIEF ONCOLOGIC HISTORY:  Oncology History  Cancer of central portion of left female breast ([place])  09/16/2015 Mammogram   1.6 cm retroareolar left breast mass measuring 1.6 cm in largest diameter in addition to 3 abnormal appearing left inferior axillary lymph nodes.   09/23/2015 Initial Biopsy   US guided core biopsy of left breast mass and suspicious left axillary lymph nodes.   09/23/2015 Pathology Results   Invasive ductal carcinoma, high grade.  Left axilla lymph node is positive for metastatic carcinoma.  ER/PR NEGATIVE, HER2 NEGATIVE, KI-67 marker of 85%.   10/26/2015 Imaging   Bone scan- No evidence of metastatic disease to the skeleton.   10/27/2015 Imaging   CT CAP-  Retroareolar mass with 2 asymmetrically prominent left axillary lymph nodes and an upper normal size left subpectoral lymph node. No other findings of metastatic disease in the chest, abdomen, or pelvis.   11/05/2015 Echocardiogram   The estimated ejection fraction was in the range of 60% to 65%.   11/23/2015 Miscellaneous   Genetic Counseling- genetic test result was negative for any known pathogenic mutations within any of 28 genes that would cause her to be at an increased genetic risk for breast, ovarian, or other related cancers.   12/09/2015 Surgery   L radical mastectomy, axillary LN dissection, port a cath insertion with Dr. Aviva Signs 12/09/2015   12/11/2015 Pathology Results   - INVASIVE GRADE III DUCTAL CARCINOMA, SPANNING 2.3 CM IN GREATEST DIMENSION. - ASSOCIATED HIGH GRADE DCIS - MARGINS ARE NEGATIVE. 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 - EIGHT ADDITIONAL LYMPH NODES  WITH NO TUMOR SEEN   12/11/2015 Pathology Results   HER2 NEGATIVE, ER 0%, PR 0%, Ki-67 marker 90%   12/25/2015 - 02/12/2016 Chemotherapy   AC every 2 weeks x 4 cycles   02/04/2016 Treatment Plan Change   Treatment deferred x 7 days due to neutropenia   02/25/2016 - 05/12/2016 Chemotherapy   Paclitaxel weekly x 12   06/01/2016 - 07/19/2016 Radiation Therapy   33 fractions, Dr. Lisbeth Renshaw in [place]   02/06/2017 Surgery   (L) breast reconstruction (lat flap with tissue expander) with Dr. Iran Planas      CANCER STAGING: Cancer Staging Cancer of central portion of left female breast [facility]) Staging form: Breast, AJCC 7th Edition - Pathologic stage from 12/11/2015: Stage IIB (T2, N1a, cM0) - Signed by [facility], Salas on 12/31/2015    INTERVAL HISTORY:  Michele Salas 39 y.o. female returns for routine follow-up for breast cancer.  Patient reports she is doing well since her last visit.  She has no complaints at this time.  She has not noticed any new lumps or bumps present. Denies any nausea, vomiting, or diarrhea. Denies any new pains. Had not noticed any recent bleeding such as epistaxis, hematuria or hematochezia. Denies recent chest pain on exertion, shortness of breath on minimal exertion, pre-syncopal episodes, or palpitations. Denies any numbness or tingling in hands or feet. Denies any recent fevers, infections, or recent hospitalizations. Patient reports appetite at 100% and energy level at 100%.  She is eating well maintain her weight at this time.    REVIEW OF SYSTEMS:  Review of Systems  Gastrointestinal: Positive for constipation.  Neurological: Positive for headaches.  All other systems reviewed and are negative.    PAST MEDICAL/SURGICAL HISTORY:  Past Medical History:  Diagnosis Date  . History of breast cancer 10/2015   left  . History of chemotherapy 2017  . History of radiation therapy   . PONV (postoperative nausea and vomiting)    nausea only   Past Surgical  History:  Procedure Laterality Date  . BREAST ENHANCEMENT SURGERY Right 05/19/2017   Procedure: Augmentation Right Breast with Saline Implant for symmetry;  Surgeon: Irene Limbo, MD;  Location: [facility];  Service: Plastics;  Laterality: Right;  . LATISSIMUS FLAP TO BREAST Left 02/06/2017   Procedure: LEFT LATISSIMUS FLAP TO BREAST;  Surgeon: Irene Limbo, MD;  Location: [facility];  Service: Plastics;  Laterality: Left;  Marland Kitchen MASTECTOMY MODIFIED RADICAL Left 12/09/2015   Procedure: LEFT MODIFIED RADICAL MASTECTOMY;  Surgeon: Aviva Signs, MD;  Location: [facility];  Service: General;  Laterality: Left;  Marland Kitchen MASTOPEXY Right 12/22/2017   Procedure: RIGHT MASTOPEXY;  Surgeon: Irene Limbo, MD;  Location: [place];  Service: Plastics;  Laterality: Right;  . PORT-A-CATH REMOVAL Right 05/19/2017   Procedure: REMOVAL PORT-A-CATH;  Surgeon: Irene Limbo, MD;  Location: [facility];  Service: Plastics;  Laterality: Right;  . PORTACATH PLACEMENT Right 12/09/2015   Procedure: INSERTION PORT-A-CATH RIGHT SUBCLAVIAN;  Surgeon: Aviva Signs, MD;  Location: [facility];  Service: General;  Laterality: Right;  . REMOVAL OF TISSUE EXPANDER AND PLACEMENT OF IMPLANT Left 05/19/2017   Procedure: REMOVAL OF LEFT TISSUE EXPANDER AND PLACEMENT OF SALINE IMPLANT;  Surgeon: Irene Limbo, MD;  Location: [facility];  Service: Plastics;  Laterality: Left;  . SKIN FULL THICKNESS GRAFT Left 12/22/2017   Procedure: LEFT NIPPLE AREOLAR COMPLEX RECONSTRUCTION WITH LOCAL FLAP  AND FULL THICKNESS SKIN GRAFT FROM LEFT GROIN;  Surgeon: Irene Limbo, MD;  Location: [facility];  Service: Plastics;  Laterality: Left;  . TISSUE EXPANDER PLACEMENT Left 02/06/2017   Procedure: TISSUE EXPANDER PLACEMENT LEFT BREAST;  Surgeon: Irene Limbo, MD;  Location: [facility];  Service: Plastics;  Laterality: Left;     SOCIAL HISTORY:  Social History   Socioeconomic  History  . Marital status: Married    Spouse name: Not on file  . Number of children: Not on file  . Years of education: Not on file  . Highest education level: Not on file  Occupational History  . Not on file  Tobacco Use  . Smoking status: Never Smoker  . Smokeless tobacco: Never Used  Substance and Sexual Activity  . Alcohol use: No  . Drug use: No  . Sexual activity: Yes    Birth control/protection: Condom  Other Topics Concern  . Not on file  Social History Narrative  . Not on file   Social Determinants of Health   Financial Resource Strain:   . Difficulty of Paying Living Expenses:   Food Insecurity:   . Worried About Charity fundraiser in the Last Year:   . Arboriculturist in the Last Year:   Transportation Needs:   . Film/video editor (Medical):   Marland Kitchen Lack of Transportation (Non-Medical):   Physical Activity:   . Days of Exercise per Week:   . Minutes of Exercise per Session:   Stress:   . Feeling of Stress :   Social Connections:   . Frequency of Communication with Friends and Family:   .  Frequency of Social Gatherings with Friends and Family:   . Attends Religious Services:   . Active Member of Clubs or Organizations:   . Attends Archivist Meetings:   Marland Kitchen Marital Status:   Intimate Partner Violence:   . Fear of Current or Ex-Partner:   . Emotionally Abused:   Marland Kitchen Physically Abused:   . Sexually Abused:     FAMILY HISTORY:  Family History  Problem Relation Age of Onset  . Diabetes Mother   . Heart attack Paternal Uncle   . Heart attack Paternal Grandfather 45    CURRENT MEDICATIONS:  Outpatient Encounter Medications as of 11/05/2019  Medication Sig  . ergocalciferol (VITAMIN D2) 1.25 MG (50000 UT) capsule Take 1 capsule (50,000 Units total) by mouth once a week.  . ferrous sulfate 325 (65 FE) MG tablet Take 325 mg by mouth daily with breakfast.  . ibuprofen (ADVIL,MOTRIN) 800 MG tablet Take 800 mg by mouth every 8 (eight) hours as  needed for cramping.   No facility-administered encounter medications on file as of 11/05/2019.    ALLERGIES:  No Known Allergies   PHYSICAL EXAM:  ECOG Performance status: 1  Vitals:   11/05/19 1501  BP: (!) 130/93  Pulse: 88  Resp: 16  Temp: (!) 97.5 F (36.4 C)  SpO2: 99%   Filed Weights   11/05/19 1501  Weight: 143 lb 12.8 oz (65.2 kg)    Physical Exam Constitutional:      Appearance: Normal appearance. She is normal weight.  Cardiovascular:     Rate and Rhythm: Normal rate and regular rhythm.     Heart sounds: Normal heart sounds.  Pulmonary:     Effort: Pulmonary effort is normal.     Breath sounds: Normal breath sounds.  Abdominal:     General: Bowel sounds are normal.     Palpations: Abdomen is soft.  Musculoskeletal:        General: Normal range of motion.  Skin:    General: Skin is warm.  Neurological:     Mental Status: She is alert and oriented to person, place, and time. Mental status is at baseline.  Psychiatric:        Mood and Affect: Mood normal.        Behavior: Behavior normal.        Thought Content: Thought content normal.        Judgment: Judgment normal.   Breast: No palpable masses, no skin changes or nipple discharge, no adenopathy. Left mastectomy with implant within normal limits.   LABORATORY DATA:  I have reviewed the labs as listed.  CBC    Component Value Date/Time   WBC 6.1 11/01/2019 1345   RBC 4.57 11/01/2019 1345   HGB 12.7 11/01/2019 1345   HCT 39.2 11/01/2019 1345   PLT 373 11/01/2019 1345   MCV 85.8 11/01/2019 1345   MCH 27.8 11/01/2019 1345   MCHC 32.4 11/01/2019 1345   RDW 13.1 11/01/2019 1345   LYMPHSABS 2.2 11/01/2019 1345   MONOABS 0.5 11/01/2019 1345   EOSABS 0.1 11/01/2019 1345   BASOSABS 0.0 11/01/2019 1345   CMP Latest Ref Rng & Units 11/01/2019 05/28/2019 10/26/2018  Glucose 70 - 99 mg/dL 98 107(H) 87  BUN 6 - 20 mg/dL 15 13 12   Creatinine 0.44 - 1.00 mg/dL 0.55 0.50 0.61  Sodium 135 - 145  mmol/L 139 138 137  Potassium 3.5 - 5.1 mmol/L 4.0 3.9 3.8  Chloride 98 - 111 mmol/L 104 107 106  CO2 22 - 32 mmol/L 26 23 22   Calcium 8.9 - 10.3 mg/dL 9.4 9.4 9.2  Total Protein 6.5 - 8.1 g/dL 8.0 7.7 7.5  Total Bilirubin 0.3 - 1.2 mg/dL 0.8 0.9 0.7  Alkaline Phos 38 - 126 U/L 95 78 87  AST 15 - 41 U/L 19 18 23   ALT 0 - 44 U/L 21 24 22     DIAGNOSTIC IMAGING:  I have independently reviewed the mammogram scans and discussed with the patient.   I personally performed a face-to-face visit.  All questions were answered to patient's stated satisfaction. Encouraged patient to call with any new concerns or questions before his next visit to the cancer center and we can certain see him sooner, if needed.     ASSESSMENT & PLAN:   Cancer of central portion of left female breast ([place]) 1.  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  She had a mammogram performed 09/16/2015 that showed a retroareolar left breast mass measuring 1.6 cm with 3 abnormal left axillary lymph nodes noted. - She had a biopsy 09/23/2015 of the left breast mass as well as a left axillary lymph node. - Pathology reported invasive ductal carcinoma high-grade left axillary lymph node was positive for metastatic cancer. ER-/PR-/HER2- Ki-67 was 85%. - Bone scan on 10/26/2015 that was negative. -CT CAP was done 10/27/2015 showed the left breast mass and left axillary lymph nodes with no other evidence of metastatic disease. - Dr. Arnoldo Morale performed a left mastectomy and axillary lymph node dissection on 12/11/2015. Pathology reported invasive ductal grade 3 measuring 2.3 cm.  With 2 out of 10 lymph nodes positive for malignancy. ER-/PR-/HER2- -She was treated with 4 cycles of AC followed by 12 weeks of Taxol which was completed October 2017. -She was also treated with radiation 33 fractions which was completed in December 2017. - Patient had her yearly screening mammogram on  10/25/2018 which showed BI-RADS Category 0 incomplete needs further evaluation.  Further evaluation needed for possible mass in the right breast. - Patient had her right mammogram follow-up on 11/27/2018 which showed B RADS category 2 benign.  We reviewed the results of her mammogram today and she will now need a repeat in 1 year. -Screening mammogram done on 11/01/2019 was BI-RADS Category 1 negative. - Patient will follow-up in 6 months with labs.  2.  Iron deficiency anemia: - Labs done on 11/01/2019 showed her hemoglobin 12.7 and her ferritin 10, percent saturation 12. - Patient wishes to continue on her oral iron supplements at this time. -Patient does have a history of menorrhagia and she is followed by GYN. - We will repeat labs in 6 months.  3.  Vitamin D deficiency: -Labs done on 11/01/2019 showed her vitamin D level 31.82. -She is still taking vitamin D 50,000 units weekly. -We will recheck her labs at her next visit  4.  Health maintenance: - Patient had right breast skin biopsy done on 12/22/2017 that was benign. -Patient had a pelvic ultrasound due to her menorrhagia on 10/24/2017 which was negative.      Orders placed this encounter:  Orders Placed This Encounter  Procedures  . Lactate dehydrogenase  . CBC with Differential/Platelet  . Comprehensive metabolic panel  . Vitamin B12  . VITAMIN D 25 Hydroxy (Vit-D Deficiency, Fractures)  . Folate  . Ferritin  . Iron and TIBC      Michele Finders, FNP-C [facility] [phone]

## 2019-11-05 NOTE — Assessment & Plan Note (Signed)
1.  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  She had a mammogram performed 09/16/2015 that showed a retroareolar left breast mass measuring 1.6 cm with 3 abnormal left axillary lymph nodes noted. - She had a biopsy 09/23/2015 of the left breast mass as well as a left axillary lymph node. - Pathology reported invasive ductal carcinoma high-grade left axillary lymph node was positive for metastatic cancer. ER-/PR-/HER2- Ki-67 was 85%. - Bone scan on 10/26/2015 that was negative. -CT CAP was done 10/27/2015 showed the left breast mass and left axillary lymph nodes with no other evidence of metastatic disease. - Dr. Arnoldo Morale performed a left mastectomy and axillary lymph node dissection on 12/11/2015. Pathology reported invasive ductal grade 3 measuring 2.3 cm.  With 2 out of 10 lymph nodes positive for malignancy. ER-/PR-/HER2- -She was treated with 4 cycles of AC followed by 12 weeks of Taxol which was completed October 2017. -She was also treated with radiation 33 fractions which was completed in December 2017. - Patient had her yearly screening mammogram on 10/25/2018 which showed BI-RADS Category 0 incomplete needs further evaluation.  Further evaluation needed for possible mass in the right breast. - Patient had her right mammogram follow-up on 11/27/2018 which showed B RADS category 2 benign.  We reviewed the results of her mammogram today and she will now need a repeat in 1 year. -Screening mammogram done on 11/01/2019 was BI-RADS Category 1 negative. - Patient will follow-up in 6 months with labs.  2.  Iron deficiency anemia: - Labs done on 11/01/2019 showed her hemoglobin 12.7 and her ferritin 10, percent saturation 12. - Patient wishes to continue on her oral iron supplements at this time. -Patient does have a history of menorrhagia and she is followed by GYN. - We will repeat labs in 6 months.  3.  Vitamin D deficiency: -Labs done on  11/01/2019 showed her vitamin D level 31.82. -She is still taking vitamin D 50,000 units weekly. -We will recheck her labs at her next visit  4.  Health maintenance: - Patient had right breast skin biopsy done on 12/22/2017 that was benign. -Patient had a pelvic ultrasound due to her menorrhagia on 10/24/2017 which was negative.

## 2020-01-01 ENCOUNTER — Other Ambulatory Visit (HOSPITAL_COMMUNITY): Payer: Self-pay | Admitting: Hematology

## 2020-05-06 ENCOUNTER — Other Ambulatory Visit: Payer: Self-pay

## 2020-05-06 ENCOUNTER — Inpatient Hospital Stay (HOSPITAL_COMMUNITY): Payer: Self-pay | Attending: Hematology

## 2020-05-06 DIAGNOSIS — Z9012 Acquired absence of left breast and nipple: Secondary | ICD-10-CM | POA: Insufficient documentation

## 2020-05-06 DIAGNOSIS — Z853 Personal history of malignant neoplasm of breast: Secondary | ICD-10-CM | POA: Insufficient documentation

## 2020-05-06 DIAGNOSIS — Z9221 Personal history of antineoplastic chemotherapy: Secondary | ICD-10-CM | POA: Insufficient documentation

## 2020-05-06 DIAGNOSIS — C50112 Malignant neoplasm of central portion of left female breast: Secondary | ICD-10-CM

## 2020-05-06 DIAGNOSIS — D509 Iron deficiency anemia, unspecified: Secondary | ICD-10-CM | POA: Insufficient documentation

## 2020-05-06 DIAGNOSIS — Z923 Personal history of irradiation: Secondary | ICD-10-CM | POA: Insufficient documentation

## 2020-05-06 DIAGNOSIS — Z171 Estrogen receptor negative status [ER-]: Secondary | ICD-10-CM

## 2020-05-06 DIAGNOSIS — E559 Vitamin D deficiency, unspecified: Secondary | ICD-10-CM | POA: Insufficient documentation

## 2020-05-06 LAB — CBC WITH DIFFERENTIAL/PLATELET
Abs Immature Granulocytes: 0.02 10*3/uL (ref 0.00–0.07)
Basophils Absolute: 0 10*3/uL (ref 0.0–0.1)
Basophils Relative: 1 %
Eosinophils Absolute: 0.1 10*3/uL (ref 0.0–0.5)
Eosinophils Relative: 2 %
HCT: 36.9 % (ref 36.0–46.0)
Hemoglobin: 11.4 g/dL — ABNORMAL LOW (ref 12.0–15.0)
Immature Granulocytes: 0 %
Lymphocytes Relative: 30 %
Lymphs Abs: 2.1 10*3/uL (ref 0.7–4.0)
MCH: 24.8 pg — ABNORMAL LOW (ref 26.0–34.0)
MCHC: 30.9 g/dL (ref 30.0–36.0)
MCV: 80.2 fL (ref 80.0–100.0)
Monocytes Absolute: 0.5 10*3/uL (ref 0.1–1.0)
Monocytes Relative: 7 %
Neutro Abs: 4.3 10*3/uL (ref 1.7–7.7)
Neutrophils Relative %: 60 %
Platelets: 360 10*3/uL (ref 150–400)
RBC: 4.6 MIL/uL (ref 3.87–5.11)
RDW: 14.1 % (ref 11.5–15.5)
WBC: 7.1 10*3/uL (ref 4.0–10.5)
nRBC: 0 % (ref 0.0–0.2)

## 2020-05-06 LAB — COMPREHENSIVE METABOLIC PANEL
ALT: 25 U/L (ref 0–44)
AST: 20 U/L (ref 15–41)
Albumin: 4.1 g/dL (ref 3.5–5.0)
Alkaline Phosphatase: 85 U/L (ref 38–126)
Anion gap: 8 (ref 5–15)
BUN: 9 mg/dL (ref 6–20)
CO2: 24 mmol/L (ref 22–32)
Calcium: 9 mg/dL (ref 8.9–10.3)
Chloride: 106 mmol/L (ref 98–111)
Creatinine, Ser: 0.5 mg/dL (ref 0.44–1.00)
GFR calc Af Amer: >60 mL/min (ref >60)
GFR calc non Af Amer: >60 mL/min (ref >60)
Glucose, Bld: 118 mg/dL — ABNORMAL HIGH (ref 70–99)
Potassium: 3.9 mmol/L (ref 3.5–5.1)
Sodium: 138 mmol/L (ref 135–145)
Total Bilirubin: 0.6 mg/dL (ref 0.3–1.2)
Total Protein: 7.4 g/dL (ref 6.5–8.1)

## 2020-05-06 LAB — IRON AND TIBC
Iron: 22 ug/dL — ABNORMAL LOW (ref 28–170)
Saturation Ratios: 4 % — ABNORMAL LOW (ref 10.4–31.8)
TIBC: 561 ug/dL — ABNORMAL HIGH (ref 250–450)
UIBC: 539 ug/dL

## 2020-05-06 LAB — VITAMIN B12: Vitamin B-12: 208 pg/mL (ref 180–914)

## 2020-05-06 LAB — FERRITIN: Ferritin: 3 ng/mL — ABNORMAL LOW (ref 11–307)

## 2020-05-06 LAB — VITAMIN D 25 HYDROXY (VIT D DEFICIENCY, FRACTURES): Vit D, 25-Hydroxy: 16.59 ng/mL — ABNORMAL LOW (ref 30–100)

## 2020-05-06 LAB — FOLATE: Folate: 10.4 ng/mL (ref >5.9)

## 2020-05-06 LAB — LACTATE DEHYDROGENASE: LDH: 111 U/L (ref 98–192)

## 2020-05-07 ENCOUNTER — Inpatient Hospital Stay (HOSPITAL_BASED_OUTPATIENT_CLINIC_OR_DEPARTMENT_OTHER): Payer: Self-pay | Admitting: Nurse Practitioner

## 2020-05-07 VITALS — BP 127/71 | HR 75 | Temp 97.1°F | Resp 18 | Wt 150.5 lb

## 2020-05-07 DIAGNOSIS — Z171 Estrogen receptor negative status [ER-]: Secondary | ICD-10-CM

## 2020-05-07 DIAGNOSIS — C50112 Malignant neoplasm of central portion of left female breast: Secondary | ICD-10-CM

## 2020-05-07 DIAGNOSIS — E559 Vitamin D deficiency, unspecified: Secondary | ICD-10-CM

## 2020-05-07 DIAGNOSIS — Z1231 Encounter for screening mammogram for malignant neoplasm of breast: Secondary | ICD-10-CM

## 2020-05-07 MED ORDER — VITAMIN D (ERGOCALCIFEROL) 1.25 MG (50000 UNIT) PO CAPS: 50000.0000 [IU] | ORAL_CAPSULE | ORAL | 5 refills | Status: DC

## 2020-05-07 NOTE — Assessment & Plan Note (Addendum)
1.  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  She had a mammogram performed 09/16/2015 that showed a retroareolar left breast mass measuring 1.6 cm with 3 abnormal left axillary lymph nodes noted. - She had a biopsy 09/23/2015 of the left breast mass as well as a left axillary lymph node. - Pathology reported invasive ductal carcinoma high-grade left axillary lymph node was positive for metastatic cancer. ER-/PR-/HER2- Ki-67 was 85%. - Bone scan on 10/26/2015 that was negative. -CT CAP was done 10/27/2015 showed the left breast mass and left axillary lymph nodes with no other evidence of metastatic disease. - Dr. Arnoldo Morale performed a left mastectomy and axillary lymph node dissection on 12/11/2015. Pathology reported invasive ductal grade 3 measuring 2.3 cm.  With 2 out of 10 lymph nodes positive for malignancy. ER-/PR-/HER2- -She was treated with 4 cycles of AC followed by 12 weeks of Taxol which was completed October 2017. -She was also treated with radiation 33 fractions which was completed in December 2017. - Patient had her yearly screening mammogram on 10/25/2018 which showed BI-RADS Category 0 incomplete needs further evaluation.  Further evaluation needed for possible mass in the right breast. - Patient had her right mammogram follow-up on 11/27/2018 which showed B RADS category 2 benign.  We reviewed the results of her mammogram today and she will now need a repeat in 1 year. -Screening mammogram done on 11/01/2019 was BI-RADS Category 1 negative. - Patient will follow-up in 6 months with labs and mammogram.  2.  Iron deficiency anemia: - Labs done on 05/06/2020 showed her hemoglobin 11.4 and her ferritin 3, percent saturation 4 -Patient has been taking her oral iron supplement however she is not absorbing it. -Patient does have a history of menorrhagia and she is followed by GYN. -We will set her up with 2 infusions of IV iron. - We will  repeat labs in 6 months.  3.  Vitamin D deficiency: -Labs done on 05/06/2020 showed her vitamin D level 16.59 -She ran out of her vitamin D prescription.  We will send her in a refill. -We will recheck her labs at her next visit  4.  Health maintenance: - Patient had right breast skin biopsy done on 12/22/2017 that was benign. -Patient had a pelvic ultrasound due to her menorrhagia on 10/24/2017 which was negative.

## 2020-05-07 NOTE — Progress Notes (Signed)
[place], [place] [address]   CLINIC:  Medical Oncology/Hematology  PCP:  [name], PA-C [place] [address] [phone]   REASON FOR VISIT: Follow-up for breast cancer and iron deficiency anemia   CURRENT THERAPY: Surveillance and intermittent iron infusions  BRIEF ONCOLOGIC HISTORY:  Oncology History  Cancer of central portion of left female breast ([place])  09/16/2015 Mammogram   1.6 cm retroareolar left breast mass measuring 1.6 cm in largest diameter in addition to 3 abnormal appearing left inferior axillary lymph nodes.   09/23/2015 Initial Biopsy   US guided core biopsy of left breast mass and suspicious left axillary lymph nodes.   09/23/2015 Pathology Results   Invasive ductal carcinoma, high grade.  Left axilla lymph node is positive for metastatic carcinoma.  ER/PR NEGATIVE, HER2 NEGATIVE, KI-67 marker of 85%.   10/26/2015 Imaging   Bone scan- No evidence of metastatic disease to the skeleton.   10/27/2015 Imaging   CT CAP-  Retroareolar mass with 2 asymmetrically prominent left axillary lymph nodes and an upper normal size left subpectoral lymph node. No other findings of metastatic disease in the chest, abdomen, or pelvis.   11/05/2015 Echocardiogram   The estimated ejection fraction was in the range of 60% to 65%.   11/23/2015 Miscellaneous   Genetic Counseling- genetic test result was negative for any known pathogenic mutations within any of 28 genes that would cause her to be at an increased genetic risk for breast, ovarian, or other related cancers.   12/09/2015 Surgery   L radical mastectomy, axillary LN dissection, port a cath insertion with Dr. Aviva Signs 12/09/2015   12/11/2015 Pathology Results   - INVASIVE GRADE III DUCTAL CARCINOMA, SPANNING 2.3 CM IN GREATEST DIMENSION. - ASSOCIATED HIGH GRADE DCIS - MARGINS ARE NEGATIVE. 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 -  EIGHT ADDITIONAL LYMPH NODES WITH NO TUMOR SEEN   12/11/2015 Pathology Results   HER2 NEGATIVE, ER 0%, PR 0%, Ki-67 marker 90%   12/25/2015 - 02/12/2016 Chemotherapy   AC every 2 weeks x 4 cycles   02/04/2016 Treatment Plan Change   Treatment deferred x 7 days due to neutropenia   02/25/2016 - 05/12/2016 Chemotherapy   Paclitaxel weekly x 12   06/01/2016 - 07/19/2016 Radiation Therapy   33 fractions, Dr. Lisbeth Renshaw in [place]   02/06/2017 Surgery   (L) breast reconstruction (lat flap with tissue expander) with Dr. Iran Planas     CANCER STAGING: Cancer Staging Cancer of central portion of left female breast [facility]) Staging form: Breast, AJCC 7th Edition - Pathologic stage from 12/11/2015: Stage IIB (T2, N1a, cM0) - Signed by Baird Cancer, PA-C on 12/31/2015    INTERVAL HISTORY:  Michele Salas 39 y.o. female returns for routine follow-up for breast cancer and iron deficiency anemia.  Patient reports she is feeling very fatigued lately.  She does still have heavy periods.  She denies any bright red bleeding per rectum or melena. Denies any nausea, vomiting, or diarrhea. Denies any new pains. Had not noticed any recent bleeding such as epistaxis, hematuria or hematochezia. Denies recent chest pain on exertion, shortness of breath on minimal exertion, pre-syncopal episodes, or palpitations. Denies any numbness or tingling in hands or feet. Denies any recent fevers, infections, or recent hospitalizations. Patient reports appetite at 75% and energy level at 25%.     REVIEW OF SYSTEMS:  Review of Systems  Constitutional: Positive for fatigue.  Cardiovascular:  Positive for leg swelling.  Gastrointestinal: Positive for constipation.  Neurological: Positive for dizziness.  All other systems reviewed and are negative.    PAST MEDICAL/SURGICAL HISTORY:  Past Medical History:  Diagnosis Date  . History of breast cancer 10/2015   left  . History of chemotherapy 2017  . History of radiation therapy    . PONV (postoperative nausea and vomiting)    nausea only   Past Surgical History:  Procedure Laterality Date  . BREAST ENHANCEMENT SURGERY Right 05/19/2017   Procedure: Augmentation Right Breast with Saline Implant for symmetry;  Surgeon: Irene Limbo, MD;  Location: [place];  Service: Plastics;  Laterality: Right;  . LATISSIMUS FLAP TO BREAST Left 02/06/2017   Procedure: LEFT LATISSIMUS FLAP TO BREAST;  Surgeon: Irene Limbo, MD;  Location: [place];  Service: Plastics;  Laterality: Left;  Marland Kitchen MASTECTOMY MODIFIED RADICAL Left 12/09/2015   Procedure: LEFT MODIFIED RADICAL MASTECTOMY;  Surgeon: Aviva Signs, MD;  Location: [facility];  Service: General;  Laterality: Left;  Marland Kitchen MASTOPEXY Right 12/22/2017   Procedure: RIGHT MASTOPEXY;  Surgeon: Irene Limbo, MD;  Location: [place];  Service: Plastics;  Laterality: Right;  . PORT-A-CATH REMOVAL Right 05/19/2017   Procedure: REMOVAL PORT-A-CATH;  Surgeon: Irene Limbo, MD;  Location: [place];  Service: Plastics;  Laterality: Right;  . PORTACATH PLACEMENT Right 12/09/2015   Procedure: INSERTION PORT-A-CATH RIGHT SUBCLAVIAN;  Surgeon: Aviva Signs, MD;  Location: [facility];  Service: General;  Laterality: Right;  . REMOVAL OF TISSUE EXPANDER AND PLACEMENT OF IMPLANT Left 05/19/2017   Procedure: REMOVAL OF LEFT TISSUE EXPANDER AND PLACEMENT OF SALINE IMPLANT;  Surgeon: Irene Limbo, MD;  Location: [place];  Service: Plastics;  Laterality: Left;  . SKIN FULL THICKNESS GRAFT Left 12/22/2017   Procedure: LEFT NIPPLE AREOLAR COMPLEX RECONSTRUCTION WITH LOCAL FLAP  AND FULL THICKNESS SKIN GRAFT FROM LEFT GROIN;  Surgeon: Irene Limbo, MD;  Location: [place];  Service: Plastics;  Laterality: Left;  . TISSUE EXPANDER PLACEMENT Left 02/06/2017   Procedure: TISSUE EXPANDER PLACEMENT LEFT BREAST;  Surgeon: Irene Limbo, MD;  Location: [place];  Service: Plastics;   Laterality: Left;     SOCIAL HISTORY:  Social History   Socioeconomic History  . Marital status: Married    Spouse name: Not on file  . Number of children: Not on file  . Years of education: Not on file  . Highest education level: Not on file  Occupational History  . Not on file  Tobacco Use  . Smoking status: Never Smoker  . Smokeless tobacco: Never Used  Vaping Use  . Vaping Use: Never used  Substance and Sexual Activity  . Alcohol use: No  . Drug use: No  . Sexual activity: Yes    Birth control/protection: Condom  Other Topics Concern  . Not on file  Social History Narrative  . Not on file   Social Determinants of Health   Financial Resource Strain:   . Difficulty of Paying Living Expenses: Not on file  Food Insecurity:   . Worried About Charity fundraiser in the Last Year: Not on file  . Ran Out of Food in the Last Year: Not on file  Transportation Needs:   . Lack of Transportation (Medical): Not on file  . Lack of Transportation (Non-Medical): Not on file  Physical Activity:   . Days of Exercise per Week: Not on file  . Minutes of Exercise per Session: Not  on file  Stress:   . Feeling of Stress : Not on file  Social Connections:   . Frequency of Communication with Friends and Family: Not on file  . Frequency of Social Gatherings with Friends and Family: Not on file  . Attends Religious Services: Not on file  . Active Member of Clubs or Organizations: Not on file  . Attends Archivist Meetings: Not on file  . Marital Status: Not on file  Intimate Partner Violence:   . Fear of Current or Ex-Partner: Not on file  . Emotionally Abused: Not on file  . Physically Abused: Not on file  . Sexually Abused: Not on file    FAMILY HISTORY:  Family History  Problem Relation Age of Onset  . Diabetes Mother   . Heart attack Paternal Uncle   . Heart attack Paternal Grandfather 28    CURRENT MEDICATIONS:  Outpatient Encounter Medications as of  05/07/2020  Medication Sig  . ferrous sulfate 325 (65 FE) MG tablet Take 325 mg by mouth daily with breakfast.  . ibuprofen (ADVIL,MOTRIN) 800 MG tablet Take 800 mg by mouth every 8 (eight) hours as needed for cramping. (Patient not taking: Reported on 05/07/2020)  . Vitamin D, Ergocalciferol, (DRISDOL) 1.25 MG (50000 UNIT) CAPS capsule TAKE ONE CAPSULE BY MOUTH ONCE A WEEK (Patient not taking: Reported on 05/07/2020)   No facility-administered encounter medications on file as of 05/07/2020.    ALLERGIES:  No Known Allergies   PHYSICAL EXAM:  ECOG Performance status: 1  Vitals:   05/07/20 0845  BP: 127/71  Pulse: 75  Resp: 18  Temp: (!) 97.1 F (36.2 C)  SpO2: 100%   Filed Weights   05/07/20 0845  Weight: 150 lb 8 oz (68.3 kg)   Physical Exam Constitutional:      Appearance: Normal appearance. She is normal weight.  Cardiovascular:     Rate and Rhythm: Normal rate and regular rhythm.     Heart sounds: Normal heart sounds.  Pulmonary:     Effort: Pulmonary effort is normal.     Breath sounds: Normal breath sounds.  Abdominal:     General: Bowel sounds are normal.     Palpations: Abdomen is soft.  Musculoskeletal:        General: Normal range of motion.  Skin:    General: Skin is warm.  Neurological:     Mental Status: She is alert and oriented to person, place, and time. Mental status is at baseline.  Psychiatric:        Mood and Affect: Mood normal.        Behavior: Behavior normal.        Thought Content: Thought content normal.        Judgment: Judgment normal.      LABORATORY DATA:  I have reviewed the labs as listed.  CBC    Component Value Date/Time   WBC 7.1 05/06/2020 1043   RBC 4.60 05/06/2020 1043   HGB 11.4 (L) 05/06/2020 1043   HCT 36.9 05/06/2020 1043   PLT 360 05/06/2020 1043   MCV 80.2 05/06/2020 1043   MCH 24.8 (L) 05/06/2020 1043   MCHC 30.9 05/06/2020 1043   RDW 14.1 05/06/2020 1043   LYMPHSABS 2.1 05/06/2020 1043   MONOABS 0.5  05/06/2020 1043   EOSABS 0.1 05/06/2020 1043   BASOSABS 0.0 05/06/2020 1043   CMP Latest Ref Rng & Units 05/06/2020 11/01/2019 05/28/2019  Glucose 70 - 99 mg/dL 118(H) 98 107(H)  BUN 6 -  20 mg/dL 9 15 13   Creatinine 0.44 - 1.00 mg/dL 0.50 0.55 0.50  Sodium 135 - 145 mmol/L 138 139 138  Potassium 3.5 - 5.1 mmol/L 3.9 4.0 3.9  Chloride 98 - 111 mmol/L 106 104 107  CO2 22 - 32 mmol/L 24 26 23   Calcium 8.9 - 10.3 mg/dL 9.0 9.4 9.4  Total Protein 6.5 - 8.1 g/dL 7.4 8.0 7.7  Total Bilirubin 0.3 - 1.2 mg/dL 0.6 0.8 0.9  Alkaline Phos 38 - 126 U/L 85 95 78  AST 15 - 41 U/L 20 19 18   ALT 0 - 44 U/L 25 21 24    All questions were answered to patient's stated satisfaction. Encouraged patient to call with any new concerns or questions before his next visit to the cancer center and we can certain see him sooner, if needed.     ASSESSMENT & PLAN:  Cancer of central portion of left female breast ([place]) 1.  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  She had a mammogram performed 09/16/2015 that showed a retroareolar left breast mass measuring 1.6 cm with 3 abnormal left axillary lymph nodes noted. - She had a biopsy 09/23/2015 of the left breast mass as well as a left axillary lymph node. - Pathology reported invasive ductal carcinoma high-grade left axillary lymph node was positive for metastatic cancer. ER-/PR-/HER2- Ki-67 was 85%. - Bone scan on 10/26/2015 that was negative. -CT CAP was done 10/27/2015 showed the left breast mass and left axillary lymph nodes with no other evidence of metastatic disease. - Dr. Arnoldo Morale performed a left mastectomy and axillary lymph node dissection on 12/11/2015. Pathology reported invasive ductal grade 3 measuring 2.3 cm.  With 2 out of 10 lymph nodes positive for malignancy. ER-/PR-/HER2- -She was treated with 4 cycles of AC followed by 12 weeks of Taxol which was completed October 2017. -She was also treated with  radiation 33 fractions which was completed in December 2017. - Patient had her yearly screening mammogram on 10/25/2018 which showed BI-RADS Category 0 incomplete needs further evaluation.  Further evaluation needed for possible mass in the right breast. - Patient had her right mammogram follow-up on 11/27/2018 which showed B RADS category 2 benign.  We reviewed the results of her mammogram today and she will now need a repeat in 1 year. -Screening mammogram done on 11/01/2019 was BI-RADS Category 1 negative. - Patient will follow-up in 6 months with labs and mammogram.  2.  Iron deficiency anemia: - Labs done on 05/06/2020 showed her hemoglobin 11.4 and her ferritin 3, percent saturation 4 -Patient has been taking her oral iron supplement however she is not absorbing it. -Patient does have a history of menorrhagia and she is followed by GYN. -We will set her up with 2 infusions of IV iron. - We will repeat labs in 6 months.  3.  Vitamin D deficiency: -Labs done on 05/06/2020 showed her vitamin D level 16.59 -She ran out of her vitamin D prescription.  We will send her in a refill. -We will recheck her labs at her next visit  4.  Health maintenance: - Patient had right breast skin biopsy done on 12/22/2017 that was benign. -Patient had a pelvic ultrasound due to her menorrhagia on 10/24/2017 which was negative.     Orders placed this encounter:  Orders Placed This Encounter  Procedures  . MM DIAG BREAST W/IMPLANT TOMO BILATERAL  . MM 3D SCREEN BREAST W/IMPLANT UNI  RIGHT  . CBC with Differential/Platelet  . Comprehensive metabolic panel  . Ferritin  . Iron and TIBC  . Lactate dehydrogenase  . Vitamin B12  . VITAMIN D 25 Hydroxy (Vit-D Deficiency, Fractures)      Francene Finders, FNP-C [place] [phone]

## 2020-05-11 ENCOUNTER — Encounter: Payer: Self-pay | Admitting: Physician Assistant

## 2020-05-11 ENCOUNTER — Other Ambulatory Visit: Payer: Self-pay

## 2020-05-11 ENCOUNTER — Ambulatory Visit: Payer: Self-pay | Admitting: Physician Assistant

## 2020-05-11 VITALS — BP 114/80 | HR 73 | Temp 98.6°F | Ht 61.75 in | Wt 148.8 lb

## 2020-05-11 DIAGNOSIS — D649 Anemia, unspecified: Secondary | ICD-10-CM

## 2020-05-11 DIAGNOSIS — C50919 Malignant neoplasm of unspecified site of unspecified female breast: Secondary | ICD-10-CM

## 2020-05-11 DIAGNOSIS — Z789 Other specified health status: Secondary | ICD-10-CM

## 2020-05-11 NOTE — Progress Notes (Signed)
BP 114/80   Pulse 73   Temp 98.6 F (37 C)   Ht 5' 1.75" (1.568 m)   Wt 148 lb 12 oz (67.5 kg)   SpO2 99%   BMI 27.43 kg/m    Subjective:    Patient ID: Michele Salas, female    DOB: 10-Oct-1980, 39 y.o.   MRN: [id number]  HPI: Michele Salas is a 39 y.o. female presenting on 05/11/2020 for Annual Exam   HPI   Pt had a negativc covid 19 screening questionnaire.    Pt is 89yoF with routine appointment.  She is Seeing oncologist for breast cancer  She Has anemia  She is not exercising regularly  She got covid vaccination but doesn't have her card with her.   She is trying to eat healthy.  She says she gets gassy.  She does Not have abdominal pain.    She gets constipated sometimes.    She is still working doing Armed forces training and education officer         Relevant past medical, surgical, family and social history reviewed and updated as indicated. Interim medical history since our last visit reviewed. Allergies and medications reviewed and updated.   Current Outpatient Medications:  .  Cyanocobalamin (VITAMIN B-12 PO), Take 1 tablet by mouth daily., Disp: , Rfl:  .  ferrous sulfate 325 (65 FE) MG tablet, Take 325 mg by mouth daily with breakfast., Disp: , Rfl:  .  ibuprofen (ADVIL,MOTRIN) 800 MG tablet, Take 800 mg by mouth every 8 (eight) hours as needed for cramping. , Disp: , Rfl:  .  Psyllium (METAMUCIL FIBER PO), Take by mouth daily., Disp: , Rfl:  .  Vitamin D, Ergocalciferol, (DRISDOL) 1.25 MG (50000 UNIT) CAPS capsule, Take 1 capsule (50,000 Units total) by mouth once a week., Disp: 16 capsule, Rfl: 5   Review of Systems  Per HPI unless specifically indicated above     Objective:    BP 114/80   Pulse 73   Temp 98.6 F (37 C)   Ht 5' 1.75" (1.568 m)   Wt 148 lb 12 oz (67.5 kg)   SpO2 99%   BMI 27.43 kg/m   Wt Readings from Last 3 Encounters:  05/11/20 148 lb 12 oz (67.5 kg)  05/07/20 150 lb 8 oz (68.3 kg)  11/05/19 143 lb 12.8 oz (65.2 kg)     Physical Exam Vitals reviewed.  Constitutional:      General: She is not in acute distress.    Appearance: She is well-developed.  HENT:     Head: Normocephalic and atraumatic.  Cardiovascular:     Rate and Rhythm: Normal rate and regular rhythm.  Pulmonary:     Effort: Pulmonary effort is normal.     Breath sounds: Normal breath sounds.  Abdominal:     General: Bowel sounds are normal.     Palpations: Abdomen is soft. There is no mass.     Tenderness: There is no abdominal tenderness.  Musculoskeletal:     Cervical back: Neck supple.     Right lower leg: No edema.     Left lower leg: No edema.  Lymphadenopathy:     Cervical: No cervical adenopathy.  Skin:    General: Skin is warm and dry.  Neurological:     Mental Status: She is alert and oriented to person, place, and time.  Psychiatric:        Behavior: Behavior normal.  Assessment & Plan:     Encounter Diagnoses  Name Primary?  . Not proficient in [place] language Yes  . Malignant neoplasm of female breast, unspecified estrogen receptor status, unspecified laterality, unspecified site of breast ([place])   . Anemia, unspecified type      -counseled pt on strategies to help gas and constipation -Pt to continue with oncologist -pt to follow up 1 year.  Will update PAP at that time.  She is to contact office sooner prn

## 2020-05-15 ENCOUNTER — Other Ambulatory Visit: Payer: Self-pay

## 2020-05-15 ENCOUNTER — Encounter (HOSPITAL_COMMUNITY): Payer: Self-pay

## 2020-05-15 ENCOUNTER — Inpatient Hospital Stay (HOSPITAL_COMMUNITY): Payer: Self-pay | Attending: Hematology

## 2020-05-15 VITALS — BP 132/62 | HR 72 | Temp 97.3°F | Resp 17

## 2020-05-15 DIAGNOSIS — D509 Iron deficiency anemia, unspecified: Secondary | ICD-10-CM | POA: Insufficient documentation

## 2020-05-15 MED ORDER — SODIUM CHLORIDE 0.9 % IV SOLN
510.0000 mg | Freq: Once | INTRAVENOUS | Status: AC
  Administered 2020-05-15: 510 mg via INTRAVENOUS
  Filled 2020-05-15: qty 510

## 2020-05-15 MED ORDER — SODIUM CHLORIDE 0.9 % IV SOLN: Freq: Once | INTRAVENOUS | Status: AC

## 2020-05-15 NOTE — Patient Instructions (Signed)
[facility]  Discharge Instructions:   _______________________________________________________________  Thank you for choosing [facility] to provide your oncology and hematology care.  To afford each patient quality time with our providers, please arrive at least 15 minutes before your scheduled appointment.  You need to re-schedule your appointment if you arrive 10 or more minutes late.  We strive to give you quality time with our providers, and arriving late affects you and other patients whose appointments are after yours.  Also, if you no show three or more times for appointments you may be dismissed from the clinic.  Again, thank you for choosing [facility] at [facility]. Our hope is that these requests will allow you access to exceptional care and in a timely manner. _______________________________________________________________  If you have questions after your visit, please contact our office at [phone] between the hours of 8:30 a.m. and 5:00 p.m. Voicemails left after 4:30 p.m. will not be returned until the following business day. _______________________________________________________________  For prescription refill requests, have your pharmacy contact our office. _______________________________________________________________  Recommendations made by the consultant and any test results will be sent to your referring physician. _______________________________________________________________ 

## 2020-05-15 NOTE — Progress Notes (Signed)
Tolerated iron infusion well today without incidence. Vital signs stable prior to discharge.  Discharged in stable condition ambulatory.

## 2020-05-22 ENCOUNTER — Other Ambulatory Visit: Payer: Self-pay

## 2020-05-22 ENCOUNTER — Inpatient Hospital Stay (HOSPITAL_COMMUNITY): Payer: Self-pay

## 2020-05-22 VITALS — BP 117/77 | HR 63 | Temp 97.5°F | Resp 18

## 2020-05-22 DIAGNOSIS — D509 Iron deficiency anemia, unspecified: Secondary | ICD-10-CM

## 2020-05-22 MED ORDER — SODIUM CHLORIDE 0.9 % IV SOLN: Freq: Once | INTRAVENOUS | Status: AC

## 2020-05-22 MED ORDER — SODIUM CHLORIDE 0.9 % IV SOLN
510.0000 mg | Freq: Once | INTRAVENOUS | Status: AC
  Administered 2020-05-22: 510 mg via INTRAVENOUS
  Filled 2020-05-22: qty 510

## 2020-05-22 NOTE — Patient Instructions (Signed)
[facility]  Discharge Instructions:   _______________________________________________________________  Thank you for choosing [facility] to provide your oncology and hematology care.  To afford each patient quality time with our providers, please arrive at least 15 minutes before your scheduled appointment.  You need to re-schedule your appointment if you arrive 10 or more minutes late.  We strive to give you quality time with our providers, and arriving late affects you and other patients whose appointments are after yours.  Also, if you no show three or more times for appointments you may be dismissed from the clinic.  Again, thank you for choosing [facility] at [facility]. Our hope is that these requests will allow you access to exceptional care and in a timely manner. _______________________________________________________________  If you have questions after your visit, please contact our office at [phone] between the hours of 8:30 a.m. and 5:00 p.m. Voicemails left after 4:30 p.m. will not be returned until the following business day. _______________________________________________________________  For prescription refill requests, have your pharmacy contact our office. _______________________________________________________________  Recommendations made by the consultant and any test results will be sent to your referring physician. _______________________________________________________________ 

## 2020-05-22 NOTE — Progress Notes (Signed)
Iron infusion given per orders. Patient tolerated it well without problems. Vitals stable and discharged home from clinic ambulatory. Follow up as scheduled.  

## 2020-10-01 ENCOUNTER — Emergency Department (HOSPITAL_COMMUNITY): Payer: No Typology Code available for payment source

## 2020-10-01 ENCOUNTER — Other Ambulatory Visit: Payer: Self-pay

## 2020-10-01 ENCOUNTER — Encounter (HOSPITAL_COMMUNITY): Payer: Self-pay | Admitting: Emergency Medicine

## 2020-10-01 ENCOUNTER — Emergency Department (HOSPITAL_COMMUNITY)
Admission: EM | Admit: 2020-10-01 | Discharge: 2020-10-01 | Disposition: A | Discharge status: Home / Self Care | Payer: No Typology Code available for payment source | Attending: Emergency Medicine | Admitting: Emergency Medicine

## 2020-10-01 DIAGNOSIS — M25572 Pain in left ankle and joints of left foot: Secondary | ICD-10-CM

## 2020-10-01 DIAGNOSIS — S62653A Nondisplaced fracture of medial phalanx of left middle finger, initial encounter for closed fracture: Secondary | ICD-10-CM

## 2020-10-01 DIAGNOSIS — M25552 Pain in left hip: Secondary | ICD-10-CM | POA: Insufficient documentation

## 2020-10-01 DIAGNOSIS — Y92410 Unspecified street and highway as the place of occurrence of the external cause: Secondary | ICD-10-CM | POA: Insufficient documentation

## 2020-10-01 DIAGNOSIS — S069X9A Unspecified intracranial injury with loss of consciousness of unspecified duration, initial encounter: Secondary | ICD-10-CM | POA: Insufficient documentation

## 2020-10-01 DIAGNOSIS — M25551 Pain in right hip: Secondary | ICD-10-CM | POA: Diagnosis not present

## 2020-10-01 DIAGNOSIS — S6992XA Unspecified injury of left wrist, hand and finger(s), initial encounter: Secondary | ICD-10-CM | POA: Diagnosis present

## 2020-10-01 DIAGNOSIS — S90511A Abrasion, right ankle, initial encounter: Secondary | ICD-10-CM | POA: Diagnosis not present

## 2020-10-01 DIAGNOSIS — S60511A Abrasion of right hand, initial encounter: Secondary | ICD-10-CM | POA: Diagnosis not present

## 2020-10-01 LAB — I-STAT BETA HCG BLOOD, ED (MC, WL, AP ONLY): I-stat hCG, quantitative: <5 m[IU]/mL (ref <5)

## 2020-10-01 MED ORDER — ONDANSETRON HCL 4 MG/2ML IJ SOLN
4.0000 mg | Freq: Once | INTRAMUSCULAR | Status: AC
  Administered 2020-10-01: 4 mg via INTRAVENOUS
  Filled 2020-10-01: qty 2

## 2020-10-01 MED ORDER — MORPHINE SULFATE (PF) 4 MG/ML IV SOLN
4.0000 mg | Freq: Once | INTRAVENOUS | Status: AC
  Administered 2020-10-01: 4 mg via INTRAVENOUS
  Filled 2020-10-01: qty 1

## 2020-10-01 MED ORDER — LACTATED RINGERS IV BOLUS
1000.0000 mL | Freq: Once | INTRAVENOUS | Status: AC
  Administered 2020-10-01: 1000 mL via INTRAVENOUS

## 2020-10-01 MED ORDER — METHOCARBAMOL 500 MG PO TABS: 500.0000 mg | ORAL_TABLET | Freq: Two times a day (BID) | ORAL | 0 refills | Status: AC

## 2020-10-01 NOTE — Progress Notes (Signed)
Orthopedic Tech Progress Note Patient Details:  Michele Salas [birth year] [id number]  Ortho Devices Type of Ortho Device: CAM walker,Finger splint,Crutches Ortho Device/Splint Location: Left Foot and Left Middle Finger Ortho Device/Splint Interventions: Application,Adjustment   Post Interventions Patient Tolerated: Well Instructions Provided: Adjustment of device   Michele Salas E Weslie Pretlow 10/01/2020, 8:44 PM

## 2020-10-01 NOTE — ED Notes (Signed)
Eldred -- husband -- updated. He is on his way- is in [place]

## 2020-10-01 NOTE — Discharge Instructions (Signed)
Please do not bear weight on your left ankle until you follow up in Orthopedic clinic.
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
## 2020-10-02 ENCOUNTER — Encounter (HOSPITAL_COMMUNITY): Payer: Self-pay

## 2020-10-13 ENCOUNTER — Telehealth: Payer: Self-pay | Admitting: Orthopaedic Surgery

## 2020-10-13 ENCOUNTER — Encounter: Payer: Self-pay | Admitting: Orthopaedic Surgery

## 2020-10-13 ENCOUNTER — Ambulatory Visit (INDEPENDENT_AMBULATORY_CARE_PROVIDER_SITE_OTHER): Payer: Self-pay

## 2020-10-13 ENCOUNTER — Ambulatory Visit (INDEPENDENT_AMBULATORY_CARE_PROVIDER_SITE_OTHER): Payer: Self-pay | Admitting: Orthopaedic Surgery

## 2020-10-13 ENCOUNTER — Other Ambulatory Visit: Payer: Self-pay

## 2020-10-13 DIAGNOSIS — M25572 Pain in left ankle and joints of left foot: Secondary | ICD-10-CM

## 2020-10-13 DIAGNOSIS — M79645 Pain in left finger(s): Secondary | ICD-10-CM

## 2020-10-13 MED ORDER — TRAMADOL HCL 50 MG PO TABS: 50.0000 mg | ORAL_TABLET | Freq: Four times a day (QID) | ORAL | 0 refills | Status: DC | PRN

## 2020-10-13 NOTE — Telephone Encounter (Signed)
Pt states she was suppose to be prescribed pain medication? Wondering if you can send it over
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
## 2020-10-13 NOTE — Telephone Encounter (Signed)
Rx sent to pharm. Patient aware.

## 2020-10-27 ENCOUNTER — Other Ambulatory Visit: Payer: Self-pay

## 2020-10-27 ENCOUNTER — Ambulatory Visit (INDEPENDENT_AMBULATORY_CARE_PROVIDER_SITE_OTHER): Payer: Self-pay | Admitting: Orthopaedic Surgery

## 2020-10-27 DIAGNOSIS — S93492D Sprain of other ligament of left ankle, subsequent encounter: Secondary | ICD-10-CM
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
## 2020-11-05 ENCOUNTER — Encounter: Payer: Self-pay | Admitting: Physical Therapy

## 2020-11-05 ENCOUNTER — Other Ambulatory Visit: Payer: Self-pay

## 2020-11-05 ENCOUNTER — Ambulatory Visit: Payer: Self-pay | Attending: Physician Assistant | Admitting: Physical Therapy

## 2020-11-05 DIAGNOSIS — M6281 Muscle weakness (generalized): Secondary | ICD-10-CM | POA: Insufficient documentation

## 2020-11-05 DIAGNOSIS — M25672 Stiffness of left ankle, not elsewhere classified: Secondary | ICD-10-CM | POA: Insufficient documentation

## 2020-11-05 DIAGNOSIS — M25572 Pain in left ankle and joints of left foot: Secondary | ICD-10-CM | POA: Insufficient documentation

## 2020-11-05 DIAGNOSIS — R600 Localized edema: Secondary | ICD-10-CM | POA: Insufficient documentation
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
## 2020-11-13 ENCOUNTER — Ambulatory Visit: Payer: Self-pay | Attending: Physician Assistant | Admitting: Physical Therapy

## 2020-11-13 ENCOUNTER — Other Ambulatory Visit: Payer: Self-pay

## 2020-11-13 ENCOUNTER — Encounter: Payer: Self-pay | Admitting: Physical Therapy

## 2020-11-13 DIAGNOSIS — R600 Localized edema: Secondary | ICD-10-CM | POA: Insufficient documentation

## 2020-11-13 DIAGNOSIS — M25672 Stiffness of left ankle, not elsewhere classified: Secondary | ICD-10-CM | POA: Insufficient documentation

## 2020-11-13 DIAGNOSIS — M6281 Muscle weakness (generalized): Secondary | ICD-10-CM | POA: Insufficient documentation

## 2020-11-13 DIAGNOSIS — M25572 Pain in left ankle and joints of left foot: Secondary | ICD-10-CM | POA: Insufficient documentation

## 2020-11-13 NOTE — Therapy (Signed)
[facility] [place], [place], [address] Phone: [phone]   Fax:  [phone]  Physical Therapy Treatment  Patient Details  Name: Michele Salas MRN: [id number] Date of Birth: 03/25/1981 Referring Provider (PT): Dwana Melena, [place]   Encounter Date: 11/13/2020   PT End of Session - 11/13/20 0939    Visit Number 2    Number of Visits 12    Date for PT Re-Evaluation 12/24/20    Authorization Type self pay    PT Start Time 0900    PT Stop Time 0948    PT Time Calculation (min) 48 min    Activity Tolerance Patient tolerated treatment well    Behavior During Therapy [facility] for tasks assessed/performed           Past Medical History:  Diagnosis Date  . History of breast cancer 10/2015   left  . History of chemotherapy 2017  . History of radiation therapy   . PONV (postoperative nausea and vomiting)    nausea only    Past Surgical History:  Procedure Laterality Date  . BREAST ENHANCEMENT SURGERY Right 05/19/2017   Procedure: Augmentation Right Breast with Saline Implant for symmetry;  Surgeon: Irene Limbo, MD;  Location: [place];  Service: Plastics;  Laterality: Right;  . LATISSIMUS FLAP TO BREAST Left 02/06/2017   Procedure: LEFT LATISSIMUS FLAP TO BREAST;  Surgeon: Irene Limbo, MD;  Location: [place];  Service: Plastics;  Laterality: Left;  Marland Kitchen MASTECTOMY MODIFIED RADICAL Left 12/09/2015   Procedure: LEFT MODIFIED RADICAL MASTECTOMY;  Surgeon: Aviva Signs, MD;  [facility]: [facility];  Service: General;  Laterality: Left;  Marland Kitchen MASTOPEXY Right 12/22/2017   Procedure: RIGHT MASTOPEXY;  Surgeon: Irene Limbo, MD;  Location: [place];  Service: Plastics;  Laterality: Right;  . PORT-A-CATH REMOVAL Right 05/19/2017   Procedure: REMOVAL PORT-A-CATH;  Surgeon: Irene Limbo, MD;  Location: [place];  Service: Plastics;  Laterality: Right;  . PORTACATH PLACEMENT Right  12/09/2015   Procedure: INSERTION PORT-A-CATH RIGHT SUBCLAVIAN;  Surgeon: Aviva Signs, MD;  [facility]: [facility];  Service: General;  Laterality: Right;  . REMOVAL OF TISSUE EXPANDER AND PLACEMENT OF IMPLANT Left 05/19/2017   Procedure: REMOVAL OF LEFT TISSUE EXPANDER AND PLACEMENT OF SALINE IMPLANT;  Surgeon: Irene Limbo, MD;  Location: [place];  Service: Plastics;  Laterality: Left;  . SKIN FULL THICKNESS GRAFT Left 12/22/2017   Procedure: LEFT NIPPLE AREOLAR COMPLEX RECONSTRUCTION WITH LOCAL FLAP  AND FULL THICKNESS SKIN GRAFT FROM LEFT GROIN;  Surgeon: Irene Limbo, MD;  Location: [place];  Service: Plastics;  Laterality: Left;  . TISSUE EXPANDER PLACEMENT Left 02/06/2017   Procedure: TISSUE EXPANDER PLACEMENT LEFT BREAST;  Surgeon: Irene Limbo, MD;  Location: [place];  Service: Plastics;  Laterality: Left;    There were no vitals filed for this visit.   Subjective Assessment - 11/13/20 0915    Subjective COVID-19 screening performed upon arrival. Patient reports no pain currently in the left ankle. Patient is consistent with doing exercises.    Patient is accompained by: Family member   Son, Antony Haste   Limitations Standing;Walking;House hold activities    How long can you stand comfortably? 30 minutes    How long can you walk comfortably? 20 minutes    Diagnostic tests x-ray: no fracutre    Patient Stated Goals walk normally    Currently in Pain? No/denies  [facility] PT Assessment - 11/13/20 0001      Assessment   Medical Diagnosis Sprain of anterior talofibular ligament of left ankle, subsequent encounter    Referring Provider (PT) Dwana Melena, PA-C    Onset Date/Surgical Date 10/01/20    Next MD Visit 11/24/2020    Prior Therapy no      Precautions   Precautions None                         [facility] Adult PT Treatment/Exercise - 11/13/20 0001      Exercises   Exercises Ankle      Modalities   Modalities  Vasopneumatic      Vasopneumatic   Number Minutes Vasopneumatic  10 minutes    Vasopnuematic Location  Ankle    Vasopneumatic Pressure Low    Vasopneumatic Temperature  34 for edema and pain      Ankle Exercises: Stretches   Soleus Stretch 2 reps;30 seconds   on 14" box     Ankle Exercises: Aerobic   Nustep Level 3 x10 mins UE and LE      Ankle Exercises: Standing   Other Standing Ankle Exercises --      Ankle Exercises: Seated   Other Seated Ankle Exercises Rockerboard x2 mins AP and x2 mins lateral    Other Seated Ankle Exercises Dynadisc x2 mins CW and CCW each; ankle inversion ball squeeze 3" hold x20                       PT Long Term Goals - 11/05/20 1759      PT LONG TERM GOAL #1   Title Patient will be independent with HEP    Time 6    Period Weeks    Status New      PT LONG TERM GOAL #2   Title Patient will demonstrate 8+ degrees of left ankle DF to improve gait mechanics.    Time 6    Period Weeks    Status New      PT LONG TERM GOAL #3   Title Patient will demonstrate 4+/5 or greater left ankle MMT in all planes to improve stability during functional tasks.    Time 6    Period Weeks    Status New      PT LONG TERM GOAL #4   Title Patient will report ability to perform ADLs and home activities with left ankle pain less than or equal to 3/10    Time 6    Period Weeks    Status New      PT LONG TERM GOAL #5   Title Patient will report ability to walk and stand for 30 minutes or greater to return to recreational activities.    Time 6    Period Weeks    Status New                 Plan - 11/13/20 5456    Clinical Impression Statement Patient arrives to physical therapy with her son as translator with minimal left ankle pain. Patient guided through exercises with demonstration and tactile cuing for form and technique. Patinet demonstrated good form and pacing after explanation. Moderate edema noted in the lateral aspect of the left  ankle. Vasopneumatic device performed with no adverse affects upon removal.    Personal Factors and Comorbidities Time since onset of injury/illness/exacerbation    Examination-Activity Limitations Locomotion Level;Transfers;Stairs;Stand;Squat    Examination-Participation  Restrictions Meal Prep;Cleaning;Laundry    Stability/Clinical Decision Making Stable/Uncomplicated    Clinical Decision Making Low    Rehab Potential Good    PT Frequency 2x / week    PT Duration 6 weeks    PT Treatment/Interventions ADLs/Self Care Home Management;Cryotherapy;Electrical Stimulation;Iontophoresis 43m/ml Dexamethasone;Moist Heat;Gait training;Stair training;Functional mobility training;Therapeutic activities;Patient/family education;Therapeutic exercise;Balance training;Neuromuscular re-education;Manual techniques;Passive range of motion;Vasopneumatic Device;Taping    PT Next Visit Plan nustep, gentle AROM and ankle strengthening, PROM to tolerance, modalities PRN for pain relief and edema control    PT Home Exercise Plan see patient education section    Consulted and Agree with Plan of Care Patient           Patient will benefit from skilled therapeutic intervention in order to improve the following deficits and impairments:  Abnormal gait,Decreased range of motion,Difficulty walking,Decreased activity tolerance,Decreased balance,Increased edema,Decreased strength,Pain  Visit Diagnosis: Pain in left ankle and joints of left foot  Stiffness of left ankle, not elsewhere classified  Localized edema  Muscle weakness (generalized)     Problem List Patient Active Problem List   Diagnosis Date Noted  . Acquired absence of breast and nipple 02/06/2017  . Transaminitis 09/28/2016  . Genetic testing 12/20/2015  . S/P mastectomy 12/09/2015  . Family history of uterine cancer 10/29/2015  . Iron deficiency anemia 10/27/2015  . [place] 10/22/2015  .  Cephalalgia 08/26/2015    KGabriela Eves PT, DPT 11/13/2020, 9:53 AM  [facility] [place] [address]Phone: [phone]  Fax:  [phone] Name: Michele GrupeMRN: [id number] of Birth: 6Nov 18, 1982

## 2020-11-19 ENCOUNTER — Encounter: Payer: Self-pay | Admitting: Physical Therapy

## 2020-11-19 ENCOUNTER — Other Ambulatory Visit: Payer: Self-pay

## 2020-11-19 ENCOUNTER — Ambulatory Visit: Payer: Self-pay | Admitting: Physical Therapy

## 2020-11-19 DIAGNOSIS — M25572 Pain in left ankle and joints of left foot: Secondary | ICD-10-CM

## 2020-11-19 DIAGNOSIS — M25672 Stiffness of left ankle, not elsewhere classified: Secondary | ICD-10-CM

## 2020-11-19 DIAGNOSIS — M6281 Muscle weakness (generalized): Secondary | ICD-10-CM

## 2020-11-19 DIAGNOSIS — R600 Localized edema: Secondary | ICD-10-CM

## 2020-11-19 NOTE — Therapy (Signed)
[facility] [place], [place], [address] Phone: [phone]   Fax:  [phone]  Physical Therapy Treatment  Patient Details  Name: Michele Salas MRN: [id number] Date of Birth: 08-10-1980 Referring Provider (PT): Dwana Melena, [place]   Encounter Date: 11/19/2020   PT End of Session - 11/19/20 1436    Visit Number 3    Number of Visits 12    Date for PT Re-Evaluation 12/24/20    Authorization Type self pay    PT Start Time 1435    PT Stop Time 1511    PT Time Calculation (min) 36 min    Activity Tolerance Patient tolerated treatment well    Behavior During Therapy [facility] for tasks assessed/performed           Past Medical History:  Diagnosis Date  . History of breast cancer 10/2015   left  . History of chemotherapy 2017  . History of radiation therapy   . PONV (postoperative nausea and vomiting)    nausea only    Past Surgical History:  Procedure Laterality Date  . BREAST ENHANCEMENT SURGERY Right 05/19/2017   Procedure: Augmentation Right Breast with Saline Implant for symmetry;  Surgeon: Irene Limbo, MD;  Location: [place];  Service: Plastics;  Laterality: Right;  . LATISSIMUS FLAP TO BREAST Left 02/06/2017   Procedure: LEFT LATISSIMUS FLAP TO BREAST;  Surgeon: Irene Limbo, MD;  Location: [place];  Service: Plastics;  Laterality: Left;  Marland Kitchen MASTECTOMY MODIFIED RADICAL Left 12/09/2015   Procedure: LEFT MODIFIED RADICAL MASTECTOMY;  Surgeon: Aviva Signs, MD;  Location: [facility];  Service: General;  Laterality: Left;  Marland Kitchen MASTOPEXY Right 12/22/2017   Procedure: RIGHT MASTOPEXY;  Surgeon: Irene Limbo, MD;  Location: [place];  Service: Plastics;  Laterality: Right;  . PORT-A-CATH REMOVAL Right 05/19/2017   Procedure: REMOVAL PORT-A-CATH;  Surgeon: Irene Limbo, MD;  Location: [place];  Service: Plastics;  Laterality: Right;  . PORTACATH PLACEMENT  Right 12/09/2015   Procedure: INSERTION PORT-A-CATH RIGHT SUBCLAVIAN;  Surgeon: Aviva Signs, MD;  Location: [facility];  Service: General;  Laterality: Right;  . REMOVAL OF TISSUE EXPANDER AND PLACEMENT OF IMPLANT Left 05/19/2017   Procedure: REMOVAL OF LEFT TISSUE EXPANDER AND PLACEMENT OF SALINE IMPLANT;  Surgeon: Irene Limbo, MD;  Location: [place];  Service: Plastics;  Laterality: Left;  . SKIN FULL THICKNESS GRAFT Left 12/22/2017   Procedure: LEFT NIPPLE AREOLAR COMPLEX RECONSTRUCTION WITH LOCAL FLAP  AND FULL THICKNESS SKIN GRAFT FROM LEFT GROIN;  Surgeon: Irene Limbo, MD;  Location: [place];  Service: Plastics;  Laterality: Left;  . TISSUE EXPANDER PLACEMENT Left 02/06/2017   Procedure: TISSUE EXPANDER PLACEMENT LEFT BREAST;  Surgeon: Irene Limbo, MD;  Location: [place];  Service: Plastics;  Laterality: Left;    There were no vitals filed for this visit.   Subjective Assessment - 11/19/20 1433    Subjective COVID-19 screening performed upon arrival. Patient reports no pain currently in the left ankle. No limitations at home with ADLs.    Patient is accompained by: Interpreter   [name]   Limitations Standing;Walking;House hold activities    How long can you stand comfortably? 30 minutes    How long can you walk comfortably? 20 minutes    Diagnostic tests x-ray: no fracutre    Patient Stated Goals walk normally    Currently in Pain? No/denies  [facility] PT Assessment - 11/19/20 0001      Assessment   Medical Diagnosis Sprain of anterior talofibular ligament of left ankle, subsequent encounter    Referring Provider (PT) Dwana Melena, PA-C    Onset Date/Surgical Date 10/01/20    Hand Dominance Right    Next MD Visit 11/24/2020      ROM / Strength   AROM / PROM / Strength AROM;Strength      AROM   Overall AROM  Within functional limits for tasks performed    AROM Assessment Site Ankle    Right/Left Ankle Left    Left  Ankle Dorsiflexion 10      Strength   Overall Strength Within functional limits for tasks performed    Strength Assessment Site Ankle    Right/Left Ankle Left    Left Ankle Dorsiflexion 4+/5    Left Ankle Inversion 4+/5    Left Ankle Eversion 4+/5                         [facility] Adult PT Treatment/Exercise - 11/19/20 0001      Modalities   Modalities Vasopneumatic      Vasopneumatic   Number Minutes Vasopneumatic  10 minutes    Vasopnuematic Location  Ankle    Vasopneumatic Pressure Medium    Vasopneumatic Temperature  34/edema      Ankle Exercises: Aerobic   Nustep Level 4 x10 mins UE and LE      Ankle Exercises: Standing   Rocker Board 3 minutes    Heel Raises Both;20 reps    Toe Raise 20 reps    Other Standing Ankle Exercises standing L lunge 14" step 3x30 sec for soleus stretch      Ankle Exercises: Seated   Other Seated Ankle Exercises L ankle DF/Inv/Ev green theraband x20 reps each                       PT Long Term Goals - 11/19/20 1443      PT LONG TERM GOAL #1   Title Patient will be independent with HEP    Time 6    Period Weeks    Status Achieved      PT LONG TERM GOAL #2   Title Patient will demonstrate 8+ degrees of left ankle DF to improve gait mechanics.    Time 6    Period Weeks    Status Achieved      PT LONG TERM GOAL #3   Title Patient will demonstrate 4+/5 or greater left ankle MMT in all planes to improve stability during functional tasks.    Time 6    Period Weeks    Status Achieved      PT LONG TERM GOAL #4   Title Patient will report ability to perform ADLs and home activities with left ankle pain less than or equal to 3/10    Time 6    Period Weeks    Status Achieved      PT LONG TERM GOAL #5   Title Patient will report ability to walk and stand for 30 minutes or greater to return to recreational activities.    Time 6    Period Weeks    Status Achieved                 Plan - 11/19/20 1518     Clinical Impression Statement Patient presented in clinic along with an interpreter with no  pain or limitations with ADLs. Patient able to tolerate strengthening and ROM with no other complaints of pain. More edema noted around L ankle but minimal in R ankle as well. Light ecchymosis noted around L lateral malleoli and more dark ecchymosis noted along medial malleoli. Met all goals that were set at evaluation. Normal vasopneumatic response noted following removal of the modalities.    Personal Factors and Comorbidities Time since onset of injury/illness/exacerbation    Examination-Activity Limitations Locomotion Level;Transfers;Stairs;Stand;Squat    Examination-Participation Restrictions Meal Prep;Cleaning;Laundry    Stability/Clinical Decision Making Stable/Uncomplicated    Rehab Potential Good    PT Frequency 2x / week    PT Duration 6 weeks    PT Treatment/Interventions ADLs/Self Care Home Management;Cryotherapy;Electrical Stimulation;Iontophoresis 59m/ml Dexamethasone;Moist Heat;Gait training;Stair training;Functional mobility training;Therapeutic activities;Patient/family education;Therapeutic exercise;Balance training;Neuromuscular re-education;Manual techniques;Passive range of motion;Vasopneumatic Device;Taping    PT Next Visit Plan Continue per MD discretion.    PT Home Exercise Plan see patient education section    Consulted and Agree with Plan of Care Patient           Patient will benefit from skilled therapeutic intervention in order to improve the following deficits and impairments:  Abnormal gait,Decreased range of motion,Difficulty walking,Decreased activity tolerance,Decreased balance,Increased edema,Decreased strength,Pain  Visit Diagnosis: Pain in left ankle and joints of left foot  Stiffness of left ankle, not elsewhere classified  Localized edema  Muscle weakness (generalized)     Problem List Patient Active Problem List   Diagnosis Date Noted  . Acquired  absence of breast and nipple 02/06/2017  . Transaminitis 09/28/2016  . Genetic testing 12/20/2015  . S/P mastectomy 12/09/2015  . Family history of uterine cancer 10/29/2015  . Iron deficiency anemia 10/27/2015  . 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 Cephalalgia 08/26/2015    KStandley Brooking PTA 11/19/20 3:26 PM   [facility] [address] [address]Phone: [phone]  Fax:  [phone] Name: PDestin KittlerMRN: [id number] of Birth: [birth year]

## 2020-11-20 ENCOUNTER — Ambulatory Visit (HOSPITAL_COMMUNITY)
Admission: RE | Admit: 2020-11-20 | Discharge: 2020-11-20 | Disposition: A | Discharge status: Home / Self Care | Payer: Self-pay | Source: Ambulatory Visit | Attending: Nurse Practitioner | Admitting: Nurse Practitioner

## 2020-11-20 DIAGNOSIS — Z1231 Encounter for screening mammogram for malignant neoplasm of breast: Secondary | ICD-10-CM | POA: Insufficient documentation

## 2020-11-24 ENCOUNTER — Ambulatory Visit (INDEPENDENT_AMBULATORY_CARE_PROVIDER_SITE_OTHER): Payer: Self-pay | Admitting: Orthopaedic Surgery

## 2020-11-24 ENCOUNTER — Other Ambulatory Visit: Payer: Self-pay

## 2020-11-24 DIAGNOSIS — S93492D Sprain of other ligament of left ankle, subsequent encounter: Secondary | ICD-10-CM

## 2020-11-24 NOTE — Progress Notes (Signed)
Office Visit Note   Patient: Michele Salas           Date of Birth: [birth year]           MRN: [id number] Visit Date: 11/24/2020              Requested by: No referring provider defined for this encounter. PCP: Pcp, No   Assessment & Plan: Visit Diagnoses:  1. Sprain of anterior talofibular ligament of left ankle, subsequent encounter     Plan: In terms of the ankle this has completely resolved and I feel that she can discontinue physical therapy at this time.  For the ring finger stiffness I reviewed Dr. Levell July recent office notes and I explained to her that it would be best to continue care at his office and to also do hand therapy there as we do not have any hand therapy at our office.  She can follow-up with [place] as needed.  The language barrier increasing complexity of the visit.  Follow-Up Instructions: Return if symptoms worsen or fail to improve.   Orders:  No orders of the defined types were placed in this encounter.  No orders of the defined types were placed in this encounter.     Procedures: No procedures performed   Clinical Data: No additional findings.   Subjective: Chief Complaint  Patient presents with  . Left Ankle - Follow-up    Ms. Wickard returns today for follow-up of left ankle sprain.  Husband and language interpreter present today.  She is currently seeing Dr. Leanora Cover for stiffness of the left ring finger.  In terms of the ankle sprain she states that she is almost 100%.  Currently doing physical therapy once a week.  Reports no pain.   Review of Systems  Constitutional: Negative.   HENT: Negative.   Eyes: Negative.   Respiratory: Negative.   Cardiovascular: Negative.   Endocrine: Negative.   Musculoskeletal: Negative.   Neurological: Negative.   Hematological: Negative.   Psychiatric/Behavioral: Negative.   All other systems reviewed and are negative.    Objective: Vital Signs: LMP 11/10/2020   Physical Exam Vitals and  nursing note reviewed.  Constitutional:      Appearance: She is well-developed.  Pulmonary:     Effort: Pulmonary effort is normal.  Skin:    General: Skin is warm.     Capillary Refill: Capillary refill takes less than 2 seconds.  Neurological:     Mental Status: She is alert and oriented to person, place, and time.  Psychiatric:        Behavior: Behavior normal.        Thought Content: Thought content normal.        Judgment: Judgment normal.     Ortho Exam Left ankle shows no tenderness or swelling.  Full range of motion without pain. Specialty Comments:  No specialty comments available.  Imaging: No results found.   PMFS History: Patient Active Problem List   Diagnosis Date Noted  . Acquired absence of breast and nipple 02/06/2017  . Transaminitis 09/28/2016  . Genetic testing 12/20/2015  . S/P mastectomy 12/09/2015  . Family history of uterine cancer 10/29/2015  . Iron deficiency anemia 10/27/2015  . Cancer of central portion of left female breast ([facility]) 10/22/2015  . Cephalalgia 08/26/2015   Past Medical History:  Diagnosis Date  . History of breast cancer 10/2015   left  . History of chemotherapy 2017  . History of radiation therapy   . PONV (  postoperative nausea and vomiting)    nausea only    Family History  Problem Relation Age of Onset  . Diabetes Mother   . Heart attack Paternal Uncle   . Heart attack Paternal Grandfather 85    Past Surgical History:  Procedure Laterality Date  . BREAST ENHANCEMENT SURGERY Right 05/19/2017   Procedure: Augmentation Right Breast with Saline Implant for symmetry;  Surgeon: Irene Limbo, MD;  Location: [place];  Service: Plastics;  Laterality: Right;  . LATISSIMUS FLAP TO BREAST Left 02/06/2017   Procedure: LEFT LATISSIMUS FLAP TO BREAST;  Surgeon: Irene Limbo, MD;  Location: [place] [facility] [place];  Service: Plastics;  Laterality: Left;  Marland Kitchen MASTECTOMY MODIFIED RADICAL Left 12/09/2015   Procedure: LEFT  MODIFIED RADICAL MASTECTOMY;  Surgeon: Aviva Signs, MD;  Location: [facility];  Service: General;  Laterality: Left;  Marland Kitchen MASTOPEXY Right 12/22/2017   Procedure: RIGHT MASTOPEXY;  Surgeon: Irene Limbo, MD;  Location: [place];  Service: Plastics;  Laterality: Right;  . PORT-A-CATH REMOVAL Right 05/19/2017   Procedure: REMOVAL PORT-A-CATH;  Surgeon: Irene Limbo, MD;  Location: [place];  Service: Plastics;  Laterality: Right;  . PORTACATH PLACEMENT Right 12/09/2015   Procedure: INSERTION PORT-A-CATH RIGHT SUBCLAVIAN;  Surgeon: Aviva Signs, MD;  Location: [facility];  Service: General;  Laterality: Right;  . REMOVAL OF TISSUE EXPANDER AND PLACEMENT OF IMPLANT Left 05/19/2017   Procedure: REMOVAL OF LEFT TISSUE EXPANDER AND PLACEMENT OF SALINE IMPLANT;  Surgeon: Irene Limbo, MD;  Location: [place];  Service: Plastics;  Laterality: Left;  . SKIN FULL THICKNESS GRAFT Left 12/22/2017   Procedure: LEFT NIPPLE AREOLAR COMPLEX RECONSTRUCTION WITH LOCAL FLAP  AND FULL THICKNESS SKIN GRAFT FROM LEFT GROIN;  Surgeon: Irene Limbo, MD;  Location: [place];  Service: Plastics;  Laterality: Left;  . TISSUE EXPANDER PLACEMENT Left 02/06/2017   Procedure: TISSUE EXPANDER PLACEMENT LEFT BREAST;  Surgeon: Irene Limbo, MD;  Location: [place];  Service: Plastics;  Laterality: Left;   Social History   Occupational History  . Not on file  Tobacco Use  . Smoking status: Never Smoker  . Smokeless tobacco: Never Used  Vaping Use  . Vaping Use: Never used  Substance and Sexual Activity  . Alcohol use: No  . Drug use: No  . Sexual activity: Yes    Birth control/protection: Condom

## 2020-11-27 ENCOUNTER — Ambulatory Visit: Payer: Self-pay | Admitting: Physical Therapy

## 2020-11-30 ENCOUNTER — Other Ambulatory Visit (HOSPITAL_COMMUNITY): Payer: Self-pay

## 2020-11-30 DIAGNOSIS — E559 Vitamin D deficiency, unspecified: Secondary | ICD-10-CM

## 2020-11-30 DIAGNOSIS — C50112 Malignant neoplasm of central portion of left female breast: Secondary | ICD-10-CM

## 2020-11-30 DIAGNOSIS — Z171 Estrogen receptor negative status [ER-]: Secondary | ICD-10-CM

## 2020-11-30 DIAGNOSIS — D509 Iron deficiency anemia, unspecified: Secondary | ICD-10-CM

## 2020-12-01 ENCOUNTER — Other Ambulatory Visit: Payer: Self-pay

## 2020-12-01 ENCOUNTER — Inpatient Hospital Stay (HOSPITAL_COMMUNITY): Payer: Self-pay | Attending: Hematology

## 2020-12-01 ENCOUNTER — Inpatient Hospital Stay (HOSPITAL_COMMUNITY): Admission: RE | Admit: 2020-12-01 | Payer: Self-pay | Source: Ambulatory Visit

## 2020-12-01 DIAGNOSIS — Z171 Estrogen receptor negative status [ER-]: Secondary | ICD-10-CM

## 2020-12-01 DIAGNOSIS — E559 Vitamin D deficiency, unspecified: Secondary | ICD-10-CM | POA: Insufficient documentation

## 2020-12-01 DIAGNOSIS — C50112 Malignant neoplasm of central portion of left female breast: Secondary | ICD-10-CM | POA: Insufficient documentation

## 2020-12-01 DIAGNOSIS — D509 Iron deficiency anemia, unspecified: Secondary | ICD-10-CM | POA: Insufficient documentation

## 2020-12-01 LAB — CBC WITH DIFFERENTIAL/PLATELET
Abs Immature Granulocytes: 0.02 10*3/uL (ref 0.00–0.07)
Basophils Absolute: 0 10*3/uL (ref 0.0–0.1)
Basophils Relative: 0 %
Eosinophils Absolute: 0.2 10*3/uL (ref 0.0–0.5)
Eosinophils Relative: 3 %
HCT: 40.1 % (ref 36.0–46.0)
Hemoglobin: 13.2 g/dL (ref 12.0–15.0)
Immature Granulocytes: 0 %
Lymphocytes Relative: 23 %
Lymphs Abs: 1.6 10*3/uL (ref 0.7–4.0)
MCH: 29.1 pg (ref 26.0–34.0)
MCHC: 32.9 g/dL (ref 30.0–36.0)
MCV: 88.5 fL (ref 80.0–100.0)
Monocytes Absolute: 0.5 10*3/uL (ref 0.1–1.0)
Monocytes Relative: 7 %
Neutro Abs: 4.7 10*3/uL (ref 1.7–7.7)
Neutrophils Relative %: 67 %
Platelets: 297 10*3/uL (ref 150–400)
RBC: 4.53 MIL/uL (ref 3.87–5.11)
RDW: 12.4 % (ref 11.5–15.5)
WBC: 7.1 10*3/uL (ref 4.0–10.5)
nRBC: 0 % (ref 0.0–0.2)

## 2020-12-01 LAB — COMPREHENSIVE METABOLIC PANEL
ALT: 52 U/L — ABNORMAL HIGH (ref 0–44)
AST: 33 U/L (ref 15–41)
Albumin: 4 g/dL (ref 3.5–5.0)
Alkaline Phosphatase: 87 U/L (ref 38–126)
Anion gap: 7 (ref 5–15)
BUN: 7 mg/dL (ref 6–20)
CO2: 23 mmol/L (ref 22–32)
Calcium: 9.3 mg/dL (ref 8.9–10.3)
Chloride: 106 mmol/L (ref 98–111)
Creatinine, Ser: 0.47 mg/dL (ref 0.44–1.00)
GFR, Estimated: >60 mL/min (ref >60)
Glucose, Bld: 129 mg/dL — ABNORMAL HIGH (ref 70–99)
Potassium: 3.8 mmol/L (ref 3.5–5.1)
Sodium: 136 mmol/L (ref 135–145)
Total Bilirubin: 0.6 mg/dL (ref 0.3–1.2)
Total Protein: 7.3 g/dL (ref 6.5–8.1)

## 2020-12-01 LAB — IRON AND TIBC
Iron: 35 ug/dL (ref 28–170)
Saturation Ratios: 7 % — ABNORMAL LOW (ref 10.4–31.8)
TIBC: 467 ug/dL — ABNORMAL HIGH (ref 250–450)
UIBC: 432 ug/dL

## 2020-12-01 LAB — LACTATE DEHYDROGENASE: LDH: 146 U/L (ref 98–192)

## 2020-12-01 LAB — VITAMIN B12: Vitamin B-12: 319 pg/mL (ref 180–914)

## 2020-12-01 LAB — VITAMIN D 25 HYDROXY (VIT D DEFICIENCY, FRACTURES): Vit D, 25-Hydroxy: 21.68 ng/mL — ABNORMAL LOW (ref 30–100)

## 2020-12-01 LAB — FERRITIN: Ferritin: 13 ng/mL (ref 11–307)

## 2020-12-08 ENCOUNTER — Inpatient Hospital Stay (HOSPITAL_COMMUNITY): Payer: Self-pay | Attending: Hematology | Admitting: Hematology

## 2020-12-08 ENCOUNTER — Other Ambulatory Visit: Payer: Self-pay

## 2020-12-08 VITALS — BP 139/92 | HR 98 | Temp 97.3°F | Resp 18 | Wt 149.0 lb

## 2020-12-08 DIAGNOSIS — C50112 Malignant neoplasm of central portion of left female breast: Secondary | ICD-10-CM

## 2020-12-08 DIAGNOSIS — E559 Vitamin D deficiency, unspecified: Secondary | ICD-10-CM | POA: Insufficient documentation

## 2020-12-08 DIAGNOSIS — Z171 Estrogen receptor negative status [ER-]: Secondary | ICD-10-CM

## 2020-12-08 DIAGNOSIS — Z853 Personal history of malignant neoplasm of breast: Secondary | ICD-10-CM | POA: Insufficient documentation

## 2020-12-08 DIAGNOSIS — D509 Iron deficiency anemia, unspecified: Secondary | ICD-10-CM | POA: Insufficient documentation

## 2020-12-08 DIAGNOSIS — Z923 Personal history of irradiation: Secondary | ICD-10-CM | POA: Insufficient documentation

## 2020-12-08 DIAGNOSIS — Z9221 Personal history of antineoplastic chemotherapy: Secondary | ICD-10-CM | POA: Insufficient documentation

## 2020-12-08 NOTE — Progress Notes (Signed)
[facility] [place], [place] [address]   CLINIC:  Medical Oncology/Hematology  PCP:  Pcp, No None  None  REASON FOR VISIT:  Follow-up for IDA and left breast cancer  PRIOR THERAPY: Oral iron tablets  CURRENT THERAPY: Intermittent Feraheme last on 05/22/2020  INTERVAL HISTORY:  [name], a 40 y.o. female, returns for routine follow-up for her IDA. Michele Salas was last seen by Francene Finders, NP, on 05/07/2020.  Today she is accompanied by a translator and reports feeling fair. She reports that her energy levels are decreased and is alternating day by day. She reports that her energy levels improved after her last iron infusion. She is taking vitamin D once a week, but she is not taking iron tablets or B12. She reports that she gets constipated with iron tablets.   REVIEW OF SYSTEMS:  Review of Systems  Constitutional: Positive for fatigue (25%). Negative for appetite change.  All other systems reviewed and are negative.   PAST MEDICAL/SURGICAL HISTORY:  Past Medical History:  Diagnosis Date  . History of breast cancer 10/2015   left  . History of chemotherapy 2017  . History of radiation therapy   . PONV (postoperative nausea and vomiting)    nausea only   Past Surgical History:  Procedure Laterality Date  . BREAST ENHANCEMENT SURGERY Right 05/19/2017   Procedure: Augmentation Right Breast with Saline Implant for symmetry;  Surgeon: Irene Limbo, MD;  Location: [facility];  Service: Plastics;  Laterality: Right;  . LATISSIMUS FLAP TO BREAST Left 02/06/2017   Procedure: LEFT LATISSIMUS FLAP TO BREAST;  Surgeon: Irene Limbo, MD;  Location: [facility];  Service: Plastics;  Laterality: Left;  Marland Kitchen MASTECTOMY MODIFIED RADICAL Left 12/09/2015   Procedure: LEFT MODIFIED RADICAL MASTECTOMY;  Surgeon: Aviva Signs, MD;  Location: [facility];  Service: General;  Laterality: Left;  Marland Kitchen MASTOPEXY Right 12/22/2017   Procedure: RIGHT  MASTOPEXY;  Surgeon: Irene Limbo, MD;  Location: [facility];  Service: Plastics;  Laterality: Right;  . PORT-A-CATH REMOVAL Right 05/19/2017   Procedure: REMOVAL PORT-A-CATH;  Surgeon: Irene Limbo, MD;  Location: [facility];  Service: Plastics;  Laterality: Right;  . PORTACATH PLACEMENT Right 12/09/2015   Procedure: INSERTION PORT-A-CATH RIGHT SUBCLAVIAN;  Surgeon: Aviva Signs, MD;  Location: [facility];  Service: General;  Laterality: Right;  . REMOVAL OF TISSUE EXPANDER AND PLACEMENT OF IMPLANT Left 05/19/2017   Procedure: REMOVAL OF LEFT TISSUE EXPANDER AND PLACEMENT OF SALINE IMPLANT;  Surgeon: Irene Limbo, MD;  Location: [facility];  Service: Plastics;  Laterality: Left;  . SKIN FULL THICKNESS GRAFT Left 12/22/2017   Procedure: LEFT NIPPLE AREOLAR COMPLEX RECONSTRUCTION WITH LOCAL FLAP  AND FULL THICKNESS SKIN GRAFT FROM LEFT GROIN;  Surgeon: Irene Limbo, MD;  Location: [facility];  Service: Plastics;  Laterality: Left;  . TISSUE EXPANDER PLACEMENT Left 02/06/2017   Procedure: TISSUE EXPANDER PLACEMENT LEFT BREAST;  Surgeon: Irene Limbo, MD;  Location: [facility];  Service: Plastics;  Laterality: Left;    SOCIAL HISTORY:  Social History   Socioeconomic History  . Marital status: Married    Spouse name: Not on file  . Number of children: Not on file  . Years of education: Not on file  . Highest education level: Not on file  Occupational History  . Not on file  Tobacco Use  . Smoking status: Never Smoker  . Smokeless tobacco: Never Used  Vaping Use  .  Vaping Use: Never used  Substance and Sexual Activity  . Alcohol use: No  . Drug use: No  . Sexual activity: Yes    Birth control/protection: Condom  Other Topics Concern  . Not on file  Social History Narrative   ** Merged History Encounter **       Social Determinants of Health   Financial Resource Strain: Not on file  Food Insecurity: Not on  file  Transportation Needs: Not on file  Physical Activity: Not on file  Stress: Not on file  Social Connections: Not on file  Intimate Partner Violence: Not on file    FAMILY HISTORY:  Family History  Problem Relation Age of Onset  . Diabetes Mother   . Heart attack Paternal Uncle   . Heart attack Paternal Grandfather 13    CURRENT MEDICATIONS:  Current Outpatient Medications  Medication Sig Dispense Refill  . Cyanocobalamin (VITAMIN B-12 PO) Take 1 tablet by mouth daily.    . ferrous sulfate 325 (65 FE) MG tablet Take 325 mg by mouth daily with breakfast.    . ibuprofen (ADVIL,MOTRIN) 800 MG tablet Take 800 mg by mouth every 8 (eight) hours as needed for cramping.     . Psyllium (METAMUCIL FIBER PO) Take by mouth daily.    . traMADol (ULTRAM) 50 MG tablet Take 1 tablet (50 mg total) by mouth every 6 (six) hours as needed. 60 tablet 0  . Vitamin D, Ergocalciferol, (DRISDOL) 1.25 MG (50000 UNIT) CAPS capsule Take 1 capsule (50,000 Units total) by mouth once a week. 16 capsule 5   No current facility-administered medications for this visit.    ALLERGIES:  No Known Allergies  PHYSICAL EXAM:  Performance status (ECOG): 1 - Symptomatic but completely ambulatory  Vitals:   12/08/20 1210  BP: (!) 139/92  Pulse: 98  Resp: 18  Temp: (!) 97.3 F (36.3 C)  SpO2: 95%   Wt Readings from Last 3 Encounters:  12/08/20 149 lb (67.6 kg)  05/11/20 148 lb 12 oz (67.5 kg)  05/07/20 150 lb 8 oz (68.3 kg)   Physical Exam Vitals reviewed.  Constitutional:      Appearance: Normal appearance.  Cardiovascular:     Rate and Rhythm: Normal rate and regular rhythm.     Pulses: Normal pulses.     Heart sounds: Normal heart sounds.  Pulmonary:     Effort: Pulmonary effort is normal.     Breath sounds: Normal breath sounds.  Chest:  Breasts:     Right: Normal. No swelling, bleeding, inverted nipple, mass, nipple discharge, skin change, tenderness or axillary adenopathy.     Left: No  swelling, bleeding, inverted nipple, mass, nipple discharge, skin change, tenderness or axillary adenopathy.      Comments: Left implant WNL Abdominal:     Palpations: Abdomen is soft. There is no hepatomegaly or mass.     Tenderness: There is no abdominal tenderness.  Musculoskeletal:     Right lower leg: No edema.     Left lower leg: No edema.  Lymphadenopathy:     Upper Body:     Right upper body: No axillary or pectoral adenopathy.     Left upper body: No axillary or pectoral adenopathy.  Neurological:     General: No focal deficit present.     Mental Status: She is alert and oriented to person, place, and time.  Psychiatric:        Mood and Affect: Mood normal.  Behavior: Behavior normal.     LABORATORY DATA:  I have reviewed the labs as listed.  CBC Latest Ref Rng & Units 12/01/2020 05/06/2020 11/01/2019  WBC 4.0 - 10.5 K/uL 7.1 7.1 6.1  Hemoglobin 12.0 - 15.0 g/dL 13.2 11.4(L) 12.7  Hematocrit 36.0 - 46.0 % 40.1 36.9 39.2  Platelets 150 - 400 K/uL 297 360 373   CMP Latest Ref Rng & Units 12/01/2020 05/06/2020 11/01/2019  Glucose 70 - 99 mg/dL 129(H) 118(H) 98  BUN 6 - 20 mg/dL 7 9 15   Creatinine 0.44 - 1.00 mg/dL 0.47 0.50 0.55  Sodium 135 - 145 mmol/L 136 138 139  Potassium 3.5 - 5.1 mmol/L 3.8 3.9 4.0  Chloride 98 - 111 mmol/L 106 106 104  CO2 22 - 32 mmol/L 23 24 26   Calcium 8.9 - 10.3 mg/dL 9.3 9.0 9.4  Total Protein 6.5 - 8.1 g/dL 7.3 7.4 8.0  Total Bilirubin 0.3 - 1.2 mg/dL 0.6 0.6 0.8  Alkaline Phos 38 - 126 U/L 87 85 95  AST 15 - 41 U/L 33 20 19  ALT 0 - 44 U/L 52(H) 25 21      Component Value Date/Time   RBC 4.53 12/01/2020 1138   MCV 88.5 12/01/2020 1138   MCH 29.1 12/01/2020 1138   MCHC 32.9 12/01/2020 1138   RDW 12.4 12/01/2020 1138   LYMPHSABS 1.6 12/01/2020 1138   MONOABS 0.5 12/01/2020 1138   EOSABS 0.2 12/01/2020 1138   BASOSABS 0.0 12/01/2020 1138   Lab Results  Component Value Date   LDH 146 12/01/2020   LDH 111 05/06/2020   LDH  118 05/28/2019   Lab Results  Component Value Date   TIBC 467 (H) 12/01/2020   TIBC 561 (H) 05/06/2020   TIBC 544 (H) 11/01/2019   FERRITIN 13 12/01/2020   FERRITIN 3 (L) 05/06/2020   FERRITIN 10 (L) 11/01/2019   IRONPCTSAT 7 (L) 12/01/2020   IRONPCTSAT 4 (L) 05/06/2020   IRONPCTSAT 12 11/01/2019   Lab Results  Component Value Date   VD25OH 21.68 (L) 12/01/2020   VD25OH 16.59 (L) 05/06/2020   VD25OH 31.82 11/01/2019    DIAGNOSTIC IMAGING:  I have independently reviewed the scans and discussed with the patient. MM 3D SCREEN BREAST W/IMPLANT UNI RIGHT  Result Date: 11/23/2020 CLINICAL DATA:  Screening. EXAM: DIGITAL SCREENING UNILATERAL RIGHT MAMMOGRAM WITH IMPLANTS, CAD AND TOMOSYNTHESIS TECHNIQUE: Right screening digital craniocaudal and mediolateral oblique mammograms were obtained. Right screening digital breast tomosynthesis was performed. The images were evaluated with computer-aided detection. Standard and/or implant displaced views were performed. COMPARISON:  Previous exam(s). ACR Breast Density Category c: The breast tissue is heterogeneously dense, which may obscure small masses. FINDINGS: The patient has retropectoral implants. There are no findings suspicious for malignancy. IMPRESSION: No mammographic evidence of malignancy. A result letter of this screening mammogram will be mailed directly to the patient. RECOMMENDATION: Screening mammogram in one year. (Code:SM-B-01Y) BI-RADS CATEGORY  1:  Negative. Electronically Signed   By: Lajean Manes M.D.   On: 11/23/2020 10:16     ASSESSMENT:  1.  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  She had a mammogram performed 09/16/2015 that showed a retroareolar left breast mass measuring 1.6 cm with 3 abnormal left axillary lymph nodes noted. - She had a biopsy 09/23/2015 of the left breast mass as well as a left axillary lymph node. - Pathology reported invasive ductal  carcinoma high-grade left axillary lymph  node was positive for metastatic cancer. ER-/PR-/HER2- Ki-67 was 85%. - Bone scan on 10/26/2015 that was negative. -CT CAP was done 10/27/2015 showed the left breast mass and left axillary lymph nodes with no other evidence of metastatic disease. - Dr. Arnoldo Morale performed a left mastectomy and axillary lymph node dissection on 12/11/2015. Pathology reported invasive ductal grade 3 measuring 2.3 cm.  With 2 out of 10 lymph nodes positive for malignancy. ER-/PR-/HER2- -She was treated with 4 cycles of AC followed by 12 weeks of Taxol which was completed October 2017. -She was also treated with radiation 33 fractions which was completed in December 2017.  2.  Iron deficiency anemia: - She has history of menorrhagia. - Also received intermittent Feraheme infusions, last Feraheme on 05/22/2020.    PLAN:  1.  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  Right breast is within normal limits with no palpable masses.  No palpable adenopathy. - Reviewed right breast mammogram from 11/20/2020 which was BI-RADS Category 1. - Reviewed labs from 12/01/2020 which showed mildly elevated ALT of 52.  CBC and other LFTs are within normal limits. - She had history of elevated LFTs in the past. - Recommend follow-up in 6 months with repeat labs.  2.  Iron deficiency anemia: - Ferritin today is 13 with a hemoglobin 13.2.  She has mild tiredness. - I have recommended her to start taking iron tablet every other day with stool softeners.  We will plan to recheck it in 6 months.  3.  Vitamin D deficiency: - Vitamin D level is 22.  She will continue weekly vitamin D 50,000 units.  We will plan to repeat it in 6 months.  If it continues to be low, we will plan to add daily vitamin D 2000 units.  Orders placed this encounter:  Orders Placed This Encounter  Procedures  . CBC with  Differential/Platelet  . Ferritin  . Iron and TIBC  . VITAMIN D 25 Hydroxy (Vit-D Deficiency, Fractures)  . Comprehensive metabolic panel  . Vitamin B12     Derek Jack, MD [place] [phone]   I, Milinda Antis, am acting as a scribe for Dr. Sanda Linger.  I, Derek Jack MD, have reviewed the above documentation for accuracy and completeness, and I agree with the above.

## 2020-12-08 NOTE — Patient Instructions (Signed)
[facility] at [facility] Discharge Instructions  You were seen today by Dr. Delton Coombes. He went over your recent results. Start taking 1 iron tablet every other day and take a stool softener daily. Continue taking vitamin D every week. Dr. Delton Coombes will see you back in 6 months for labs and follow up.   Thank you for choosing [facility] at [facility] to provide your oncology and hematology care.  To afford each patient quality time with our provider, please arrive at least 15 minutes before your scheduled appointment time.   If you have a lab appointment with the [facility] please come in thru the Main Entrance and check in at the main information desk  You need to re-schedule your appointment should you arrive 10 or more minutes late.  We strive to give you quality time with our providers, and arriving late affects you and other patients whose appointments are after yours.  Also, if you no show three or more times for appointments you may be dismissed from the clinic at the providers discretion.     Again, thank you for choosing [facility].  Our hope is that these requests will decrease the amount of time that you wait before being seen by our physicians.       _____________________________________________________________  Should you have questions after your visit to [facility], please contact our office at [phone] between the hours of 8:00 a.m. and 4:30 p.m.  Voicemails left after 4:00 p.m. will not be returned until the following business day.  For prescription refill requests, have your pharmacy contact our office and allow 72 hours.    Cancer Center Support Programs:   > Cancer Support Group  2nd Tuesday of the month 1pm-2pm, Journey Room

## 2021-02-04 ENCOUNTER — Encounter (HOSPITAL_COMMUNITY): Payer: Self-pay | Admitting: Hematology

## 2021-05-04 ENCOUNTER — Encounter (HOSPITAL_COMMUNITY): Payer: Self-pay | Admitting: Hematology

## 2021-05-10 ENCOUNTER — Other Ambulatory Visit: Payer: Self-pay

## 2021-05-10 ENCOUNTER — Other Ambulatory Visit (HOSPITAL_COMMUNITY)
Admission: RE | Admit: 2021-05-10 | Discharge: 2021-05-10 | Disposition: A | Discharge status: Home / Self Care | Payer: Self-pay | Source: Ambulatory Visit | Attending: Physician Assistant | Admitting: Physician Assistant

## 2021-05-10 ENCOUNTER — Encounter: Payer: Self-pay | Admitting: Physician Assistant

## 2021-05-10 ENCOUNTER — Ambulatory Visit: Payer: Self-pay | Admitting: Physician Assistant

## 2021-05-10 VITALS — BP 129/77 | HR 82 | Temp 97.6°F | Wt 150.0 lb

## 2021-05-10 DIAGNOSIS — Z0000 Encounter for general adult medical examination without abnormal findings: Secondary | ICD-10-CM

## 2021-05-10 DIAGNOSIS — Z124 Encounter for screening for malignant neoplasm of cervix: Secondary | ICD-10-CM

## 2021-05-10 DIAGNOSIS — D509 Iron deficiency anemia, unspecified: Secondary | ICD-10-CM

## 2021-05-10 DIAGNOSIS — Z789 Other specified health status: Secondary | ICD-10-CM

## 2021-05-10 DIAGNOSIS — Z853 Personal history of malignant neoplasm of breast: Secondary | ICD-10-CM

## 2021-05-10 NOTE — Progress Notes (Signed)
BP 129/77   Pulse 82   Temp 97.6 F (36.4 C)   Wt 150 lb (68 kg)   SpO2 97%   BMI 27.66 kg/m    Subjective:    Patient ID: Michele Salas, female    DOB: [birth year], 40 y.o.   MRN: [id number]  HPI: Michele Salas is a 40 y.o. female presenting on 05/10/2021 for Gynecologic Exam and Annual Exam   HPI  Pt had a negative covid 16 screening questionnaire  Chief Complaint  Patient presents with   Annual Exam   Gynecologic Exam      Lmp sept 26   She doesn't work.  She doesn't exercise.  She sees oncologist for history of breast cancer and anemia.  She has appointment there in one month.  Pt has no complaints today.    Relevant past medical, surgical, family and social history reviewed and updated as indicated. Interim medical history since our last visit reviewed. Allergies and medications reviewed and updated.   Current Outpatient Medications:    Cyanocobalamin (VITAMIN B-12 PO), Take 1 tablet by mouth daily., Disp: , Rfl:    ferrous sulfate 325 (65 FE) MG tablet, Take 325 mg by mouth daily with breakfast., Disp: , Rfl:    Vitamin D, Ergocalciferol, (DRISDOL) 1.25 MG (50000 UNIT) CAPS capsule, Take 1 capsule (50,000 Units total) by mouth once a week., Disp: 16 capsule, Rfl: 5    Review of Systems  Per HPI unless specifically indicated above     Objective:    BP 129/77   Pulse 82   Temp 97.6 F (36.4 C)   Wt 150 lb (68 kg)   SpO2 97%   BMI 27.66 kg/m   Wt Readings from Last 3 Encounters:  05/10/21 150 lb (68 kg)  12/08/20 149 lb (67.6 kg)  05/11/20 148 lb 12 oz (67.5 kg)    Physical Exam Vitals and nursing note reviewed. Exam conducted with a chaperone present.  Constitutional:      General: She is not in acute distress.    Appearance: She is well-developed. She is not toxic-appearing.     Comments: Pt smells very strongly of perfume  HENT:     Head: Normocephalic and atraumatic.     Right Ear: Tympanic membrane, ear canal and external  ear normal.     Left Ear: Tympanic membrane, ear canal and external ear normal.  Eyes:     Extraocular Movements: Extraocular movements intact.     Conjunctiva/sclera: Conjunctivae normal.     Pupils: Pupils are equal, round, and reactive to light.  Cardiovascular:     Rate and Rhythm: Normal rate and regular rhythm.  Pulmonary:     Effort: Pulmonary effort is normal.     Breath sounds: Normal breath sounds.  Chest:  Breasts:    Right: No nipple discharge or tenderness.     Left: No nipple discharge or tenderness.     Comments: Surgical changes bilaterally with implant on left.   Abdominal:     General: Bowel sounds are normal.     Palpations: Abdomen is soft. There is no mass.     Tenderness: There is no abdominal tenderness. There is no guarding or rebound.  Genitourinary:    Labia:        Right: No rash, tenderness or lesion.        Left: No rash, tenderness or lesion.      Vagina: Normal.     Cervix: No cervical motion tenderness, discharge or  friability.     Adnexa:        Right: No mass, tenderness or fullness.         Left: No mass, tenderness or fullness.       Comments: Hildred Alamin assisted) Musculoskeletal:     Cervical back: Neck supple.     Right lower leg: No edema.     Left lower leg: No edema.  Lymphadenopathy:     Cervical: No cervical adenopathy.  Skin:    General: Skin is warm and dry.  Neurological:     Mental Status: She is alert and oriented to person, place, and time.  Psychiatric:        Behavior: Behavior normal.           Assessment & Plan:    Encounter Diagnoses  Name Primary?   Encounter for annual health examination Yes   Screening for cervical cancer    History of breast cancer    Iron deficiency anemia, unspecified iron deficiency anemia type    Not proficient in [place] language       -Encouraged covid booster and flu shots -encouraged regular exercise -pt to continue with oncologist per his recommendation -pt to follow up 1  year.  She is to contact office sooner prn

## 2021-05-11 ENCOUNTER — Ambulatory Visit: Payer: Self-pay | Admitting: Physician Assistant

## 2021-05-13 LAB — CYTOLOGY - PAP
Comment: NEGATIVE
Diagnosis: NEGATIVE
Diagnosis: REACTIVE
High risk HPV: POSITIVE — AB

## 2021-06-04 ENCOUNTER — Other Ambulatory Visit: Payer: Self-pay

## 2021-06-04 ENCOUNTER — Other Ambulatory Visit (HOSPITAL_COMMUNITY)
Admission: RE | Admit: 2021-06-04 | Discharge: 2021-06-04 | Disposition: A | Discharge status: Home / Self Care | Payer: Self-pay | Source: Ambulatory Visit | Attending: Physician Assistant | Admitting: Physician Assistant

## 2021-06-04 ENCOUNTER — Inpatient Hospital Stay (HOSPITAL_COMMUNITY): Payer: Self-pay | Attending: Hematology

## 2021-06-04 DIAGNOSIS — R69 Illness, unspecified: Secondary | ICD-10-CM | POA: Insufficient documentation

## 2021-06-04 DIAGNOSIS — D509 Iron deficiency anemia, unspecified: Secondary | ICD-10-CM | POA: Insufficient documentation

## 2021-06-04 DIAGNOSIS — C50112 Malignant neoplasm of central portion of left female breast: Secondary | ICD-10-CM | POA: Insufficient documentation

## 2021-06-04 LAB — COMPREHENSIVE METABOLIC PANEL
ALT: 65 U/L — ABNORMAL HIGH (ref 0–44)
AST: 40 U/L (ref 15–41)
Albumin: 4.2 g/dL (ref 3.5–5.0)
Alkaline Phosphatase: 92 U/L (ref 38–126)
Anion gap: 5 (ref 5–15)
BUN: 8 mg/dL (ref 6–20)
CO2: 25 mmol/L (ref 22–32)
Calcium: 9 mg/dL (ref 8.9–10.3)
Chloride: 106 mmol/L (ref 98–111)
Creatinine, Ser: 0.44 mg/dL (ref 0.44–1.00)
GFR, Estimated: >60 mL/min (ref >60)
Glucose, Bld: 118 mg/dL — ABNORMAL HIGH (ref 70–99)
Potassium: 3.7 mmol/L (ref 3.5–5.1)
Sodium: 136 mmol/L (ref 135–145)
Total Bilirubin: 0.7 mg/dL (ref 0.3–1.2)
Total Protein: 7.8 g/dL (ref 6.5–8.1)

## 2021-06-04 LAB — CBC WITH DIFFERENTIAL/PLATELET
Abs Immature Granulocytes: 0.02 10*3/uL (ref 0.00–0.07)
Basophils Absolute: 0 10*3/uL (ref 0.0–0.1)
Basophils Relative: 1 %
Eosinophils Absolute: 0.1 10*3/uL (ref 0.0–0.5)
Eosinophils Relative: 2 %
HCT: 39.8 % (ref 36.0–46.0)
Hemoglobin: 13.5 g/dL (ref 12.0–15.0)
Immature Granulocytes: 0 %
Lymphocytes Relative: 30 %
Lymphs Abs: 1.7 10*3/uL (ref 0.7–4.0)
MCH: 29.9 pg (ref 26.0–34.0)
MCHC: 33.9 g/dL (ref 30.0–36.0)
MCV: 88.2 fL (ref 80.0–100.0)
Monocytes Absolute: 0.4 10*3/uL (ref 0.1–1.0)
Monocytes Relative: 7 %
Neutro Abs: 3.4 10*3/uL (ref 1.7–7.7)
Neutrophils Relative %: 60 %
Platelets: 331 10*3/uL (ref 150–400)
RBC: 4.51 MIL/uL (ref 3.87–5.11)
RDW: 14.3 % (ref 11.5–15.5)
WBC: 5.7 10*3/uL (ref 4.0–10.5)
nRBC: 0 % (ref 0.0–0.2)

## 2021-06-04 LAB — IRON AND TIBC
Iron: 64 ug/dL (ref 28–170)
Saturation Ratios: 14 % (ref 10.4–31.8)
TIBC: 451 ug/dL — ABNORMAL HIGH (ref 250–450)
UIBC: 387 ug/dL

## 2021-06-04 LAB — VITAMIN D 25 HYDROXY (VIT D DEFICIENCY, FRACTURES): Vit D, 25-Hydroxy: 13.81 ng/mL — ABNORMAL LOW (ref 30–100)

## 2021-06-04 LAB — FERRITIN: Ferritin: 22 ng/mL (ref 11–307)

## 2021-06-04 LAB — VITAMIN B12: Vitamin B-12: 296 pg/mL (ref 180–914)

## 2021-06-09 NOTE — Progress Notes (Signed)
[facility] [address] [place] [address]   Patient Care Team: [name], PA-C as PCP - General (Physician Assistant) [name], PA-C (Physician Assistant)  SUMMARY OF ONCOLOGIC HISTORY: Oncology History  Cancer of central portion of left female breast ([place])  09/16/2015 Mammogram   1.6 cm retroareolar left breast mass measuring 1.6 cm in largest diameter in addition to 3 abnormal appearing left inferior axillary lymph nodes.   09/23/2015 Initial Biopsy   US guided core biopsy of left breast mass and suspicious left axillary lymph nodes.   09/23/2015 Pathology Results   Invasive ductal carcinoma, high grade.  Left axilla lymph node is positive for metastatic carcinoma.  ER/PR NEGATIVE, HER2 NEGATIVE, KI-67 marker of 85%.   10/26/2015 Imaging   Bone scan- No evidence of metastatic disease to the skeleton.   10/27/2015 Imaging   CT CAP-  Retroareolar mass with 2 asymmetrically prominent left axillary lymph nodes and an upper normal size left subpectoral lymph node. No other findings of metastatic disease in the chest, abdomen, or pelvis.   11/05/2015 Echocardiogram   The estimated ejection   fraction was in the range of 60% to 65%.   11/23/2015 Miscellaneous   Genetic Counseling- genetic test result was negative for any known pathogenic mutations within any of 28 genes that would cause her to be at an increased genetic risk for breast, ovarian, or other related cancers.   12/09/2015 Surgery   L radical mastectomy, axillary LN dissection, port a cath insertion with Dr. Aviva Signs 12/09/2015   12/11/2015 Pathology Results   - INVASIVE GRADE III DUCTAL CARCINOMA, SPANNING 2.3 CM IN GREATEST DIMENSION. - ASSOCIATED HIGH GRADE DCIS - MARGINS ARE NEGATIVE. 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 - EIGHT ADDITIONAL LYMPH NODES WITH NO TUMOR SEEN   12/11/2015 Pathology Results   HER2 NEGATIVE, ER 0%, PR 0%, Ki-67 marker 90%   12/25/2015  - 02/12/2016 Chemotherapy   AC every 2 weeks x 4 cycles   02/04/2016 Treatment Plan Change   Treatment deferred x 7 days due to neutropenia   02/25/2016 - 05/12/2016 Chemotherapy   Paclitaxel weekly x 12   06/01/2016 - 07/19/2016 Radiation Therapy   33 fractions, Dr. Lisbeth Renshaw in [place]   02/06/2017 Surgery   (L) breast reconstruction (lat flap with tissue expander) with Dr. Iran Planas     CHIEF COMPLIANT: Follow-up for IDA and left breast cancer   INTERVAL HISTORY: [name] is a 40 y.o. female here today for follow up of her IDA and left breast cancer. Her last visit was on 12/08/2020.   Today she reports feeling fair. She denies any current bleeding and black stools. She continues to take 1 iron tablet daily. She reports fatigue. She is not currently taking vitamin D.   REVIEW OF SYSTEMS:   Review of Systems  Constitutional:  Positive for fatigue (25%). Negative for appetite change (75%\).  HENT:   Negative for nosebleeds.   Respiratory:  Negative for hemoptysis.   Gastrointestinal:  Negative for blood in stool.  Genitourinary:  Negative for hematuria and vaginal bleeding.   Hematological:  Does not bruise/bleed easily.  All other systems reviewed and are negative.  I have reviewed the past medical history, past surgical history, social history and family history with the patient and they are unchanged from previous note.   ALLERGIES:   has No Known Allergies.   MEDICATIONS:  Current Outpatient Medications  Medication Sig Dispense Refill  Cyanocobalamin (VITAMIN B-12 PO) Take 1 tablet by mouth daily.     ferrous sulfate 325 (65 FE) MG tablet Take 325 mg by mouth daily with breakfast.     Vitamin D, Ergocalciferol, (DRISDOL) 1.25 MG (50000 UNIT) CAPS capsule Take 1 capsule (50,000 Units total) by mouth once a week. 16 capsule 5   No current facility-administered medications for this visit.     PHYSICAL EXAMINATION: Performance status (ECOG): 1 - Symptomatic but  completely ambulatory  There were no vitals filed for this visit. Wt Readings from Last 3 Encounters:  05/10/21 150 lb (68 kg)  12/08/20 149 lb (67.6 kg)  05/11/20 148 lb 12 oz (67.5 kg)   Physical Exam Vitals reviewed.  Constitutional:      Appearance: Normal appearance.  Cardiovascular:     Rate and Rhythm: Normal rate and regular rhythm.     Pulses: Normal pulses.     Heart sounds: Normal heart sounds.  Pulmonary:     Effort: Pulmonary effort is normal.     Breath sounds: Normal breath sounds.  Chest:  Breasts:    Right: Normal. No mass.     Left: Absent. Skin change: implant within normal limits.  Musculoskeletal:     Right lower leg: No edema.     Left lower leg: No edema.  Lymphadenopathy:     Upper Body:     Right upper body: No supraclavicular, axillary or pectoral adenopathy.     Left upper body: No supraclavicular, axillary or pectoral adenopathy.  Neurological:     General: No focal deficit present.     Mental Status: She is alert and oriented to person, place, and time.  Psychiatric:        Mood and Affect: Mood normal.        Behavior: Behavior normal.    Breast Exam Chaperone: Thana Ates     LABORATORY DATA:  I have reviewed the data as listed CMP Latest Ref Rng & Units 06/04/2021 12/01/2020 05/06/2020  Glucose 70 - 99 mg/dL 118(H) 129(H) 118(H)  BUN 6 - 20 mg/dL _0 Creatinine 0.44 - 1.00 mg/dL 0.44 0.47 0.50  Sodium 135 - 145 mmol/L 136 136 138  Potassium 3.5 - 5.1 mmol/L 3.7 3.8 3.9  Chloride 98 - 111 mmol/L 106 106 106  CO2 22 - 32 mmol/L _1 Calcium 8.9 - 10.3 mg/dL 9.0 9.3 9.0  Total Protein 6.5 - 8.1 g/dL 7.8 7.3 7.4  Total Bilirubin 0.3 - 1.2 mg/dL 0.7 0.6 0.6  Alkaline Phos 38 - 126 U/L 92 87 85  AST 15 - 41 U/L 40 33 20  ALT 0 - 44 U/L 65(H) 52(H) 25   No results found for: [id number] Lab Results  Component Value Date   WBC 5.7 06/04/2021   HGB 13.5 06/04/2021   HCT 39.8 06/04/2021   MCV 88.2 06/04/2021   PLT 331  06/04/2021   NEUTROABS 3.4 06/04/2021    ASSESSMENT:  1.  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  She had a mammogram performed 09/16/2015 that showed a retroareolar left breast mass measuring 1.6 cm with 3 abnormal left axillary lymph nodes noted. - She had a biopsy 09/23/2015 of the left breast mass as well as a left axillary lymph node. - Pathology reported invasive ductal carcinoma high-grade left axillary lymph node was positive for metastatic cancer. ER-/PR-/HER2- Ki-67 was 85%. - Bone scan on 10/26/2015 that  was negative. -CT CAP was done 10/27/2015 showed the left breast mass and left axillary lymph nodes with no other evidence of metastatic disease. - Dr. Arnoldo Morale performed a left mastectomy and axillary lymph node dissection on 12/11/2015. Pathology reported invasive ductal grade 3 measuring 2.3 cm.  With 2 out of 10 lymph nodes positive for malignancy. ER-/PR-/HER2- -She was treated with 4 cycles of AC followed by 12 weeks of Taxol which was completed October 2017. -She was also treated with radiation 33 fractions which was completed in December 2017.   2.  Iron deficiency anemia: - She has history of menorrhagia. - Also received intermittent Feraheme infusions, last Feraheme on 05/22/2020.   PLAN:  1.  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  Right breast has no palpable masses.  No palpable adenopathy. - Last right breast mammogram on 11/20/2020 was BI-RADS Category 1. - Reviewed labs today which showed mildly elevated ALT of 65 and stable. - We will schedule for right breast mammogram after 11/30/2021. - RTC 6 months for follow-up with repeat labs.   2.  Iron deficiency anemia: - She is taking iron tablet once daily.  She has mild constipation. - Reviewed labs today which showed hemoglobin 13.5.  Ferritin is 22. - Continue iron tablet  daily.   3.  Vitamin D deficiency: - Vitamin D is low at 13.8. - She is not taking vitamin D. - She was told to restart back on vitamin D 50,000 units weekly.  We have sent a prescription.  Breast Cancer therapy associated bone loss: I have recommended calcium, Vitamin D and weight bearing exercises.  Orders placed this encounter:  No orders of the defined types were placed in this encounter.   The patient has a good understanding of the overall plan. She agrees with it. She will call with any problems that may develop before the next visit here.  Derek Jack, MD [place] [phone]   I, Thana Ates, am acting as a scribe for Dr. Derek Jack.  I, Derek Jack MD, have reviewed the above documentation for accuracy and completeness, and I agree with the above.

## 2021-06-10 ENCOUNTER — Other Ambulatory Visit: Payer: Self-pay

## 2021-06-10 ENCOUNTER — Inpatient Hospital Stay (HOSPITAL_COMMUNITY): Payer: Self-pay | Attending: Hematology | Admitting: Hematology

## 2021-06-10 VITALS — BP 147/91 | HR 66 | Temp 97.3°F | Resp 18 | Wt 150.5 lb

## 2021-06-10 DIAGNOSIS — Z9882 Breast implant status: Secondary | ICD-10-CM | POA: Insufficient documentation

## 2021-06-10 DIAGNOSIS — M8580 Other specified disorders of bone density and structure, unspecified site: Secondary | ICD-10-CM | POA: Insufficient documentation

## 2021-06-10 DIAGNOSIS — Z923 Personal history of irradiation: Secondary | ICD-10-CM | POA: Insufficient documentation

## 2021-06-10 DIAGNOSIS — Z171 Estrogen receptor negative status [ER-]: Secondary | ICD-10-CM

## 2021-06-10 DIAGNOSIS — Z9221 Personal history of antineoplastic chemotherapy: Secondary | ICD-10-CM | POA: Insufficient documentation

## 2021-06-10 DIAGNOSIS — Z9012 Acquired absence of left breast and nipple: Secondary | ICD-10-CM | POA: Insufficient documentation

## 2021-06-10 DIAGNOSIS — C50112 Malignant neoplasm of central portion of left female breast: Secondary | ICD-10-CM

## 2021-06-10 DIAGNOSIS — E559 Vitamin D deficiency, unspecified: Secondary | ICD-10-CM

## 2021-06-10 DIAGNOSIS — Z853 Personal history of malignant neoplasm of breast: Secondary | ICD-10-CM | POA: Insufficient documentation

## 2021-06-10 DIAGNOSIS — D509 Iron deficiency anemia, unspecified: Secondary | ICD-10-CM

## 2021-06-10 DIAGNOSIS — R7401 Elevation of levels of liver transaminase levels: Secondary | ICD-10-CM | POA: Insufficient documentation

## 2021-06-10 MED ORDER — VITAMIN D (ERGOCALCIFEROL) 1.25 MG (50000 UNIT) PO CAPS: 50000.0000 [IU] | ORAL_CAPSULE | ORAL | 3 refills | Status: DC

## 2021-06-10 NOTE — Patient Instructions (Signed)
[facility] at [facility] Discharge Instructions  You were seen and examined today by Dr. Delton Coombes. We refilled your Vitamin D prescription.  Restart as soon as you get them. Continue iron tablets. Return as scheduled.    Thank you for choosing [place] at [facility] to provide your oncology and hematology care.  To afford each patient quality time with our provider, please arrive at least 15 minutes before your scheduled appointment time.   If you have a lab appointment with the [facility] please come in thru the Main Entrance and check in at the main information desk.  You need to re-schedule your appointment should you arrive 10 or more minutes late.  We strive to give you quality time with our providers, and arriving late affects you and other patients whose appointments are after yours.  Also, if you no show three or more times for appointments you may be dismissed from the clinic at the providers discretion.     Again, thank you for choosing [facility].  Our hope is that these requests will decrease the amount of time that you wait before being seen by our physicians.       _____________________________________________________________  Should you have questions after your visit to [facility], please contact our office at [phone] and follow the prompts.  Our office hours are 8:00 a.m. and 4:30 p.m. Monday - Friday.  Please note that voicemails left after 4:00 p.m. may not be returned until the following business day.  We are closed weekends and major holidays.  You do have access to a nurse 24-7, just call the main number to the clinic [phone] and do not press any options, hold on the line and a nurse will answer the phone.    For prescription refill requests, have your pharmacy contact our office and allow 72 hours.    Due to Covid, you will need to wear a mask upon entering the hospital. If you  do not have a mask, a mask will be given to you at the Main Entrance upon arrival. For doctor visits, patients may have 1 support person age 58 or older with them. For treatment visits, patients can not have anyone with them due to social distancing guidelines and our immunocompromised population.

## 2021-12-03 ENCOUNTER — Ambulatory Visit (HOSPITAL_COMMUNITY)
Admission: RE | Admit: 2021-12-03 | Discharge: 2021-12-03 | Disposition: A | Discharge status: Home / Self Care | Payer: Self-pay | Source: Ambulatory Visit | Attending: Hematology | Admitting: Hematology

## 2021-12-03 ENCOUNTER — Other Ambulatory Visit (HOSPITAL_COMMUNITY): Payer: Self-pay | Admitting: Hematology

## 2021-12-03 ENCOUNTER — Inpatient Hospital Stay (HOSPITAL_COMMUNITY): Payer: Self-pay | Attending: Hematology

## 2021-12-03 DIAGNOSIS — C50112 Malignant neoplasm of central portion of left female breast: Secondary | ICD-10-CM

## 2021-12-03 DIAGNOSIS — Z9882 Breast implant status: Secondary | ICD-10-CM | POA: Insufficient documentation

## 2021-12-03 DIAGNOSIS — E559 Vitamin D deficiency, unspecified: Secondary | ICD-10-CM | POA: Insufficient documentation

## 2021-12-03 DIAGNOSIS — Z1231 Encounter for screening mammogram for malignant neoplasm of breast: Secondary | ICD-10-CM | POA: Insufficient documentation

## 2021-12-03 DIAGNOSIS — Z171 Estrogen receptor negative status [ER-]: Secondary | ICD-10-CM | POA: Insufficient documentation

## 2021-12-03 DIAGNOSIS — D509 Iron deficiency anemia, unspecified: Secondary | ICD-10-CM | POA: Insufficient documentation

## 2021-12-03 LAB — COMPREHENSIVE METABOLIC PANEL
ALT: 78 U/L — ABNORMAL HIGH (ref 0–44)
AST: 48 U/L — ABNORMAL HIGH (ref 15–41)
Albumin: 4.4 g/dL (ref 3.5–5.0)
Alkaline Phosphatase: 86 U/L (ref 38–126)
Anion gap: 7 (ref 5–15)
BUN: 11 mg/dL (ref 6–20)
CO2: 24 mmol/L (ref 22–32)
Calcium: 9.5 mg/dL (ref 8.9–10.3)
Chloride: 106 mmol/L (ref 98–111)
Creatinine, Ser: 0.48 mg/dL (ref 0.44–1.00)
GFR, Estimated: >60 mL/min (ref >60)
Glucose, Bld: 101 mg/dL — ABNORMAL HIGH (ref 70–99)
Potassium: 4.1 mmol/L (ref 3.5–5.1)
Sodium: 137 mmol/L (ref 135–145)
Total Bilirubin: 0.7 mg/dL (ref 0.3–1.2)
Total Protein: 7.8 g/dL (ref 6.5–8.1)

## 2021-12-03 LAB — CBC WITH DIFFERENTIAL/PLATELET
Abs Immature Granulocytes: 0.07 10*3/uL (ref 0.00–0.07)
Basophils Absolute: 0.1 10*3/uL (ref 0.0–0.1)
Basophils Relative: 1 %
Eosinophils Absolute: 0.4 10*3/uL (ref 0.0–0.5)
Eosinophils Relative: 4 %
HCT: 40.9 % (ref 36.0–46.0)
Hemoglobin: 13.6 g/dL (ref 12.0–15.0)
Immature Granulocytes: 1 %
Lymphocytes Relative: 28 %
Lymphs Abs: 2.4 10*3/uL (ref 0.7–4.0)
MCH: 30 pg (ref 26.0–34.0)
MCHC: 33.3 g/dL (ref 30.0–36.0)
MCV: 90.3 fL (ref 80.0–100.0)
Monocytes Absolute: 0.8 10*3/uL (ref 0.1–1.0)
Monocytes Relative: 9 %
Neutro Abs: 4.9 10*3/uL (ref 1.7–7.7)
Neutrophils Relative %: 57 %
Platelets: 322 10*3/uL (ref 150–400)
RBC: 4.53 MIL/uL (ref 3.87–5.11)
RDW: 12.9 % (ref 11.5–15.5)
WBC: 8.5 10*3/uL (ref 4.0–10.5)
nRBC: 0 % (ref 0.0–0.2)

## 2021-12-03 LAB — IRON AND TIBC
Iron: 85 ug/dL (ref 28–170)
Saturation Ratios: 19 % (ref 10.4–31.8)
TIBC: 444 ug/dL (ref 250–450)
UIBC: 359 ug/dL

## 2021-12-03 LAB — FERRITIN: Ferritin: 60 ng/mL (ref 11–307)

## 2021-12-04 LAB — VITAMIN D 25 HYDROXY (VIT D DEFICIENCY, FRACTURES): Vit D, 25-Hydroxy: 64.79 ng/mL (ref 30–100)

## 2021-12-13 ENCOUNTER — Ambulatory Visit (HOSPITAL_COMMUNITY): Payer: Self-pay | Admitting: Hematology

## 2021-12-17 NOTE — Progress Notes (Addendum)
? ?[facility] ?[address] ?[place], [place] [address] ? ? ?CLINIC:  ?Medical Oncology/Hematology ? ?PCP:  ?Michele Dryer, PA-C ?[address] / [place] [address] ?[phone] ? ? ?REASON FOR VISIT:  ?Follow-up for history of left breast triple negative breast Salas and iron deficiency anemia ? ?BRIEF ONCOLOGIC HISTORY:  ?Oncology History  ?Salas of central portion of left female breast ([place])  ?09/16/2015 Mammogram  ? 1.6 cm retroareolar left breast mass measuring 1.6 cm in largest diameter in addition to 3 abnormal appearing left inferior axillary lymph nodes. ? ?  ?09/23/2015 Initial Biopsy  ? US guided core biopsy of left breast mass and suspicious left axillary lymph nodes. ? ?  ?09/23/2015 Pathology Results  ? Invasive ductal carcinoma, high grade.  Left axilla lymph node is positive for metastatic carcinoma.  ER/PR NEGATIVE, HER2 NEGATIVE, KI-67 marker of 85%. ? ?  ?10/26/2015 Imaging  ? Bone scan- No evidence of metastatic disease to the skeleton. ? ?  ?10/27/2015 Imaging  ? CT CAP-  Retroareolar mass with 2 asymmetrically prominent left axillary lymph nodes and an upper normal size left subpectoral lymph node. No other findings of metastatic disease in the chest, abdomen, or pelvis. ? ?  ?11/05/2015 Echocardiogram  ? The estimated ejection   fraction was in the range of 60% to 65%. ? ?  ?11/23/2015 Miscellaneous  ? Genetic Counseling- genetic test result was negative for any known pathogenic mutations within any of 28 genes that would cause her to be at an increased genetic risk for breast, ovarian, or other related cancers. ? ?  ?12/09/2015 Surgery  ? L radical mastectomy, axillary LN dissection, port a cath insertion with Dr. Aviva Salas 12/09/2015 ? ?  ?12/11/2015 Pathology Results  ? - INVASIVE GRADE III DUCTAL CARCINOMA, SPANNING 2.3 CM IN GREATEST DIMENSION. - ASSOCIATED HIGH GRADE DCIS - MARGINS ARE NEGATIVE. 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 - EIGHT  ADDITIONAL LYMPH NODES WITH NO TUMOR SEEN ? ?  ?12/11/2015 Pathology Results  ? HER2 NEGATIVE, ER 0%, PR 0%, Ki-67 marker 90% ? ?  ?12/25/2015 - 02/12/2016 Chemotherapy  ? AC every 2 weeks x 4 cycles ? ?  ?02/04/2016 Treatment Plan Change  ? Treatment deferred x 7 days due to neutropenia ? ?  ?02/25/2016 - 05/12/2016 Chemotherapy  ? Paclitaxel weekly x 12 ?  ?06/01/2016 - 07/19/2016 Radiation Therapy  ? 33 fractions, Dr. Lisbeth Salas in [place] ? ?  ?02/06/2017 Surgery  ? (L) breast reconstruction (lat flap with tissue expander) with Dr. Iran Salas ? ?  ? ? ?Salas STAGING: ?Salas Staging  ?Salas of central portion of left female breast ([place]) ?Staging form: Breast, AJCC 7th Edition ?- Pathologic stage from 12/11/2015: Stage IIB (T2, N1a, cM0) - Signed by Michele Cancer, PA-C on 12/31/2015 ? ? ?INTERVAL HISTORY:  ?[name], a 41 y.o. female, returns for routine follow-up of her history of left breast triple negative breast Salas and iron deficiency anemia. Michele Salas was last seen on 06/10/2021 by Dr. Delton Salas.  ? ?At today's visit, she  reports feeling well.  She denies any recent hospitalizations, surgeries, or changes in her  baseline health status. ? ?Most recent mammogram on 12/06/2021 showed no evidence of malignancy. ? ?She denies any symptoms of recurrence such as new lumps or new pain.  (She does report some chronic bone and joint pain ever since she had chemotherapy.)  She denies any new chest pain, dyspnea, or abdominal pain.  She has no new headaches, seizures, or focal neurologic deficits.  No B symptoms such as fever, chills, night sweats, unintentional weight loss. ? ?She is taking iron tablet once daily with some mild to moderate constipation that has not been relieved by MiraLAX.  She reports that she continues to have heavy menstrual periods.  She reports the iron makes her bowel movements dark, but she denies any gross melena or hematochezia.  She has some mild fatigue as well as pica, restless legs, and  headaches. ?She continues to take vitamin D 50,000 units weekly.   ? ?She reports 60% energy and 100% appetite.  She is maintaining stable weight at this time. ? ? ?REVIEW OF SYSTEMS:  ?Review of Systems  ?Constitutional:  Positive for fatigue. Negative for appetite change, chills, diaphoresis, fever and unexpected weight change.  ?HENT:   Negative for lump/mass and nosebleeds.   ?Eyes:  Negative for eye problems.  ?Respiratory:  Negative for cough, hemoptysis and shortness of breath.   ?Cardiovascular:  Negative for chest pain, leg swelling and palpitations.  ?Gastrointestinal:  Positive for constipation. Negative for abdominal pain, blood in stool, diarrhea, nausea and vomiting.  ?Endocrine: Positive for hot flashes.  ?Genitourinary:  Positive for menstrual problem (menorrhagia). Negative for hematuria.   ?Skin: Negative.   ?Neurological:  Positive for headaches. Negative for dizziness and light-headedness.  ?Hematological:  Does not bruise/bleed easily.  ?Psychiatric/Behavioral:  Positive for sleep disturbance.   ? ?PAST MEDICAL/SURGICAL HISTORY:  ?Past Medical History:  ?Diagnosis Date  ? History of breast Salas 10/2015  ? left  ? History of chemotherapy 2017  ? History of radiation therapy   ? PONV (postoperative nausea and vomiting)   ? nausea only  ? ?Past Surgical History:  ?Procedure Laterality Date  ? BREAST ENHANCEMENT SURGERY Right 05/19/2017  ? Procedure: Augmentation Right Breast with Saline Implant for symmetry;  Surgeon: Michele Limbo, MD;  Location: [facility];  Service: Plastics;  Laterality: Right;  ? LATISSIMUS FLAP TO BREAST Left 02/06/2017  ? Procedure: LEFT LATISSIMUS FLAP TO BREAST;  Surgeon: Michele Limbo, MD;  Location: [facility];  Service: Plastics;  Laterality: Left;  ? MASTECTOMY MODIFIED RADICAL Left 12/09/2015  ? Procedure: LEFT MODIFIED RADICAL MASTECTOMY;  Surgeon: Michele Signs, MD;  Location: [facility];  Service: General;  Laterality: Left;  ? MASTOPEXY Right 12/22/2017   ? Procedure: RIGHT MASTOPEXY;  Surgeon: Michele Limbo, MD;  Location: [facility];  Service: Plastics;  Laterality: Right;  ? PORT-A-CATH REMOVAL Right 05/19/2017  ? Procedure: REMOVAL PORT-A-CATH;  Surgeon: Michele Limbo, MD;  Location: [facility];  Service: Plastics;  Laterality: Right;  ? PORTACATH PLACEMENT Right 12/09/2015  ? Procedure: INSERTION PORT-A-CATH RIGHT SUBCLAVIAN;  Surgeon: Michele Signs, MD;  Location: [facility];  Service: General;  Laterality: Right;  ? REMOVAL OF TISSUE EXPANDER AND PLACEMENT OF IMPLANT Left 05/19/2017  ? Procedure: REMOVAL OF LEFT TISSUE EXPANDER AND PLACEMENT OF SALINE IMPLANT;  Surgeon: Michele Limbo, MD;  Location: [facility];  Service: Plastics;  Laterality: Left;  ? SKIN FULL THICKNESS GRAFT Left 12/22/2017  ? Procedure: LEFT NIPPLE AREOLAR COMPLEX RECONSTRUCTION WITH LOCAL FLAP  AND FULL THICKNESS SKIN GRAFT FROM LEFT GROIN;  Surgeon: Michele Limbo, MD;  Location: [facility];  Service: Plastics;  Laterality: Left;  ? TISSUE EXPANDER PLACEMENT Left 02/06/2017  ? Procedure: TISSUE EXPANDER PLACEMENT LEFT BREAST;  Surgeon: Michele Limbo, MD;  Location: [facility];  Service: Plastics;  Laterality: Left;  ? ? ?  SOCIAL HISTORY:  ?Social History  ? ?Socioeconomic History  ? Marital status: Married  ?  Spouse name: Not on file  ? Number of children: Not on file  ? Years of education: Not on file  ? Highest education level: Not on file  ?Occupational History  ? Not on file  ?Tobacco Use  ? Smoking status: Never  ? Smokeless tobacco: Never  ?Vaping Use  ? Vaping Use: Never used  ?Substance and Sexual Activity  ? Alcohol use: No  ? Drug use: No  ? Sexual activity: Yes  ?  Birth control/protection: Condom  ?Other Topics Concern  ? Not on file  ?Social History Narrative  ? ** Merged History Encounter **  ?    ? ?Social Determinants of Health  ? ?Financial Resource Strain: Not on file  ?Food Insecurity: Not on file   ?Transportation Needs: Not on file  ?Physical Activity: Not on file  ?Stress: Not on file  ?Social Connections: Not on file  ?Intimate Partner Violence: Not on file  ? ? ?FAMILY HISTORY:  ?Family History  ?Problem Re

## 2021-12-20 ENCOUNTER — Inpatient Hospital Stay (HOSPITAL_COMMUNITY): Payer: Self-pay | Attending: Hematology | Admitting: Physician Assistant

## 2021-12-20 VITALS — BP 126/83 | HR 87 | Temp 98.1°F | Resp 18 | Wt 154.1 lb

## 2021-12-20 DIAGNOSIS — C50112 Malignant neoplasm of central portion of left female breast: Secondary | ICD-10-CM

## 2021-12-20 DIAGNOSIS — Z853 Personal history of malignant neoplasm of breast: Secondary | ICD-10-CM | POA: Insufficient documentation

## 2021-12-20 DIAGNOSIS — E559 Vitamin D deficiency, unspecified: Secondary | ICD-10-CM | POA: Insufficient documentation

## 2021-12-20 DIAGNOSIS — R7401 Elevation of levels of liver transaminase levels: Secondary | ICD-10-CM | POA: Insufficient documentation

## 2021-12-20 DIAGNOSIS — Z923 Personal history of irradiation: Secondary | ICD-10-CM | POA: Insufficient documentation

## 2021-12-20 DIAGNOSIS — Z171 Estrogen receptor negative status [ER-]: Secondary | ICD-10-CM

## 2021-12-20 DIAGNOSIS — Z9221 Personal history of antineoplastic chemotherapy: Secondary | ICD-10-CM | POA: Insufficient documentation

## 2021-12-20 DIAGNOSIS — D509 Iron deficiency anemia, unspecified: Secondary | ICD-10-CM | POA: Insufficient documentation

## 2021-12-20 DIAGNOSIS — Z9012 Acquired absence of left breast and nipple: Secondary | ICD-10-CM | POA: Insufficient documentation

## 2021-12-20 MED ORDER — VITAMIN D (ERGOCALCIFEROL) 1.25 MG (50000 UNIT) PO CAPS: 50000.0000 [IU] | ORAL_CAPSULE | ORAL | 3 refills | Status: DC

## 2021-12-20 NOTE — Patient Instructions (Signed)
[facility] at [facility] ?Discharge Instructions ? ?You were seen today by Tarri Abernethy PA-C for your follow-up visit. ? ?HISTORY OF BREAST CANCER: ?- Your most recent mammogram looks good. ?- Your exam today did not show any signs of recurrent breast cancer. ?- You did have some slightly elevated liver levels on your labs, so I will check an ULTRASOUND of your LIVER to make sure you do not have any liver disease that we should be concerned about.  (We will call you to discuss these results over the phone.) ? ?IRON DEFICIENCY: ?- Continue to take your iron tablet once daily. ?- Take your iron with a glass of orange juice to help your body absorb it better. ?- Due to your constipation from the iron pill, I recommend that you take over-the-counter stool softener COLACE (also called DOCUSATE) 2 capsules each night to keep your bowel movements regular. ? ?VITAMIN D DEFICIENCY: I have sent a refill prescription to your pharmacy for your vitamin D 50,000 units each week. ? ?OTHER TESTS: Ultrasound of liver ? ?LABS: Return in 6 months for repeat labs ? ?FOLLOW-UP APPOINTMENT: Office visit in 6 months, after labs ? ? ?Thank you for choosing [place] at [facility] to provide your oncology and hematology care.  To afford each patient quality time with our provider, please arrive at least 15 minutes before your scheduled appointment time.  ? ?If you have a lab appointment with the [facility] please come in thru the Main Entrance and check in at the main information desk. ? ?You need to re-schedule your appointment should you arrive 10 or more minutes late.  We strive to give you quality time with our providers, and arriving late affects you and other patients whose appointments are after yours.  Also, if you no show three or more times for appointments you may be dismissed from the clinic at the providers discretion.     ?Again, thank you for choosing [facility].  Our hope is that these requests will decrease the amount of time that you wait before being seen by our physicians.       ?_____________________________________________________________ ? ?Should you have questions after your visit to [facility], please contact our office at [phone] and follow the prompts.  Our office hours are 8:00 a.m. and 4:30 p.m. Monday - Friday.  Please note that voicemails left after 4:00 p.m. may not be returned until the following business day.  We are closed weekends and major holidays.  You do have access to a nurse 24-7, just call the main number to the clinic [phone] and do not press any options, hold on the line and a nurse will answer the phone.   ? ?For prescription refill requests, have your pharmacy contact our office and allow 72 hours.   ? ?Due to Covid, you will need to wear a mask upon entering the hospital. If you do not have a mask, a mask will be given to you at the Main Entrance upon arrival. For doctor visits, patients may have 1 support person age 28 or older with them. For treatment visits, patients can not have anyone with them due to social distancing guidelines and our immunocompromised population.  ? ? ? ?

## 2021-12-31 ENCOUNTER — Ambulatory Visit (HOSPITAL_COMMUNITY)
Admission: RE | Admit: 2021-12-31 | Discharge: 2021-12-31 | Disposition: A | Discharge status: Home / Self Care | Payer: Self-pay | Source: Ambulatory Visit | Attending: Physician Assistant | Admitting: Physician Assistant

## 2021-12-31 DIAGNOSIS — R7401 Elevation of levels of liver transaminase levels: Secondary | ICD-10-CM | POA: Insufficient documentation

## 2022-01-05 ENCOUNTER — Telehealth (HOSPITAL_COMMUNITY): Payer: Self-pay | Admitting: *Deleted

## 2022-01-05 NOTE — Telephone Encounter (Signed)
Spoke to patient using interpreter line.  Agent # [phone] assisted with the call.  Results and suggestions from [place] in reference to  abdominal ultrasound given to patient and all questions were answered.  Verbalized understanding.

## 2022-05-12 ENCOUNTER — Ambulatory Visit: Payer: Self-pay | Admitting: Physician Assistant

## 2022-05-19 ENCOUNTER — Ambulatory Visit: Payer: Self-pay | Admitting: Physician Assistant

## 2022-05-24 ENCOUNTER — Ambulatory Visit: Payer: Self-pay | Admitting: Physician Assistant

## 2022-05-24 ENCOUNTER — Encounter: Payer: Self-pay | Admitting: Physician Assistant

## 2022-05-24 VITALS — BP 112/76 | HR 67 | Temp 97.8°F | Ht 61.5 in | Wt 144.8 lb

## 2022-05-24 DIAGNOSIS — Z853 Personal history of malignant neoplasm of breast: Secondary | ICD-10-CM

## 2022-05-24 DIAGNOSIS — R109 Unspecified abdominal pain: Secondary | ICD-10-CM

## 2022-05-24 DIAGNOSIS — Z0000 Encounter for general adult medical examination without abnormal findings: Secondary | ICD-10-CM

## 2022-05-24 DIAGNOSIS — Z789 Other specified health status: Secondary | ICD-10-CM

## 2022-05-24 MED ORDER — PANTOPRAZOLE SODIUM 40 MG PO TBEC: 40.0000 mg | DELAYED_RELEASE_TABLET | Freq: Two times a day (BID) | ORAL | 0 refills | Status: DC

## 2022-05-24 NOTE — Progress Notes (Unsigned)
BP 112/76   Pulse 67   Temp 97.8 F (36.6 C)   Ht 5' 1.5" (1.562 m)   Wt 144 lb 12 oz (65.7 kg)   SpO2 99%   BMI 26.91 kg/m    Subjective:    Patient ID: Michele Salas, female    DOB: 1980-12-07, 41 y.o.   MRN: [id number]  HPI: Michele Salas is a 41 y.o. female presenting on 05/24/2022 for Annual Exam   HPI   Chief Complaint  Patient presents with   Annual Exam    She sees her oncologist every 6 months for history breast cancer.  She says she is having pain in Her stomach. she feels very gassy and bloated.   It sometimes hurts.   She doesn't pass the gass.    She says it hurts every day.  Maybe 5 or 6 hours each day- for about 2 hours after each meal.  She has taken nothing for self treatment of this pain.      Relevant past medical, surgical, family and social history reviewed and updated as indicated. Interim medical history since our last visit reviewed. Allergies and medications reviewed and updated.    Current Outpatient Medications:    ferrous sulfate 325 (65 FE) MG tablet, Take 325 mg by mouth daily with breakfast., Disp: , Rfl:    ibuprofen (ADVIL) 800 MG tablet, Take by mouth., Disp: , Rfl:    Vitamin D, Ergocalciferol, (DRISDOL) 1.25 MG (50000 UNIT) CAPS capsule, Take 1 capsule (50,000 Units total) by mouth once a week., Disp: 16 capsule, Rfl: 3   Review of Systems  Per HPI unless specifically indicated above     Objective:    BP 112/76   Pulse 67   Temp 97.8 F (36.6 C)   Ht 5' 1.5" (1.562 m)   Wt 144 lb 12 oz (65.7 kg)   SpO2 99%   BMI 26.91 kg/m   Wt Readings from Last 3 Encounters:  05/24/22 144 lb 12 oz (65.7 kg)  12/20/21 154 lb 1.6 oz (69.9 kg)  06/10/21 150 lb 8 oz (68.3 kg)    Physical Exam Vitals reviewed.  Constitutional:      General: She is not in acute distress.    Appearance: She is well-developed. She is not toxic-appearing.  HENT:     Head: Normocephalic and atraumatic.  Cardiovascular:     Rate and Rhythm:  Normal rate and regular rhythm.  Pulmonary:     Effort: Pulmonary effort is normal.     Breath sounds: Normal breath sounds.  Abdominal:     General: Bowel sounds are normal.     Palpations: Abdomen is soft. There is no mass.     Tenderness: There is no abdominal tenderness. There is no guarding or rebound.  Musculoskeletal:     Cervical back: Neck supple.  Lymphadenopathy:     Cervical: No cervical adenopathy.  Skin:    General: Skin is warm and dry.  Neurological:     Mental Status: She is alert and oriented to person, place, and time.  Psychiatric:        Behavior: Behavior normal.           Assessment & Plan:    Encounter Diagnoses  Name Primary?   Encounter for annual health examination Yes   Abdominal pain, unspecified abdominal location    History of breast cancer    Not proficient in English language     -No additional labs at this time -pt  was given sample of Protonix to take bid -pt is counseled to Update enrollment which expired earlier this month -pt is to follow up  45monthto recheck the abdominal pain.   She is to RTO sooner for worsening or new symptoms

## 2022-06-17 ENCOUNTER — Inpatient Hospital Stay: Payer: Self-pay | Attending: Physician Assistant

## 2022-06-17 DIAGNOSIS — R7401 Elevation of levels of liver transaminase levels: Secondary | ICD-10-CM

## 2022-06-17 DIAGNOSIS — Z9012 Acquired absence of left breast and nipple: Secondary | ICD-10-CM | POA: Insufficient documentation

## 2022-06-17 DIAGNOSIS — E559 Vitamin D deficiency, unspecified: Secondary | ICD-10-CM | POA: Insufficient documentation

## 2022-06-17 DIAGNOSIS — D509 Iron deficiency anemia, unspecified: Secondary | ICD-10-CM | POA: Insufficient documentation

## 2022-06-17 DIAGNOSIS — Z171 Estrogen receptor negative status [ER-]: Secondary | ICD-10-CM

## 2022-06-17 DIAGNOSIS — Z853 Personal history of malignant neoplasm of breast: Secondary | ICD-10-CM | POA: Insufficient documentation

## 2022-06-17 LAB — CBC WITH DIFFERENTIAL/PLATELET
Abs Immature Granulocytes: 0.03 10*3/uL (ref 0.00–0.07)
Basophils Absolute: 0 10*3/uL (ref 0.0–0.1)
Basophils Relative: 1 %
Eosinophils Absolute: 0.1 10*3/uL (ref 0.0–0.5)
Eosinophils Relative: 1 %
HCT: 42.2 % (ref 36.0–46.0)
Hemoglobin: 14.3 g/dL (ref 12.0–15.0)
Immature Granulocytes: 1 %
Lymphocytes Relative: 28 %
Lymphs Abs: 1.8 10*3/uL (ref 0.7–4.0)
MCH: 30.1 pg (ref 26.0–34.0)
MCHC: 33.9 g/dL (ref 30.0–36.0)
MCV: 88.8 fL (ref 80.0–100.0)
Monocytes Absolute: 0.5 10*3/uL (ref 0.1–1.0)
Monocytes Relative: 8 %
Neutro Abs: 4 10*3/uL (ref 1.7–7.7)
Neutrophils Relative %: 61 %
Platelets: 290 10*3/uL (ref 150–400)
RBC: 4.75 MIL/uL (ref 3.87–5.11)
RDW: 12.6 % (ref 11.5–15.5)
WBC: 6.5 10*3/uL (ref 4.0–10.5)
nRBC: 0 % (ref 0.0–0.2)

## 2022-06-17 LAB — COMPREHENSIVE METABOLIC PANEL
ALT: 45 U/L — ABNORMAL HIGH (ref 0–44)
AST: 27 U/L (ref 15–41)
Albumin: 4.2 g/dL (ref 3.5–5.0)
Alkaline Phosphatase: 77 U/L (ref 38–126)
Anion gap: 7 (ref 5–15)
BUN: 10 mg/dL (ref 6–20)
CO2: 25 mmol/L (ref 22–32)
Calcium: 9.5 mg/dL (ref 8.9–10.3)
Chloride: 107 mmol/L (ref 98–111)
Creatinine, Ser: 0.48 mg/dL (ref 0.44–1.00)
GFR, Estimated: >60 mL/min (ref >60)
Glucose, Bld: 108 mg/dL — ABNORMAL HIGH (ref 70–99)
Potassium: 4 mmol/L (ref 3.5–5.1)
Sodium: 139 mmol/L (ref 135–145)
Total Bilirubin: 0.8 mg/dL (ref 0.3–1.2)
Total Protein: 7.5 g/dL (ref 6.5–8.1)

## 2022-06-17 LAB — FERRITIN: Ferritin: 26 ng/mL (ref 11–307)

## 2022-06-17 LAB — IRON AND TIBC
Iron: 92 ug/dL (ref 28–170)
Saturation Ratios: 21 % (ref 10.4–31.8)
TIBC: 444 ug/dL (ref 250–450)
UIBC: 352 ug/dL

## 2022-06-17 LAB — VITAMIN D 25 HYDROXY (VIT D DEFICIENCY, FRACTURES): Vit D, 25-Hydroxy: 60.89 ng/mL (ref 30–100)

## 2022-06-20 ENCOUNTER — Ambulatory Visit: Payer: Self-pay | Admitting: Physician Assistant

## 2022-06-20 ENCOUNTER — Encounter: Payer: Self-pay | Admitting: Physician Assistant

## 2022-06-20 VITALS — BP 133/78 | HR 80 | Temp 97.8°F | Wt 147.0 lb

## 2022-06-20 DIAGNOSIS — Z789 Other specified health status: Secondary | ICD-10-CM

## 2022-06-20 DIAGNOSIS — R109 Unspecified abdominal pain: Secondary | ICD-10-CM

## 2022-06-20 MED ORDER — PANTOPRAZOLE SODIUM 40 MG PO TBEC: 40.0000 mg | DELAYED_RELEASE_TABLET | Freq: Every day | ORAL | 0 refills | Status: DC

## 2022-06-20 NOTE — Progress Notes (Signed)
BP 133/78   Pulse 80   Temp 97.8 F (36.6 C)   Wt 147 lb (66.7 kg)   SpO2 99%   BMI 27.33 kg/m    Subjective:    Patient ID: Michele Salas, female    DOB: 03-29-1981, 41 y.o.   MRN: [id number]  HPI: Michele Salas is a 41 y.o. female presenting on 06/20/2022 for Abdominal Pain   HPI   Chief Complaint  Patient presents with   Abdominal Pain    Pt is 61yoF who is in today for recheck abdominal pain.   She is no longer using IBU and she has been taking protonix bid.  She says Her abdominal pain is much improved.  She is not having any abdominal pains at all now.      Relevant past medical, surgical, family and social history reviewed and updated as indicated. Interim medical history since our last visit reviewed. Allergies and medications reviewed and updated.    Current Outpatient Medications:    ferrous sulfate 325 (65 FE) MG tablet, Take 325 mg by mouth daily with breakfast., Disp: , Rfl:    pantoprazole (PROTONIX) 40 MG tablet, Take 1 tablet (40 mg total) by mouth 2 (two) times daily., Disp: 90 tablet, Rfl: 0   Vitamin D, Ergocalciferol, (DRISDOL) 1.25 MG (50000 UNIT) CAPS capsule, Take 1 capsule (50,000 Units total) by mouth once a week., Disp: 16 capsule, Rfl: 3   ibuprofen (ADVIL) 800 MG tablet, Take by mouth. (Patient not taking: Reported on 06/20/2022), Disp: , Rfl:     Review of Systems  Per HPI unless specifically indicated above     Objective:    BP 133/78   Pulse 80   Temp 97.8 F (36.6 C)   Wt 147 lb (66.7 kg)   SpO2 99%   BMI 27.33 kg/m   Wt Readings from Last 3 Encounters:  06/20/22 147 lb (66.7 kg)  05/24/22 144 lb 12 oz (65.7 kg)  12/20/21 154 lb 1.6 oz (69.9 kg)    Physical Exam Vitals reviewed.  Constitutional:      General: She is not in acute distress.    Appearance: She is well-developed. She is not toxic-appearing.  HENT:     Head: Normocephalic and atraumatic.  Cardiovascular:     Rate and Rhythm: Normal rate and  regular rhythm.  Pulmonary:     Effort: Pulmonary effort is normal.     Breath sounds: Normal breath sounds.  Abdominal:     General: Bowel sounds are normal.     Palpations: Abdomen is soft. There is no mass.     Tenderness: There is no abdominal tenderness. There is no guarding or rebound.  Musculoskeletal:     Cervical back: Neck supple.     Right lower leg: No edema.     Left lower leg: No edema.  Lymphadenopathy:     Cervical: No cervical adenopathy.  Skin:    General: Skin is warm and dry.  Neurological:     Mental Status: She is alert and oriented to person, place, and time.  Psychiatric:        Behavior: Behavior normal.           Assessment & Plan:     Encounter Diagnoses  Name Primary?   Abdominal pain, unspecified abdominal location Yes   Not proficient in English language     -labs ordered by oncology were reviewed -Pt to rEduce protonix to qd -she is to continue to avoid nsaids -pt  to follow up 2 months.  She is to contact office sooner if her pain returns or for other issues

## 2022-06-21 ENCOUNTER — Ambulatory Visit: Payer: Self-pay | Admitting: Physician Assistant

## 2022-06-23 NOTE — Progress Notes (Signed)
[place], [place] [address]   CLINIC:  Medical Oncology/Hematology  PCP:  Michele Dryer, PA-C [address] / [place] [address] [phone]   REASON FOR VISIT:    Follow-up for history of left breast triple negative breast Salas and iron deficiency anemia  BRIEF ONCOLOGIC HISTORY:  Oncology History  Salas of central portion of left female breast ([place])  09/16/2015 Mammogram   1.6 cm retroareolar left breast mass measuring 1.6 cm in largest diameter in addition to 3 abnormal appearing left inferior axillary lymph nodes.   09/23/2015 Initial Biopsy   US guided core biopsy of left breast mass and suspicious left axillary lymph nodes.   09/23/2015 Pathology Results   Invasive ductal carcinoma, high grade.  Left axilla lymph node is positive for metastatic carcinoma.  ER/PR NEGATIVE, HER2 NEGATIVE, KI-67 marker of 85%.   10/26/2015 Imaging   Bone scan- No evidence of metastatic disease to the skeleton.   10/27/2015 Imaging   CT CAP-  Retroareolar mass with 2 asymmetrically prominent left axillary lymph nodes and an upper normal size left subpectoral lymph node. No other findings of metastatic disease in the chest, abdomen, or pelvis.   11/05/2015 Echocardiogram   The estimated ejection   fraction was in the range of 60% to 65%.   11/23/2015 Miscellaneous   Genetic Counseling- genetic test result was negative for any known pathogenic mutations within any of 28 genes that would cause her to be at an increased genetic risk for breast, ovarian, or other related cancers.   12/09/2015 Surgery   L radical mastectomy, axillary LN dissection, port a cath insertion with Dr. Aviva Salas 12/09/2015   12/11/2015 Pathology Results   - INVASIVE GRADE III DUCTAL CARCINOMA, SPANNING 2.3 CM IN GREATEST DIMENSION. - ASSOCIATED HIGH GRADE DCIS - MARGINS ARE NEGATIVE. 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 - EIGHT ADDITIONAL LYMPH  NODES WITH NO TUMOR SEEN   12/11/2015 Pathology Results   HER2 NEGATIVE, ER 0%, PR 0%, Ki-67 marker 90%   12/25/2015 - 02/12/2016 Chemotherapy   AC every 2 weeks x 4 cycles   02/04/2016 Treatment Plan Change   Treatment deferred x 7 days due to neutropenia   02/25/2016 - 05/12/2016 Chemotherapy   Paclitaxel weekly x 12   06/01/2016 - 07/19/2016 Radiation Therapy   33 fractions, Dr. Lisbeth Salas in [place]   02/06/2017 Surgery   (L) breast reconstruction (lat flap with tissue expander) with Dr. Iran Salas     Salas STAGING:  Salas Staging  Salas of central portion of left female breast [facility]) Staging form: Breast, AJCC 7th Edition - Pathologic stage from 12/11/2015: Stage IIB (T2, N1a, cM0) - Signed by Michele Cancer, PA-C on 12/31/2015   INTERVAL HISTORY:  [name], a 41 y.o. female, returns for routine follow-up of her history of left breast triple negative breast Salas and iron deficiency anemia. Michele Salas was last seen on 12/20/2021 by Michele Abernethy PA-C.  At today's visit, she  reports feeling well.  She denies any recent hospitalizations, surgeries, or changes in her  baseline health status.  Most recent mammogram on 12/06/2021 showed no evidence of malignancy.  She denies any symptoms of recurrence such as new lumps or new pain.  (She does report some chronic bone and joint pain ever since she had chemotherapy.)  She denies any new chest pain, dyspnea.  She has occasional mild abdominal pain associated with constipation.  She has no new  headaches, seizures, or focal neurologic deficits.  No B symptoms such as fever, chills, night sweats, unintentional weight loss.  She is taking iron tablet once daily, and reports worsening severity of constipation that is not relieved by MiraLAX or Colace.  She continues to have heavy menstrual periods lasting 8 out of every 28 days; she reports 4 to 5 days of heavy bleeding with each cycle (feeling large/overnight pad within 2 to 3 hours).   She denies any melena or rectal bleeding.  She reports fatigue, pica, restless legs and headaches.  She continues to take vitamin D 50,000 units weekly.    She reports 75% energy and 100% appetite.  She is maintaining stable weight at this time.   REVIEW OF SYSTEMS:  Review of Systems  Constitutional:  Positive for fatigue. Negative for appetite change, chills, diaphoresis, fever and unexpected weight change.  HENT:   Negative for lump/mass and nosebleeds.   Eyes:  Negative for eye problems.  Respiratory:  Negative for cough, hemoptysis and shortness of breath.   Cardiovascular:  Negative for chest pain, leg swelling and palpitations.  Gastrointestinal:  Positive for constipation. Negative for abdominal pain, blood in stool, diarrhea, nausea and vomiting.  Endocrine: Negative for hot flashes.  Genitourinary:  Positive for menstrual problem (menorrhagia). Negative for hematuria.   Skin: Negative.   Neurological:  Positive for headaches. Negative for dizziness and light-headedness.  Hematological:  Does not bruise/bleed easily.  Psychiatric/Behavioral:  Negative for sleep disturbance.     PAST MEDICAL/SURGICAL HISTORY:  Past Medical History:  Diagnosis Date   History of breast Salas 10/2015   left   History of chemotherapy 2017   History of radiation therapy    PONV (postoperative nausea and vomiting)    nausea only   Past Surgical History:  Procedure Laterality Date   BREAST ENHANCEMENT SURGERY Right 05/19/2017   Procedure: Augmentation Right Breast with Saline Implant for symmetry;  Surgeon: Michele Limbo, MD;  Location: [place];  Service: Plastics;  Laterality: Right;   LATISSIMUS FLAP TO BREAST Left 02/06/2017   Procedure: LEFT LATISSIMUS FLAP TO BREAST;  Surgeon: Michele Limbo, MD;  Location: [facility];  Service: Plastics;  Laterality: Left;   MASTECTOMY MODIFIED RADICAL Left 12/09/2015   Procedure: LEFT MODIFIED RADICAL MASTECTOMY;  Surgeon: Michele Signs,  MD;  Location: [facility];  Service: General;  Laterality: Left;   MASTOPEXY Right 12/22/2017   Procedure: RIGHT MASTOPEXY;  Surgeon: Michele Limbo, MD;  Location: [facility];  Service: Plastics;  Laterality: Right;   PORT-A-CATH REMOVAL Right 05/19/2017   Procedure: REMOVAL PORT-A-CATH;  Surgeon: Michele Limbo, MD;  Location: [facility];  Service: Plastics;  Laterality: Right;   PORTACATH PLACEMENT Right 12/09/2015   Procedure: INSERTION PORT-A-CATH RIGHT SUBCLAVIAN;  Surgeon: Michele Signs, MD;  Location: [facility];  Service: General;  Laterality: Right;   REMOVAL OF TISSUE EXPANDER AND PLACEMENT OF IMPLANT Left 05/19/2017   Procedure: REMOVAL OF LEFT TISSUE EXPANDER AND PLACEMENT OF SALINE IMPLANT;  Surgeon: Michele Limbo, MD;  Location: [place];  Service: Plastics;  Laterality: Left;   SKIN FULL THICKNESS GRAFT Left 12/22/2017   Procedure: LEFT NIPPLE AREOLAR COMPLEX RECONSTRUCTION WITH LOCAL FLAP  AND FULL THICKNESS SKIN GRAFT FROM LEFT GROIN;  Surgeon: Michele Limbo, MD;  Location: [place];  Service: Plastics;  Laterality: Left;   TISSUE EXPANDER PLACEMENT Left 02/06/2017   Procedure: TISSUE EXPANDER PLACEMENT LEFT BREAST;  Surgeon: Michele Limbo, MD;  Location: [facility];  Service: Clinical cytogeneticist;  Laterality: Left;    SOCIAL HISTORY:  Social History   Socioeconomic History   Marital status: Married    Spouse name: Not on file   Number of children: Not on file   Years of education: Not on file   Highest education level: Not on file  Occupational History   Not on file  Tobacco Use   Smoking status: Never   Smokeless tobacco: Never  Vaping Use   Vaping Use: Never used  Substance and Sexual Activity   Alcohol use: No   Drug use: No   Sexual activity: Yes    Birth control/protection: Condom  Other Topics Concern   Not on file  Social History Narrative   ** Merged History Encounter **       Social Determinants  of Health   Financial Resource Strain: Not on file  Food Insecurity: Not on file  Transportation Needs: Not on file  Physical Activity: Not on file  Stress: Not on file  Social Connections: Not on file  Intimate Partner Violence: Not on file    FAMILY HISTORY:  Family History  Problem Relation Age of Onset   Diabetes Mother    Heart attack Paternal Uncle    Heart attack Paternal Grandfather 57    CURRENT MEDICATIONS:  Current Outpatient Medications  Medication Sig Dispense Refill   ferrous sulfate 325 (65 FE) MG tablet Take 325 mg by mouth daily with breakfast.     pantoprazole (PROTONIX) 40 MG tablet Take 1 tablet (40 mg total) by mouth daily. 90 tablet 0   Vitamin D, Ergocalciferol, (DRISDOL) 1.25 MG (50000 UNIT) CAPS capsule Take 1 capsule (50,000 Units total) by mouth once a week. 16 capsule 3   No current facility-administered medications for this visit.    ALLERGIES:  No Known Allergies  PHYSICAL EXAM:  Performance status (ECOG): 1 - Symptomatic but completely ambulatory  There were no vitals filed for this visit. Wt Readings from Last 3 Encounters:  06/20/22 147 lb (66.7 kg)  05/24/22 144 lb 12 oz (65.7 kg)  12/20/21 154 lb 1.6 oz (69.9 kg)   Physical Exam Constitutional:      Appearance: Normal appearance.  HENT:     Head: Normocephalic and atraumatic.     Mouth/Throat:     Mouth: Mucous membranes are moist.  Eyes:     Extraocular Movements: Extraocular movements intact.     Pupils: Pupils are equal, round, and reactive to light.  Cardiovascular:     Rate and Rhythm: Normal rate and regular rhythm.     Pulses: Normal pulses.     Heart sounds: Normal heart sounds.  Pulmonary:     Effort: Pulmonary effort is normal.     Breath sounds: Normal breath sounds.  Chest:     Comments: Left breast mastectomy site s/p implant is within normal limits. Right breast with palpable implant; no palpable masses or nodules within the remaining breast tissue. No  palpable lymphadenopathy. Abdominal:     General: Bowel sounds are normal.     Palpations: Abdomen is soft.     Tenderness: There is no abdominal tenderness.  Musculoskeletal:        General: No swelling.     Right lower leg: No edema.     Left lower leg: No edema.  Lymphadenopathy:     Cervical: No cervical adenopathy.     Upper Body:     Right upper body: No supraclavicular, axillary or pectoral adenopathy.  Left upper body: No supraclavicular, axillary or pectoral adenopathy.  Skin:    General: Skin is warm and dry.  Neurological:     General: No focal deficit present.     Mental Status: She is alert and oriented to person, place, and time.  Psychiatric:        Mood and Affect: Mood normal.        Behavior: Behavior normal.      LABORATORY DATA:  I have reviewed the labs as listed.     Latest Ref Rng & Units 06/17/2022   10:02 AM 12/03/2021    1:12 PM 06/04/2021    9:33 AM  CBC  WBC 4.0 - 10.5 K/uL 6.5  8.5  5.7   Hemoglobin 12.0 - 15.0 g/dL 14.3  13.6  13.5   Hematocrit 36.0 - 46.0 % 42.2  40.9  39.8   Platelets 150 - 400 K/uL 290  322  331       Latest Ref Rng & Units 06/17/2022   10:02 AM 12/03/2021    1:12 PM 06/04/2021    9:33 AM  CMP  Glucose 70 - 99 mg/dL 108  101  118   BUN 6 - 20 mg/dL _0 Creatinine 0.44 - 1.00 mg/dL 0.48  0.48  0.44   Sodium 135 - 145 mmol/L 139  137  136   Potassium 3.5 - 5.1 mmol/L 4.0  4.1  3.7   Chloride 98 - 111 mmol/L 107  106  106   CO2 22 - 32 mmol/L _1 Calcium 8.9 - 10.3 mg/dL 9.5  9.5  9.0   Total Protein 6.5 - 8.1 g/dL 7.5  7.8  7.8   Total Bilirubin 0.3 - 1.2 mg/dL 0.8  0.7  0.7   Alkaline Phos 38 - 126 U/L 77  86  92   AST 15 - 41 U/L 27  48  40   ALT 0 - 44 U/L 45  78  65     DIAGNOSTIC IMAGING:  I have independently reviewed the scans and discussed with the patient. No results found.   ASSESSMENT & PLAN: 1.   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  She had a mammogram performed 09/16/2015 that showed a retroareolar left breast mass measuring 1.6 cm with 3 abnormal left axillary lymph nodes noted. - She had a biopsy 09/23/2015 of the left breast mass as well as a left axillary lymph node. - Pathology reported invasive ductal carcinoma high-grade left axillary lymph node was positive for metastatic Salas. ER-/PR-/HER2- Ki-67 was 85%. - Bone scan on 10/26/2015 that was negative. - CT CAP was done 10/27/2015 showed the left breast mass and left axillary lymph nodes with no other evidence of metastatic disease. - Dr. Arnoldo Morale performed a left mastectomy and axillary lymph node dissection on 12/11/2015. Pathology reported invasive ductal grade 3 measuring 2.3 cm.  With 2 out of 10 lymph nodes positive for malignancy. ER-/PR-/HER2- -She was treated with 4 cycles of AC followed by 12 weeks of Taxol which was completed October 2017. -She was also treated with radiation 33 fractions which was completed in December 2017. - Left breast implant site is within normal limits.  Right breast with palpable implant, no palpable masses.  No palpable adenopathy.   - Last right breast mammogram (12/06/2021): No mammographic evidence of malignancy in the right breast. - She  was previously noted to have some transaminitis with ALT trending up towards 78, AST peaked at 48 (April 2023).  Liver US (12/31/2021) was consistent with fatty liver disease. - Most recent labs (06/17/2022): Normal CBC.  LFTs at baseline with minimally elevated ALT 45, normal AST and alk phos.  Vitamin D at goal. - PLAN: Mammogram due around 12/07/2022. - RTC in 6 months for exam, follow-up visit, and repeat labs.  2.  Iron deficiency anemia: - She has history of menorrhagia.  No bright red blood per rectum or melena. - She is taking iron tablet once daily with severe constipation that is unrelieved by MiraLAX and Colace. - She has received intermittent Feraheme infusions, last  Feraheme on 05/22/2020. - Most recent labs (06/17/2022): Hgb 13.6/MCV 90.3, ferritin 26, iron saturation 21% - Symptomatic with fatigue, pica, RLS, headaches. - PLAN: We will just continue oral iron due to severe constipation's. - Recommend IV Venofer 300 mg x 3. - Repeat labs and RTC in 6 months.  3.   Vitamin D deficiency: -She is taking vitamin D 50,000 units weekly - Most recent vitamin D (06/17/2022) at goal (60.89) - PLAN: Continue vitamin D 50,000 units weekly.  Recheck at follow-up in 6 months.   PLAN SUMMARY: >> IV Venofer 300 mg x 3 >> Mammogram around 12/07/2022 >> Labs (CBC/D, CMP, vitamin D, ferritin, iron/TIBC) the same day as mammogram in May 2024 >> Office visit 1 week after labs/mammogram   All questions were answered. The patient knows to call the clinic with any problems, questions or concerns.  Medical decision making: Moderate  Time spent on visit: I spent 20 minutes counseling the patient face to face. The total time spent in the appointment was 30 minutes and more than 50% was on counseling.  NOTE:  Certified Spanish language medical interpreter was present to assist during duration of visit.  Harriett Rush, PA-C  06/24/22 11:16 AM

## 2022-06-24 ENCOUNTER — Inpatient Hospital Stay (HOSPITAL_BASED_OUTPATIENT_CLINIC_OR_DEPARTMENT_OTHER): Payer: Self-pay | Admitting: Physician Assistant

## 2022-06-24 VITALS — BP 142/87 | HR 81 | Temp 98.3°F | Resp 16 | Wt 147.7 lb

## 2022-06-24 DIAGNOSIS — C50112 Malignant neoplasm of central portion of left female breast: Secondary | ICD-10-CM

## 2022-06-24 DIAGNOSIS — D509 Iron deficiency anemia, unspecified: Secondary | ICD-10-CM

## 2022-06-24 DIAGNOSIS — E559 Vitamin D deficiency, unspecified: Secondary | ICD-10-CM

## 2022-06-24 DIAGNOSIS — Z1231 Encounter for screening mammogram for malignant neoplasm of breast: Secondary | ICD-10-CM

## 2022-06-24 DIAGNOSIS — Z171 Estrogen receptor negative status [ER-]: Secondary | ICD-10-CM

## 2022-06-24 MED ORDER — VITAMIN D (ERGOCALCIFEROL) 1.25 MG (50000 UNIT) PO CAPS: 50000.0000 [IU] | ORAL_CAPSULE | ORAL | 3 refills | Status: DC

## 2022-06-24 NOTE — Patient Instructions (Signed)
[place] at [facility] Discharge Instructions  You were seen today by Tarri Abernethy PA-C for your follow-up visit.  HISTORY OF BREAST CANCER: - Your labs and exam today did not show any signs of recurrent breast cancer. - Your liver numbers look better, but it is important for you to continue to work on a low-fat diet and exercise to decrease the severity of your fatty liver disease.  IRON DEFICIENCY: - Your blood levels look normal, but your iron levels are still low. - Since your iron pill is causing you severe constipation you can STOP taking your iron pill. - Instead, we will schedule you for IV iron x3 doses.  VITAMIN D DEFICIENCY: I have sent a refill prescription to your pharmacy for your vitamin D 50,000 units each week.  OTHER TESTS: Mammogram in May 2024  LABS: Return in 6 months for repeat labs  FOLLOW-UP APPOINTMENT: Office visit in 6 months, after labs   Thank you for choosing [place] at [facility] to provide your oncology and hematology care.  To afford each patient quality time with our provider, please arrive at least 15 minutes before your scheduled appointment time.   If you have a lab appointment with the [facility] please come in thru the Main Entrance and check in at the main information desk.  You need to re-schedule your appointment should you arrive 10 or more minutes late.  We strive to give you quality time with our providers, and arriving late affects you and other patients whose appointments are after yours.  Also, if you no show three or more times for appointments you may be dismissed from the clinic at the providers discretion.     Again, thank you for choosing [facility].  Our hope is that these requests will decrease the amount of time that you wait before being seen by our physicians.       _____________________________________________________________  Should you have questions  after your visit to [facility], please contact our office at [phone] and follow the prompts.  Our office hours are 8:00 a.m. and 4:30 p.m. Monday - Friday.  Please note that voicemails left after 4:00 p.m. may not be returned until the following business day.  We are closed weekends and major holidays.  You do have access to a nurse 24-7, just call the main number to the clinic [phone] and do not press any options, hold on the line and a nurse will answer the phone.    For prescription refill requests, have your pharmacy contact our office and allow 72 hours.    Due to Covid, you will need to wear a mask upon entering the hospital. If you do not have a mask, a mask will be given to you at the Main Entrance upon arrival. For doctor visits, patients may have 1 support person age 42 or older with them. For treatment visits, patients can not have anyone with them due to social distancing guidelines and our immunocompromised population.

## 2022-06-24 NOTE — Progress Notes (Signed)
Due to language barrier, an interpreter  was present during the history-taking and subsequent discussion with this patient.

## 2022-06-27 ENCOUNTER — Other Ambulatory Visit: Payer: Self-pay

## 2022-06-27 DIAGNOSIS — C50112 Malignant neoplasm of central portion of left female breast: Secondary | ICD-10-CM

## 2022-06-27 DIAGNOSIS — D509 Iron deficiency anemia, unspecified: Secondary | ICD-10-CM

## 2022-06-27 DIAGNOSIS — E559 Vitamin D deficiency, unspecified: Secondary | ICD-10-CM

## 2022-06-28 ENCOUNTER — Inpatient Hospital Stay: Payer: Self-pay

## 2022-07-15 ENCOUNTER — Inpatient Hospital Stay: Payer: Self-pay | Attending: Physician Assistant

## 2022-07-15 VITALS — BP 110/72 | HR 64 | Temp 98.3°F | Resp 18

## 2022-07-15 DIAGNOSIS — D509 Iron deficiency anemia, unspecified: Secondary | ICD-10-CM | POA: Insufficient documentation

## 2022-07-15 MED ORDER — LORATADINE 10 MG PO TABS
10.0000 mg | ORAL_TABLET | Freq: Once | ORAL | Status: AC
  Administered 2022-07-15: 10 mg via ORAL
  Filled 2022-07-15: qty 1

## 2022-07-15 MED ORDER — SODIUM CHLORIDE 0.9 % IV SOLN
300.0000 mg | Freq: Once | INTRAVENOUS | Status: AC
  Administered 2022-07-15: 300 mg via INTRAVENOUS
  Filled 2022-07-15: qty 300

## 2022-07-15 MED ORDER — SODIUM CHLORIDE 0.9 % IV SOLN: Freq: Once | INTRAVENOUS | Status: AC

## 2022-07-15 MED ORDER — ACETAMINOPHEN 325 MG PO TABS
650.0000 mg | ORAL_TABLET | Freq: Once | ORAL | Status: AC
  Administered 2022-07-15: 650 mg via ORAL
  Filled 2022-07-15: qty 2

## 2022-07-15 NOTE — Progress Notes (Signed)
Patient presents today for Venofer infusion per providers order.  Vital signs WNL.  Interpreter Derald Macleod present with patient.  Patient has no new complaints at this time.    Peripheral IV started and blood return noted pre and post infusion.  Stable during infusion without adverse affects.  Vital signs stable.  No complaints at this time.  Discharge from clinic ambulatory in stable condition.  Alert and oriented X 3.  Follow up with [facility] as scheduled.

## 2022-07-15 NOTE — Patient Instructions (Signed)
Instrucciones al darle de alta: Discharge Instructions Gracias por elegir al [facility] de Cncer de [place] para brindarle atencin mdica de oncologa y Music therapist.  Si usted tiene cita de laboratorio con [employer] de [place], por favor entre por la puerta principal y regstrese en el escritorio central de informacin.   Use ropa cmoda y [place] para tener fcil acceso a las vas del Portacath (acceso venoso de Engineer, site duracin) o la lnea PICC (catter central colocado por va perifrica).   Nos esforzamos por ofrecerle tiempo de calidad con su proveedor. Es posible que tenga que volver a programar su cita si llega tarde (15 minutos o ms).  El llegar tarde le afecta a usted y a otros pacientes cuyas citas son posteriores a Merchandiser, retail.  Adems, si usted falta a tres o ms citas sin avisar a la oficina, puede ser retirado(a) de la clnica a discrecin del proveedor.      Para las solicitudes de renovacin de recetas, pida a su farmacia que se ponga en contacto con nuestra oficina y deje que transcurran 88 horas para que se complete el proceso de las renovaciones.    Hoy usted recibi los siguientes agentes de quimioterapia e/o inmunoterapia Venofer      Para ayudar a prevenir las nuseas y los vmitos despus de su tratamiento, le recomendamos que tome su medicamento para las nuseas segn las indicaciones.  LOS SNTOMAS QUE DEBEN COMUNICARSE INMEDIATAMENTE SE INDICAN A CONTINUACIN: *FIEBRE SUPERIOR A 100.4 F (38 C) O MS *ESCALOFROS O SUDORACIN *NUSEAS Y VMITOS QUE NO SE CONTROLAN CON EL MEDICAMENTO PARA LAS NUSEAS *DIFICULTAD INUSUAL PARA RESPIRAR  *MORETONES O HEMORRAGIAS NO HABITUALES *PROBLEMAS URINARIOS (dolor o ardor al Garment/textile technologist o frecuencia para Garment/textile technologist) *PROBLEMAS INTESTINALES (diarrea inusual, estreimiento, dolor cerca del ano) SENSIBILIDAD EN LA BOCA Y EN LA GARGANTA CON O SIN LA PRESENCIA DE LCERAS (dolor de garganta, llagas en la boca o dolor de muelas/dientes) ERUPCIN,  HINCHAZN O DOLORES INUSUALES FLUJO VAGINAL INUSUAL O PICAZN/RASQUIA    Los puntos marcados con un asterisco ( *) indican una posible emergencia y debe hacer un seguimiento tan pronto como le sea posible o vaya al Departamento de Emergencias si se le presenta algn problema.  Por favor, muestre la [place] DE ADVERTENCIA DE Windy Canny DE ADVERTENCIA DE Benay Spice al registrarse en [address] de Emergencias y a la enfermera de triaje.  Si tiene preguntas despus de su visita o necesita cancelar o volver a programar su cita, por favor pngase en contacto con [place] [phone]  y [place]. Las horas de oficina son de 8:00 a.m. a 4:30 p.m. de lunes a viernes. Por favor, tenga en cuenta que los mensajes de voz que se dejan despus de las 4:00 p.m. posiblemente no se devolvern hasta el siguiente da de [place].  Cerramos los fines de semana y [employer]. En todo momento tiene acceso a una enfermera para preguntas urgentes. Por favor, llame al nmero principal de la clnica [phone] y [place] instrucciones.   Para cualquier pregunta que no sea de carcter urgente, tambin puede ponerse en contacto con su proveedor [employer]. Ahora ofrecemos visitas electrnicas para cualquier persona mayor de 18 aos que solicite atencin mdica en lnea para los sntomas que no sean urgentes. Para ms detalles vaya a mychart.[email or web address].   Tambin puede bajar la aplicacin de MyChart! Vaya a la tienda de aplicaciones, busque "MyChart", abra la aplicacin, seleccione [place], e ingrese con  su nombre de usuario y la contrasea de Pharmacist, community.  Las mscaras son opcionales en los centros de Hotel manager. Si desea que su equipo de cuidados mdicos use una [employer] atienden, por favor hgaselo saber al personal. Bethann Berkshire una persona de apoyo que tenga por lo menos 16 aos para que le acompae a sus citas.

## 2022-07-22 ENCOUNTER — Inpatient Hospital Stay: Payer: Self-pay

## 2022-07-29 ENCOUNTER — Inpatient Hospital Stay: Payer: Self-pay

## 2022-07-29 VITALS — BP 121/63 | HR 64 | Temp 97.8°F | Resp 18

## 2022-07-29 DIAGNOSIS — D509 Iron deficiency anemia, unspecified: Secondary | ICD-10-CM

## 2022-07-29 MED ORDER — ACETAMINOPHEN 325 MG PO TABS
650.0000 mg | ORAL_TABLET | Freq: Once | ORAL | Status: AC
  Administered 2022-07-29: 650 mg via ORAL
  Filled 2022-07-29: qty 2

## 2022-07-29 MED ORDER — SODIUM CHLORIDE 0.9 % IV SOLN: Freq: Once | INTRAVENOUS | Status: AC

## 2022-07-29 MED ORDER — SODIUM CHLORIDE 0.9 % IV SOLN
300.0000 mg | Freq: Once | INTRAVENOUS | Status: AC
  Administered 2022-07-29: 300 mg via INTRAVENOUS
  Filled 2022-07-29: qty 300

## 2022-07-29 MED ORDER — LORATADINE 10 MG PO TABS
10.0000 mg | ORAL_TABLET | Freq: Once | ORAL | Status: AC
  Administered 2022-07-29: 10 mg via ORAL
  Filled 2022-07-29: qty 1

## 2022-07-29 NOTE — Progress Notes (Signed)
Pt presents today for Venofer IV iron infusion per provider's order. Vital signs stable and pt voiced no new complaints at this time.  Peripheral IV started with good blood return pre and post infusion.  Venofer 300 mg given today per MD orders. Tolerated infusion without adverse affects. Vital signs stable. No complaints at this time. Discharged from clinic ambulatory in stable condition. Alert and oriented x 3. F/U with [facility] as scheduled.

## 2022-07-29 NOTE — Patient Instructions (Signed)
Instrucciones al darle de alta: Discharge Instructions Gracias por elegir al [facility] de Cncer de [place] para brindarle atencin mdica de oncologa y Music therapist.  Si usted tiene cita de laboratorio con [employer] de [place], por favor entre por la puerta principal y regstrese en el escritorio central de informacin.   Use ropa cmoda y [place] para tener fcil acceso a las vas del Portacath (acceso venoso de Engineer, site duracin) o la lnea PICC (catter central colocado por va perifrica).   Nos esforzamos por ofrecerle tiempo de calidad con su proveedor. Es posible que tenga que volver a programar su cita si llega tarde (15 minutos o ms).  El llegar tarde le afecta a usted y a otros pacientes cuyas citas son posteriores a Merchandiser, retail.  Adems, si usted falta a tres o ms citas sin avisar a la oficina, puede ser retirado(a) de la clnica a discrecin del proveedor.      Para las solicitudes de renovacin de recetas, pida a su farmacia que se ponga en contacto con nuestra oficina y deje que transcurran 59 horas para que se complete el proceso de las renovaciones.    Hoy usted recibi Venofer IV iron infusion.  LOS SNTOMAS QUE DEBEN COMUNICARSE INMEDIATAMENTE SE INDICAN A CONTINUACIN: *FIEBRE SUPERIOR A 100.4 F (38 C) O MS *ESCALOFROS O SUDORACIN *NUSEAS Y VMITOS QUE NO SE CONTROLAN CON EL MEDICAMENTO PARA LAS NUSEAS *DIFICULTAD INUSUAL PARA RESPIRAR  *MORETONES O HEMORRAGIAS NO HABITUALES *PROBLEMAS URINARIOS (dolor o ardor al Garment/textile technologist o frecuencia para Garment/textile technologist) *PROBLEMAS INTESTINALES (diarrea inusual, estreimiento, dolor cerca del ano) SENSIBILIDAD EN LA BOCA Y EN LA GARGANTA CON O SIN LA PRESENCIA DE LCERAS (dolor de garganta, llagas en la boca o dolor de muelas/dientes) ERUPCIN, HINCHAZN O DOLORES INUSUALES FLUJO VAGINAL INUSUAL O PICAZN/RASQUIA    Los puntos marcados con un asterisco ( *) indican una posible emergencia y debe hacer un seguimiento tan pronto como le sea  posible o vaya al Departamento de Emergencias si se le presenta algn problema.  Por favor, muestre la [place] DE ADVERTENCIA DE Windy Canny DE ADVERTENCIA DE Benay Spice al registrarse en [address] de Emergencias y a la enfermera de triaje.  Si tiene preguntas despus de su visita o necesita cancelar o volver a programar su cita, por favor pngase en contacto con [place] [phone]  y [place]. Las horas de oficina son de 8:00 a.m. a 4:30 p.m. de lunes a viernes. Por favor, tenga en cuenta que los mensajes de voz que se dejan despus de las 4:00 p.m. posiblemente no se devolvern hasta el siguiente da de [place].  Cerramos los fines de semana y [employer]. En todo momento tiene acceso a una enfermera para preguntas urgentes. Por favor, llame al nmero principal de la clnica [phone] y [place] instrucciones.   Para cualquier pregunta que no sea de carcter urgente, tambin puede ponerse en contacto con su proveedor [employer]. Ahora ofrecemos visitas electrnicas para cualquier persona mayor de 18 aos que solicite atencin mdica en lnea para los sntomas que no sean urgentes. Para ms detalles vaya a mychart.[email or web address].   Tambin puede bajar la aplicacin de MyChart! Vaya a la tienda de aplicaciones, busque "MyChart", abra la aplicacin, seleccione [place], e ingrese con su nombre de usuario y la contrasea de Pharmacist, community.  Las mscaras son opcionales en los centros de Hotel manager. Si desea que su equipo de cuidados mdicos use una [employer], [place]  favor hgaselo saber al personal. Bethann Berkshire una persona de apoyo que tenga por lo menos 16 aos para que le acompae a sus citas.

## 2022-08-05 ENCOUNTER — Inpatient Hospital Stay: Payer: Self-pay

## 2022-08-09 ENCOUNTER — Inpatient Hospital Stay: Payer: Self-pay | Attending: Physician Assistant

## 2022-08-09 VITALS — BP 119/72 | HR 84 | Temp 97.8°F | Resp 18 | Wt 148.6 lb

## 2022-08-09 DIAGNOSIS — D509 Iron deficiency anemia, unspecified: Secondary | ICD-10-CM | POA: Insufficient documentation

## 2022-08-09 MED ORDER — SODIUM CHLORIDE 0.9 % IV SOLN: Freq: Once | INTRAVENOUS | Status: AC

## 2022-08-09 MED ORDER — SODIUM CHLORIDE 0.9 % IV SOLN
300.0000 mg | Freq: Once | INTRAVENOUS | Status: AC
  Administered 2022-08-09: 300 mg via INTRAVENOUS
  Filled 2022-08-09: qty 300

## 2022-08-09 MED ORDER — ACETAMINOPHEN 325 MG PO TABS
650.0000 mg | ORAL_TABLET | Freq: Once | ORAL | Status: AC
  Administered 2022-08-09: 650 mg via ORAL
  Filled 2022-08-09: qty 2

## 2022-08-09 MED ORDER — LORATADINE 10 MG PO TABS
10.0000 mg | ORAL_TABLET | Freq: Once | ORAL | Status: AC
  Administered 2022-08-09: 10 mg via ORAL
  Filled 2022-08-09: qty 1

## 2022-08-09 NOTE — Patient Instructions (Signed)
Instrucciones al darle de alta: Discharge Instructions Gracias por elegir al [facility] de Cncer de [place] para brindarle atencin mdica de oncologa y Music therapist.  Si usted tiene cita de laboratorio con [employer] de [place], por favor entre por la puerta principal y regstrese en el escritorio central de informacin.   Use ropa cmoda y [place] para tener fcil acceso a las vas del Portacath (acceso venoso de Engineer, site duracin) o la lnea PICC (catter central colocado por va perifrica).   Nos esforzamos por ofrecerle tiempo de calidad con su proveedor. Es posible que tenga que volver a programar su cita si llega tarde (15 minutos o ms).  El llegar tarde le afecta a usted y a otros pacientes cuyas citas son posteriores a Merchandiser, retail.  Adems, si usted falta a tres o ms citas sin avisar a la oficina, puede ser retirado(a) de la clnica a discrecin del proveedor.      Para las solicitudes de renovacin de recetas, pida a su farmacia que se ponga en contacto con nuestra oficina y deje que transcurran 46 horas para que se complete el proceso de las renovaciones.    Hoy usted recibi los siguientes agentes de quimioterapia e/o inmunoterapia, an iron infusion   Para ayudar a prevenir las nuseas y los vmitos despus de su tratamiento, le recomendamos que tome su medicamento para las nuseas segn las indicaciones.  LOS SNTOMAS QUE DEBEN COMUNICARSE INMEDIATAMENTE SE INDICAN A CONTINUACIN: *FIEBRE SUPERIOR A 100.4 F (38 C) O MS *ESCALOFROS O SUDORACIN *NUSEAS Y VMITOS QUE NO SE CONTROLAN CON EL MEDICAMENTO PARA LAS NUSEAS *DIFICULTAD INUSUAL PARA RESPIRAR  *MORETONES O HEMORRAGIAS NO HABITUALES *PROBLEMAS URINARIOS (dolor o ardor al Garment/textile technologist o frecuencia para Garment/textile technologist) *PROBLEMAS INTESTINALES (diarrea inusual, estreimiento, dolor cerca del ano) SENSIBILIDAD EN LA BOCA Y EN LA GARGANTA CON O SIN LA PRESENCIA DE LCERAS (dolor de garganta, llagas en la boca o dolor de  muelas/dientes) ERUPCIN, HINCHAZN O DOLORES INUSUALES FLUJO VAGINAL INUSUAL O PICAZN/RASQUIA    Los puntos marcados con un asterisco ( *) indican una posible emergencia y debe hacer un seguimiento tan pronto como le sea posible o vaya al Departamento de Emergencias si se le presenta algn problema.  Por favor, muestre la [place] DE ADVERTENCIA DE Windy Canny DE ADVERTENCIA DE Benay Spice al registrarse en [address] de Emergencias y a la enfermera de triaje.  Si tiene preguntas despus de su visita o necesita cancelar o volver a programar su cita, por favor pngase en contacto con [place] [phone]  y [place]. Las horas de oficina son de 8:00 a.m. a 4:30 p.m. de lunes a viernes. Por favor, tenga en cuenta que los mensajes de voz que se dejan despus de las 4:00 p.m. posiblemente no se devolvern hasta el siguiente da de [place].  Cerramos los fines de semana y [employer]. En todo momento tiene acceso a una enfermera para preguntas urgentes. Por favor, llame al nmero principal de la clnica [phone] y [place] instrucciones.   Para cualquier pregunta que no sea de carcter urgente, tambin puede ponerse en contacto con su proveedor [employer]. Ahora ofrecemos visitas electrnicas para cualquier persona mayor de 18 aos que solicite atencin mdica en lnea para los sntomas que no sean urgentes. Para ms detalles vaya a mychart.[email or web address].   Tambin puede bajar la aplicacin de MyChart! Vaya a la tienda de aplicaciones, busque "MyChart", abra la aplicacin, seleccione [place], e ingrese con su  nombre de usuario y la contrasea de Pharmacist, community.

## 2022-08-09 NOTE — Progress Notes (Signed)
Iron infusion given per orders. Patient tolerated it well without problems. Vitals stable and discharged home from clinic ambulatory. Follow up as scheduled.  

## 2022-08-22 ENCOUNTER — Ambulatory Visit: Payer: Self-pay | Admitting: Physician Assistant

## 2022-08-30 ENCOUNTER — Encounter: Payer: Self-pay | Admitting: Physician Assistant

## 2022-08-30 ENCOUNTER — Ambulatory Visit: Payer: Self-pay | Admitting: Physician Assistant

## 2022-08-30 VITALS — BP 120/84 | HR 72 | Temp 98.0°F | Wt 149.2 lb

## 2022-08-30 DIAGNOSIS — Z789 Other specified health status: Secondary | ICD-10-CM

## 2022-08-30 DIAGNOSIS — R300 Dysuria: Secondary | ICD-10-CM

## 2022-08-30 DIAGNOSIS — R109 Unspecified abdominal pain: Secondary | ICD-10-CM

## 2022-08-30 LAB — POCT URINALYSIS DIPSTICK
Bilirubin, UA: NEGATIVE
Glucose, UA: NEGATIVE
Ketones, UA: NEGATIVE
Nitrite, UA: NEGATIVE
Protein, UA: POSITIVE — AB
Spec Grav, UA: 1.025 (ref 1.010–1.025)
Urobilinogen, UA: 1 E.U./dL
pH, UA: 5.5 (ref 5.0–8.0)

## 2022-08-30 MED ORDER — CEPHALEXIN 500 MG PO CAPS: 500.0000 mg | ORAL_CAPSULE | Freq: Four times a day (QID) | ORAL | 0 refills | Status: AC

## 2022-08-30 NOTE — Progress Notes (Signed)
BP 120/84   Pulse 72   Temp 98 F (36.7 C)   Wt 149 lb 4 oz (67.7 kg)   SpO2 99%   BMI 27.74 kg/m    Subjective:    Patient ID: Michele Salas, female    DOB: 1981/04/24, 42 y.o.   MRN: [id number]  HPI: Michele Salas is a 41 y.o. female presenting on 08/30/2022 for Abdominal Pain   HPI   Chief Complaint  Patient presents with   Abdominal Pain     Pt is using her Protonix qd.  She is Not using IBU.  She is having No abdominal pain.  She has Burning with urination that started on Sunday.  She has had No emesis and No fever.     Relevant past medical, surgical, family and social history reviewed and updated as indicated. Interim medical history since our last visit reviewed. Allergies and medications reviewed and updated.   Current Outpatient Medications:    pantoprazole (PROTONIX) 40 MG tablet, Take 1 tablet (40 mg total) by mouth daily., Disp: 90 tablet, Rfl: 0   Vitamin D, Ergocalciferol, (DRISDOL) 1.25 MG (50000 UNIT) CAPS capsule, Take 1 capsule (50,000 Units total) by mouth once a week., Disp: 16 capsule, Rfl: 3   ferrous sulfate 325 (65 FE) MG tablet, Take 325 mg by mouth daily with breakfast. (Patient not taking: Reported on 08/30/2022), Disp: , Rfl:      Review of Systems  Per HPI unless specifically indicated above     Objective:    BP 120/84   Pulse 72   Temp 98 F (36.7 C)   Wt 149 lb 4 oz (67.7 kg)   SpO2 99%   BMI 27.74 kg/m   Wt Readings from Last 3 Encounters:  08/30/22 149 lb 4 oz (67.7 kg)  08/09/22 148 lb 9.6 oz (67.4 kg)  06/24/22 147 lb 11.3 oz (67 kg)    Physical Exam Vitals reviewed.  Constitutional:      General: She is not in acute distress.    Appearance: She is well-developed. She is not toxic-appearing.  HENT:     Head: Normocephalic and atraumatic.  Cardiovascular:     Rate and Rhythm: Normal rate and regular rhythm.  Pulmonary:     Effort: Pulmonary effort is normal.     Breath sounds: Normal breath sounds.   Abdominal:     General: Bowel sounds are normal.     Palpations: Abdomen is soft. There is no mass.     Tenderness: There is no abdominal tenderness. There is no right CVA tenderness, left CVA tenderness, guarding or rebound.  Musculoskeletal:     Cervical back: Neck supple.     Right lower leg: No edema.     Left lower leg: No edema.  Lymphadenopathy:     Cervical: No cervical adenopathy.  Skin:    General: Skin is warm and dry.  Neurological:     Mental Status: She is alert and oriented to person, place, and time.  Psychiatric:        Behavior: Behavior normal.            Assessment & Plan:    Encounter Diagnoses  Name Primary?   Dysuria Yes   Abdominal pain, unspecified abdominal location    Not proficient in English language       -Will discontinue the  protonix.  She is encouraged to continue to avoid NSAIDs -Rx keflex for urine -pt to follow up 6 months.  She is  to contact office sooner if abdominal pain returns or if urine fails to resolve or for other issues

## 2022-12-09 ENCOUNTER — Inpatient Hospital Stay: Payer: Self-pay | Attending: Physician Assistant

## 2022-12-09 ENCOUNTER — Ambulatory Visit (HOSPITAL_COMMUNITY)
Admission: RE | Admit: 2022-12-09 | Discharge: 2022-12-09 | Disposition: A | Discharge status: Home / Self Care | Payer: Self-pay | Source: Ambulatory Visit | Attending: Physician Assistant | Admitting: Physician Assistant

## 2022-12-09 DIAGNOSIS — D509 Iron deficiency anemia, unspecified: Secondary | ICD-10-CM | POA: Insufficient documentation

## 2022-12-09 DIAGNOSIS — Z1231 Encounter for screening mammogram for malignant neoplasm of breast: Secondary | ICD-10-CM | POA: Insufficient documentation

## 2022-12-09 DIAGNOSIS — E559 Vitamin D deficiency, unspecified: Secondary | ICD-10-CM | POA: Insufficient documentation

## 2022-12-09 DIAGNOSIS — C50112 Malignant neoplasm of central portion of left female breast: Secondary | ICD-10-CM | POA: Insufficient documentation

## 2022-12-09 DIAGNOSIS — Z923 Personal history of irradiation: Secondary | ICD-10-CM | POA: Insufficient documentation

## 2022-12-09 DIAGNOSIS — Z853 Personal history of malignant neoplasm of breast: Secondary | ICD-10-CM | POA: Insufficient documentation

## 2022-12-09 DIAGNOSIS — Z171 Estrogen receptor negative status [ER-]: Secondary | ICD-10-CM | POA: Insufficient documentation

## 2022-12-09 DIAGNOSIS — Z9221 Personal history of antineoplastic chemotherapy: Secondary | ICD-10-CM | POA: Insufficient documentation

## 2022-12-09 DIAGNOSIS — Z9882 Breast implant status: Secondary | ICD-10-CM | POA: Insufficient documentation

## 2022-12-09 LAB — COMPREHENSIVE METABOLIC PANEL
ALT: 29 U/L (ref 0–44)
AST: 21 U/L (ref 15–41)
Albumin: 4.2 g/dL (ref 3.5–5.0)
Alkaline Phosphatase: 80 U/L (ref 38–126)
Anion gap: 8 (ref 5–15)
BUN: 11 mg/dL (ref 6–20)
CO2: 25 mmol/L (ref 22–32)
Calcium: 8.9 mg/dL (ref 8.9–10.3)
Chloride: 104 mmol/L (ref 98–111)
Creatinine, Ser: 0.5 mg/dL (ref 0.44–1.00)
GFR, Estimated: >60 mL/min (ref >60)
Glucose, Bld: 115 mg/dL — ABNORMAL HIGH (ref 70–99)
Potassium: 3.8 mmol/L (ref 3.5–5.1)
Sodium: 137 mmol/L (ref 135–145)
Total Bilirubin: 0.9 mg/dL (ref 0.3–1.2)
Total Protein: 7.5 g/dL (ref 6.5–8.1)

## 2022-12-09 LAB — CBC WITH DIFFERENTIAL/PLATELET
Abs Immature Granulocytes: 0.01 10*3/uL (ref 0.00–0.07)
Basophils Absolute: 0.1 10*3/uL (ref 0.0–0.1)
Basophils Relative: 1 %
Eosinophils Absolute: 0.3 10*3/uL (ref 0.0–0.5)
Eosinophils Relative: 5 %
HCT: 39.2 % (ref 36.0–46.0)
Hemoglobin: 13.2 g/dL (ref 12.0–15.0)
Immature Granulocytes: 0 %
Lymphocytes Relative: 28 %
Lymphs Abs: 1.6 10*3/uL (ref 0.7–4.0)
MCH: 29.6 pg (ref 26.0–34.0)
MCHC: 33.7 g/dL (ref 30.0–36.0)
MCV: 87.9 fL (ref 80.0–100.0)
Monocytes Absolute: 0.4 10*3/uL (ref 0.1–1.0)
Monocytes Relative: 8 %
Neutro Abs: 3.3 10*3/uL (ref 1.7–7.7)
Neutrophils Relative %: 58 %
Platelets: 320 10*3/uL (ref 150–400)
RBC: 4.46 MIL/uL (ref 3.87–5.11)
RDW: 12.7 % (ref 11.5–15.5)
WBC: 5.7 10*3/uL (ref 4.0–10.5)
nRBC: 0 % (ref 0.0–0.2)

## 2022-12-09 LAB — IRON AND TIBC
Iron: 78 ug/dL (ref 28–170)
Saturation Ratios: 18 % (ref 10.4–31.8)
TIBC: 445 ug/dL (ref 250–450)
UIBC: 367 ug/dL

## 2022-12-09 LAB — FERRITIN: Ferritin: 18 ng/mL (ref 11–307)

## 2022-12-09 LAB — VITAMIN D 25 HYDROXY (VIT D DEFICIENCY, FRACTURES): Vit D, 25-Hydroxy: 28.69 ng/mL — ABNORMAL LOW (ref 30–100)

## 2022-12-15 NOTE — Progress Notes (Signed)
[facility] [address], [place] [address]   CLINIC:  Medical Oncology/Hematology  PCP:  Jacquelin Hawking, PA-C [address] / [place] [address] [phone]   REASON FOR VISIT:    Follow-up for history of left breast triple negative breast cancer and iron deficiency anemia  BRIEF ONCOLOGIC HISTORY:   Oncology History  Cancer of central portion of left female breast (HCC)  09/16/2015 Mammogram   1.6 cm retroareolar left breast mass measuring 1.6 cm in largest diameter in addition to 3 abnormal appearing left inferior axillary lymph nodes.   09/23/2015 Initial Biopsy   US guided core biopsy of left breast mass and suspicious left axillary lymph nodes.   09/23/2015 Pathology Results   Invasive ductal carcinoma, high grade.  Left axilla lymph node is positive for metastatic carcinoma.  ER/PR NEGATIVE, HER2 NEGATIVE, KI-67 marker of 85%.   10/26/2015 Imaging   Bone scan- No evidence of metastatic disease to the skeleton.   10/27/2015 Imaging   CT CAP-  Retroareolar mass with 2 asymmetrically prominent left axillary lymph nodes and an upper normal size left subpectoral lymph node. No other findings of metastatic disease in the chest, abdomen, or pelvis.   11/05/2015 Echocardiogram   The estimated ejection   fraction was in the range of 60% to 65%.   11/23/2015 Miscellaneous   Genetic Counseling- genetic test result was negative for any known pathogenic mutations within any of 28 genes that would cause her to be at an increased genetic risk for breast, ovarian, or other related cancers.   12/09/2015 Surgery   L radical mastectomy, axillary LN dissection, port a cath insertion with Dr. Franky Macho 12/09/2015   12/11/2015 Pathology Results   - INVASIVE GRADE III DUCTAL CARCINOMA, SPANNING 2.3 CM IN GREATEST DIMENSION. - ASSOCIATED HIGH GRADE DCIS - MARGINS ARE NEGATIVE. 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 - EIGHT ADDITIONAL LYMPH  NODES WITH NO TUMOR SEEN   12/11/2015 Pathology Results   HER2 NEGATIVE, ER 0%, PR 0%, Ki-67 marker 90%   12/25/2015 - 02/12/2016 Chemotherapy   AC every 2 weeks x 4 cycles   02/04/2016 Treatment Plan Change   Treatment deferred x 7 days due to neutropenia   02/25/2016 - 05/12/2016 Chemotherapy   Paclitaxel weekly x 12   06/01/2016 - 07/19/2016 Radiation Therapy   33 fractions, Dr. Mitzi Hansen in [place]   02/06/2017 Surgery   (L) breast reconstruction (lat flap with tissue expander) with Dr. Leta Baptist     CANCER STAGING: Cancer Staging  Cancer of central portion of left female breast [facility]) Staging form: Breast, AJCC 7th Edition - Pathologic stage from 12/11/2015: Stage IIB (T2, N1a, cM0) - Signed by Ellouise Newer, PA-C on 12/31/2015   INTERVAL HISTORY:   [name], a 42 y.o. female, returns for routine follow-up of her history of left breast triple negative breast cancer and iron deficiency anemia. [name] was last seen on 06/24/2022 by Rojelio Brenner PA-C.  HISTORY OF BREAST CANCER: She denies any symptoms of recurrence such as new lumps or new pain.  (She does report some chronic bone and joint pain ever since she had chemotherapy.)   She denies any new chest pain, dyspnea, or abdominal pain.  She has no new headaches, seizures, or focal neurologic deficits.  No B symptoms such as fever, chills, or unintentional weight loss.  She has been having hot flashes and night sweats about once a week ever since chemo.  IRON DEFICIENCY ANEMIA: She felt significantly improved energy after IV iron given in December/January.  Constipation resolved after her iron tablet was discontinued.  She reports that her periods have been lighter the past few months, and that she is only bleeding 4 to 5 days, instead of 7 to 8 days.  She denies any melena or rectal bleeding.  No pica, restless legs, or abnormal headaches.  She continues to take vitamin D 50,000 units once a week.   She reports 100%  energy and 100% appetite.  She is maintaining stable weight at this time.  ASSESSMENT & PLAN:  1.   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  She had a mammogram performed 09/16/2015 that showed a retroareolar left breast mass measuring 1.6 cm with 3 abnormal left axillary lymph nodes noted. - She had a biopsy 09/23/2015 of the left breast mass as well as a left axillary lymph node. PATHOLOGY: Invasive ductal carcinoma high-grade left axillary lymph node was positive for metastatic cancer. ER-/PR-/HER2- Ki-67 was 85%. - Bone scan on 10/26/2015 that was negative. - CT CAP was done 10/27/2015 showed the left breast mass and left axillary lymph nodes with no other evidence of metastatic disease. - Dr. Lovell Sheehan performed a left mastectomy and axillary lymph node dissection on 12/11/2015.  PATHOLOGY reported invasive ductal grade 3 measuring 2.3 cm.  With 2 out of 10 lymph nodes positive for malignancy. ER-/PR-/HER2- -She was treated with 4 cycles of AC followed by 12 weeks of Taxol which was completed October 2017. -She was also treated with radiation 33 fractions which was completed in December 2017. - - - - - - - - - - - - - - - - - - - - - - - - - - - Left breast implant site is within normal limits.  Right breast with palpable implant, no palpable masses.  No palpable adenopathy. - Most recent unilateral right breast mammogram (12/09/2022): No mammographic evidence of malignancy in the right breast. - Most recent labs (12/09/2022): Normal CBC.  LFTs normal.  Previously noted to have transaminitis (peak ALT 78, peak AST 48).  Liver US from May 2023 consistent with fatty liver disease. - PLAN: RTC in 6 months for labs and office visit/exam. - Annual mammogram due May 2025.   2.  Iron deficiency anemia: - She has history of menorrhagia.  No bright red blood per rectum or melena. - Iron tablet discontinued due to severe constipation  - She has  received intermittent IV iron, most recently with Venofer 300 mg x 3 in December 2023/January 2024 - Most recent labs (12/09/2022): Hgb 13.2/MCV 87.9, ferritin 18, iron saturation 18 % - Currently asymptomatic - PLAN: Patient would like to hold off on IV iron; although she has evidence of iron deficiency (no anemia), she is asymptomatic.  She is aware that if she develops symptoms prior to next visit, she can call to request IV iron infusions and lab recheck. - Repeat labs and RTC in 6 months.   3.   Vitamin D deficiency: - She is taking vitamin D 50,000 units weekly - Most recent vitamin D (12/09/2022) is low (28.69) - PLAN: Continue vitamin D 50,000 units weekly.   Recheck at follow-up in 6 months.  PLAN SUMMARY: >> Labs in 6 months = CBC/D, CMP, ferritin, iron/TIBC, vitamin D >> OFFICE visit in 6 months (1 week after labs)    REVIEW OF SYSTEMS:   Review  of Systems  Constitutional:  Negative for appetite change, chills, diaphoresis, fatigue, fever and unexpected weight change.  HENT:   Negative for lump/mass and nosebleeds.   Eyes:  Negative for eye problems.  Respiratory:  Negative for cough, hemoptysis and shortness of breath.   Cardiovascular:  Negative for chest pain, leg swelling and palpitations.  Gastrointestinal:  Negative for abdominal pain, blood in stool, constipation, diarrhea, nausea and vomiting.  Genitourinary:  Negative for hematuria.   Skin: Negative.   Neurological:  Positive for headaches and numbness. Negative for dizziness and light-headedness.  Hematological:  Does not bruise/bleed easily.    PHYSICAL EXAM:   Performance status (ECOG): 0 - Asymptomatic  There were no vitals filed for this visit. Wt Readings from Last 3 Encounters:  08/30/22 149 lb 4 oz (67.7 kg)  08/09/22 148 lb 9.6 oz (67.4 kg)  06/24/22 147 lb 11.3 oz (67 kg)   Physical Exam Constitutional:      Appearance: Normal appearance.  HENT:     Head: Normocephalic and atraumatic.      Mouth/Throat:     Mouth: Mucous membranes are moist.  Eyes:     Extraocular Movements: Extraocular movements intact.     Pupils: Pupils are equal, round, and reactive to light.  Cardiovascular:     Rate and Rhythm: Normal rate and regular rhythm.     Pulses: Normal pulses.     Heart sounds: Normal heart sounds.  Pulmonary:     Effort: Pulmonary effort is normal.     Breath sounds: Normal breath sounds.  Chest:     Comments: Left breast mastectomy site s/p implant is within normal limits. Right breast with palpable implant; no palpable masses or nodules within the remaining breast tissue. No palpable lymphadenopathy. Abdominal:     General: Bowel sounds are normal.     Palpations: Abdomen is soft.     Tenderness: There is no abdominal tenderness.  Musculoskeletal:        General: No swelling.     Right lower leg: No edema.     Left lower leg: No edema.  Lymphadenopathy:     Cervical: No cervical adenopathy.     Upper Body:     Right upper body: No supraclavicular, axillary or pectoral adenopathy.     Left upper body: No supraclavicular, axillary or pectoral adenopathy.  Skin:    General: Skin is warm and dry.  Neurological:     General: No focal deficit present.     Mental Status: She is alert and oriented to person, place, and time.  Psychiatric:        Mood and Affect: Mood normal.        Behavior: Behavior normal.      PAST MEDICAL/SURGICAL HISTORY:  Past Medical History:  Diagnosis Date   History of breast cancer 10/2015   left   History of chemotherapy 2017   History of radiation therapy    PONV (postoperative nausea and vomiting)    nausea only   Past Surgical History:  Procedure Laterality Date   BREAST ENHANCEMENT SURGERY Right 05/19/2017   Procedure: Augmentation Right Breast with Saline Implant for symmetry;  Surgeon: Glenna Fellows, MD;  Location: [facility];  Service: Plastics;  Laterality: Right;   LATISSIMUS FLAP TO BREAST Left  02/06/2017   Procedure: LEFT LATISSIMUS FLAP TO BREAST;  Surgeon: Glenna Fellows, MD;  Location: [facility];  Service: Plastics;  Laterality: Left;   MASTECTOMY MODIFIED RADICAL Left 12/09/2015   Procedure:  LEFT MODIFIED RADICAL MASTECTOMY;  Surgeon: Franky Macho, MD;  Location: [facility];  Service: General;  Laterality: Left;   MASTOPEXY Right 12/22/2017   Procedure: RIGHT MASTOPEXY;  Surgeon: Glenna Fellows, MD;  Location: [facility];  Service: Plastics;  Laterality: Right;   PORT-A-CATH REMOVAL Right 05/19/2017   Procedure: REMOVAL PORT-A-CATH;  Surgeon: Glenna Fellows, MD;  Location: [facility];  Service: Plastics;  Laterality: Right;   PORTACATH PLACEMENT Right 12/09/2015   Procedure: INSERTION PORT-A-CATH RIGHT SUBCLAVIAN;  Surgeon: Franky Macho, MD;  Location: [facility];  Service: General;  Laterality: Right;   REMOVAL OF TISSUE EXPANDER AND PLACEMENT OF IMPLANT Left 05/19/2017   Procedure: REMOVAL OF LEFT TISSUE EXPANDER AND PLACEMENT OF SALINE IMPLANT;  Surgeon: Glenna Fellows, MD;  Location: [facility];  Service: Plastics;  Laterality: Left;   SKIN FULL THICKNESS GRAFT Left 12/22/2017   Procedure: LEFT NIPPLE AREOLAR COMPLEX RECONSTRUCTION WITH LOCAL FLAP  AND FULL THICKNESS SKIN GRAFT FROM LEFT GROIN;  Surgeon: Glenna Fellows, MD;  Location: [facility];  Service: Plastics;  Laterality: Left;   TISSUE EXPANDER PLACEMENT Left 02/06/2017   Procedure: TISSUE EXPANDER PLACEMENT LEFT BREAST;  Surgeon: Glenna Fellows, MD;  Location: [facility];  Service: Plastics;  Laterality: Left;    SOCIAL HISTORY:  Social History   Socioeconomic History   Marital status: Married    Spouse name: Not on file   Number of children: Not on file   Years of education: Not on file   Highest education level: Not on file  Occupational History   Not on file  Tobacco Use   Smoking status: Never   Smokeless tobacco: Never  Vaping Use   Vaping Use: Never  used  Substance and Sexual Activity   Alcohol use: No   Drug use: No   Sexual activity: Yes    Birth control/protection: Condom  Other Topics Concern   Not on file  Social History Narrative   ** Merged History Encounter **       Social Determinants of Health   Financial Resource Strain: Not on file  Food Insecurity: Not on file  Transportation Needs: Not on file  Physical Activity: Not on file  Stress: Not on file  Social Connections: Not on file  Intimate Partner Violence: Not on file    FAMILY HISTORY:  Family History  Problem Relation Age of Onset   Diabetes Mother    Heart attack Paternal Uncle    Heart attack Paternal Grandfather 53    CURRENT MEDICATIONS:  Current Outpatient Medications  Medication Sig Dispense Refill   ferrous sulfate 325 (65 FE) MG tablet Take 325 mg by mouth daily with breakfast. (Patient not taking: Reported on 08/30/2022)     Vitamin D, Ergocalciferol, (DRISDOL) 1.25 MG (50000 UNIT) CAPS capsule Take 1 capsule (50,000 Units total) by mouth once a week. 16 capsule 3   No current facility-administered medications for this visit.    ALLERGIES:  No Known Allergies  LABORATORY DATA:  I have reviewed the labs as listed.     Latest Ref Rng & Units 12/09/2022   10:20 AM 06/17/2022   10:02 AM 12/03/2021    1:12 PM  CBC  WBC 4.0 - 10.5 K/uL 5.7  6.5  8.5   Hemoglobin 12.0 - 15.0 g/dL 40.9  81.1  91.4   Hematocrit 36.0 - 46.0 % 39.2  42.2  40.9   Platelets 150 - 400 K/uL 320  290  322  Latest Ref Rng & Units 12/09/2022   10:20 AM 06/17/2022   10:02 AM 12/03/2021    1:12 PM  CMP  Glucose 70 - 99 mg/dL 161  096  045   BUN 6 - 20 mg/dL 11  10  11    Creatinine 0.44 - 1.00 mg/dL 4.09  8.11  9.14   Sodium 135 - 145 mmol/L 137  139  137   Potassium 3.5 - 5.1 mmol/L 3.8  4.0  4.1   Chloride 98 - 111 mmol/L 104  107  106   CO2 22 - 32 mmol/L 25  25  24    Calcium 8.9 - 10.3 mg/dL 8.9  9.5  9.5   Total Protein 6.5 - 8.1 g/dL 7.5  7.5  7.8    Total Bilirubin 0.3 - 1.2 mg/dL 0.9  0.8  0.7   Alkaline Phos 38 - 126 U/L 80  77  86   AST 15 - 41 U/L 21  27  48   ALT 0 - 44 U/L 29  45  78     DIAGNOSTIC IMAGING:  I have independently reviewed the scans and discussed with the patient. MM 3D SCREENING MAMMOGRAM UNILATERAL RIGHT BREAST W/IMPLANT  Result Date: 12/12/2022 CLINICAL DATA:  Screening. LEFT breast cancer with mastectomy in 2017. EXAM: DIGITAL SCREENING UNILATERAL RIGHT MAMMOGRAM WITH IMPLANTS, CAD AND TOMOSYNTHESIS TECHNIQUE: Right screening digital craniocaudal and mediolateral oblique mammograms were obtained. Right screening digital breast tomosynthesis was performed. The images were evaluated with computer-aided detection. Standard and/or implant displaced views were performed. COMPARISON:  Previous exam(s). ACR Breast Density Category c: The breasts are heterogeneously dense, which may obscure small masses. FINDINGS: The patient has a RIGHT retropectoral implant. There are no findings suspicious for malignancy. Status post LEFT mastectomy. IMPRESSION: No mammographic evidence of malignancy. A result letter of this screening mammogram will be mailed directly to the patient. RECOMMENDATION: Screening mammogram in one year. (Code:SM-B-01Y) Recommend supplemental screening with breast MRI with and without contrast/abbreviated breast MRI given history of malignancy at a young age and dense breast tissue. The American Cancer Society recommends annual MRI and mammography in patients with an estimated lifetime risk of developing breast cancer greater than 20 - 25%, or who are known or suspected to be positive for the breast cancer gene. BI-RADS CATEGORY  1: Negative. Electronically Signed   By: Meda Klinefelter M.D.   On: 12/12/2022 15:15     WRAP UP:  All questions were answered. The patient knows to call the clinic with any problems, questions or concerns.  Medical decision making: Moderate  Time spent on visit: I spent 20 minutes  counseling the patient face to face. The total time spent in the appointment was 30 minutes and more than 50% was on counseling.  NOTE: Visit performed with assistance of certified Spanish language medical interpreter.    Carnella Guadalajara, PA-C  12/16/2022 10:38 AM

## 2022-12-16 ENCOUNTER — Inpatient Hospital Stay (HOSPITAL_BASED_OUTPATIENT_CLINIC_OR_DEPARTMENT_OTHER): Payer: Self-pay | Admitting: Physician Assistant

## 2022-12-16 VITALS — BP 139/88 | HR 75 | Temp 98.5°F | Resp 18 | Wt 148.2 lb

## 2022-12-16 DIAGNOSIS — C50112 Malignant neoplasm of central portion of left female breast: Secondary | ICD-10-CM

## 2022-12-16 DIAGNOSIS — D500 Iron deficiency anemia secondary to blood loss (chronic): Secondary | ICD-10-CM

## 2022-12-16 DIAGNOSIS — E559 Vitamin D deficiency, unspecified: Secondary | ICD-10-CM

## 2022-12-16 DIAGNOSIS — Z171 Estrogen receptor negative status [ER-]: Secondary | ICD-10-CM

## 2022-12-16 MED ORDER — VITAMIN D (ERGOCALCIFEROL) 1.25 MG (50000 UNIT) PO CAPS
50000.0000 [IU] | ORAL_CAPSULE | ORAL | 3 refills | Status: DC
Start: 2022-12-16 — End: 2024-07-01

## 2022-12-19 ENCOUNTER — Other Ambulatory Visit: Payer: Self-pay

## 2022-12-19 DIAGNOSIS — E559 Vitamin D deficiency, unspecified: Secondary | ICD-10-CM

## 2022-12-19 DIAGNOSIS — D500 Iron deficiency anemia secondary to blood loss (chronic): Secondary | ICD-10-CM

## 2023-02-28 ENCOUNTER — Ambulatory Visit: Payer: Self-pay | Admitting: Physician Assistant

## 2023-03-01 ENCOUNTER — Encounter: Payer: Self-pay | Admitting: Physician Assistant

## 2023-03-01 ENCOUNTER — Ambulatory Visit: Payer: Self-pay | Admitting: Physician Assistant

## 2023-03-01 VITALS — BP 122/86 | HR 61 | Temp 98.1°F | Wt 151.0 lb

## 2023-03-01 DIAGNOSIS — K219 Gastro-esophageal reflux disease without esophagitis: Secondary | ICD-10-CM

## 2023-03-01 DIAGNOSIS — Z853 Personal history of malignant neoplasm of breast: Secondary | ICD-10-CM

## 2023-03-01 DIAGNOSIS — Z789 Other specified health status: Secondary | ICD-10-CM

## 2023-03-01 DIAGNOSIS — K4090 Unilateral inguinal hernia, without obstruction or gangrene, not specified as recurrent: Secondary | ICD-10-CM

## 2023-03-01 MED ORDER — OMEPRAZOLE 40 MG PO CPDR
40.0000 mg | DELAYED_RELEASE_CAPSULE | Freq: Every day | ORAL | 0 refills | Status: DC | PRN
Start: 2023-03-01 — End: 2023-03-01

## 2023-03-01 MED ORDER — OMEPRAZOLE 40 MG PO CPDR: 40.0000 mg | DELAYED_RELEASE_CAPSULE | Freq: Every day | ORAL | 6 refills | Status: DC | PRN

## 2023-03-01 NOTE — Progress Notes (Signed)
BP 122/86   Pulse 61   Temp 98.1 F (36.7 C)   Wt 151 lb (68.5 kg)   SpO2 99%   BMI 28.07 kg/m    Subjective:    Patient ID: Michele Salas, female    DOB: [birth year], 42 y.o.   MRN: [id number]  HPI: Michele Salas is a 42 y.o. female presenting on 03/01/2023 for No chief complaint on file.   HPI   Pt is 42yoF who is in today for routine follow up.  She continues to see oncology for follow up of breast cancer and anemia.    She is having problems again with her stomach, like the last time but a little bit less.  She stopped the protonix after January OV.  She has pain maybe 2 days/week.  She says she has seen correlation between what she eats and when she has pain.  Other than the stomach issues, she is doing well.   She says she does notice a hernia in her right groin when she sneezes.    She doesn't have any pain with it.     Relevant past medical, surgical, family and social history reviewed and updated as indicated. Interim medical history since our last visit reviewed. Allergies and medications reviewed and updated.    Current Outpatient Medications:    Vitamin D, Ergocalciferol, (DRISDOL) 1.25 MG (50000 UNIT) CAPS capsule, Take 1 capsule (50,000 Units total) by mouth once a week., Disp: 16 capsule, Rfl: 3    Review of Systems  Per HPI unless specifically indicated above     Objective:    BP 122/86   Pulse 61   Temp 98.1 F (36.7 C)   Wt 151 lb (68.5 kg)   SpO2 99%   BMI 28.07 kg/m   Wt Readings from Last 3 Encounters:  03/01/23 151 lb (68.5 kg)  12/16/22 148 lb 3.2 oz (67.2 kg)  08/30/22 149 lb 4 oz (67.7 kg)    Physical Exam Vitals reviewed. Exam conducted with a chaperone present.  Constitutional:      General: She is not in acute distress.    Appearance: She is well-developed. She is not toxic-appearing.  HENT:     Head: Normocephalic and atraumatic.  Cardiovascular:     Rate and Rhythm: Normal rate and regular rhythm.  Pulmonary:      Effort: Pulmonary effort is normal.     Breath sounds: Normal breath sounds.  Abdominal:     General: Bowel sounds are normal.     Palpations: Abdomen is soft. There is no mass or pulsatile mass.     Tenderness: There is no abdominal tenderness. There is no guarding or rebound.     Hernia: A hernia is present. Hernia is present in the right inguinal area.  Genitourinary:    Comments: Very mildly palpable hernia with valsalva.  Non-tender.  Not palpable at rest. Musculoskeletal:     Cervical back: Neck supple.     Right lower leg: No edema.     Left lower leg: No edema.  Lymphadenopathy:     Cervical: No cervical adenopathy.     Lower Body: No right inguinal adenopathy.  Skin:    General: Skin is warm and dry.  Neurological:     Mental Status: She is alert and oriented to person, place, and time.  Psychiatric:        Behavior: Behavior normal.               Assessment & Plan:  Encounter Diagnoses  Name Primary?   Gastroesophageal reflux disease, unspecified whether esophagitis present Yes   Unilateral inguinal hernia without obstruction or gangrene, recurrence not specified    History of breast cancer    Not proficient in English language      -omeprazole prn depending on meals .  She is to contact office if that doesn't take care of it -Counseled on hernias.  She is to contact office if it worsens or starts hurting -pt to continue with hematology per their recomendation -pt to follow up 6 months.  She is to contact office sooner prn

## 2023-03-01 NOTE — Patient Instructions (Signed)
Hernia, en adultos Hernia, Adult     Una hernia ocurre cuando un rgano o un tejido interno protruye a travs de un punto dbil en los msculos del vientre. Esto forma un bulto. El bulto puede estar: En Neomia Dear cicatriz de [place]. Este tipo de bulto se denomina hernia incisional. Cerca del ombligo. Este tipo de bulto se denomina hernia umbilical. En la ingle, que es la zona en la que se unen las piernas con la parte inferior del vientre. Este tipo se denomina hernia inguinal. Si usted es hombre, este tipo tambin podra Astronomer. En la parte superior del muslo. Este tipo se denomina hernia crural. Dentro del vientre. Este tipo se denomina hernia de hiato. Se produce cuando el estmago se desliza por arriba del diafragma, que es el msculo que est entre el vientre y [place]. Cules son las causas? Puede salirle una hernia si: Levanta objetos pesados. Tose durante mucho tiempo. Tiene dificultades para defecar, lo que tambin se llama estreimiento. La dificultad para defecar puede provocar esfuerzo. Tiene un corte de una ciruga en el vientre. Tiene un problema desde su nacimiento. Tiene lquido alrededor del vientre. Es hombre y tiene un testculo que no ha descendido al escroto. Puede estar expuesto a un riesgo mayor de tener una hernia si: Fuma. Tiene mucho sobrepeso. Cules son los signos o sntomas? El sntoma principal es un bulto, pero es posible que no siempre est visible. Este puede hacerse ms grande o ms visible cuando tose o hace un esfuerzo, como cuando levanta un objeto pesado. Si puede volver a introducir el bulto en el vientre empujndolo, es posible que no le cause dolor. Si no puede volver a introducirlo en el vientre, este puede perder su irrigacin sangunea. Esto puede causar lo siguiente: Dolor. Grant Ruts. Nuseas y vmitos. Hinchazn. Problemas para defecar. Cmo se diagnostica? Una hernia se puede diagnosticar en funcin de los sntomas, los  antecedentes mdicos y Nurse, learning disability. El mdico puede pedirle que tosa o se mueva de un modo que le facilite ver el bulto. Tambin pueden hacerle pruebas. Estas pueden incluir: Radiografas. Ecografa. Exploracin por tomografa computarizada (TC). Cmo se trata? Es posible que una hernia pequea e indolora no necesite tratamiento. Una hernia grande o dolorosa puede tratarse con [place]. La ciruga incluye volver a Arts administrator y reparar la zona dbil del msculo o del vientre. Siga estas instrucciones en su casa: Actividad Trate de no hacer esfuerzos. Es posible que Personnel officer objetos. Pregunte cunto peso puede levantar de manera segura. Si levanta algo pesado, use los msculos de las piernas. No use los msculos de la espalda para Financial controller. Evite la dificultad para defecar Es posible que deba tomar estas medidas para prevenir o tratar los problemas para defecar: Product manager suficiente lquido para Radio producer pis (orina) de color amarillo plido. Tomar medicamentos que lo ayuden a Advertising copywriter. Coma alimentos ricos en fibra. Estos incluyen frijoles, cereales integrales y frutas y verduras frescas. Instrucciones generales Cuando tosa, hgalo con suavidad. Intente empujar el bulto hacia adentro presionando muy suavemente sobre este cuando est [place]. No intente forzarlo hacia adentro nuevamente si este no entra fcilmente. Si tiene sobrepeso, trabaje con el mdico para bajar de peso sin riegos. No fume ni use vapeadores o productos que tengan nicotina o tabaco. Si necesita ayuda para dejar de fumar, hable con su mdico. Si van a hacerle una ciruga, controle la hernia para detectar cualquier cambio en la forma, tamao o color. Informe  al mdico si nota algn cambio. Comunquese con un mdico si: Tiene un dolor nuevo, hinchazn o enrojecimiento cerca de la hernia. Tiene dificultad para defecar. Hace heces ms duras o grandes que lo normal. Tiene fiebre o  escalofros. Tiene nuseas o vmitos. La hernia no vuelve hacia adentro. Solicite ayuda de inmediato si: Siente un dolor en la zona baja del vientre que [place]. La hernia: Cambia de forma o de tamao. Cambia de color. Se siente dura o le duele cuando la toca. Estos sntomas pueden Customer service manager. Llame al 911 de inmediato. No espere a ver si los sntomas desaparecen. No conduzca por sus propios medios [employer]. Esta informacin no tiene Theme park manager el consejo del mdico. Asegrese de hacerle al mdico cualquier pregunta que tenga. Document Revised: 11/30/2022 Document Reviewed: 11/30/2022 Elsevier Patient Education  2024 [employer].

## 2023-08-30 ENCOUNTER — Ambulatory Visit: Payer: Self-pay | Admitting: Physician Assistant

## 2023-09-04 ENCOUNTER — Ambulatory Visit: Payer: Self-pay | Admitting: Physician Assistant

## 2023-09-04 ENCOUNTER — Encounter: Payer: Self-pay | Admitting: Physician Assistant

## 2023-09-04 VITALS — BP 138/86 | HR 63 | Temp 97.6°F | Wt 154.0 lb

## 2023-09-04 DIAGNOSIS — K219 Gastro-esophageal reflux disease without esophagitis: Secondary | ICD-10-CM

## 2023-09-04 DIAGNOSIS — Z789 Other specified health status: Secondary | ICD-10-CM

## 2023-09-04 DIAGNOSIS — Z853 Personal history of malignant neoplasm of breast: Secondary | ICD-10-CM

## 2023-09-04 NOTE — Patient Instructions (Signed)
Hacer ejercicio para mantenerse sano Exercising to Stay Healthy Para estar sano y [place] as, es recomendable hacer ejercicio de intensidad moderada y de intensidad vigorosa. Puede saber si est haciendo ejercicio de intensidad moderada si su corazn comienza a latir ms rpido y su respiracin se vuelve ms rpida, pero an Therapist, occupational. Puede saber que est haciendo ejercicio de intensidad vigorosa si respira con mucha ms dificultad y rapidez, y no puede Pharmacologist una conversacin. Cmo puede beneficiarme el ejercicio? Hacer actividad fsica con regularidad es muy importante. Tiene muchos otros beneficios, como por ejemplo: Mejora el estado fsico general, la flexibilidad y la resistencia. Aumenta la densidad sea. Ayuda a Art gallery manager. Disminuye la Art gallery manager. Aumenta la fuerza y la resistencia muscular. Reduce el estrs y la tensin, la ansiedad, la depresin o la ira. Mejora el estado de salud general. Qu pautas debo seguir mientras hago ejercicio? Hable con el mdico antes de comenzar un programa nuevo de [place] fsica. No haga ejercicio en exceso que pudiera hacer que se lastime, se sienta mareado o tenga dificultad para respirar. Use ropa cmoda y calzado con buen soporte. Beba gran cantidad de agua mientras hace ejercicio para evitar la deshidratacin o los golpes de Airline pilot. Haga ejercicio hasta que se aceleren su respiracin y sus latidos cardacos (intensidad [place]). Con qu frecuencia debera hacer ejercicio? Elija una actividad que disfrute y establezca objetivos realistas. El mdico puede ayudarlo a Event organiser un plan de actividades, diseado de [place]man[place] individual y que funcione mejor para usted. Haga actividad fsica habitualmente como se lo haya indicado el mdico. Esto puede incluir: Hacer ejercicios de fortalecimiento muscular dos veces a la semana, como: Levantamiento de pesas. Uso de bandas elsticas de resistencia. Flexiones de  [employer]. Abdominales. Yoga. Realizar ejercicio de [place] cierta intensidad durante una cantidad determinada de [place]. Elija entre estas opciones: Un total de 150 minutos de ejercicio de intensidad moderada cada semana. Un total de 75 minutos de ejercicio de intensidad vigorosa cada semana. Burlene Arnt de ejercicio de intensidad moderada y vigorosa cada semana. Los nios, las mujeres [place], las personas que no han hecho actividad fsica con regularidad, las personas que tienen sobrepeso y los adultos mayores tal vez tengan que consultar a un mdico sobre qu actividades son seguras para Education officer, environmental. Si tiene Dispensing optician, asegrese de Science writer al mdico antes de comenzar un programa de ejercicios nuevo. Cules son algunas ideas para hacer ejercicio? Algunas ideas de ejercicio de intensidad moderada incluyen: Caminar 1 milla (1.6 km) en aproximadamente 15 minutos. Andar en bicicleta. Practicar senderismo. Jugar al golf. Bailar. Gimnasia acutica. Algunas ideas de ejercicio de intensidad vigorosa incluyen: Caminar 4.5 millas (7.2 km) o ms en aproximadamente una hora. Trotar o correr 5 millas (8 km) en aproximadamente una hora. Andar en bicicleta 10 millas (16.1 km) o ms en aproximadamente una hora. Practicar natacin. Practicar patinaje de ruedas o en lnea. Hacer esqu de fondo. Hacer deportes competitivos vigorosos, como ftbol americano, bsquet y ftbol. Saltar la cuerda. Tomar clases de baile [place] son algunas actividades diarias que pueden ayudarme a hacer ejercicio? Trabajar en el jardn, como: Empujar una cortadora de csped. Juntar y embolsar hojas. Lavar el automvil. Empujar un cochecito. Palear nieve. Cuidar el jardn. Lavar las ventanas o los pisos. Cmo puedo ser ms activo en mis actividades diarias? Utilice las Microbiologist del ascensor. Vaya a caminar durante su hora de almuerzo. Si conduce, estacione el automvil ms lejos del trabajo o de  la escuela. Si [place] transporte  pblico, bjese una parada antes y camine el resto del camino. Pngase de pie o camine durante todas las llamadas telefnicas que haga mientras est adentro. Levntese, estrese y camine cada 30 minutos a lo largo del Futures trader. Haga ejercicio con Leisure centre manager. El apoyo para continuar haciendo ejercicio lo ayudar a Pharmacologist una rutina de actividad frecuente. Dnde obtener ms informacin Puede obtener ms informacin acerca de cmo hacer actividad fsica para mantenerse sano en: U.S. Department of Health and [employer] (Departamento de Salud y Servicios Humanos de los Estados Unidos): ThisPath.fi Centers for Disease Control and Prevention Insurance claims handler) (Centros para Air traffic controller y la Prevencin de Event organiser): FootballExhibition.com.br Resumen Hacer actividad fsica con regularidad es muy importante. Mejorar el estado fsico general, la flexibilidad y la resistencia. El ejercicio regular tambin mejorar la salud general. Michele Salas a controlar su peso, reducir el estrs y Scientist, clinical (histocompatibility and immunogenetics) la densidad sea. No haga ejercicio en exceso que pudiera hacer que se lastime, se sienta mareado o tenga dificultad para respirar. Hable con el mdico antes de comenzar un programa nuevo de [place] fsica. Esta informacin no tiene Theme park manager el consejo del mdico. Asegrese de hacerle al mdico cualquier pregunta que tenga. Document Revised: 12/03/2020 Document Reviewed: 12/03/2020 Elsevier Patient Education  2024 [employer].

## 2023-09-04 NOTE — Progress Notes (Signed)
BP 138/86   Pulse 63   Temp 97.6 F (36.4 C)   Wt 154 lb (69.9 kg)   SpO2 99%   BMI 28.63 kg/m    Subjective:    Patient ID: Michele Salas, female    DOB: [birth year], 43 y.o.   MRN: [id number]  HPI: Shawnay Salas is a 43 y.o. female presenting on 09/04/2023 for No chief complaint on file.   HPI   Pt is in today for routine follow up.  Pt has Hx breast cancer.  Last seen by oncology 12/16/22. - has appt there feb 14.  Pt says she is feeling well and she has no complaints.  She says that she does not work or exercise but is too busy to exercise.   .   Relevant past medical, surgical, family and social history reviewed and updated as indicated. Interim medical history since our last visit reviewed. Allergies and medications reviewed and updated.   Current Outpatient Medications:    omeprazole (PRILOSEC) 40 MG capsule, Take 1 capsule (40 mg total) by mouth daily as needed., Disp: 30 capsule, Rfl: 6   Vitamin D, Ergocalciferol, (DRISDOL) 1.25 MG (50000 UNIT) CAPS capsule, Take 1 capsule (50,000 Units total) by mouth once a week., Disp: 16 capsule, Rfl: 3   Review of Systems  Per HPI unless specifically indicated above     Objective:    BP 138/86   Pulse 63   Temp 97.6 F (36.4 C)   Wt 154 lb (69.9 kg)   SpO2 99%   BMI 28.63 kg/m   Wt Readings from Last 3 Encounters:  09/04/23 154 lb (69.9 kg)  03/01/23 151 lb (68.5 kg)  12/16/22 148 lb 3.2 oz (67.2 kg)    Physical Exam Vitals reviewed.  Constitutional:      General: She is not in acute distress.    Appearance: She is well-developed. She is not toxic-appearing.  HENT:     Head: Normocephalic and atraumatic.  Eyes:     Extraocular Movements: Extraocular movements intact.     Pupils: Pupils are equal, round, and reactive to light.  Cardiovascular:     Rate and Rhythm: Normal rate and regular rhythm.     Pulses:          Radial pulses are 2+ on the right side and 2+ on the left side.  Pulmonary:      Effort: Pulmonary effort is normal.     Breath sounds: Normal breath sounds.  Abdominal:     General: Bowel sounds are normal.     Palpations: Abdomen is soft. There is no mass.     Tenderness: There is no abdominal tenderness.  Musculoskeletal:     Cervical back: Neck supple.     Right lower leg: No edema.     Left lower leg: No edema.  Lymphadenopathy:     Cervical: No cervical adenopathy.  Skin:    General: Skin is warm and dry.  Neurological:     Mental Status: She is alert and oriented to person, place, and time.     Motor: No weakness or tremor.  Psychiatric:        Behavior: Behavior normal.           Assessment & Plan:    Encounter Diagnoses  Name Primary?   Gastroesophageal reflux disease, unspecified whether esophagitis present Yes   History of breast cancer    Not proficient in English language      -Pt is scheduled to  RTO to update PAP -Bp is midly elevated today.  Will recheck at next OV -Pt to continue with oncology as scheduled -Will check A1c and lipids prior to next OV -Pt is encouraged to exercise regularly to help with overall health.  She is given reading information on this

## 2023-09-15 ENCOUNTER — Inpatient Hospital Stay: Payer: Self-pay | Attending: Hematology

## 2023-09-15 DIAGNOSIS — D500 Iron deficiency anemia secondary to blood loss (chronic): Secondary | ICD-10-CM | POA: Insufficient documentation

## 2023-09-15 DIAGNOSIS — Z9012 Acquired absence of left breast and nipple: Secondary | ICD-10-CM | POA: Insufficient documentation

## 2023-09-15 DIAGNOSIS — N920 Excessive and frequent menstruation with regular cycle: Secondary | ICD-10-CM | POA: Insufficient documentation

## 2023-09-15 DIAGNOSIS — E559 Vitamin D deficiency, unspecified: Secondary | ICD-10-CM | POA: Insufficient documentation

## 2023-09-15 DIAGNOSIS — Z853 Personal history of malignant neoplasm of breast: Secondary | ICD-10-CM | POA: Insufficient documentation

## 2023-09-15 LAB — CBC WITH DIFFERENTIAL/PLATELET
Abs Immature Granulocytes: 0.05 10*3/uL (ref 0.00–0.07)
Basophils Absolute: 0.1 10*3/uL (ref 0.0–0.1)
Basophils Relative: 1 %
Eosinophils Absolute: 0.2 10*3/uL (ref 0.0–0.5)
Eosinophils Relative: 2 %
HCT: 29.9 % — ABNORMAL LOW (ref 36.0–46.0)
Hemoglobin: 9 g/dL — ABNORMAL LOW (ref 12.0–15.0)
Immature Granulocytes: 1 %
Lymphocytes Relative: 30 %
Lymphs Abs: 2.2 10*3/uL (ref 0.7–4.0)
MCH: 21.7 pg — ABNORMAL LOW (ref 26.0–34.0)
MCHC: 30.1 g/dL (ref 30.0–36.0)
MCV: 72.2 fL — ABNORMAL LOW (ref 80.0–100.0)
Monocytes Absolute: 0.6 10*3/uL (ref 0.1–1.0)
Monocytes Relative: 8 %
Neutro Abs: 4.3 10*3/uL (ref 1.7–7.7)
Neutrophils Relative %: 58 %
Platelets: 429 10*3/uL — ABNORMAL HIGH (ref 150–400)
RBC: 4.14 MIL/uL (ref 3.87–5.11)
RDW: 21 % — ABNORMAL HIGH (ref 11.5–15.5)
WBC: 7.4 10*3/uL (ref 4.0–10.5)
nRBC: 0 % (ref 0.0–0.2)

## 2023-09-15 LAB — COMPREHENSIVE METABOLIC PANEL
ALT: 26 U/L (ref 0–44)
AST: 23 U/L (ref 15–41)
Albumin: 3.8 g/dL (ref 3.5–5.0)
Alkaline Phosphatase: 85 U/L (ref 38–126)
Anion gap: 9 (ref 5–15)
BUN: 10 mg/dL (ref 6–20)
CO2: 23 mmol/L (ref 22–32)
Calcium: 9 mg/dL (ref 8.9–10.3)
Chloride: 106 mmol/L (ref 98–111)
Creatinine, Ser: 0.52 mg/dL (ref 0.44–1.00)
GFR, Estimated: >60 mL/min (ref >60)
Glucose, Bld: 111 mg/dL — ABNORMAL HIGH (ref 70–99)
Potassium: 4 mmol/L (ref 3.5–5.1)
Sodium: 138 mmol/L (ref 135–145)
Total Bilirubin: 0.6 mg/dL (ref 0.0–1.2)
Total Protein: 7.1 g/dL (ref 6.5–8.1)

## 2023-09-15 LAB — IRON AND TIBC
Iron: 26 ug/dL — ABNORMAL LOW (ref 28–170)
Saturation Ratios: 5 % — ABNORMAL LOW (ref 10.4–31.8)
TIBC: 534 ug/dL — ABNORMAL HIGH (ref 250–450)
UIBC: 508 ug/dL

## 2023-09-15 LAB — FERRITIN: Ferritin: 34 ng/mL (ref 11–307)

## 2023-09-15 LAB — VITAMIN D 25 HYDROXY (VIT D DEFICIENCY, FRACTURES): Vit D, 25-Hydroxy: 40.49 ng/mL (ref 30–100)

## 2023-09-22 ENCOUNTER — Inpatient Hospital Stay (HOSPITAL_BASED_OUTPATIENT_CLINIC_OR_DEPARTMENT_OTHER): Payer: Self-pay | Admitting: Oncology

## 2023-09-22 VITALS — BP 139/81 | HR 73 | Temp 98.0°F | Resp 18 | Wt 150.0 lb

## 2023-09-22 DIAGNOSIS — C50112 Malignant neoplasm of central portion of left female breast: Secondary | ICD-10-CM

## 2023-09-22 DIAGNOSIS — E559 Vitamin D deficiency, unspecified: Secondary | ICD-10-CM

## 2023-09-22 DIAGNOSIS — Z171 Estrogen receptor negative status [ER-]: Secondary | ICD-10-CM

## 2023-09-22 DIAGNOSIS — D500 Iron deficiency anemia secondary to blood loss (chronic): Secondary | ICD-10-CM

## 2023-09-22 NOTE — Progress Notes (Signed)
[name] [address], [place] [address]   CLINIC:  Medical Oncology/Hematology  PCP:  Jacquelin Hawking, PA-C [address] / [name] [address] [phone]   REASON FOR VISIT:    Follow-up for history of left breast triple negative breast cancer and iron deficiency anemia  BRIEF ONCOLOGIC HISTORY:   Oncology History  Cancer of central portion of left female breast (HCC)  09/16/2015 Mammogram   1.6 cm retroareolar left breast mass measuring 1.6 cm in largest diameter in addition to 3 abnormal appearing left inferior axillary lymph nodes.   09/23/2015 Initial Biopsy   US guided core biopsy of left breast mass and suspicious left axillary lymph nodes.   09/23/2015 Pathology Results   Invasive ductal carcinoma, high grade.  Left axilla lymph node is positive for metastatic carcinoma.  ER/PR NEGATIVE, HER2 NEGATIVE, KI-67 marker of 85%.   10/26/2015 Imaging   Bone scan- No evidence of metastatic disease to the skeleton.   10/27/2015 Imaging   CT CAP-  Retroareolar mass with 2 asymmetrically prominent left axillary lymph nodes and an upper normal size left subpectoral lymph node. No other findings of metastatic disease in the chest, abdomen, or pelvis.   11/05/2015 Echocardiogram   The estimated ejection   fraction was in the range of 60% to 65%.   11/23/2015 Miscellaneous   Genetic Counseling- genetic test result was negative for any known pathogenic mutations within any of 28 genes that would cause her to be at an increased genetic risk for breast, ovarian, or other related cancers.   12/09/2015 Surgery   L radical mastectomy, axillary LN dissection, port a cath insertion with Dr. Franky Macho 12/09/2015   12/11/2015 Pathology Results   - INVASIVE GRADE III DUCTAL CARCINOMA, SPANNING 2.3 CM IN GREATEST DIMENSION. - ASSOCIATED HIGH GRADE DCIS - MARGINS ARE NEGATIVE. 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 - EIGHT ADDITIONAL LYMPH  NODES WITH NO TUMOR SEEN   12/11/2015 Pathology Results   HER2 NEGATIVE, ER 0%, PR 0%, Ki-67 marker 90%   12/25/2015 - 02/12/2016 Chemotherapy   AC every 2 weeks x 4 cycles   02/04/2016 Treatment Plan Change   Treatment deferred x 7 days due to neutropenia   02/25/2016 - 05/12/2016 Chemotherapy   Paclitaxel weekly x 12   06/01/2016 - 07/19/2016 Radiation Therapy   33 fractions, Dr. Mitzi Hansen in [place]   02/06/2017 Surgery   (L) breast reconstruction (lat flap with tissue expander) with Dr. Leta Baptist     CANCER STAGING:  Cancer Staging  Cancer of central portion of left female breast [facility]) Staging form: Breast, AJCC 7th Edition - Pathologic stage from 12/11/2015: Stage IIB (T2, N1a, cM0) - Signed by Ellouise Newer, PA-C on 12/31/2015   INTERVAL HISTORY:   [name], a 43 y.o. female, returns for routine follow-up of her history of left breast triple negative breast cancer and iron deficiency anemia. [name] was last seen on 06/24/2022 by Rojelio Brenner PA-C.  Reports she has been doing well since her last visit.  No new lumps or breast pain.  She had a mammogram in May which was BI-RADS Category 1 negative.  Reports heavy menstrual cycles lasting approximately 8 days with lots of clotting.  Reports new onset dizziness and headaches.  Energy levels have declined and are 60%.  Appetite is at 100%.  Denies any pain.  Has occasional constipation secondary to iron tablets.  She denies any melena or rectal bleeding.  She receives intermittent iron last given in December 2023/January 2024.No pica, restless legs, or abnormal headaches.  She continues to take vitamin D 50,000 units once a week.   ASSESSMENT & PLAN:  1.   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  She had a mammogram performed 09/16/2015 that showed a retroareolar left breast mass measuring 1.6 cm with 3 abnormal left axillary lymph nodes noted. - She had a biopsy  09/23/2015 of the left breast mass as well as a left axillary lymph node. PATHOLOGY: Invasive ductal carcinoma high-grade left axillary lymph node was positive for metastatic cancer. ER-/PR-/HER2- Ki-67 was 85%. - Bone scan on 10/26/2015 that was negative. - CT CAP was done 10/27/2015 showed the left breast mass and left axillary lymph nodes with no other evidence of metastatic disease. - Dr. Lovell Sheehan performed a left mastectomy and axillary lymph node dissection on 12/11/2015.  PATHOLOGY reported invasive ductal grade 3 measuring 2.3 cm.  With 2 out of 10 lymph nodes positive for malignancy. ER-/PR-/HER2- -She was treated with 4 cycles of AC followed by 12 weeks of Taxol which was completed October 2017. -She was also treated with radiation 33 fractions which was completed in December 2017. - - - - - - - - - - - - - - - - - - - - - - - - - - - Left breast implant site is within normal limits.  Right breast with palpable implant, no palpable masses.  No palpable adenopathy. - Most recent unilateral right breast mammogram (12/09/2022): No mammographic evidence of malignancy in the right breast.   2.  Iron deficiency anemia: - She has history of menorrhagia.  No bright red blood per rectum or melena. - Iron tablet discontinued due to severe constipation  - She has received intermittent IV iron, most recently with Venofer 300 mg x 3 in December 2023/January 2024  3.   Vitamin D deficiency: - She is taking vitamin D 50,000 units weekly  PLAN: 1. Malignant neoplasm of central portion of left breast in female, estrogen receptor negative (HCC) (Primary) -Breast exam today unremarkable. -Repeat mammogram in May 2025.  Orders placed.  Return to clinic after mammogram to review results. -Labs from 09/15/2023 show normal LFTs.  - MM 3D SCREENING MAMMOGRAM UNILATERAL RIGHT BREAST W/IMPLANT; Future  2. Iron deficiency anemia due to chronic blood loss -She has received intermittent IV iron last given on  07/15/2022, 07/29/2022 and 08/09/2022.  She received 300 mg IV Venofer. -She is currently taking and tolerating iron supplements daily. -Reports heavy menstrual cycles with 7 to 8 days of clotting. -She has not been seen by gynecologist. -She is symptomatic with extreme fatigue and dizziness when she stands. -Labs from 09/15/2023 show hemoglobin of 9 (13.2), MCV 72.2 (87.9) and platelet count 429 (320).  Iron saturations are 5%, TIBC elevated 534 and ferritin is 34. -Recommend 3 doses of IV Venofer 300 mg given about a week apart. -Continue oral iron supplements. -Return to clinic after mammogram for repeat labs and see provider.  3. Vitamin D deficiency -Continue regimen D50 1000 units weekly. -Vitamin D level 40.49. -Recheck labs in 6 months.  PLAN SUMMARY: >> Mammogram in May 2025. >> Recommend 3 doses of IV Venofer (300 mg). >> Return to clinic after mammogram to review results and labs and see provider in clinic.    REVIEW OF SYSTEMS:   Review of Systems  Constitutional:  Positive for fatigue.  HENT:   Positive for trouble swallowing.   Gastrointestinal:  Positive for constipation.  Genitourinary:  Positive for vaginal bleeding.   Neurological:  Positive for dizziness and headaches.  Psychiatric/Behavioral:  Positive for sleep disturbance.     PHYSICAL EXAM:   Performance status (ECOG): 0 - Asymptomatic  Vitals:   09/22/23 1122  BP: 139/81  Pulse: 73  Resp: 18  Temp: 98 F (36.7 C)  SpO2: 98%   Wt Readings from Last 3 Encounters:  09/22/23 150 lb (68 kg)  09/04/23 154 lb (69.9 kg)  03/01/23 151 lb (68.5 kg)   Physical Exam Constitutional:      Appearance: Normal appearance.  HENT:     Head: Normocephalic and atraumatic.  Eyes:     Pupils: Pupils are equal, round, and reactive to light.  Cardiovascular:     Rate and Rhythm: Normal rate and regular rhythm.     Heart sounds: Normal heart sounds. No murmur heard. Pulmonary:     Effort: Pulmonary effort is  normal.     Breath sounds: Normal breath sounds. No wheezing.  Chest:     Comments: Left breast mastectomy site s/p implant is within normal limits. Right breast with palpable implant; no palpable masses or nodules within the remaining breast tissue. No palpable lymphadenopathy.  Abdominal:     General: Bowel sounds are normal. There is no distension.     Palpations: Abdomen is soft.     Tenderness: There is no abdominal tenderness.  Musculoskeletal:        General: Normal range of motion.     Cervical back: Normal range of motion.  Skin:    General: Skin is warm and dry.     Findings: No rash.  Neurological:     Mental Status: She is alert and oriented to person, place, and time.  Psychiatric:        Judgment: Judgment normal.      PAST MEDICAL/SURGICAL HISTORY:  Past Medical History:  Diagnosis Date   History of breast cancer 10/2015   left   History of chemotherapy 2017   History of radiation therapy    PONV (postoperative nausea and vomiting)    nausea only   Past Surgical History:  Procedure Laterality Date   BREAST ENHANCEMENT SURGERY Right 05/19/2017   Procedure: Augmentation Right Breast with Saline Implant for symmetry;  Surgeon: Glenna Fellows, MD;  Location: [facility];  Service: Plastics;  Laterality: Right;   LATISSIMUS FLAP TO BREAST Left 02/06/2017   Procedure: LEFT LATISSIMUS FLAP TO BREAST;  Surgeon: Glenna Fellows, MD;  Location: [facility];  Service: Plastics;  Laterality: Left;   MASTECTOMY MODIFIED RADICAL Left 12/09/2015   Procedure: LEFT MODIFIED RADICAL MASTECTOMY;  Surgeon: Franky Macho, MD;  Location: [facility];  Service: General;  Laterality: Left;   MASTOPEXY Right 12/22/2017   Procedure: RIGHT MASTOPEXY;  Surgeon: Glenna Fellows, MD;  Location: [facility];  Service: Plastics;  Laterality: Right;   PORT-A-CATH REMOVAL Right 05/19/2017   Procedure: REMOVAL PORT-A-CATH;  Surgeon: Glenna Fellows, MD;  Location: [facility];  Service: Plastics;  Laterality: Right;   PORTACATH PLACEMENT Right 12/09/2015   Procedure: INSERTION PORT-A-CATH RIGHT SUBCLAVIAN;  Surgeon: Franky Macho, MD;  Location: [facility];  Service: General;  Laterality: Right;   REMOVAL OF TISSUE EXPANDER AND PLACEMENT OF IMPLANT Left 05/19/2017   Procedure: REMOVAL OF LEFT TISSUE EXPANDER AND PLACEMENT OF SALINE IMPLANT;  Surgeon: Glenna Fellows, MD;  Location: [facility];  Service: Government social research officer;  Laterality: Left;   SKIN FULL THICKNESS GRAFT Left 12/22/2017   Procedure: LEFT NIPPLE AREOLAR COMPLEX RECONSTRUCTION WITH LOCAL FLAP  AND FULL THICKNESS SKIN GRAFT FROM LEFT GROIN;  Surgeon: Glenna Fellows, MD;  Location: [facility];  Service: Plastics;  Laterality: Left;   TISSUE EXPANDER PLACEMENT Left 02/06/2017   Procedure: TISSUE EXPANDER PLACEMENT LEFT BREAST;  Surgeon: Glenna Fellows, MD;  Location: [facility];  Service: Plastics;  Laterality: Left;    SOCIAL HISTORY:  Social History   Socioeconomic History   Marital status: Married    Spouse name: Not on file   Number of children: Not on file   Years of education: Not on file   Highest education level: Not on file  Occupational History   Not on file  Tobacco Use   Smoking status: Never   Smokeless tobacco: Never  Vaping Use   Vaping status: Never Used  Substance and Sexual Activity   Alcohol use: No   Drug use: No   Sexual activity: Yes    Birth control/protection: Condom  Other Topics Concern   Not on file  Social History Narrative   ** Merged History Encounter **       Social Drivers of Corporate investment banker Strain: Not on file  Food Insecurity: Not on file  Transportation Needs: Not on file  Physical Activity: Not on file  Stress: Not on file  Social Connections: Not on file  Intimate Partner Violence: Not on file    FAMILY HISTORY:  Family History  Problem Relation Age of Onset   Diabetes Mother    Heart attack  Paternal Uncle    Heart attack Paternal Grandfather 67    CURRENT MEDICATIONS:  Current Outpatient Medications  Medication Sig Dispense Refill   omeprazole (PRILOSEC) 40 MG capsule Take 1 capsule (40 mg total) by mouth daily as needed. 30 capsule 6   Vitamin D, Ergocalciferol, (DRISDOL) 1.25 MG (50000 UNIT) CAPS capsule Take 1 capsule (50,000 Units total) by mouth once a week. 16 capsule 3   No current facility-administered medications for this visit.    ALLERGIES:  No Known Allergies  LABORATORY DATA:  I have reviewed the labs as listed.     Latest Ref Rng & Units 09/15/2023    8:12 AM 12/09/2022   10:20 AM 06/17/2022   10:02 AM  CBC  WBC 4.0 - 10.5 K/uL 7.4  5.7  6.5   Hemoglobin 12.0 - 15.0 g/dL 9.0  04.5  40.9   Hematocrit 36.0 - 46.0 % 29.9  39.2  42.2   Platelets 150 - 400 K/uL 429  320  290       Latest Ref Rng & Units 09/15/2023    8:12 AM 12/09/2022   10:20 AM 06/17/2022   10:02 AM  CMP  Glucose 70 - 99 mg/dL 811  914  782   BUN 6 - 20 mg/dL 10  11  10    Creatinine 0.44 - 1.00 mg/dL 9.56  2.13  0.86   Sodium 135 - 145 mmol/L 138  137  139   Potassium 3.5 - 5.1 mmol/L 4.0  3.8  4.0   Chloride 98 - 111 mmol/L 106  104  107   CO2 22 - 32 mmol/L 23  25  25    Calcium 8.9 - 10.3 mg/dL 9.0  8.9  9.5   Total Protein 6.5 - 8.1 g/dL 7.1  7.5  7.5   Total Bilirubin 0.0 -  1.2 mg/dL 0.6  0.9  0.8   Alkaline Phos 38 - 126 U/L 85  80  77   AST 15 - 41 U/L 23  21  27    ALT 0 - 44 U/L 26  29  45     DIAGNOSTIC IMAGING:  I have independently reviewed the scans and discussed with the patient. No results found.   WRAP UP:  All questions were answered. The patient knows to call the clinic with any problems, questions or concerns.  Medical decision making: Moderate  Time spent on visit: I spent 20 minutes counseling the patient face to face. The total time spent in the appointment was 30 minutes and more than 50% was on counseling.  NOTE: Visit performed with assistance of  certified Spanish language medical interpreter.    Durenda Hurt, NP 09/22/2023 11:55 AM

## 2023-09-29 ENCOUNTER — Inpatient Hospital Stay: Payer: Self-pay

## 2023-09-29 VITALS — BP 122/74 | HR 72 | Temp 97.9°F | Resp 19 | Wt 151.2 lb

## 2023-09-29 DIAGNOSIS — D509 Iron deficiency anemia, unspecified: Secondary | ICD-10-CM

## 2023-09-29 MED ORDER — CETIRIZINE HCL 10 MG PO TABS
10.0000 mg | ORAL_TABLET | Freq: Once | ORAL | Status: AC
  Administered 2023-09-29: 10 mg via ORAL
  Filled 2023-09-29: qty 1

## 2023-09-29 MED ORDER — SODIUM CHLORIDE 0.9 % IV SOLN: Freq: Once | INTRAVENOUS | Status: AC

## 2023-09-29 MED ORDER — ACETAMINOPHEN 325 MG PO TABS
650.0000 mg | ORAL_TABLET | Freq: Once | ORAL | Status: AC
  Administered 2023-09-29: 650 mg via ORAL
  Filled 2023-09-29: qty 2

## 2023-09-29 MED ORDER — SODIUM CHLORIDE 0.9 % IV SOLN
300.0000 mg | Freq: Once | INTRAVENOUS | Status: AC
  Administered 2023-09-29: 300 mg via INTRAVENOUS
  Filled 2023-09-29: qty 300

## 2023-09-29 NOTE — Progress Notes (Signed)
Patient tolerated iron infusion with no complaints voiced.  Peripheral IV site clean and dry with good blood return noted before and after infusion.  Band aid applied.  Pt observed for 30 minutes post iron infusion without any complications. Translator stayed with patient throughout the whole infusion. VSS with discharge and left in satisfactory condition with no s/s of distress noted. All follow ups as scheduled.   Michele Salas [employer]

## 2023-09-29 NOTE — Patient Instructions (Signed)

## 2023-10-05 ENCOUNTER — Ambulatory Visit: Payer: Self-pay | Admitting: Physician Assistant

## 2023-10-06 ENCOUNTER — Inpatient Hospital Stay: Payer: Self-pay

## 2023-10-06 VITALS — BP 133/81 | HR 63 | Temp 97.6°F | Resp 17

## 2023-10-06 DIAGNOSIS — D509 Iron deficiency anemia, unspecified: Secondary | ICD-10-CM

## 2023-10-06 MED ORDER — CETIRIZINE HCL 10 MG PO TABS
10.0000 mg | ORAL_TABLET | Freq: Once | ORAL | Status: AC
  Administered 2023-10-06: 10 mg via ORAL
  Filled 2023-10-06: qty 1

## 2023-10-06 MED ORDER — SODIUM CHLORIDE 0.9 % IV SOLN: Freq: Once | INTRAVENOUS | Status: AC

## 2023-10-06 MED ORDER — SODIUM CHLORIDE 0.9 % IV SOLN
300.0000 mg | Freq: Once | INTRAVENOUS | Status: AC
  Administered 2023-10-06: 300 mg via INTRAVENOUS
  Filled 2023-10-06: qty 300

## 2023-10-06 MED ORDER — ACETAMINOPHEN 325 MG PO TABS
650.0000 mg | ORAL_TABLET | Freq: Once | ORAL | Status: AC
  Administered 2023-10-06: 650 mg via ORAL
  Filled 2023-10-06: qty 2

## 2023-10-06 NOTE — Progress Notes (Signed)
 Patient presents today for iron infusion.  Patient is in satisfactory condition with no new complaints voiced.  Vital signs are stable.  IV placed in R arm.  IV flushed well with good blood return noted.  We will proceed with infusion per provider orders.    Patient tolerated infusion well with no complaints voiced.  Patient left ambulatory in stable condition.  Vital signs stable at discharge.  Follow up as scheduled.

## 2023-10-06 NOTE — Patient Instructions (Signed)
 Instrucciones al darle de alta: Discharge Instructions Gracias por elegir al [facility] [place] para brindarle atencin mdica de oncologa y Teacher, English as a foreign language.  Si usted tiene cita de laboratorio con [employer] de [place], por favor entre por la puerta principal y regstrese en el escritorio central de informacin.   Use ropa cmoda y [place] para tener fcil acceso a las vas del Portacath (acceso venoso de Set designer duracin) o la lnea PICC (catter central colocado por va perifrica).   Nos esforzamos por ofrecerle tiempo de calidad con su proveedor. Es posible que tenga que volver a programar su cita si llega tarde (15 minutos o ms).  El llegar tarde le afecta a usted y a otros pacientes cuyas citas son posteriores a Armed forces operational officer.  Adems, si usted falta a tres o ms citas sin avisar a la oficina, puede ser retirado(a) de la clnica a discrecin del proveedor.      Para las solicitudes de renovacin de recetas, pida a su farmacia que se ponga en contacto con nuestra oficina y deje que transcurran 72 horas para que se complete el proceso de las renovaciones.    Hoy usted recibi los siguientes agentes:  Venofer.  Iron Sucrose Injection Qu es este medicamento? El HIERRO SACAROSA trata los niveles bajos de hierro (anemia por deficiencia de Company secretary) en personas con enfermedad renal. El hierro es un mineral que cumple una funcin importante en la produccin de glbulos rojos, que llevan el oxgeno de los pulmones al resto del cuerpo. Este medicamento puede ser utilizado para otros usos; si tiene alguna pregunta consulte con su proveedor de atencin mdica o con su farmacutico. MARCAS COMUNES: Venofer Qu le debo informar a mi profesional de la salud antes de tomar este medicamento? Necesitan saber si usted presenta alguno de los [employer] o situaciones: Anemia no causada por niveles bajos de hierro Careers information officer altos de hierro en la sangre Enfermedad  renal Enfermedad heptica Una reaccin alrgica o inusual al hierro, a otros medicamentos, alimentos, colorantes o conservantes Si est embarazada o buscando quedar embarazada Si est amamantando a un beb Cmo debo Visual merchandiser medicamento? Este medicamento es para infusin en una vena. Su equipo de atencin lo [employer] en un hospital o en un entorno clnico. Hable con su equipo de atencin sobre el uso de este medicamento en nios. Aunque se puede recetar a nios tan pequeos como de 2 aos de edad con ciertas afecciones, existen precauciones que deben tomarse. Sobredosis: Pngase en contacto inmediatamente con un centro toxicolgico o una sala de urgencia si usted cree que haya tomado demasiado medicamento.<br>ATENCIN: [employer] es solo para usted. No comparta este medicamento con nadie. Qu sucede si me olvido de una dosis? Cumpla con las citas para dosis de seguimiento. Es importante no olvidar ninguna dosis. Llame a su equipo de atencin si no puede asistir a una cita. Qu puede interactuar con este medicamento? No use este medicamento con ninguno de los siguientes productos: Deferoxamina Dimercaprol Otros productos con hierro Industrial/product designer tambin podra Product/process development scientist con los siguientes productos: Cloranfenicol Deferasirox Puede ser que esta lista no menciona todas las posibles interacciones. Informe a su profesional de [employer] de [employer] productos a base de hierbas, medicamentos de Arab o suplementos nutritivos que est tomando. Si usted fuma, consume bebidas alcohlicas o si utiliza drogas ilegales, indqueselo tambin a su profesional de [employer]. Algunas sustancias pueden interactuar con su medicamento. A qu debo estar atento al usar [employer]? Visite a  su equipo de atencin para que revise su evolucin peridicamente. Informe a su equipo de atencin si los sntomas no comienzan a mejorar o si empeoran. Usted podra necesitar realizarse [employer] de  sangre mientras est usando [place]. Es posible que deba comer ms alimentos que contienen hierro. Hable con su equipo de atencin. Los alimentos que contienen hierro incluyen granos o cereales enteros, frutas secas, legumbres, guisantes, verduras de hojas verdes y rganos internos (hgado, rin). Qu efectos secundarios puedo tener al [employer] este medicamento? Efectos secundarios que debe informar a su equipo de atencin tan pronto como sea posible: Reacciones alrgicas: erupcin cutnea, comezn/picazn, urticaria, hinchazn de la cara, los labios, la lengua o la garganta Presin arterial baja: mareo, sensacin de desmayo o aturdimiento, visin borrosa Falta de aliento Efectos secundarios que generalmente no requieren atencin mdica (debe informarlos a su equipo de atencin si persisten o si son molestos): Enrojecimiento Dolor de Public house manager en las articulaciones Dolor muscular Therapist, sports, enrojecimiento o Marketing executive de la inyeccin Puede ser que esta lista no menciona todos los posibles efectos secundarios. Comunquese a su mdico por asesoramiento mdico [employer]. Usted puede informar los efectos secundarios a la FDA por telfono al 1-800-FDA-1088. Dnde debo guardar mi medicina? Este medicamento se administra en hospitales o clnicas. No se guarda en su casa. ATENCIN: Este folleto es un resumen. Puede ser que no cubra toda la posible informacin. Si usted tiene preguntas acerca de esta medicina, consulte con su mdico, su farmacutico o su profesional de Radiographer, therapeutic.  2024 Elsevier/Gold Standard (2023-05-22 00:00:00)  Para ayudar a prevenir las nuseas y los vmitos despus de su tratamiento, le recomendamos que tome su medicamento para las nuseas segn las indicaciones.  LOS SNTOMAS QUE DEBEN COMUNICARSE INMEDIATAMENTE SE INDICAN A CONTINUACIN: *FIEBRE SUPERIOR A 100.4 F (38 C) O MS *ESCALOFROS O SUDORACIN *NUSEAS Y VMITOS QUE  NO SE CONTROLAN CON EL MEDICAMENTO PARA LAS NUSEAS *DIFICULTAD INUSUAL PARA RESPIRAR  *MORETONES O HEMORRAGIAS NO HABITUALES *PROBLEMAS URINARIOS (dolor o ardor al Geographical information systems officer o frecuencia para Geographical information systems officer) *PROBLEMAS INTESTINALES (diarrea inusual, estreimiento, dolor cerca del ano) SENSIBILIDAD EN LA BOCA Y EN LA GARGANTA CON O SIN LA PRESENCIA DE LCERAS (dolor de garganta, llagas en la boca o dolor de muelas/dientes) ERUPCIN, HINCHAZN O DOLORES INUSUALES FLUJO VAGINAL INUSUAL O PICAZN/RASQUIA    Los puntos marcados con un asterisco ( *) indican una posible emergencia y debe hacer un seguimiento tan pronto como le sea posible o vaya al Departamento de Emergencias si se le presenta algn problema.  Por favor, muestre la [place] DE ADVERTENCIA DE Marc Morgans DE ADVERTENCIA DE [place] Fanti al registrarse en [address] de Emergencias y a la enfermera de triaje.  Si tiene preguntas despus de su visita o necesita cancelar o volver a programar su cita, por favor pngase en contacto con [facility] [place] - A DEPT OF Eligha Bridegroom [facility] [phone]  y siga las instrucciones. Las horas de oficina son de 8:00 a.m. a 4:30 p.m. de lunes a viernes. Por favor, tenga en cuenta que los mensajes de voz que se dejan despus de las 4:00 p.m. posiblemente no se devolvern hasta el siguiente da de [place].  Cerramos los fines de semana y [employer]. En todo momento tiene acceso a una enfermera para preguntas urgentes. Por favor, llame al nmero principal de la clnica [phone] y siga las instrucciones.   Para cualquier pregunta que no sea de carcter  urgente, tambin puede ponerse en contacto con su proveedor [employer]. Ahora ofrecemos visitas electrnicas para cualquier persona mayor de 18 aos que solicite atencin mdica en lnea para los sntomas que no sean urgentes. Para ms detalles vaya a [email or web address].   Tambin puede bajar la  aplicacin de MyChart! Vaya a la tienda de aplicaciones, busque "MyChart", abra la aplicacin, seleccione [place], e ingrese con su nombre de usuario y la contrasea de Clinical cytogeneticist.

## 2023-10-13 ENCOUNTER — Inpatient Hospital Stay: Payer: Self-pay

## 2023-10-13 ENCOUNTER — Inpatient Hospital Stay: Payer: Self-pay | Attending: Hematology

## 2023-10-13 VITALS — BP 133/75 | HR 69 | Temp 97.8°F | Resp 18

## 2023-10-13 DIAGNOSIS — D509 Iron deficiency anemia, unspecified: Secondary | ICD-10-CM | POA: Insufficient documentation

## 2023-10-13 MED ORDER — ACETAMINOPHEN 325 MG PO TABS
650.0000 mg | ORAL_TABLET | Freq: Once | ORAL | Status: AC
Start: 2023-10-13 — End: 2023-10-13
  Administered 2023-10-13: 650 mg via ORAL
  Filled 2023-10-13: qty 2

## 2023-10-13 MED ORDER — CETIRIZINE HCL 10 MG PO TABS
10.0000 mg | ORAL_TABLET | Freq: Once | ORAL | Status: AC
  Administered 2023-10-13: 10 mg via ORAL
  Filled 2023-10-13: qty 1

## 2023-10-13 MED ORDER — SODIUM CHLORIDE 0.9 % IV SOLN: Freq: Once | INTRAVENOUS | Status: AC

## 2023-10-13 MED ORDER — IRON SUCROSE 20 MG/ML IV SOLN
300.0000 mg | Freq: Once | INTRAVENOUS | Status: AC
  Administered 2023-10-13: 300 mg via INTRAVENOUS
  Filled 2023-10-13: qty 200

## 2023-10-13 NOTE — Progress Notes (Signed)
 Patient tolerated iron infusion with no complaints voiced.  Peripheral IV site clean and dry with good blood return noted before and after infusion.  Band aid applied.  VSS with discharge and left in satisfactory condition with no s/s of distress noted.

## 2023-10-13 NOTE — Patient Instructions (Signed)
 Instrucciones al darle de alta: Discharge Instructions Gracias por elegir al [facility] de Cncer de [place] para brindarle atencin mdica de oncologa y Teacher, English as a foreign language.  Si usted tiene cita de laboratorio con [employer] de [place], por favor entre por la puerta principal y regstrese en el escritorio central de informacin.   Use ropa cmoda y [place] para tener fcil acceso a las vas del Portacath (acceso venoso de Set designer duracin) o la lnea PICC (catter central colocado por va perifrica).   Nos esforzamos por ofrecerle tiempo de calidad con su proveedor. Es posible que tenga que volver a programar su cita si llega tarde (15 minutos o ms).  El llegar tarde le afecta a usted y a otros pacientes cuyas citas son posteriores a Armed forces operational officer.  Adems, si usted falta a tres o ms citas sin avisar a la oficina, puede ser retirado(a) de la clnica a discrecin del proveedor.      Para las solicitudes de renovacin de recetas, pida a su farmacia que se ponga en contacto con nuestra oficina y deje que transcurran 72 horas para que se complete el proceso de las renovaciones.    Hoy usted recibi los siguientes agentes de quimioterapia e/o inmunoterapia Venofer, return as scheduled.   Para ayudar a prevenir las nuseas y los vmitos despus de su tratamiento, le recomendamos que tome su medicamento para las nuseas segn las indicaciones.  LOS SNTOMAS QUE DEBEN COMUNICARSE INMEDIATAMENTE SE INDICAN A CONTINUACIN: *FIEBRE SUPERIOR A 100.4 F (38 C) O MS *ESCALOFROS O SUDORACIN *NUSEAS Y VMITOS QUE NO SE CONTROLAN CON EL MEDICAMENTO PARA LAS NUSEAS *DIFICULTAD INUSUAL PARA RESPIRAR  *MORETONES O HEMORRAGIAS NO HABITUALES *PROBLEMAS URINARIOS (dolor o ardor al Geographical information systems officer o frecuencia para Geographical information systems officer) *PROBLEMAS INTESTINALES (diarrea inusual, estreimiento, dolor cerca del ano) SENSIBILIDAD EN LA BOCA Y EN LA GARGANTA CON O SIN LA PRESENCIA DE LCERAS (dolor de garganta, llagas en la boca o dolor de  muelas/dientes) ERUPCIN, HINCHAZN O DOLORES INUSUALES FLUJO VAGINAL INUSUAL O PICAZN/RASQUIA    Los puntos marcados con un asterisco ( *) indican una posible emergencia y debe hacer un seguimiento tan pronto como le sea posible o vaya al Departamento de Emergencias si se le presenta algn problema.  Por favor, muestre la [place] DE ADVERTENCIA DE Marc Morgans DE ADVERTENCIA DE Gardiner Fanti al registrarse en [address] de Emergencias y a la enfermera de triaje.  Si tiene preguntas despus de su visita o necesita cancelar o volver a programar su cita, por favor pngase en contacto con [facility] [place] - A DEPT OF Eligha Bridegroom [facility] [phone]  y siga las instrucciones. Las horas de oficina son de 8:00 a.m. a 4:30 p.m. de lunes a viernes. Por favor, tenga en cuenta que los mensajes de voz que se dejan despus de las 4:00 p.m. posiblemente no se devolvern hasta el siguiente da de [place].  Cerramos los fines de semana y [employer]. En todo momento tiene acceso a una enfermera para preguntas urgentes. Por favor, llame al nmero principal de la clnica [phone] y siga las instrucciones.   Para cualquier pregunta que no sea de carcter urgente, tambin puede ponerse en contacto con su proveedor [employer]. Ahora ofrecemos visitas electrnicas para cualquier persona mayor de 18 aos que solicite atencin mdica en lnea para los sntomas que no sean urgentes. Para ms detalles vaya a [email or web address].   Tambin puede bajar la aplicacin de MyChart! Vaya a la tienda de aplicaciones, busque "MyChart",  abra la aplicacin, seleccione [place], e ingrese con su nombre de usuario y la contrasea de Clinical cytogeneticist.

## 2023-10-17 ENCOUNTER — Ambulatory Visit: Payer: Self-pay | Admitting: Physician Assistant

## 2023-10-24 ENCOUNTER — Encounter: Payer: Self-pay | Admitting: Physician Assistant

## 2023-10-24 ENCOUNTER — Other Ambulatory Visit (HOSPITAL_COMMUNITY)
Admission: RE | Admit: 2023-10-24 | Discharge: 2023-10-24 | Disposition: A | Discharge status: Home / Self Care | Payer: Self-pay | Source: Ambulatory Visit | Attending: Physician Assistant | Admitting: Physician Assistant

## 2023-10-24 ENCOUNTER — Ambulatory Visit: Payer: Self-pay | Admitting: Physician Assistant

## 2023-10-24 VITALS — BP 118/80 | HR 75 | Temp 99.3°F | Wt 150.5 lb

## 2023-10-24 DIAGNOSIS — Z01419 Encounter for gynecological examination (general) (routine) without abnormal findings: Secondary | ICD-10-CM

## 2023-10-24 DIAGNOSIS — Z23 Encounter for immunization: Secondary | ICD-10-CM

## 2023-10-24 DIAGNOSIS — Z789 Other specified health status: Secondary | ICD-10-CM

## 2023-10-24 NOTE — Progress Notes (Signed)
 BP 118/80   Pulse 75   Temp 99.3 F (37.4 C)   Wt 150 lb 8 oz (68.3 kg)   LMP 10/05/2023   SpO2 98%   BMI 27.98 kg/m    Subjective:    Patient ID: Michele Salas, female    DOB: 10/02/1980, 43 y.o.   MRN: [id number]  HPI: Michele Salas is a 43 y.o. female presenting on 10/24/2023 for No chief complaint on file.   HPI    Pt is 42yoF who is in today to update PAP.  She says she is feeling well and she has no complaints.    Relevant past medical, surgical, family and social history reviewed and updated as indicated. Interim medical history since our last visit reviewed. Allergies and medications reviewed and updated.   Current Outpatient Medications:    omeprazole (PRILOSEC) 40 MG capsule, Take 1 capsule (40 mg total) by mouth daily as needed., Disp: 30 capsule, Rfl: 6   Vitamin D, Ergocalciferol, (DRISDOL) 1.25 MG (50000 UNIT) CAPS capsule, Take 1 capsule (50,000 Units total) by mouth once a week., Disp: 16 capsule, Rfl: 3    Review of Systems  Per HPI unless specifically indicated above     Objective:    BP 118/80   Pulse 75   Temp 99.3 F (37.4 C)   Wt 150 lb 8 oz (68.3 kg)   LMP 10/05/2023   SpO2 98%   BMI 27.98 kg/m   Wt Readings from Last 3 Encounters:  10/24/23 150 lb 8 oz (68.3 kg)  09/29/23 151 lb 3.2 oz (68.6 kg)  09/22/23 150 lb (68 kg)    Physical Exam Vitals and nursing note reviewed. Exam conducted with a chaperone present.  Constitutional:      General: She is not in acute distress.    Appearance: She is well-developed. She is not toxic-appearing.  HENT:     Head: Normocephalic and atraumatic.  Pulmonary:     Effort: Pulmonary effort is normal. No respiratory distress.  Chest:  Breasts:    Right: No mass or tenderness.     Comments: Left reconstruction without abnormality Abdominal:     Palpations: Abdomen is soft. There is no mass.     Tenderness: There is no abdominal tenderness. There is no guarding or rebound.   Genitourinary:    Labia:        Right: No rash, tenderness or lesion.        Left: No rash, tenderness or lesion.      Vagina: Normal.     Cervix: No cervical motion tenderness, discharge or friability.     Uterus: Normal.      Adnexa:        Right: No mass, tenderness or fullness.         Left: No mass, tenderness or fullness.       Comments: (Nurse Berenice assisted) Skin:    General: Skin is warm and dry.  Neurological:     Mental Status: She is alert and oriented to person, place, and time.  Psychiatric:        Behavior: Behavior normal.           Assessment & Plan:    Encounter Diagnoses  Name Primary?   Encounter for routine gynecological examination with Papanicolaou smear of cervix Yes   Need for COVID-19 vaccine    Not proficient in English language      HCM -pap was updated today -pt covid vaccination is updated.  Nurse Manuella Ghazi  screened her, administered the vax and monitored for 15 minutes.  Pt was given VIS   History breast cancer -She has appt in May for mammogram. -she has f/u with oncology in may   Pt to follow up 6 months.  She is to contact office sooner prn

## 2023-10-27 LAB — CYTOLOGY - PAP
Adequacy: ABSENT
Comment: NEGATIVE
Diagnosis: NEGATIVE
High risk HPV: NEGATIVE

## 2023-12-11 ENCOUNTER — Encounter (HOSPITAL_COMMUNITY): Payer: Self-pay

## 2023-12-11 ENCOUNTER — Encounter: Payer: Self-pay | Admitting: Oncology

## 2023-12-11 ENCOUNTER — Inpatient Hospital Stay: Payer: Self-pay | Attending: Hematology

## 2023-12-11 ENCOUNTER — Ambulatory Visit (HOSPITAL_COMMUNITY)
Admission: RE | Admit: 2023-12-11 | Discharge: 2023-12-11 | Disposition: A | Discharge status: Home / Self Care | Payer: Self-pay | Source: Ambulatory Visit | Attending: Oncology | Admitting: Oncology

## 2023-12-11 DIAGNOSIS — E559 Vitamin D deficiency, unspecified: Secondary | ICD-10-CM | POA: Insufficient documentation

## 2023-12-11 DIAGNOSIS — D509 Iron deficiency anemia, unspecified: Secondary | ICD-10-CM | POA: Insufficient documentation

## 2023-12-11 DIAGNOSIS — C50112 Malignant neoplasm of central portion of left female breast: Secondary | ICD-10-CM | POA: Insufficient documentation

## 2023-12-11 DIAGNOSIS — D500 Iron deficiency anemia secondary to blood loss (chronic): Secondary | ICD-10-CM

## 2023-12-11 DIAGNOSIS — Z171 Estrogen receptor negative status [ER-]: Secondary | ICD-10-CM | POA: Insufficient documentation

## 2023-12-11 DIAGNOSIS — Z9882 Breast implant status: Secondary | ICD-10-CM | POA: Insufficient documentation

## 2023-12-11 DIAGNOSIS — Z9012 Acquired absence of left breast and nipple: Secondary | ICD-10-CM | POA: Insufficient documentation

## 2023-12-11 DIAGNOSIS — Z9221 Personal history of antineoplastic chemotherapy: Secondary | ICD-10-CM | POA: Insufficient documentation

## 2023-12-11 DIAGNOSIS — Z1231 Encounter for screening mammogram for malignant neoplasm of breast: Secondary | ICD-10-CM | POA: Insufficient documentation

## 2023-12-11 DIAGNOSIS — Z923 Personal history of irradiation: Secondary | ICD-10-CM | POA: Insufficient documentation

## 2023-12-11 LAB — CBC WITH DIFFERENTIAL/PLATELET
Abs Immature Granulocytes: 0.04 10*3/uL (ref 0.00–0.07)
Basophils Absolute: 0 10*3/uL (ref 0.0–0.1)
Basophils Relative: 1 %
Eosinophils Absolute: 0.3 10*3/uL (ref 0.0–0.5)
Eosinophils Relative: 4 %
HCT: 38.8 % (ref 36.0–46.0)
Hemoglobin: 12.7 g/dL (ref 12.0–15.0)
Immature Granulocytes: 1 %
Lymphocytes Relative: 29 %
Lymphs Abs: 2.1 10*3/uL (ref 0.7–4.0)
MCH: 26.8 pg (ref 26.0–34.0)
MCHC: 32.7 g/dL (ref 30.0–36.0)
MCV: 81.9 fL (ref 80.0–100.0)
Monocytes Absolute: 0.5 10*3/uL (ref 0.1–1.0)
Monocytes Relative: 6 %
Neutro Abs: 4.4 10*3/uL (ref 1.7–7.7)
Neutrophils Relative %: 59 %
Platelets: 362 10*3/uL (ref 150–400)
RBC: 4.74 MIL/uL (ref 3.87–5.11)
RDW: 20.3 % — ABNORMAL HIGH (ref 11.5–15.5)
WBC: 7.4 10*3/uL (ref 4.0–10.5)
nRBC: 0 % (ref 0.0–0.2)

## 2023-12-11 LAB — COMPREHENSIVE METABOLIC PANEL WITH GFR
ALT: 40 U/L (ref 0–44)
AST: 23 U/L (ref 15–41)
Albumin: 3.9 g/dL (ref 3.5–5.0)
Alkaline Phosphatase: 86 U/L (ref 38–126)
Anion gap: 10 (ref 5–15)
BUN: 10 mg/dL (ref 6–20)
CO2: 23 mmol/L (ref 22–32)
Calcium: 9 mg/dL (ref 8.9–10.3)
Chloride: 103 mmol/L (ref 98–111)
Creatinine, Ser: 0.52 mg/dL (ref 0.44–1.00)
GFR, Estimated: >60 mL/min (ref >60)
Glucose, Bld: 113 mg/dL — ABNORMAL HIGH (ref 70–99)
Potassium: 4.1 mmol/L (ref 3.5–5.1)
Sodium: 136 mmol/L (ref 135–145)
Total Bilirubin: 0.6 mg/dL (ref 0.0–1.2)
Total Protein: 7.2 g/dL (ref 6.5–8.1)

## 2023-12-11 LAB — FERRITIN: Ferritin: 16 ng/mL (ref 11–307)

## 2023-12-11 LAB — IRON AND TIBC
Iron: 38 ug/dL (ref 28–170)
Saturation Ratios: 8 % — ABNORMAL LOW (ref 10.4–31.8)
TIBC: 458 ug/dL — ABNORMAL HIGH (ref 250–450)
UIBC: 420 ug/dL

## 2023-12-24 NOTE — Progress Notes (Signed)
 [facility] - Closed [address] [place], [place] [address]   CLINIC:  Medical Oncology/Hematology  PCP:  Luane Rumps, PA-C [address] / [place] [address] [phone]   REASON FOR VISIT:    Follow-up for history of left breast triple negative breast cancer + iron  deficiency anemia  BRIEF ONCOLOGIC HISTORY:   Oncology History  Cancer of central portion of left female breast (HCC)  09/16/2015 Mammogram   1.6 cm retroareolar left breast mass measuring 1.6 cm in largest diameter in addition to 3 abnormal appearing left inferior axillary lymph nodes.   09/23/2015 Initial Biopsy   US  guided core biopsy of left breast mass and suspicious left axillary lymph nodes.   09/23/2015 Pathology Results   Invasive ductal carcinoma, high grade.  Left axilla lymph node is positive for metastatic carcinoma.  ER/PR NEGATIVE, HER2 NEGATIVE, KI-67 marker of 85%.   10/26/2015 Imaging   Bone scan- No evidence of metastatic disease to the skeleton.   10/27/2015 Imaging   CT CAP-  Retroareolar mass with 2 asymmetrically prominent left axillary lymph nodes and an upper normal size left subpectoral lymph node. No other findings of metastatic disease in the chest, abdomen, or pelvis.   11/05/2015 Echocardiogram   The estimated ejection   fraction was in the range of 60% to 65%.   11/23/2015 Miscellaneous   Genetic Counseling- genetic test result was negative for any known pathogenic mutations within any of 28 genes that would cause her to be at an increased genetic risk for breast, ovarian, or other related cancers.   12/09/2015 Surgery   L radical mastectomy, axillary LN dissection, port a cath insertion with Dr. Alanda Allegra 12/09/2015   12/11/2015 Pathology Results   - INVASIVE GRADE III DUCTAL CARCINOMA, SPANNING 2.3 CM IN GREATEST DIMENSION. - ASSOCIATED HIGH GRADE DCIS - MARGINS ARE NEGATIVE. 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 - EIGHT ADDITIONAL LYMPH  NODES WITH NO TUMOR SEEN   12/11/2015 Pathology Results   HER2 NEGATIVE, ER 0%, PR 0%, Ki-67 marker 90%   12/25/2015 - 02/12/2016 Chemotherapy   AC every 2 weeks x 4 cycles   02/04/2016 Treatment Plan Change   Treatment deferred x 7 days due to neutropenia   02/25/2016 - 05/12/2016 Chemotherapy   Paclitaxel  weekly x 12   06/01/2016 - 07/19/2016 Radiation Therapy   33 fractions, Dr. Jeryl Moris in [place]   02/06/2017 Surgery   (L) breast reconstruction (lat flap with tissue expander) with Dr. Marieta Shorten     CANCER STAGING:  Cancer Staging  Cancer of central portion of left female breast [facility]) Staging form: Breast, AJCC 7th Edition - Pathologic stage from 12/11/2015: Stage IIB (T2, N1a, cM0) - Signed by Doretta Gant, PA-C on 12/31/2015   INTERVAL HISTORY:   Michele Salas, a 43 y.o. female, returns for routine follow-up of her history of left breast triple negative breast cancer and iron  deficiency anemia. [name] was last seen on 09/22/2023 by NP Charlton Cooler.  She is accompanied today by her husband.  HISTORY OF BREAST CANCER:  This was addressed in detail at her last office visit with NP Charlton Cooler on 09/22/2023.   At today's visit, she does deny any symptoms of recurrence such as new lumps or adenopathy.   IRON  DEFICIENCY ANEMIA: She initially had some improved energy after her most recent IV iron  ( Venofer  300 mg x 3 in February/March 2025), but had recurrent fatigue about 2-3 weeks after IV iron .  She continues to experience heavy menstrual periods lasting 8 days with copious clotting.  She has never seen a gynecologist.  She denies any melena or rectal bleeding.  She reports pica and headaches.     She reports 50% energy and 100% appetite.  She is maintaining stable weight at this time.  ASSESSMENT & PLAN:  1.   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  She had a mammogram performed 09/16/2015 that showed a retroareolar  left breast mass measuring 1.6 cm with 3 abnormal left axillary lymph nodes noted. - She had a biopsy 09/23/2015 of the left breast mass as well as a left axillary lymph node. PATHOLOGY: Invasive ductal carcinoma high-grade left axillary lymph node was positive for metastatic cancer. ER-/PR-/HER2- Ki-67 was 85%. - Bone scan on 10/26/2015 that was negative. - CT CAP was done 10/27/2015 showed the left breast mass and left axillary lymph nodes with no other evidence of metastatic disease. - Dr. Larrie Po performed a left mastectomy and axillary lymph node dissection on 12/11/2015.  PATHOLOGY reported invasive ductal grade 3 measuring 2.3 cm.  With 2 out of 10 lymph nodes positive for malignancy. ER-/PR-/HER2- - She was treated with 4 cycles of AC followed by 12 weeks of Taxol  which was completed October 2017. - She was also treated with radiation 33 fractions which was completed in December 2017. - - - - - - - - - - - - - - - - - - - - - - - - - - - BREAST EXAM (per office visit with NP Charlton Cooler on 09/22/2023): Left breast implant site is within normal limits.  Right breast with palpable implant, no palpable masses.  No palpable adenopathy. - Most recent unilateral right breast mammogram (12/11/2023): BI-RADS Category 1, negative.  No mammographic evidence of malignancy in the right breast. - Most recent labs (12/11/2023): Normal CBC.  LFTs normal.  Previously noted to have transaminitis (peak ALT 78, peak AST 48).  Liver US  from May 2023 consistent with fatty liver disease. - PLAN: Breast exam every 6 to 12 months.  Next due around August 2026. - Check LFTs annually (next due May 2026) - Annual mammogram due May 2026.   2.  Iron  deficiency anemia: - She has history of menorrhagia.  No bright red blood per rectum or melena. - Iron  tablet discontinued due to severe constipation  - She has received intermittent IV iron , most recently with Venofer  300 mg x 3 in February/March 2025 - Most recent labs  (12/11/2022): Hgb 12.7/MCV 81.9, ferritin 16, iron  saturation 8%, with elevated TIBC 458 - Currently symptomatic with fatigue, pica, headaches - PLAN: Recommend IV iron  with Venofer  400 mg x2 - Referral to gynecology for menorrhagia - Repeat labs and RTC in 3 months   3.   Vitamin D  deficiency: - She is taking vitamin D  50,000 units weekly - Most recent vitamin D  (09/15/2023) is normal (40.49) - PLAN: Continue vitamin D  50,000 units weekly.   Recheck at follow-up in 6 months.  PLAN SUMMARY: >> Referral to GYN for menorrhagia >> IV iron  with Venofer  400 mg x2 >> Labs in 3 months = CBC/D, ferritin, iron /TIBC  >> OFFICE visit in 3 months (1 week after labs) - will need *gown* and exam at f/u visit    REVIEW OF SYSTEMS:   Review of Systems  Constitutional:  Positive for fatigue. Negative for appetite change, chills, diaphoresis, fever and unexpected weight  change.  HENT:   Negative for lump/mass and nosebleeds.   Eyes:  Negative for eye problems.  Respiratory:  Negative for cough, hemoptysis and shortness of breath.   Cardiovascular:  Negative for chest pain, leg swelling and palpitations.  Gastrointestinal:  Positive for constipation. Negative for abdominal pain, blood in stool, diarrhea, nausea and vomiting.  Genitourinary:  Negative for hematuria.   Skin: Negative.   Neurological:  Positive for headaches. Negative for dizziness, light-headedness and numbness.  Hematological:  Does not bruise/bleed easily.   PHYSICAL EXAM:   Performance status (ECOG): 1 - Symptomatic but completely ambulatory  Vitals:   12/25/23 1032  BP: 120/78  Pulse: 70  Resp: 18  Temp: 98 F (36.7 C)  SpO2: 98%   Wt Readings from Last 3 Encounters:  12/25/23 152 lb 4.8 oz (69.1 kg)  10/24/23 150 lb 8 oz (68.3 kg)  09/29/23 151 lb 3.2 oz (68.6 kg)   Physical Exam Constitutional:      Appearance: Normal appearance. She is normal weight.  Cardiovascular:     Heart sounds: Normal heart sounds.   Pulmonary:     Breath sounds: Normal breath sounds.  Neurological:     General: No focal deficit present.     Mental Status: Mental status is at baseline.  Psychiatric:        Behavior: Behavior normal. Behavior is cooperative.      PAST MEDICAL/SURGICAL HISTORY:  Past Medical History:  Diagnosis Date   History of breast cancer 10/2015   left   History of chemotherapy 2017   History of radiation therapy    PONV (postoperative nausea and vomiting)    nausea only   Past Surgical History:  Procedure Laterality Date   BREAST ENHANCEMENT SURGERY Right 05/19/2017   Procedure: Augmentation Right Breast with Saline Implant for symmetry;  Surgeon: Alger Infield, MD;  Location: [facility];  Service: Plastics;  Laterality: Right;   LATISSIMUS FLAP TO BREAST Left 02/06/2017   Procedure: LEFT LATISSIMUS FLAP TO BREAST;  Surgeon: Alger Infield, MD;  Location: [facility];  Service: Plastics;  Laterality: Left;   MASTECTOMY MODIFIED RADICAL Left 12/09/2015   Procedure: LEFT MODIFIED RADICAL MASTECTOMY;  Surgeon: Alanda Allegra, MD;  Location: [facility];  Service: General;  Laterality: Left;   MASTOPEXY Right 12/22/2017   Procedure: RIGHT MASTOPEXY;  Surgeon: Alger Infield, MD;  Location: [facility];  Service: Plastics;  Laterality: Right;   PORT-A-CATH REMOVAL Right 05/19/2017   Procedure: REMOVAL PORT-A-CATH;  Surgeon: Alger Infield, MD;  Location: [facility];  Service: Plastics;  Laterality: Right;   PORTACATH PLACEMENT Right 12/09/2015   Procedure: INSERTION PORT-A-CATH RIGHT SUBCLAVIAN;  Surgeon: Alanda Allegra, MD;  Location: [facility];  Service: General;  Laterality: Right;   REMOVAL OF TISSUE EXPANDER AND PLACEMENT OF IMPLANT Left 05/19/2017   Procedure: REMOVAL OF LEFT TISSUE EXPANDER AND PLACEMENT OF SALINE IMPLANT;  Surgeon: Alger Infield, MD;  Location: [facility];  Service: Plastics;  Laterality: Left;   SKIN FULL THICKNESS  GRAFT Left 12/22/2017   Procedure: LEFT NIPPLE AREOLAR COMPLEX RECONSTRUCTION WITH LOCAL FLAP  AND FULL THICKNESS SKIN GRAFT FROM LEFT GROIN;  Surgeon: Alger Infield, MD;  Location: [facility];  Service: Plastics;  Laterality: Left;   TISSUE EXPANDER PLACEMENT Left 02/06/2017   Procedure: TISSUE EXPANDER PLACEMENT LEFT BREAST;  Surgeon: Alger Infield, MD;  Location: [facility];  Service: Plastics;  Laterality: Left;    SOCIAL HISTORY:  Social History  Socioeconomic History   Marital status: Married    Spouse name: Not on file   Number of children: Not on file   Years of education: Not on file   Highest education level: Not on file  Occupational History   Not on file  Tobacco Use   Smoking status: Never   Smokeless tobacco: Never  Vaping Use   Vaping status: Never Used  Substance and Sexual Activity   Alcohol use: No   Drug use: No   Sexual activity: Yes    Birth control/protection: Condom  Other Topics Concern   Not on file  Social History Narrative   ** Merged History Encounter **       Social Drivers of Corporate investment banker Strain: Not on file  Food Insecurity: Not on file  Transportation Needs: Not on file  Physical Activity: Not on file  Stress: Not on file  Social Connections: Not on file  Intimate Partner Violence: Not on file    FAMILY HISTORY:  Family History  Problem Relation Age of Onset   Diabetes Mother    Heart attack Paternal Uncle    Heart attack Paternal Grandfather 53    CURRENT MEDICATIONS:  Current Outpatient Medications  Medication Sig Dispense Refill   omeprazole  (PRILOSEC) 40 MG capsule Take 1 capsule (40 mg total) by mouth daily as needed. 30 capsule 6   Vitamin D , Ergocalciferol , (DRISDOL ) 1.25 MG (50000 UNIT) CAPS capsule Take 1 capsule (50,000 Units total) by mouth once a week. 16 capsule 3   No current facility-administered medications for this visit.    ALLERGIES:  No Known Allergies  LABORATORY DATA:   I have reviewed the labs as listed.     Latest Ref Rng & Units 12/11/2023    9:49 AM 09/15/2023    8:12 AM 12/09/2022   10:20 AM  CBC  WBC 4.0 - 10.5 K/uL 7.4  7.4  5.7   Hemoglobin 12.0 - 15.0 g/dL 04.5  9.0  40.9   Hematocrit 36.0 - 46.0 % 38.8  29.9  39.2   Platelets 150 - 400 K/uL 362  429  320       Latest Ref Rng & Units 12/11/2023    9:49 AM 09/15/2023    8:12 AM 12/09/2022   10:20 AM  CMP  Glucose 70 - 99 mg/dL 811  914  782   BUN 6 - 20 mg/dL 10  10  11    Creatinine 0.44 - 1.00 mg/dL 9.56  2.13  0.86   Sodium 135 - 145 mmol/L 136  138  137   Potassium 3.5 - 5.1 mmol/L 4.1  4.0  3.8   Chloride 98 - 111 mmol/L 103  106  104   CO2 22 - 32 mmol/L 23  23  25    Calcium 8.9 - 10.3 mg/dL 9.0  9.0  8.9   Total Protein 6.5 - 8.1 g/dL 7.2  7.1  7.5   Total Bilirubin 0.0 - 1.2 mg/dL 0.6  0.6  0.9   Alkaline Phos 38 - 126 U/L 86  85  80   AST 15 - 41 U/L 23  23  21    ALT 0 - 44 U/L 40  26  29     DIAGNOSTIC IMAGING:  I have independently reviewed the scans and discussed with the patient. MM 3D SCREENING MAMMOGRAM UNILATERAL RIGHT BREAST W/IMPLANT Result Date: 12/13/2023 CLINICAL DATA:  Screening. Patient has had a left mastectomy. EXAM: DIGITAL SCREENING UNILATERAL RIGHT MAMMOGRAM  WITH IMPLANTS, CAD AND TOMOSYNTHESIS TECHNIQUE: Right screening digital craniocaudal and mediolateral oblique mammograms were obtained. Right screening digital breast tomosynthesis was performed. The images were evaluated with computer-aided detection. Standard and/or implant displaced views were performed. COMPARISON:  Previous exam(s). ACR Breast Density Category c: The breasts are heterogeneously dense, which may obscure small masses. FINDINGS: The patient has a right retropectoral saline implant. There are no findings suspicious for malignancy. IMPRESSION: No mammographic evidence of malignancy. A result letter of this screening mammogram will be mailed directly to the patient. RECOMMENDATION: Screening mammogram  in one year. (Code:SM-B-01Y) BI-RADS CATEGORY  1: Negative. Electronically Signed   By: Alger Infield M.D.   On: 12/13/2023 07:57     WRAP UP:  All questions were answered. The patient knows to call the clinic with any problems, questions or concerns.  Medical decision making: Moderate  Time spent on visit: I spent 20 minutes counseling the patient face to face. The total time spent in the appointment was 30 minutes and more than 50% was on counseling.  NOTE: Visit performed with assistance of certified Spanish language medical interpreter.    Sonnie Dusky, PA-C  12/25/23 11:45 AM

## 2023-12-25 ENCOUNTER — Inpatient Hospital Stay: Payer: Self-pay | Admitting: Physician Assistant

## 2023-12-25 VITALS — BP 120/78 | HR 70 | Temp 98.0°F | Resp 18 | Wt 152.3 lb

## 2023-12-25 DIAGNOSIS — D500 Iron deficiency anemia secondary to blood loss (chronic): Secondary | ICD-10-CM

## 2023-12-25 DIAGNOSIS — C50112 Malignant neoplasm of central portion of left female breast: Secondary | ICD-10-CM

## 2023-12-25 DIAGNOSIS — Z171 Estrogen receptor negative status [ER-]: Secondary | ICD-10-CM

## 2023-12-25 DIAGNOSIS — E559 Vitamin D deficiency, unspecified: Secondary | ICD-10-CM

## 2023-12-25 DIAGNOSIS — N920 Excessive and frequent menstruation with regular cycle: Secondary | ICD-10-CM

## 2023-12-25 NOTE — Patient Instructions (Signed)
 [facility] at [facility] Discharge Instructions  You were seen today by Sheril Dines PA-C for your follow-up visit.  PLAN SUMMARY: Referral to gynecologist to evaluate why you are having such heavy periods. Iron  infusions x 2. Labs and follow-up visit in 3 months.     IRON  DEFICIENCY ANEMIA: Your iron  deficiency anemia is caused by your heavy periods.  You will need to see a gynecologist to determine why you are having such heavy bleeding and to see if it can be stopped. - We will continue to treat your anemia: Iron  infusion x 2 Since your iron  pill is causing you severe constipation you can STOP taking your iron  pill.  HISTORY OF BREAST CANCER: - Your labs and last physical exam did not show any signs of recurrent breast cancer. - We will check a breast exam every 6 months.  This will be done at your next appointment in 3 months.  VITAMIN D  DEFICIENCY: I have sent a refill prescription to your pharmacy for your vitamin D  50,000 units each week.   ** Thank you for trusting me with your healthcare!  I strive to provide all of my patients with quality care at each visit.  If you receive a survey for this visit, I would be so grateful to you for taking the time to provide feedback.  Thank you in advance!  ~ [place]                   Dr. Paulett Boros   &   Sheril Dines, PA-C   - - - - - - - - - - - - - - - - - -     Thank you for choosing [facility] at [facility] to provide your oncology and hematology care.  To afford each patient quality time with our provider, please arrive at least 15 minutes before your scheduled appointment time.   If you have a lab appointment with the Cancer Center please come in thru the Main Entrance and check in at the main information desk.  You need to re-schedule your appointment should you arrive 10 or more minutes late.  We strive to give you quality time with our providers, and arriving  late affects you and other patients whose appointments are after yours.  Also, if you no show three or more times for appointments you may be dismissed from the clinic at the providers discretion.     Again, thank you for choosing [facility].  Our hope is that these requests will decrease the amount of time that you wait before being seen by our physicians.       _____________________________________________________________  Should you have questions after your visit to [facility], please contact our office at [phone] and follow the prompts.  Our office hours are 8:00 a.m. and 4:30 p.m. Monday - Friday.  Please note that voicemails left after 4:00 p.m. may not be returned until the following business day.  We are closed weekends and major holidays.  You do have access to a nurse 24-7, just call the main number to the clinic [phone] and do not press any options, hold on the line and a nurse will answer the phone.    For prescription refill requests, have your pharmacy contact our office and allow 72 hours.    Due to Covid, you will need to wear a mask upon entering the hospital. If you do  not have a mask, a mask will be given to you at the Main Entrance upon arrival. For doctor visits, patients may have 1 support person age 108 or older with them. For treatment visits, patients can not have anyone with them due to social distancing guidelines and our immunocompromised population.

## 2024-01-05 ENCOUNTER — Inpatient Hospital Stay: Payer: Self-pay

## 2024-01-05 VITALS — BP 135/83 | HR 68 | Temp 97.9°F | Resp 18

## 2024-01-05 DIAGNOSIS — D509 Iron deficiency anemia, unspecified: Secondary | ICD-10-CM

## 2024-01-05 MED ORDER — CETIRIZINE HCL 10 MG PO TABS
10.0000 mg | ORAL_TABLET | Freq: Once | ORAL | Status: AC
  Administered 2024-01-05: 10 mg via ORAL
  Filled 2024-01-05: qty 1

## 2024-01-05 MED ORDER — SODIUM CHLORIDE 0.9 % IV SOLN: INTRAVENOUS | Status: DC

## 2024-01-05 MED ORDER — SODIUM CHLORIDE 0.9 % IV SOLN
400.0000 mg | Freq: Once | INTRAVENOUS | Status: AC
  Administered 2024-01-05: 400 mg via INTRAVENOUS
  Filled 2024-01-05: qty 400

## 2024-01-05 MED ORDER — ACETAMINOPHEN 325 MG PO TABS
650.0000 mg | ORAL_TABLET | Freq: Once | ORAL | Status: AC
  Administered 2024-01-05: 650 mg via ORAL
  Filled 2024-01-05: qty 2

## 2024-01-05 NOTE — Progress Notes (Signed)
 Patient presents today for iron infusion.  Patient is in satisfactory condition with no new complaints voiced.  Vital signs are stable.  We will proceed with infusion per provider orders.    Peripheral IV started with good blood return pre and post infusion.  Venofer 400 mg given today per MD orders. Tolerated infusion without adverse affects. Vital signs stable. No complaints at this time. Discharged from clinic ambulatory in stable condition. Alert and oriented x 3. F/U with [facility] as scheduled.

## 2024-01-05 NOTE — Patient Instructions (Signed)
 Instrucciones al darle de alta: Discharge Instructions Gracias por elegir al [facility] de Cncer de [place] para brindarle atencin mdica de oncologa y Teacher, English as a foreign language.  Si usted tiene cita de laboratorio con el Centro de Cncer, por favor entre por la puerta principal y regstrese en el escritorio central de informacin.   Use ropa cmoda y [place] para tener fcil acceso a las vas del Portacath (acceso venoso de larga duracin) o la lnea PICC (catter central colocado por va perifrica).   Nos esforzamos por ofrecerle tiempo de calidad con su proveedor. Es posible que tenga que volver a programar su cita si llega tarde (15 minutos o ms).  El llegar tarde le afecta a usted y a otros pacientes cuyas citas son posteriores a Armed forces operational officer.  Adems, si usted falta a tres o ms citas sin avisar a la oficina, puede ser retirado(a) de la clnica a discrecin del proveedor.      Para las solicitudes de renovacin de recetas, pida a su farmacia que se ponga en contacto con nuestra oficina y deje que transcurran 72 horas para que se complete el proceso de las renovaciones.    Hoy usted recibi los siguientes agentes de quimioterapia e/o inmunoterapia Venofer  400 mg      Para ayudar a [employer] nuseas y los vmitos despus de su tratamiento, le recomendamos que tome su medicamento para las nuseas segn las indicaciones.  LOS SNTOMAS QUE DEBEN COMUNICARSE INMEDIATAMENTE SE INDICAN A CONTINUACIN: *FIEBRE SUPERIOR A 100.4 F (38 C) O MS *ESCALOFROS O SUDORACIN *NUSEAS Y VMITOS QUE NO SE CONTROLAN CON EL MEDICAMENTO PARA LAS NUSEAS *DIFICULTAD INUSUAL PARA RESPIRAR  *MORETONES O HEMORRAGIAS NO HABITUALES *PROBLEMAS URINARIOS (dolor o ardor al Geographical information systems officer o frecuencia para Geographical information systems officer) *PROBLEMAS INTESTINALES (diarrea inusual, estreimiento, dolor cerca del ano) SENSIBILIDAD EN LA BOCA Y EN LA GARGANTA CON O SIN LA PRESENCIA DE LCERAS (dolor de garganta, llagas en la boca o dolor de  muelas/dientes) ERUPCIN, HINCHAZN O DOLORES INUSUALES FLUJO VAGINAL INUSUAL O PICAZN/RASQUIA    Los puntos marcados con un asterisco ( *) indican una posible emergencia y debe hacer un seguimiento tan pronto como le sea posible o vaya al Departamento de Emergencias si se le presenta algn problema.  Por favor, muestre la [place] DE ADVERTENCIA DE Georgie Kiss DE ADVERTENCIA DE Annie Barton al registrarse en [address] de Emergencias y a la enfermera de triaje.  Si tiene preguntas despus de su visita o necesita cancelar o volver a programar su cita, por favor pngase en contacto con [facility] OF [facility] [phone]  y siga las instrucciones. Las horas de oficina son de 8:00 a.m. a 4:30 p.m. de lunes a viernes. Por favor, tenga en cuenta que los mensajes de voz que se dejan despus de las 4:00 p.m. posiblemente no se devolvern hasta el siguiente da de trabajo.  Cerramos los fines de semana y [employer]. En todo momento tiene acceso a una enfermera para preguntas urgentes. Por favor, llame al nmero principal de la clnica [phone] y siga las instrucciones.   Para cualquier pregunta que no sea de carcter urgente, tambin puede ponerse en contacto con su proveedor utilizando MyChart. Ahora ofrecemos visitas electrnicas para cualquier persona mayor de 18 aos que solicite atencin mdica en lnea para los sntomas que no sean urgentes. Para ms detalles vaya a [email or web address].   Tambin puede bajar la aplicacin de MyChart! Vaya a la tienda de aplicaciones,  busque "MyChart", abra la aplicacin, seleccione [place], e ingrese con su nombre de usuario y la contrasea de Clinical cytogeneticist.

## 2024-01-12 ENCOUNTER — Inpatient Hospital Stay: Payer: Self-pay | Attending: Hematology

## 2024-01-12 VITALS — BP 151/76 | HR 72 | Temp 97.3°F | Resp 18

## 2024-01-12 DIAGNOSIS — D509 Iron deficiency anemia, unspecified: Secondary | ICD-10-CM

## 2024-01-12 DIAGNOSIS — N920 Excessive and frequent menstruation with regular cycle: Secondary | ICD-10-CM | POA: Insufficient documentation

## 2024-01-12 DIAGNOSIS — D500 Iron deficiency anemia secondary to blood loss (chronic): Secondary | ICD-10-CM | POA: Insufficient documentation

## 2024-01-12 MED ORDER — SODIUM CHLORIDE 0.9 % IV SOLN: INTRAVENOUS | Status: DC

## 2024-01-12 MED ORDER — SODIUM CHLORIDE 0.9 % IV SOLN
400.0000 mg | Freq: Once | INTRAVENOUS | Status: AC
  Administered 2024-01-12: 400 mg via INTRAVENOUS
  Filled 2024-01-12: qty 400

## 2024-01-12 NOTE — Progress Notes (Signed)
 Patient took own premeds from home at 0800.  Tylenol  and zyrtec .   Patient tolerated iron  infusion with no complaints voiced.  Peripheral IV site clean and dry with good blood return noted before and after infusion.  Band aid applied.  VSS with discharge and left in satisfactory condition with no s/s of distress noted.

## 2024-01-12 NOTE — Patient Instructions (Signed)
 Instrucciones al darle de alta: Discharge Instructions Gracias por elegir al [facility] de Cncer de [place] para brindarle atencin mdica de oncologa y Teacher, English as a foreign language.  Si usted tiene cita de laboratorio con [employer] de [place], por favor entre por la puerta principal y regstrese en el escritorio central de informacin.   Use ropa cmoda y [place] para tener fcil acceso a las vas del Portacath (acceso venoso de Set designer duracin) o la lnea PICC (catter central colocado por va perifrica).   Nos esforzamos por ofrecerle tiempo de calidad con su proveedor. Es posible que tenga que volver a programar su cita si llega tarde (15 minutos o ms).  El llegar tarde le afecta a usted y a otros pacientes cuyas citas son posteriores a Armed forces operational officer.  Adems, si usted falta a tres o ms citas sin avisar a la oficina, puede ser retirado(a) de la clnica a discrecin del proveedor.      Para las solicitudes de renovacin de recetas, pida a su farmacia que se ponga en contacto con nuestra oficina y deje que transcurran 72 horas para que se complete el proceso de las renovaciones.      Para ayudar a prevenir las nuseas y los vmitos despus de su tratamiento, le recomendamos que tome su medicamento para las nuseas segn las indicaciones.  LOS SNTOMAS QUE DEBEN COMUNICARSE INMEDIATAMENTE SE INDICAN A CONTINUACIN: *FIEBRE SUPERIOR A 100.4 F (38 C) O MS *ESCALOFROS O SUDORACIN *NUSEAS Y VMITOS QUE NO SE CONTROLAN CON EL MEDICAMENTO PARA LAS NUSEAS *DIFICULTAD INUSUAL PARA RESPIRAR  *MORETONES O HEMORRAGIAS NO HABITUALES *PROBLEMAS URINARIOS (dolor o ardor al Geographical information systems officer o frecuencia para Geographical information systems officer) *PROBLEMAS INTESTINALES (diarrea inusual, estreimiento, dolor cerca del ano) SENSIBILIDAD EN LA BOCA Y EN LA GARGANTA CON O SIN LA PRESENCIA DE LCERAS (dolor de garganta, llagas en la boca o dolor de muelas/dientes) ERUPCIN, HINCHAZN O DOLORES INUSUALES FLUJO VAGINAL INUSUAL O PICAZN/RASQUIA    Los puntos  marcados con un asterisco ( *) indican una posible emergencia y debe hacer un seguimiento tan pronto como le sea posible o vaya al Departamento de Emergencias si se le presenta algn problema.  Por favor, muestre la [place] DE ADVERTENCIA DE Marc Morgans DE ADVERTENCIA DE Gardiner Fanti al registrarse en [address] de Emergencias y a la enfermera de triaje.  Si tiene preguntas despus de su visita o necesita cancelar o volver a programar su cita, por favor pngase en contacto con [facility] - A DEPT OF Eligha [facility] [phone]  y siga las instrucciones. Las horas de oficina son de 8:00 a.m. a 4:30 p.m. de lunes a viernes. Por favor, tenga en cuenta que los mensajes de voz que se dejan despus de las 4:00 p.m. posiblemente no se devolvern hasta el siguiente da de [place].  Cerramos los fines de semana y [employer]. En todo momento tiene acceso a una enfermera para preguntas urgentes. Por favor, llame al nmero principal de la clnica [phone] y siga las instrucciones.   Para cualquier pregunta que no sea de carcter urgente, tambin puede ponerse en contacto con su proveedor [employer]. Ahora ofrecemos visitas electrnicas para cualquier persona mayor de 18 aos que solicite atencin mdica en lnea para los sntomas que no sean urgentes. Para ms detalles vaya a [email or web address].   Tambin puede bajar la aplicacin de MyChart! Vaya a la tienda de aplicaciones, busque "MyChart", abra la aplicacin, seleccione [place], e ingrese con su nombre de usuario y  la contrasea de MyChart.

## 2024-03-25 ENCOUNTER — Inpatient Hospital Stay: Payer: Self-pay | Attending: Hematology

## 2024-03-25 DIAGNOSIS — N920 Excessive and frequent menstruation with regular cycle: Secondary | ICD-10-CM | POA: Insufficient documentation

## 2024-03-25 DIAGNOSIS — Z853 Personal history of malignant neoplasm of breast: Secondary | ICD-10-CM | POA: Insufficient documentation

## 2024-03-25 DIAGNOSIS — Z9012 Acquired absence of left breast and nipple: Secondary | ICD-10-CM | POA: Insufficient documentation

## 2024-03-25 DIAGNOSIS — D500 Iron deficiency anemia secondary to blood loss (chronic): Secondary | ICD-10-CM | POA: Insufficient documentation

## 2024-03-25 DIAGNOSIS — E559 Vitamin D deficiency, unspecified: Secondary | ICD-10-CM | POA: Insufficient documentation

## 2024-03-25 LAB — CBC WITH DIFFERENTIAL/PLATELET
Abs Immature Granulocytes: 0.04 K/uL (ref 0.00–0.07)
Basophils Absolute: 0.1 K/uL (ref 0.0–0.1)
Basophils Relative: 1 %
Eosinophils Absolute: 0.2 K/uL (ref 0.0–0.5)
Eosinophils Relative: 3 %
HCT: 37.4 % (ref 36.0–46.0)
Hemoglobin: 12.7 g/dL (ref 12.0–15.0)
Immature Granulocytes: 1 %
Lymphocytes Relative: 32 %
Lymphs Abs: 2.1 K/uL (ref 0.7–4.0)
MCH: 30.3 pg (ref 26.0–34.0)
MCHC: 34 g/dL (ref 30.0–36.0)
MCV: 89.3 fL (ref 80.0–100.0)
Monocytes Absolute: 0.5 K/uL (ref 0.1–1.0)
Monocytes Relative: 8 %
Neutro Abs: 3.6 K/uL (ref 1.7–7.7)
Neutrophils Relative %: 55 %
Platelets: 310 K/uL (ref 150–400)
RBC: 4.19 MIL/uL (ref 3.87–5.11)
RDW: 13.9 % (ref 11.5–15.5)
WBC: 6.5 K/uL (ref 4.0–10.5)
nRBC: 0 % (ref 0.0–0.2)

## 2024-03-25 LAB — IRON AND TIBC
Iron: 48 ug/dL (ref 28–170)
Saturation Ratios: 11 % (ref 10.4–31.8)
TIBC: 421 ug/dL (ref 250–450)
UIBC: 373 ug/dL

## 2024-03-25 LAB — FERRITIN: Ferritin: 32 ng/mL (ref 11–307)

## 2024-03-29 NOTE — Progress Notes (Signed)
 [facility] [address], [place] [address]   CLINIC:  Medical Oncology/Hematology  PCP:  Comer Kirsch, PA-C [address] / [place] [address] [phone]   REASON FOR VISIT:    Follow-up for history of left breast triple negative breast cancer + iron  deficiency anemia  BRIEF ONCOLOGIC HISTORY:   Oncology History  Cancer of central portion of left female breast (HCC)  09/16/2015 Mammogram   1.6 cm retroareolar left breast mass measuring 1.6 cm in largest diameter in addition to 3 abnormal appearing left inferior axillary lymph nodes.   09/23/2015 Initial Biopsy   US  guided core biopsy of left breast mass and suspicious left axillary lymph nodes.   09/23/2015 Pathology Results   Invasive ductal carcinoma, high grade.  Left axilla lymph node is positive for metastatic carcinoma.  ER/PR NEGATIVE, HER2 NEGATIVE, KI-67 marker of 85%.   10/26/2015 Imaging   Bone scan- No evidence of metastatic disease to the skeleton.   10/27/2015 Imaging   CT CAP-  Retroareolar mass with 2 asymmetrically prominent left axillary lymph nodes and an upper normal size left subpectoral lymph node. No other findings of metastatic disease in the chest, abdomen, or pelvis.   11/05/2015 Echocardiogram   The estimated ejection   fraction was in the range of 60% to 65%.   11/23/2015 Miscellaneous   Genetic Counseling- genetic test result was negative for any known pathogenic mutations within any of 28 genes that would cause her to be at an increased genetic risk for breast, ovarian, or other related cancers.   12/09/2015 Surgery   L radical mastectomy, axillary LN dissection, port a cath insertion with Dr. Oneil Budge 12/09/2015   12/11/2015 Pathology Results   - INVASIVE GRADE III DUCTAL CARCINOMA, SPANNING 2.3 CM IN GREATEST DIMENSION. - ASSOCIATED HIGH GRADE DCIS - MARGINS ARE NEGATIVE. 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 - EIGHT ADDITIONAL LYMPH  NODES WITH NO TUMOR SEEN   12/11/2015 Pathology Results   HER2 NEGATIVE, ER 0%, PR 0%, Ki-67 marker 90%   12/25/2015 - 02/12/2016 Chemotherapy   AC every 2 weeks x 4 cycles   02/04/2016 Treatment Plan Change   Treatment deferred x 7 days due to neutropenia   02/25/2016 - 05/12/2016 Chemotherapy   Paclitaxel  weekly x 12   06/01/2016 - 07/19/2016 Radiation Therapy   33 fractions, Dr. Dewey in [place]   02/06/2017 Surgery   (L) breast reconstruction (lat flap with tissue expander) with Dr. Arelia     CANCER STAGING:  Cancer Staging  Cancer of central portion of left female breast [facility]) Staging form: Breast, AJCC 7th Edition - Pathologic stage from 12/11/2015: Stage IIB (T2, N1a, cM0) - Signed by Berry Debby RAMAN, PA-C on 12/31/2015   INTERVAL HISTORY:   [name], a 43 y.o. female, returns for routine follow-up of her history of left breast triple negative breast cancer and iron  deficiency anemia. Michele Salas was last seen on 12/25/2023 by Pleasant Barefoot PA-C.  HISTORY OF BREAST CANCER:   She denies any new breast lumps or axillary lymphadenopathy. No new onset bone pain, chest pain, dyspnea, or abdominal pain. She denies any seizures or focal neurologic deficits. No B symptoms such as fever, chills, night sweats, unintentional weight loss.  She does have nocturnal hot flashes.   IRON  DEFICIENCY ANEMIA: She initially had some improved energy after her most recent IV iron  (Venofer  400 mg x 2 in May/June 2025), but had recurrent fatigue about a  month after IV iron .   She continues to experience heavy menstrual periods lasting 8 days with copious clotting. She denies any melena or rectal bleeding.  She reports pica and headaches.   She reports little to no energy and 100% appetite.   She is maintaining stable weight at this time.  ASSESSMENT & PLAN:  1.   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  She had a mammogram  performed 09/16/2015 that showed a retroareolar left breast mass measuring 1.6 cm with 3 abnormal left axillary lymph nodes noted. - She had a biopsy 09/23/2015 of the left breast mass as well as a left axillary lymph node. PATHOLOGY: Invasive ductal carcinoma high-grade left axillary lymph node was positive for metastatic cancer. ER-/PR-/HER2- Ki-67 was 85%. - Bone scan on 10/26/2015 that was negative. - CT CAP was done 10/27/2015 showed the left breast mass and left axillary lymph nodes with no other evidence of metastatic disease. - Dr. Mavis performed a left mastectomy and axillary lymph node dissection on 12/11/2015.  PATHOLOGY reported invasive ductal grade 3 measuring 2.3 cm.  With 2 out of 10 lymph nodes positive for malignancy. ER-/PR-/HER2- - She was treated with 4 cycles of AC followed by 12 weeks of Taxol  which was completed October 2017. - She was also treated with radiation 33 fractions which was completed in December 2017. - - - - - - - - - - - - - - - - - - - - - - - - - - - BREAST EXAM 04/01/24): Left breast implant site is within normal limits.  Right breast with palpable implant, no palpable masses.  No palpable adenopathy. - Most recent unilateral right breast mammogram (12/11/2023): BI-RADS Category 1, negative.  No mammographic evidence of malignancy in the right breast. - Most recent labs (12/11/2023): Normal CBC.  LFTs normal.  Previously noted to have transaminitis (peak ALT 78, peak AST 48).  Liver US  from May 2023 consistent with fatty liver disease. - PLAN: Breast exam every 6 to 12 months.  Next due around February - August 2026.  - Check LFTs annually (next due May 2026) - Annual mammogram due May 2026.   2.  Iron  deficiency anemia: - She has history of menorrhagia.  No bright red blood per rectum or melena. - Iron  tablet discontinued due to severe constipation  - She has received intermittent IV iron , most recently with Venofer  400 mg x 2 May/June 2025 - Most recent labs  (03/25/2024): Hgb 12.7/MCV 89.3, ferritin 32, iron  saturation 11% - Currently symptomatic with fatigue, pica, headaches - PLAN: Recommend IV iron  with Venofer  400 mg x2 - Referral to gynecology for menorrhagia  - Repeat labs and RTC in 3 months   3.   Vitamin D  deficiency: - She is taking vitamin D  50,000 units weekly - Most recent vitamin D  (09/15/2023) is normal (40.49) - PLAN: Continue vitamin D  50,000 units weekly.   Recheck annually (February 2026)  PLAN SUMMARY:  >> Referral to GYN for menorrhagia   >> IV iron  with Venofer  400 mg x2 >> Labs in 3 months = CBC/D, ferritin, iron /TIBC  >> OFFICE visit in 3 months (1 week after labs)    REVIEW OF SYSTEMS:   Review of Systems  Constitutional:  Positive for fatigue. Negative for appetite change, chills, diaphoresis, fever and unexpected weight change.  HENT:   Negative for lump/mass and nosebleeds.   Eyes:  Negative for eye problems.  Respiratory:  Negative for cough, hemoptysis and shortness of breath.   Cardiovascular:  Negative for chest pain, leg swelling and palpitations.  Gastrointestinal:  Positive for constipation. Negative for abdominal pain, blood in stool, diarrhea, nausea and vomiting.  Genitourinary:  Negative for hematuria.   Skin: Negative.   Neurological:  Positive for headaches. Negative for dizziness, light-headedness and numbness.  Hematological:  Does not bruise/bleed easily.   PHYSICAL EXAM:   Performance status (ECOG): 1 - Symptomatic but completely ambulatory  Vitals:   04/01/24 0849  BP: 132/86  Pulse: 69  Resp: 18  Temp: 98 F (36.7 C)  SpO2: 95%    Wt Readings from Last 3 Encounters:  04/01/24 152 lb 14.4 oz (69.4 kg)  12/25/23 152 lb 4.8 oz (69.1 kg)  10/24/23 150 lb 8 oz (68.3 kg)   Physical Exam Constitutional:      Appearance: Normal appearance.  Cardiovascular:     Rate and Rhythm: Normal rate and regular rhythm.  Pulmonary:     Effort: Pulmonary effort is normal.     Breath sounds:  Normal breath sounds.  Chest:     Comments: Left breast mastectomy site s/p implant is within normal limits. Right breast with palpable implant; no palpable masses or nodules within the remaining breast tissue. No palpable lymphadenopathy. Lymphadenopathy:     Upper Body:     Right upper body: No supraclavicular, axillary or pectoral adenopathy.     Left upper body: No supraclavicular, axillary or pectoral adenopathy.  Skin:    General: Skin is warm and dry.  Neurological:     General: No focal deficit present.     Mental Status: She is alert and oriented to person, place, and time.  Psychiatric:        Mood and Affect: Mood normal.        Behavior: Behavior normal.      PAST MEDICAL/SURGICAL HISTORY:  Past Medical History:  Diagnosis Date   History of breast cancer 10/2015   left   History of chemotherapy 2017   History of radiation therapy    PONV (postoperative nausea and vomiting)    nausea only   Past Surgical History:  Procedure Laterality Date   BREAST ENHANCEMENT SURGERY Right 05/19/2017   Procedure: Augmentation Right Breast with Saline Implant for symmetry;  Surgeon: Arelia Filippo, MD;  Location: [facility];  Service: Plastics;  Laterality: Right;   LATISSIMUS FLAP TO BREAST Left 02/06/2017   Procedure: LEFT LATISSIMUS FLAP TO BREAST;  Surgeon: Arelia Filippo, MD;  Location: [facility];  Service: Plastics;  Laterality: Left;   MASTECTOMY MODIFIED RADICAL Left 12/09/2015   Procedure: LEFT MODIFIED RADICAL MASTECTOMY;  Surgeon: Oneil Budge, MD;  Location: [facility];  Service: General;  Laterality: Left;   MASTOPEXY Right 12/22/2017   Procedure: RIGHT MASTOPEXY;  Surgeon: Arelia Filippo, MD;  Location: [facility];  Service: Plastics;  Laterality: Right;   PORT-A-CATH REMOVAL Right 05/19/2017   Procedure: REMOVAL PORT-A-CATH;  Surgeon: Arelia Filippo, MD;  Location: [facility];  Service: Plastics;  Laterality: Right;    PORTACATH PLACEMENT Right 12/09/2015   Procedure: INSERTION PORT-A-CATH RIGHT SUBCLAVIAN;  Surgeon: Oneil Budge, MD;  Location: [facility];  Service: General;  Laterality: Right;   REMOVAL OF TISSUE EXPANDER AND PLACEMENT OF IMPLANT Left 05/19/2017   Procedure: REMOVAL OF LEFT TISSUE EXPANDER AND PLACEMENT OF SALINE IMPLANT;  Surgeon: Arelia Filippo, MD;  Location: [facility];  Service: Plastics;  Laterality: Left;  SKIN FULL THICKNESS GRAFT Left 12/22/2017   Procedure: LEFT NIPPLE AREOLAR COMPLEX RECONSTRUCTION WITH LOCAL FLAP  AND FULL THICKNESS SKIN GRAFT FROM LEFT GROIN;  Surgeon: Arelia Filippo, MD;  Location: [facility];  Service: Plastics;  Laterality: Left;   TISSUE EXPANDER PLACEMENT Left 02/06/2017   Procedure: TISSUE EXPANDER PLACEMENT LEFT BREAST;  Surgeon: Arelia Filippo, MD;  Location: [facility];  Service: Plastics;  Laterality: Left;    SOCIAL HISTORY:  Social History   Socioeconomic History   Marital status: Married    Spouse name: Not on file   Number of children: Not on file   Years of education: Not on file   Highest education level: Not on file  Occupational History   Not on file  Tobacco Use   Smoking status: Never   Smokeless tobacco: Never  Vaping Use   Vaping status: Never Used  Substance and Sexual Activity   Alcohol use: No   Drug use: No   Sexual activity: Yes    Birth control/protection: Condom  Other Topics Concern   Not on file  Social History Narrative   ** Merged History Encounter **       Social Drivers of Corporate investment banker Strain: Not on file  Food Insecurity: Not on file  Transportation Needs: Not on file  Physical Activity: Not on file  Stress: Not on file  Social Connections: Not on file  Intimate Partner Violence: Not on file    FAMILY HISTORY:  Family History  Problem Relation Age of Onset   Diabetes Mother    Heart attack Paternal Uncle    Heart attack Paternal Grandfather 31     CURRENT MEDICATIONS:  Current Outpatient Medications  Medication Sig Dispense Refill   omeprazole  (PRILOSEC) 40 MG capsule Take 1 capsule (40 mg total) by mouth daily as needed. 30 capsule 6   Vitamin D , Ergocalciferol , (DRISDOL ) 1.25 MG (50000 UNIT) CAPS capsule Take 1 capsule (50,000 Units total) by mouth once a week. 16 capsule 3   No current facility-administered medications for this visit.    ALLERGIES:  No Known Allergies  LABORATORY DATA:  I have reviewed the labs as listed.     Latest Ref Rng & Units 03/25/2024    7:56 AM 12/11/2023    9:49 AM 09/15/2023    8:12 AM  CBC  WBC 4.0 - 10.5 K/uL 6.5  7.4  7.4   Hemoglobin 12.0 - 15.0 g/dL 87.2  87.2  9.0   Hematocrit 36.0 - 46.0 % 37.4  38.8  29.9   Platelets 150 - 400 K/uL 310  362  429       Latest Ref Rng & Units 12/11/2023    9:49 AM 09/15/2023    8:12 AM 12/09/2022   10:20 AM  CMP  Glucose 70 - 99 mg/dL 886  888  884   BUN 6 - 20 mg/dL 10  10  11    Creatinine 0.44 - 1.00 mg/dL 9.47  9.47  9.49   Sodium 135 - 145 mmol/L 136  138  137   Potassium 3.5 - 5.1 mmol/L 4.1  4.0  3.8   Chloride 98 - 111 mmol/L 103  106  104   CO2 22 - 32 mmol/L 23  23  25    Calcium 8.9 - 10.3 mg/dL 9.0  9.0  8.9   Total Protein 6.5 - 8.1 g/dL 7.2  7.1  7.5   Total Bilirubin 0.0 - 1.2 mg/dL 0.6  0.6  0.9   Alkaline Phos 38 - 126 U/L 86  85  80   AST 15 - 41 U/L 23  23  21    ALT 0 - 44 U/L 40  26  29     DIAGNOSTIC IMAGING:  I have independently reviewed the scans and discussed with the patient. No results found.    WRAP UP:  All questions were answered. The patient knows to call the clinic with any problems, questions or concerns.  Medical decision making: Moderate  Time spent on visit: I spent 20 minutes counseling the patient face to face. The total time spent in the appointment was 30 minutes and more than 50% was on counseling.  NOTE: Visit performed with assistance of certified Spanish language medical interpreter.    Pleasant CHRISTELLA Barefoot, PA-C  04/01/24 9:47 AM

## 2024-04-01 ENCOUNTER — Inpatient Hospital Stay (HOSPITAL_BASED_OUTPATIENT_CLINIC_OR_DEPARTMENT_OTHER): Payer: Self-pay | Admitting: Physician Assistant

## 2024-04-01 VITALS — BP 132/86 | HR 69 | Temp 98.0°F | Resp 18 | Wt 152.9 lb

## 2024-04-01 DIAGNOSIS — N920 Excessive and frequent menstruation with regular cycle: Secondary | ICD-10-CM

## 2024-04-01 DIAGNOSIS — D500 Iron deficiency anemia secondary to blood loss (chronic): Secondary | ICD-10-CM

## 2024-04-01 NOTE — Patient Instructions (Signed)
 [facility] Discharge Instructions  You were seen today by Pleasant Barefoot PA-C for your follow-up visit.  PLAN SUMMARY: Referral to gynecologist to evaluate why you are having such heavy periods. Iron  infusions x 2. Labs and follow-up visit in 3 months.     IRON  DEFICIENCY ANEMIA: Your iron  deficiency anemia is caused by your heavy periods.  You will need to see a gynecologist to determine why you are having such heavy bleeding and to see if it can be stopped. - We will continue to treat your anemia: Iron  infusion x 2 Since your iron  pill is causing you severe constipation you can STOP taking your iron  pill.  HISTORY OF BREAST CANCER: - Your labs and last physical exam did not show any signs of recurrent breast cancer. - We will check a breast exam every 6 months.    VITAMIN D  DEFICIENCY: I have sent a refill prescription to your pharmacy for your vitamin D  50,000 units each week.    ** Thank you for trusting me with your healthcare!  I strive to provide all of my patients with quality care at each visit.  If you receive a survey for this visit, I would be so grateful to you for taking the time to provide feedback.  Thank you in advance!  ~ Nandini Bogdanski                                        Dr. Mickiel Dry       Pleasant Barefoot, PA-C        Delon Hope, NP   - - - - - - - - - - - - - - - - - -     - - - - - - - - - - - - - - - - - -     Thank you for choosing [facility] at [facility] to provide your oncology and hematology care.  To afford each patient quality time with our provider, please arrive at least 15 minutes before your scheduled appointment time.   If you have a lab appointment with the Cancer Center please come in thru the Main Entrance and check in at the main information desk.  You need to re-schedule your appointment should you arrive 10 or more minutes late.  We strive to give you quality time  with our providers, and arriving late affects you and other patients whose appointments are after yours.  Also, if you no show three or more times for appointments you may be dismissed from the clinic at the providers discretion.     Again, thank you for choosing [facility].  Our hope is that these requests will decrease the amount of time that you wait before being seen by our physicians.       _____________________________________________________________  Should you have questions after your visit to [facility], please contact our office at [phone] and follow the prompts.  Our office hours are 8:00 a.m. and 4:30 p.m. Monday - Friday.  Please note that voicemails left after 4:00 p.m. may not be returned until the following business day.  We are closed weekends and major holidays.  You do have access to a nurse 24-7, just call the main number to the clinic [phone] and do not press any options, hold on the  line and a nurse will answer the phone.    For prescription refill requests, have your pharmacy contact our office and allow 72 hours.    Due to Covid, you will need to wear a mask upon entering the hospital. If you do not have a mask, a mask will be given to you at the Main Entrance upon arrival. For doctor visits, patients may have 1 support person age 35 or older with them. For treatment visits, patients can not have anyone with them due to social distancing guidelines and our immunocompromised population.

## 2024-04-12 ENCOUNTER — Inpatient Hospital Stay: Payer: Self-pay | Attending: Hematology

## 2024-04-12 VITALS — BP 142/75 | HR 65 | Temp 97.5°F | Resp 18

## 2024-04-12 DIAGNOSIS — N920 Excessive and frequent menstruation with regular cycle: Secondary | ICD-10-CM | POA: Insufficient documentation

## 2024-04-12 DIAGNOSIS — D500 Iron deficiency anemia secondary to blood loss (chronic): Secondary | ICD-10-CM | POA: Insufficient documentation

## 2024-04-12 DIAGNOSIS — D509 Iron deficiency anemia, unspecified: Secondary | ICD-10-CM

## 2024-04-12 MED ORDER — IRON SUCROSE 400 MG IVPB - SIMPLE MED
400.0000 mg | Freq: Once | Status: AC
  Administered 2024-04-12: 400 mg via INTRAVENOUS
  Filled 2024-04-12: qty 400

## 2024-04-12 MED ORDER — SODIUM CHLORIDE 0.9 % IV SOLN: INTRAVENOUS | Status: DC

## 2024-04-12 NOTE — Patient Instructions (Signed)
 Instrucciones al darle de alta: Discharge Instructions Gracias por elegir al [facility] de Cncer de [place] para brindarle atencin mdica de oncologa y Teacher, English as a foreign language.  Si usted tiene cita de laboratorio con [employer] de [place], por favor entre por la puerta principal y regstrese en el escritorio central de informacin.   Use ropa cmoda y [place] para tener fcil acceso a las vas del Portacath (acceso venoso de Set designer duracin) o la lnea PICC (catter central colocado por va perifrica).   Nos esforzamos por ofrecerle tiempo de calidad con su proveedor. Es posible que tenga que volver a programar su cita si llega tarde (15 minutos o ms).  El llegar tarde le afecta a usted y a otros pacientes cuyas citas son posteriores a Armed forces operational officer.  Adems, si usted falta a tres o ms citas sin avisar a la oficina, puede ser retirado(a) de la clnica a discrecin del proveedor.      Para las solicitudes de renovacin de recetas, pida a su farmacia que se ponga en contacto con nuestra oficina y deje que transcurran 72 horas para que se complete el proceso de las renovaciones.    Hoy usted recibi los siguientes agentes de quimioterapia e/o inmunoterapia Venofer, return as scheduled.   Para ayudar a prevenir las nuseas y los vmitos despus de su tratamiento, le recomendamos que tome su medicamento para las nuseas segn las indicaciones.  LOS SNTOMAS QUE DEBEN COMUNICARSE INMEDIATAMENTE SE INDICAN A CONTINUACIN: *FIEBRE SUPERIOR A 100.4 F (38 C) O MS *ESCALOFROS O SUDORACIN *NUSEAS Y VMITOS QUE NO SE CONTROLAN CON EL MEDICAMENTO PARA LAS NUSEAS *DIFICULTAD INUSUAL PARA RESPIRAR  *MORETONES O HEMORRAGIAS NO HABITUALES *PROBLEMAS URINARIOS (dolor o ardor al Geographical information systems officer o frecuencia para Geographical information systems officer) *PROBLEMAS INTESTINALES (diarrea inusual, estreimiento, dolor cerca del ano) SENSIBILIDAD EN LA BOCA Y EN LA GARGANTA CON O SIN LA PRESENCIA DE LCERAS (dolor de garganta, llagas en la boca o dolor de  muelas/dientes) ERUPCIN, HINCHAZN O DOLORES INUSUALES FLUJO VAGINAL INUSUAL O PICAZN/RASQUIA    Los puntos marcados con un asterisco ( *) indican una posible emergencia y debe hacer un seguimiento tan pronto como le sea posible o vaya al Departamento de Emergencias si se le presenta algn problema.  Por favor, muestre la [place] DE ADVERTENCIA DE Marc Morgans DE ADVERTENCIA DE Gardiner Fanti al registrarse en [address] de Emergencias y a la enfermera de triaje.  Si tiene preguntas despus de su visita o necesita cancelar o volver a programar su cita, por favor pngase en contacto con [facility] [place] - A DEPT OF Eligha Bridegroom [facility] [phone]  y siga las instrucciones. Las horas de oficina son de 8:00 a.m. a 4:30 p.m. de lunes a viernes. Por favor, tenga en cuenta que los mensajes de voz que se dejan despus de las 4:00 p.m. posiblemente no se devolvern hasta el siguiente da de [place].  Cerramos los fines de semana y [employer]. En todo momento tiene acceso a una enfermera para preguntas urgentes. Por favor, llame al nmero principal de la clnica [phone] y siga las instrucciones.   Para cualquier pregunta que no sea de carcter urgente, tambin puede ponerse en contacto con su proveedor [employer]. Ahora ofrecemos visitas electrnicas para cualquier persona mayor de 18 aos que solicite atencin mdica en lnea para los sntomas que no sean urgentes. Para ms detalles vaya a [email or web address].   Tambin puede bajar la aplicacin de MyChart! Vaya a la tienda de aplicaciones, busque "MyChart",  abra la aplicacin, seleccione [place], e ingrese con su nombre de usuario y la contrasea de Clinical cytogeneticist.

## 2024-04-12 NOTE — Progress Notes (Signed)
Patient reports taking premedications at home. Patient tolerated iron infusion with no complaints voiced. Peripheral IV site clean and dry with good blood return noted before and after infusion. Band aid applied. VSS with discharge and left in satisfactory condition with no s/s of distress noted.

## 2024-04-19 ENCOUNTER — Inpatient Hospital Stay: Payer: Self-pay

## 2024-04-19 VITALS — BP 136/93 | HR 63 | Temp 97.0°F | Resp 18

## 2024-04-19 DIAGNOSIS — D509 Iron deficiency anemia, unspecified: Secondary | ICD-10-CM

## 2024-04-19 MED ORDER — SODIUM CHLORIDE 0.9 % IV SOLN: INTRAVENOUS | Status: DC

## 2024-04-19 MED ORDER — IRON SUCROSE 400 MG IVPB - SIMPLE MED
400.0000 mg | Freq: Once | Status: AC
  Administered 2024-04-19: 400 mg via INTRAVENOUS
  Filled 2024-04-19: qty 400

## 2024-04-19 NOTE — Progress Notes (Signed)
 Patient tolerated iron infusion with no complaints voiced.  Peripheral IV site clean and dry with good blood return noted before and after infusion.  Band aid applied. Pt observed for 30 minutes post iron infusion without any complications.  VSS with discharge and left in satisfactory condition with no s/s of distress noted. All follow ups as scheduled.   Michele Salas

## 2024-04-19 NOTE — Patient Instructions (Signed)

## 2024-04-22 ENCOUNTER — Encounter: Payer: Self-pay | Admitting: Physician Assistant

## 2024-04-22 ENCOUNTER — Ambulatory Visit: Payer: Self-pay | Admitting: Physician Assistant

## 2024-04-22 VITALS — BP 129/81 | HR 94 | Temp 97.8°F

## 2024-04-22 DIAGNOSIS — K219 Gastro-esophageal reflux disease without esophagitis: Secondary | ICD-10-CM

## 2024-04-22 DIAGNOSIS — D509 Iron deficiency anemia, unspecified: Secondary | ICD-10-CM

## 2024-04-22 DIAGNOSIS — Z789 Other specified health status: Secondary | ICD-10-CM

## 2024-04-22 MED ORDER — OMEPRAZOLE 40 MG PO CPDR: 40.0000 mg | DELAYED_RELEASE_CAPSULE | Freq: Every day | ORAL | 1 refills | Status: AC | PRN

## 2024-04-22 NOTE — Progress Notes (Signed)
   BP 129/81   Pulse 94   Temp 97.8 F (36.6 C)   SpO2 98%    Subjective:    Patient ID: Michele Salas, female    DOB: Jun 18, 1981, 43 y.o.   MRN: [id number]  HPI: Michele Salas is a 43 y.o. female presenting on 04/22/2024 for No chief complaint on file.   HPI  Pt is 43yoF who is in today for routine follow up.  She says she is doing well overall.  She has some Low energy since her iron  low.  Her Labs August good  She is not exercising. She works sometimes cleaning houses.  Omeprazole  is working well for her.  Sometimes she gets bloated.    Relevant past medical, surgical, family and social history reviewed and updated as indicated. Interim medical history since our last visit reviewed. Allergies and medications reviewed and updated.   Current Outpatient Medications:    omeprazole  (PRILOSEC) 40 MG capsule, Take 1 capsule (40 mg total) by mouth daily as needed., Disp: 30 capsule, Rfl: 6   Vitamin D , Ergocalciferol , (DRISDOL ) 1.25 MG (50000 UNIT) CAPS capsule, Take 1 capsule (50,000 Units total) by mouth once a week., Disp: 16 capsule, Rfl: 3    Review of Systems  Per HPI unless specifically indicated above     Objective:    BP 129/81   Pulse 94   Temp 97.8 F (36.6 C)   SpO2 98%   Wt Readings from Last 3 Encounters:  04/01/24 152 lb 14.4 oz (69.4 kg)  12/25/23 152 lb 4.8 oz (69.1 kg)  10/24/23 150 lb 8 oz (68.3 kg)    Physical Exam Vitals reviewed.  Constitutional:      General: She is not in acute distress.    Appearance: She is well-developed. She is not toxic-appearing.  HENT:     Head: Normocephalic and atraumatic.  Cardiovascular:     Rate and Rhythm: Normal rate and regular rhythm.  Pulmonary:     Effort: Pulmonary effort is normal.     Breath sounds: Normal breath sounds.  Abdominal:     General: Bowel sounds are normal.     Palpations: Abdomen is soft. There is no splenomegaly, mass or pulsatile mass.     Tenderness: There is no abdominal  tenderness. There is no guarding or rebound.  Musculoskeletal:     Cervical back: Neck supple.     Right lower leg: No edema.     Left lower leg: No edema.  Lymphadenopathy:     Cervical: No cervical adenopathy.  Skin:    General: Skin is warm and dry.  Neurological:     Mental Status: She is alert and oriented to person, place, and time.  Psychiatric:        Behavior: Behavior normal.           Assessment & Plan:    Encounter Diagnoses  Name Primary?   Gastroesophageal reflux disease, unspecified whether esophagitis present Yes   Iron  deficiency anemia, unspecified iron  deficiency anemia type    Not proficient in [place] language      -pt to continue omeprazole  prn -pt encouraged regular exercise to help energy levels -pt to continue with hematology for anemia -pt to follow up six months.  She is to contact office sooner prn

## 2024-04-23 ENCOUNTER — Ambulatory Visit: Payer: Self-pay | Admitting: Physician Assistant

## 2024-05-13 ENCOUNTER — Ambulatory Visit: Payer: Self-pay | Admitting: Obstetrics & Gynecology

## 2024-05-13 ENCOUNTER — Other Ambulatory Visit (HOSPITAL_COMMUNITY)
Admission: RE | Admit: 2024-05-13 | Discharge: 2024-05-13 | Disposition: A | Discharge status: Home / Self Care | Payer: Self-pay | Source: Ambulatory Visit | Attending: Obstetrics & Gynecology | Admitting: Obstetrics & Gynecology

## 2024-05-13 ENCOUNTER — Encounter: Payer: Self-pay | Admitting: Obstetrics & Gynecology

## 2024-05-13 VITALS — BP 133/83 | HR 76 | Ht 63.39 in | Wt 155.2 lb

## 2024-05-13 DIAGNOSIS — C50112 Malignant neoplasm of central portion of left female breast: Secondary | ICD-10-CM

## 2024-05-13 DIAGNOSIS — N939 Abnormal uterine and vaginal bleeding, unspecified: Secondary | ICD-10-CM | POA: Insufficient documentation

## 2024-05-13 DIAGNOSIS — Z862 Personal history of diseases of the blood and blood-forming organs and certain disorders involving the immune mechanism: Secondary | ICD-10-CM

## 2024-05-13 DIAGNOSIS — Z171 Estrogen receptor negative status [ER-]: Secondary | ICD-10-CM

## 2024-05-13 NOTE — Progress Notes (Signed)
 GYN VISIT Patient name: Michele Salas MRN [id number]  Date of birth: 23-Aug-1980 Chief Complaint:   Menorrhagia  History of Present Illness:   Michele Salas is a 43 y.o. G67P3003 female being seen today for the following concerns:  Menses are irregular sometimes 3-5 weeks.  Menses will last for 8 days.  Notes moderate to heavy bleeding the first 5 days.  Using 4-5 larges pads per day.  On occasion may have an accident due to the bleeding.  +Dysmenorrhea.   Notes that she was prescribed a medication by her PCP and she does not take it because it does not help.  Reports that menses are impacting her quality of life as she often has to stay home during her menses.  Lab work reviewed: 03/2024: Hgb 12.7, normal iron  panel.  Pt with h/o iron  def anemia  Pt with h/o breast Ca: h/o ER/PR Negative, HER2 negative  Spanish interpreter present  Patient's last menstrual period was 04/17/2024 (exact date).    Review of Systems:   Pertinent items are noted in HPI Denies fever/chills, dizziness, headaches, visual disturbances, fatigue, shortness of breath, chest pain, abdominal pain, vomiting.  Denies urinary or GI symptoms. Pertinent History Reviewed:   Past Surgical History:  Procedure Laterality Date   BREAST ENHANCEMENT SURGERY Right 05/19/2017   Procedure: Augmentation Right Breast with Saline Implant for symmetry;  Surgeon: Arelia Filippo, MD;  Location: [facility];  Service: Plastics;  Laterality: Right;   LATISSIMUS FLAP TO BREAST Left 02/06/2017   Procedure: LEFT LATISSIMUS FLAP TO BREAST;  Surgeon: Arelia Filippo, MD;  [facility];  Service: Plastics;  Laterality: Left;   MASTECTOMY MODIFIED RADICAL Left 12/09/2015   Procedure: LEFT MODIFIED RADICAL MASTECTOMY;  Surgeon: Oneil Budge, MD;  Location: [facility];  Service: General;  Laterality: Left;   MASTOPEXY Right 12/22/2017   Procedure: RIGHT MASTOPEXY;  Surgeon: Arelia Filippo, MD;  Location: [facility];  Service: Plastics;  Laterality: Right;   PORT-A-CATH REMOVAL Right 05/19/2017   Procedure: REMOVAL PORT-A-CATH;  Surgeon: Arelia Filippo, MD;  Location: [facility];  Service: Plastics;  Laterality: Right;   PORTACATH PLACEMENT Right 12/09/2015   Procedure: INSERTION PORT-A-CATH RIGHT SUBCLAVIAN;  Surgeon: Oneil Budge, MD;  Location: [facility];  Service: General;  Laterality: Right;   REMOVAL OF TISSUE EXPANDER AND PLACEMENT OF IMPLANT Left 05/19/2017   Procedure: REMOVAL OF LEFT TISSUE EXPANDER AND PLACEMENT OF SALINE IMPLANT;  Surgeon: Arelia Filippo, MD;  Location: [facility];  Service: Plastics;  Laterality: Left;   SKIN FULL THICKNESS GRAFT Left 12/22/2017   Procedure: LEFT NIPPLE AREOLAR COMPLEX RECONSTRUCTION WITH LOCAL FLAP  AND FULL THICKNESS SKIN GRAFT FROM LEFT GROIN;  Surgeon: Arelia Filippo, MD;  Location: [facility];  Service: Plastics;  Laterality: Left;   TISSUE EXPANDER PLACEMENT Left 02/06/2017   Procedure: TISSUE EXPANDER PLACEMENT LEFT BREAST;  Surgeon: Arelia Filippo, MD;  Location: [facility];  Service: Plastics;  Laterality: Left;    Past Medical History:  Diagnosis Date   History of breast cancer 10/2015   left   History of chemotherapy 2017   History of radiation therapy    PONV (postoperative nausea and vomiting)    nausea only   Reviewed problem list, medications and allergies. Physical Assessment:   Vitals:   05/13/24 1024  BP: 133/83  Pulse: 76  Weight: 155 lb 3.2 oz (70.4 kg)  Height: 5' 3.39 (1.61 m)  Body mass index is 27.16 kg/m.  Physical Examination:   General appearance: alert, well appearing, and in no distress  Psych: mood appropriate, normal affect  Skin: warm & dry   Cardiovascular: normal heart rate noted  Respiratory: normal respiratory effort, no distress  Abdomen: soft, non-tender, no rebound or guarding  Pelvic: VULVA: normal appearing vulva with no masses,  tenderness or lesions, VAGINA: normal appearing vagina with normal color and discharge, no lesions, CERVIX: - slightly dislocated to patient's left- normal appearing cervix without discharge or lesions, UTERUS: uterus is normal size, shape, consistency and nontender  Extremities: no edema   Chaperone: Aleck Blase    Endometrial Biopsy Procedure Note  Pre-operative Diagnosis: Abnormal uterine bleeding  Post-operative Diagnosis: same  Procedure Details  Menses about to start.  The risks (including infection, bleeding, pain, and uterine perforation) and benefits of the procedure were explained to the patient and Written informed consent was obtained.  Antibiotic prophylaxis against endocarditis was not indicated.   The patient was placed in the dorsal lithotomy position.  Bimanual exam showed the uterus to be in the neutral position.  A speculum inserted in the vagina, and the cervix prepped with betadine .     A single tooth tenaculum was applied to the anterior lip of the cervix for stabilization.  A Pipelle endometrial aspirator was used to sample the endometrium.  Sample was sent for pathologic examination.  Condition: Stable  Complications: None   Assessment & Plan:  1) AUB, h/o breast Ca and iron  def. anemia - Due to patient's history of breast CA, hormonal interventions are contraindicated discussed management options including TXA or surgical intervention - Discussed endometrial ablation reviewed risk benefits - Discussed hysterectomy including risk benefit as well as anticipated recovery - Patient seems very unsure about her options and how she desired to proceed -plan for pelvic ultrasound for further evaluation of uterus and EMB completed today to rule out underlying etiology - Patient works cleaning homes and would anticipate she would need at least 8 weeks off of work should she desire hysterectomy.  After much discussion it seems as though she may be leaning towards  endometrial ablation - Will also check with oncology team to see if Mirena may be a potential last resort option []  next appt with pelvic US         Orders Placed This Encounter  Procedures   US  PELVIC COMPLETE WITH TRANSVAGINAL    Return for pelvic US  (our office next available) then visit with me after.   Demyah Smyre, DO Attending Obstetrician & Gynecologist, [facility] for [employer], [facility]

## 2024-05-14 ENCOUNTER — Ambulatory Visit: Payer: Self-pay | Admitting: Obstetrics & Gynecology

## 2024-05-14 ENCOUNTER — Other Ambulatory Visit: Payer: Self-pay

## 2024-05-14 LAB — SURGICAL PATHOLOGY

## 2024-06-06 ENCOUNTER — Ambulatory Visit: Payer: Self-pay

## 2024-06-06 ENCOUNTER — Ambulatory Visit (INDEPENDENT_AMBULATORY_CARE_PROVIDER_SITE_OTHER): Payer: Self-pay | Admitting: Obstetrics & Gynecology

## 2024-06-06 ENCOUNTER — Encounter: Payer: Self-pay | Admitting: Obstetrics & Gynecology

## 2024-06-06 VITALS — BP 155/95 | HR 72 | Ht 63.39 in | Wt 153.8 lb

## 2024-06-06 DIAGNOSIS — N939 Abnormal uterine and vaginal bleeding, unspecified: Secondary | ICD-10-CM

## 2024-06-06 DIAGNOSIS — D252 Subserosal leiomyoma of uterus: Secondary | ICD-10-CM

## 2024-06-06 DIAGNOSIS — N840 Polyp of corpus uteri: Secondary | ICD-10-CM

## 2024-06-06 DIAGNOSIS — N83201 Unspecified ovarian cyst, right side: Secondary | ICD-10-CM

## 2024-06-06 DIAGNOSIS — N83291 Other ovarian cyst, right side: Secondary | ICD-10-CM

## 2024-06-06 DIAGNOSIS — D259 Leiomyoma of uterus, unspecified: Secondary | ICD-10-CM

## 2024-06-06 DIAGNOSIS — N8501 Benign endometrial hyperplasia: Secondary | ICD-10-CM

## 2024-06-06 DIAGNOSIS — C50112 Malignant neoplasm of central portion of left female breast: Secondary | ICD-10-CM

## 2024-06-06 DIAGNOSIS — Z862 Personal history of diseases of the blood and blood-forming organs and certain disorders involving the immune mechanism: Secondary | ICD-10-CM

## 2024-06-06 DIAGNOSIS — D251 Intramural leiomyoma of uterus: Secondary | ICD-10-CM

## 2024-06-06 MED ORDER — TRANEXAMIC ACID 650 MG PO TABS: ORAL_TABLET | ORAL | 6 refills | Status: AC

## 2024-06-06 NOTE — Progress Notes (Signed)
 PELVIC US  TA/TV: heterogeneous anteverted uterus with multiple fibroids,anterior right intramural fibroid 3.3 x 2 x 2.6 cm,(#2) anterior right intramural fibroid 1.5 x 1.4 x 1.3 cm,(#3) posterior subserosal fibroid 1.9 x .9 x 1.6 cm,EEC 10 mm, hypoechoic endometrial mass 1.1 x 1.3 x .9 cm,simple unilocular right ovarian cyst 4.4 x 3.6 x 2.9 cm,normal left ovary,ovaries appear mobile,no free fluid,no pain during ultrasound  [employer] (interpreter)

## 2024-06-06 NOTE — Progress Notes (Signed)
 GYN VISIT Patient name: Michele Salas MRN [id number]  Date of birth: [birth year] Chief Complaint:   No chief complaint on file.  History of Present Illness:   Michele Salas is a 43 y.o. G54P3003  female being seen today for follow up regarding:  At her last visit she had noted AUB.  She noted that menses were regular every 3 to 5 weeks.  Bleeding lasted for 8 days heavy the first 5 days using up to 4-5 large pads per day.   Today she reports no acute complaints or changes since her last visit  - Patient also has history of breast CA, reviewed with oncologist who recommended avoiding any hormonal intervention  -Due to iron  deficiency anemia, patient is receiving IV iron  every 3 months  Patient's last menstrual period was 05/17/2024 (exact date).  US  today: heterogeneous anteverted uterus with multiple fibroids,anterior right intramural fibroid 3.3 x 2 x 2.6 cm,(#2) anterior right intramural fibroid 1.5 x 1.4 x 1.3 cm,(#3) posterior subserosal fibroid 1.9 x .9 x 1.6 cm,EEC 10 mm, hypoechoic endometrial mass 1.1 x 1.3 x .9 cm,simple unilocular right ovarian cyst 4.4 x 3.6 x 2.9 cm,normal left ovary,ovaries appear mobile,no free fluid,no pain during ultrasound   Spanish interpreter present  Review of Systems:   Pertinent items are noted in HPI Denies fever/chills, dizziness, headaches, visual disturbances, fatigue, shortness of breath, chest pain, abdominal pain, vomiting Pertinent History Reviewed:   Past Surgical History:  Procedure Laterality Date   BREAST ENHANCEMENT SURGERY Right 05/19/2017   Procedure: Augmentation Right Breast with Saline Implant for symmetry;  Surgeon: Arelia Filippo, MD;  Location: [facility];  Service: Plastics;  Laterality: Right;   LATISSIMUS FLAP TO BREAST Left 02/06/2017   Procedure: LEFT LATISSIMUS FLAP TO BREAST;  Surgeon: Arelia Filippo, MD;  Location: [facility];  Service: Plastics;  Laterality: Left;   MASTECTOMY MODIFIED RADICAL  Left 12/09/2015   Procedure: LEFT MODIFIED RADICAL MASTECTOMY;  Surgeon: Oneil Budge, MD;  Location: [facility];  Service: General;  Laterality: Left;   MASTOPEXY Right 12/22/2017   Procedure: RIGHT MASTOPEXY;  Surgeon: Arelia Filippo, MD;  Location: [facility];  Service: Plastics;  Laterality: Right;   PORT-A-CATH REMOVAL Right 05/19/2017   Procedure: REMOVAL PORT-A-CATH;  Surgeon: Arelia Filippo, MD;  Location: [facility];  Service: Plastics;  Laterality: Right;   PORTACATH PLACEMENT Right 12/09/2015   Procedure: INSERTION PORT-A-CATH RIGHT SUBCLAVIAN;  Surgeon: Oneil Budge, MD;  [facility]: [facility];  Service: General;  Laterality: Right;   REMOVAL OF TISSUE EXPANDER AND PLACEMENT OF IMPLANT Left 05/19/2017   Procedure: REMOVAL OF LEFT TISSUE EXPANDER AND PLACEMENT OF SALINE IMPLANT;  Surgeon: Arelia Filippo, MD;  Location: [facility];  Service: Plastics;  Laterality: Left;   SKIN FULL THICKNESS GRAFT Left 12/22/2017   Procedure: LEFT NIPPLE AREOLAR COMPLEX RECONSTRUCTION WITH LOCAL FLAP  AND FULL THICKNESS SKIN GRAFT FROM LEFT GROIN;  Surgeon: Arelia Filippo, MD;  [facility];  Service: Plastics;  Laterality: Left;   TISSUE EXPANDER PLACEMENT Left 02/06/2017   Procedure: TISSUE EXPANDER PLACEMENT LEFT BREAST;  Surgeon: Arelia Filippo, MD;  Location: [facility];  Service: Plastics;  Laterality: Left;    Past Medical History:  Diagnosis Date   History of breast cancer 10/2015   left   History of chemotherapy 2017   History of radiation therapy    PONV (postoperative nausea and vomiting)    nausea only   Reviewed problem list, medications and allergies.  Physical Assessment:   Vitals:   06/06/24 1557 06/06/24 1559  BP: (!) 148/82 (!) 155/95  Pulse: 72   Weight: 153 lb 12.8 oz (69.8 kg)   Height: 5' 3.39 (1.61 m)   Body mass index is 26.91 kg/m.       Physical Examination:   General appearance: alert, well  appearing, and in no distress  Psych: mood appropriate, normal affect  Skin: warm & dry   Cardiovascular: normal heart rate noted  Respiratory: normal respiratory effort, no distress  Abdomen: soft, non-tender   Pelvic: examination not indicated  Extremities: no edema   Chaperone: N/A    Assessment & Plan:  1) AUB, uterine fibroids, uterine polyp - Due to prior medical history, reviewed options are limited -Reviewed today's ultrasound as well as negative prior EMB - Discussed TXA - Discussed surgical intervention reviewed endometrial ablation, discussed risk benefit including that 90% of women saw improvement of their bleeding though not complete resolution --Discussed plan for robotic- assisted laparoscopic hysterectomy and bilateral salpingectomy  -Explained that this surgery is performed to remove the uterus through several small incisions in the abdomen- pending anatomy 3-5 ports. I discussed the risks and benefits of the surgery, including, but not limited to risk of bleeding, including the need for blood transfusion, infection, damage to surrounding organs and tissues such as damage to bladder, ureter or bowel that would requiring additional procedures.  Reviewed long term complications such as fistula or dehiscence requiring further surgical intervention.  Discussed possible need for conversion to an open procedure and potential for other complications that cannot be predicted or prevented  -reviewed same day procedure -typical recovery 8-12 wks with several weeks off work and 8wks of pelvic rest -questions/concerns were addressed  - Patient does not have insurance, will plan to complete financial assistance.  Once she has completed and obtained assistance, we will plan to move forward with surgery as she is leaning towards hysterectomy  For now we will trial TXA until's assistance and surgery can be scheduled  2) Right ovarian cyst - Reviewed findings on ultrasound benign  finding []  Showed surgical intervention be pursued will remove cyst if still present  No orders of the defined types were placed in this encounter.   No follow-ups on file.   Lelania Bia, DO Attending Obstetrician & Gynecologist, [facility] for [employer], [facility]

## 2024-06-24 ENCOUNTER — Inpatient Hospital Stay: Payer: Self-pay | Attending: Hematology

## 2024-06-24 DIAGNOSIS — Z853 Personal history of malignant neoplasm of breast: Secondary | ICD-10-CM | POA: Diagnosis present

## 2024-06-24 DIAGNOSIS — D500 Iron deficiency anemia secondary to blood loss (chronic): Secondary | ICD-10-CM | POA: Insufficient documentation

## 2024-06-24 DIAGNOSIS — E559 Vitamin D deficiency, unspecified: Secondary | ICD-10-CM | POA: Insufficient documentation

## 2024-06-24 DIAGNOSIS — Z923 Personal history of irradiation: Secondary | ICD-10-CM | POA: Diagnosis not present

## 2024-06-24 DIAGNOSIS — N920 Excessive and frequent menstruation with regular cycle: Secondary | ICD-10-CM | POA: Diagnosis present

## 2024-06-24 DIAGNOSIS — Z9221 Personal history of antineoplastic chemotherapy: Secondary | ICD-10-CM | POA: Diagnosis not present

## 2024-06-24 LAB — CBC WITH DIFFERENTIAL/PLATELET
Abs Immature Granulocytes: 0.07 K/uL (ref 0.00–0.07)
Basophils Absolute: 0.1 K/uL (ref 0.0–0.1)
Basophils Relative: 1 %
Eosinophils Absolute: 0.2 K/uL (ref 0.0–0.5)
Eosinophils Relative: 2 %
HCT: 42.7 % (ref 36.0–46.0)
Hemoglobin: 14.9 g/dL (ref 12.0–15.0)
Immature Granulocytes: 1 %
Lymphocytes Relative: 26 %
Lymphs Abs: 2.3 K/uL (ref 0.7–4.0)
MCH: 31 pg (ref 26.0–34.0)
MCHC: 34.9 g/dL (ref 30.0–36.0)
MCV: 88.8 fL (ref 80.0–100.0)
Monocytes Absolute: 0.6 K/uL (ref 0.1–1.0)
Monocytes Relative: 7 %
Neutro Abs: 5.5 K/uL (ref 1.7–7.7)
Neutrophils Relative %: 63 %
Platelets: 276 K/uL (ref 150–400)
RBC: 4.81 MIL/uL (ref 3.87–5.11)
RDW: 13 % (ref 11.5–15.5)
WBC: 8.6 K/uL (ref 4.0–10.5)
nRBC: 0 % (ref 0.0–0.2)

## 2024-06-24 LAB — IRON AND TIBC
Iron: 108 ug/dL (ref 28–170)
Saturation Ratios: 28 % (ref 10.4–31.8)
TIBC: 388 ug/dL (ref 250–450)
UIBC: 280 ug/dL

## 2024-06-24 LAB — FERRITIN: Ferritin: 147 ng/mL (ref 11–307)

## 2024-06-29 NOTE — Progress Notes (Unsigned)
 [facility] [address] [place], [place] [address]   CLINIC:  Medical Oncology/Hematology  PCP:  Michele Kirsch, Salas [address] / [place] [address] [phone]   REASON FOR VISIT:    Follow-up for history of left breast triple negative breast cancer + iron  deficiency anemia  BRIEF ONCOLOGIC HISTORY:   Oncology History  Cancer of central portion of left female breast (HCC)  09/16/2015 Mammogram   1.6 cm retroareolar left breast mass measuring 1.6 cm in largest diameter in addition to 3 abnormal appearing left inferior axillary lymph nodes.   09/23/2015 Initial Biopsy   US  guided core biopsy of left breast mass and suspicious left axillary lymph nodes.   09/23/2015 Pathology Results   Invasive ductal carcinoma, high grade.  Left axilla lymph node is positive for metastatic carcinoma.  ER/PR NEGATIVE, HER2 NEGATIVE, KI-67 marker of 85%.   10/26/2015 Imaging   Bone scan- No evidence of metastatic disease to the skeleton.   10/27/2015 Imaging   CT CAP-  Retroareolar mass with 2 asymmetrically prominent left axillary lymph nodes and an upper normal size left subpectoral lymph node. No other findings of metastatic disease in the chest, abdomen, or pelvis.   11/05/2015 Echocardiogram   The estimated ejection   fraction was in the range of 60% to 65%.   11/23/2015 Miscellaneous   Genetic Counseling- genetic test result was negative for any known pathogenic mutations within any of 28 genes that would cause her to be at an increased genetic risk for breast, ovarian, or other related cancers.   12/09/2015 Surgery   L radical mastectomy, axillary LN dissection, port a cath insertion with Michele Salas 12/09/2015   12/11/2015 Pathology Results   - INVASIVE GRADE III DUCTAL CARCINOMA, SPANNING 2.3 CM IN GREATEST DIMENSION. - ASSOCIATED HIGH GRADE DCIS - MARGINS ARE NEGATIVE. 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 - EIGHT ADDITIONAL LYMPH  NODES WITH NO TUMOR SEEN   12/11/2015 Pathology Results   HER2 NEGATIVE, ER 0%, PR 0%, Ki-67 marker 90%   12/25/2015 - 02/12/2016 Chemotherapy   AC every 2 weeks x 4 cycles   02/04/2016 Treatment Plan Change   Treatment deferred x 7 days due to neutropenia   02/25/2016 - 05/12/2016 Chemotherapy   Paclitaxel  weekly x 12   06/01/2016 - 07/19/2016 Radiation Therapy   33 fractions, Michele [place] in [place]   02/06/2017 Surgery   (L) breast reconstruction (lat flap with tissue expander) with Michele Salas     CANCER STAGING:  Cancer Staging  Cancer of central portion of left female breast [facility]) Staging form: Breast, AJCC 7th Edition - Pathologic stage from 12/11/2015: Stage IIB (T2, N1a, cM0) - Signed by Michele Debby RAMAN, Salas on 12/31/2015   INTERVAL HISTORY:   Michele Salas, a 43 y.o. female, returns for routine follow-up of her history of left breast triple negative breast cancer and iron  deficiency anemia. Michele Salas was last seen on 04/01/2024 by Michele Salas.  At today's visit, she reports feeling fair. She reports 50% energy and 100% appetite.  She is maintaining stable weight at this time.  HISTORY OF BREAST CANCER:   She denies any new breast lumps or axillary lymphadenopathy. Denies any new aches or pains.   IRON  DEFICIENCY ANEMIA: She reports improved energy after her most recent IV iron  (Venofer  400 mg x 2 in September 2025), but still has some residual fatigue. She follows with GYN due to her heavy menstrual periods (  with uterine fibroids and uterine polyp).  Per most recent GYN note (06/06/2024), she is considering hysterectomy.  In the meantime, she is on a trial of tranexamic acid .  Bleeding has been slightly improved after starting tranexamic acid . She denies any melena or rectal bleeding.   She reports intermittent headaches.   ASSESSMENT & PLAN:  1.   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  She had  a mammogram performed 09/16/2015 that showed a retroareolar left breast mass measuring 1.6 cm with 3 abnormal left axillary lymph nodes noted. - She had a biopsy 09/23/2015 of the left breast mass as well as a left axillary lymph node. PATHOLOGY: Invasive ductal carcinoma high-grade left axillary lymph node was positive for metastatic cancer. ER-/PR-/HER2- Ki-67 was 85%. - Bone scan on 10/26/2015 that was negative. - CT CAP was done 10/27/2015 showed the left breast mass and left axillary lymph nodes with no other evidence of metastatic disease. - Dr. Mavis performed a left mastectomy and axillary lymph node dissection on 12/11/2015.  PATHOLOGY reported invasive ductal grade 3 measuring 2.3 cm.  With 2 out of 10 lymph nodes positive for malignancy. ER-/PR-/HER2- - She was treated with 4 cycles of AC followed by 12 weeks of Taxol  which was completed October 2017. - She was also treated with radiation 33 fractions which was completed in December 2017. - - - - - - - - - - - - - - - - - - - - - - - - - - - BREAST EXAM (04/01/2024): Left breast implant site is within normal limits.  Right breast with palpable implant, no palpable masses.  No palpable adenopathy. - Most recent unilateral right breast mammogram (12/11/2023): BI-RADS Category 1, negative.  No mammographic evidence of malignancy in the right breast. - Most recent labs (12/11/2023): Normal CBC.  LFTs normal.  Previously noted to have transaminitis (peak ALT 78, peak AST 48).  Liver US  from May 2023 consistent with fatty liver disease. - PLAN: Breast exam every 6 to 12 months.  Next due around May 2026.  - Check LFTs annually (next due May 2026) - Annual mammogram due May 2026.   2.  Iron  deficiency anemia: - She has history of menorrhagia.  No bright red blood per rectum or melena. - She follows with GYN due to her heavy menstrual periods (with uterine fibroids and uterine polyp).  Per most recent GYN note (06/06/2024), she is considering  hysterectomy.  In the meantime, she is on a trial of tranexamic acid .  Bleeding has been slightly improved after starting tranexamic acid . - We have recommended against any hormonal treatment of menorrhagia, even progestin only options, due to increased risk of recurrent breast cancer.   - Iron  tablet discontinued due to severe constipation  - She has received intermittent IV iron , most recently with Venofer  400 mg x 09 April 2024 - Most recent labs (06/24/2024): Hgb 14.9/MCV 88.8, ferritin 147, iron  saturation 28% - PLAN: No indication for IV iron  at this time. - Continue following with gynecology for menorrhagia  - Repeat labs and RTC in 6 months.  Informed to contact us  sooner if she has any recurrent symptoms of IDA.     3.   Vitamin D  deficiency: - She is taking vitamin D  50,000 units weekly - Most recent vitamin D  (09/15/2023) is normal (40.49) - PLAN: Continue vitamin D  50,000 units weekly.   Recheck annually.  PLAN SUMMARY:  >>  Mammogram due 12/11/2024  >> Labs in 6 months = CBC/D, CMP, ferritin, iron /TIBC, vitamin D  >> OFFICE visit in 6 months (1 week after labs)    REVIEW OF SYSTEMS:  Review of Systems  Constitutional:  Positive for fatigue. Negative for appetite change, chills, diaphoresis, fever and unexpected weight change.  HENT:   Negative for lump/mass and nosebleeds.   Eyes:  Negative for eye problems.  Respiratory:  Negative for cough, hemoptysis and shortness of breath.   Cardiovascular:  Negative for chest pain, leg swelling and palpitations.  Gastrointestinal:  Positive for constipation. Negative for abdominal pain, blood in stool, diarrhea, nausea and vomiting.  Genitourinary:  Negative for hematuria.   Skin: Negative.   Neurological:  Positive for headaches. Negative for dizziness, light-headedness and numbness.  Hematological:  Does not bruise/bleed easily.   PHYSICAL EXAM:  Performance status (ECOG): 1 - Symptomatic but completely ambulatory  Vitals:    07/01/24 0802  BP: 134/82  Pulse: 74  Resp: 18  Temp: 97.6 F (36.4 C)  SpO2: 95%     Wt Readings from Last 3 Encounters:  07/01/24 155 lb (70.3 kg)  06/06/24 153 lb 12.8 oz (69.8 kg)  05/13/24 155 lb 3.2 oz (70.4 kg)   Physical Exam Constitutional:      Appearance: Normal appearance. She is normal weight.  Cardiovascular:     Heart sounds: Normal heart sounds.  Pulmonary:     Breath sounds: Normal breath sounds.  Neurological:     General: No focal deficit present.     Mental Status: Mental status is at baseline.  Psychiatric:        Behavior: Behavior normal. Behavior is cooperative.      PAST MEDICAL/SURGICAL HISTORY:  Past Medical History:  Diagnosis Date   History of breast cancer 10/2015   left   History of chemotherapy 2017   History of radiation therapy    PONV (postoperative nausea and vomiting)    nausea only   Past Surgical History:  Procedure Laterality Date   BREAST ENHANCEMENT SURGERY Right 05/19/2017   Procedure: Augmentation Right Breast with Saline Implant for symmetry;  Surgeon: Salas Filippo, MD;  Location: [facility];  Service: Plastics;  Laterality: Right;   LATISSIMUS FLAP TO BREAST Left 02/06/2017   Procedure: LEFT LATISSIMUS FLAP TO BREAST;  Surgeon: Salas Filippo, MD;  Location: [facility];  Service: Plastics;  Laterality: Left;   MASTECTOMY MODIFIED RADICAL Left 12/09/2015   Procedure: LEFT MODIFIED RADICAL MASTECTOMY;  Surgeon: Oneil Budge, MD;  Location: [facility];  Service: General;  Laterality: Left;   MASTOPEXY Right 12/22/2017   Procedure: RIGHT MASTOPEXY;  Surgeon: Salas Filippo, MD;  Location: [facility];  Service: Plastics;  Laterality: Right;   PORT-A-CATH REMOVAL Right 05/19/2017   Procedure: REMOVAL PORT-A-CATH;  Surgeon: Salas Filippo, MD;  Location: [facility];  Service: Plastics;  Laterality: Right;   PORTACATH PLACEMENT Right 12/09/2015   Procedure: INSERTION PORT-A-CATH RIGHT  SUBCLAVIAN;  Surgeon: Oneil Budge, MD;  Location: [facility];  Service: General;  Laterality: Right;   REMOVAL OF TISSUE EXPANDER AND PLACEMENT OF IMPLANT Left 05/19/2017   Procedure: REMOVAL OF LEFT TISSUE EXPANDER AND PLACEMENT OF SALINE IMPLANT;  Surgeon: Salas Filippo, MD;  Location:  SURGERY CENTER;  Service: Plastics;  Laterality: Left;   SKIN FULL THICKNESS GRAFT Left 12/22/2017   Procedure: LEFT NIPPLE AREOLAR COMPLEX RECONSTRUCTION WITH LOCAL FLAP  AND FULL THICKNESS SKIN GRAFT FROM LEFT GROIN;  Surgeon: Salas Filippo, MD;  Location: [facility];  Service: Plastics;  Laterality: Left;   TISSUE EXPANDER PLACEMENT Left 02/06/2017   Procedure: TISSUE EXPANDER PLACEMENT LEFT BREAST;  Surgeon: Salas Filippo, MD;  Location: [facility];  Service: Plastics;  Laterality: Left;    SOCIAL HISTORY:  Social History   Socioeconomic History   Marital status: Married    Spouse name: Not on file   Number of children: Not on file   Years of education: Not on file   Highest education level: Not on file  Occupational History   Not on file  Tobacco Use   Smoking status: Never   Smokeless tobacco: Never  Vaping Use   Vaping status: Never Used  Substance and Sexual Activity   Alcohol use: No   Drug use: No   Sexual activity: Yes    Birth control/protection: Condom  Other Topics Concern   Not on file  Social History Narrative   ** Merged History Encounter **       Social Drivers of Corporate Investment Banker Strain: Not on file  Food Insecurity: Not on file  Transportation Needs: Not on file  Physical Activity: Not on file  Stress: Not on file  Social Connections: Not on file  Intimate Partner Violence: Not on file    FAMILY HISTORY:  Family History  Problem Relation Age of Onset   Diabetes Mother    Heart attack Paternal Uncle    Heart attack Paternal Grandfather 29    CURRENT MEDICATIONS:  Current Outpatient Medications  Medication Sig Dispense  Refill   omeprazole  (PRILOSEC) 40 MG capsule Take 1 capsule (40 mg total) by mouth daily as needed. 90 capsule 1   tranexamic acid  (LYSTEDA ) 650 MG TABS tablet Take 2 pills up to 3 times daily as needed for heavy bleeding.  Use for maximum of 5 days each month 30 tablet 6   Vitamin D , Ergocalciferol , (DRISDOL ) 1.25 MG (50000 UNIT) CAPS capsule Take 1 capsule (50,000 Units total) by mouth once a week. 16 capsule 3   No current facility-administered medications for this visit.    ALLERGIES:  No Known Allergies  LABORATORY DATA:  I have reviewed the labs as listed.     Latest Ref Rng & Units 06/24/2024    8:43 AM 03/25/2024    7:56 AM 12/11/2023    9:49 AM  CBC  WBC 4.0 - 10.5 K/uL 8.6  6.5  7.4   Hemoglobin 12.0 - 15.0 g/dL 85.0  87.2  87.2   Hematocrit 36.0 - 46.0 % 42.7  37.4  38.8   Platelets 150 - 400 K/uL 276  310  362       Latest Ref Rng & Units 12/11/2023    9:49 AM 09/15/2023    8:12 AM 12/09/2022   10:20 AM  CMP  Glucose 70 - 99 mg/dL 886  888  884   BUN 6 - 20 mg/dL 10  10  11    Creatinine 0.44 - 1.00 mg/dL 9.47  9.47  9.49   Sodium 135 - 145 mmol/L 136  138  137   Potassium 3.5 - 5.1 mmol/L 4.1  4.0  3.8   Chloride 98 - 111 mmol/L 103  106  104   CO2 22 - 32 mmol/L 23  23  25    Calcium 8.9 - 10.3 mg/dL 9.0  9.0  8.9   Total Protein 6.5 - 8.1 g/dL 7.2  7.1  7.5   Total Bilirubin 0.0 - 1.2 mg/dL 0.6  0.6  0.9   Alkaline Phos 38 - 126 U/L 86  85  80   AST 15 - 41 U/L 23  23  21    ALT 0 - 44 U/L 40  26  29     DIAGNOSTIC IMAGING:  I have independently reviewed the scans and discussed with the patient. US  PELVIC COMPLETE WITH TRANSVAGINAL Result Date: 06/07/2024 Images from the original result were not included.  ..[facility] [employer] [facility] TECHNICAL SALES ENGINEER) accredited practice Center for [facility] [address] [place] [address] Ordering Provider: Ozan, Jennifer, DO                                                                                             GYNECOLOGIC SONOGRAM EXAM: TRANSABDOMINAL AND TRANSVAGINAL ULTRASOUND OF PELVIS  TECHNIQUE: Transabdominal and transvaginal ultrasound examinations of the pelvis were performed. Transabdominal technique was performed for global imaging of the pelvis including uterus, ovaries, adnexal regions, and pelvic cul-de-sac. It was necessary to proceed with endovaginal exam following the transabdominal exam to better visualize and improve detailed examination of  the uterus, endometrium and bilateral ovaries, as well as, evaluation of ovarian blood flow and sliding characteristics. Michele Salas is a 43 y.o. [id number] LMP 05/29/2024 She is here for a pelvic sonogram for abnormal uterine bleeding. Uterus                      7.4 x 5.2 x 5.9 cm, Total uterine volume 118 cc, heterogeneous anteverted uterus with multiple fibroids,anterior right intramural fibroid 3.3 x 2 x 2.6 cm,(#2) anterior right intramural fibroid 1.5 x 1.4 x 1.3 cm,(#3) posterior subserosal fibroid 1.9 x .9 x 1.6 cm Endometrium          10 mm, symmetrical,  hypoechoic endometrial mass 1.1 x 1.3 x .9 cm Right ovary             5.3 x 3 x 4.2 cm, simple unilocular right ovarian cyst 4.4 x 3.6 x 2.9 cm Left ovary                2.5 x 1.7 x 2.5 cm, normal No free fluid Technician Comments: PELVIC US  TA/TV: heterogeneous anteverted uterus with multiple fibroids,anterior right intramural fibroid 3.3 x 2 x 2.6 cm,(#2) anterior right intramural fibroid 1.5 x 1.4 x 1.3 cm,(#3) posterior subserosal fibroid 1.9 x .9 x 1.6 cm,EEC 10 mm, hypoechoic endometrial mass 1.1 x 1.3 x .9 cm,simple unilocular right ovarian cyst 4.4 x 3.6 x 2.9 cm,normal left ovary,ovaries appear mobile,no free fluid,no pain during ultrasound Chaperone Avelina (interpreter) Jeoffrey JINNY Pitts 06/06/2024 3:58 PM Clinical Impression and recommendations: I have reviewed the sonogram results above, combined with the patient's current clinical course, below are my impressions and  any appropriate recommendations for management based on the sonographic findings. Slightly irregular shaped uterus, normal size with several fibroids, measurements as follows: (#1)anterior right intramural fibroid 3.3 x 2 x 2.6 cm (#2) anterior right intramural fibroid 1.5 x 1.4 x 1.3 cm (#3) posterior subserosal fibroid 1.9 x .9 x 1.6 cm Within endometrium hypoechoic mass 1.1  x 1.3 x .9 cm- possible fibroid or polyp Normal left ovary.  Right ovary with unilocular anechoic cyst measuring 4.4 x 3.6 x 2.9 cm Based on the US  findings, O-RADS Cat 2- almost certainly benign.  Based on size, no further intervention indicated (O-RADS US  Risk Stratification and Management System: A Consensus Guidelines from the ACR Ovarian-Adnexal Reporting and Data System, Radiology 2020; [phone]) Jennifer M Ozan 06/07/2024 8:17 AM     WRAP UP:  All questions were answered. The patient knows to call the clinic with any problems, questions or concerns.  Medical decision making: Moderate  Time spent on visit: I spent 20 minutes counseling the patient face to face. The total time spent in the appointment was 30 minutes and more than 50% was on counseling.  NOTE: Visit performed with assistance of certified Spanish language medical interpreter.  Michele CHRISTELLA Barefoot, Salas  07/01/24 8:51 AM

## 2024-07-01 ENCOUNTER — Inpatient Hospital Stay (HOSPITAL_BASED_OUTPATIENT_CLINIC_OR_DEPARTMENT_OTHER): Payer: Self-pay | Admitting: Physician Assistant

## 2024-07-01 VITALS — BP 134/82 | HR 74 | Temp 97.6°F | Resp 18 | Ht 63.19 in | Wt 155.0 lb

## 2024-07-01 DIAGNOSIS — D500 Iron deficiency anemia secondary to blood loss (chronic): Secondary | ICD-10-CM

## 2024-07-01 DIAGNOSIS — Z171 Estrogen receptor negative status [ER-]: Secondary | ICD-10-CM

## 2024-07-01 DIAGNOSIS — C50112 Malignant neoplasm of central portion of left female breast: Secondary | ICD-10-CM

## 2024-07-01 DIAGNOSIS — E559 Vitamin D deficiency, unspecified: Secondary | ICD-10-CM

## 2024-07-01 MED ORDER — VITAMIN D (ERGOCALCIFEROL) 1.25 MG (50000 UNIT) PO CAPS: 50000.0000 [IU] | ORAL_CAPSULE | ORAL | 3 refills | Status: AC

## 2024-07-01 NOTE — Patient Instructions (Signed)
 [facility] at [facility] Discharge Instructions  You were seen today by Pleasant Barefoot PA-C for your follow-up visit.  PLAN SUMMARY: Mammogram in May 2026 Labs and follow-up visit in 6 months.     IRON  DEFICIENCY ANEMIA: Your iron  deficiency anemia is caused by your heavy periods.  Your blood and iron  levels look better after being started on medication by your gynecologist.  You do not need any IV iron  at this time.  Will recheck your labs in 6 months.  Please contact us  sooner if you start to have any increased menstrual bleeding or symptoms of low iron  (increased fatigue, craving for ice, lightheadedness).  HISTORY OF BREAST CANCER: - We will check mammogram and breast exam in 6 months.  VITAMIN D  DEFICIENCY: Continue taking vitamin D  50,000 units each week.  ** Thank you for trusting me with your healthcare!  I strive to provide all of my patients with quality care at each visit.  If you receive a survey for this visit, I would be so grateful to you for taking the time to provide feedback.  Thank you in advance!  ~ Lorrena Goranson                                        Dr. Mickiel Davonna Pleasant Barefoot, PA-C        Delon Hope, NP   - - - - - - - - - - - - - - - - - -     Thank you for choosing [facility] at [facility] to provide your oncology and hematology care.  To afford each patient quality time with our provider, please arrive at least 15 minutes before your scheduled appointment time.   If you have a lab appointment with the Cancer Center please come in thru the Main Entrance and check in at the main information desk.  You need to re-schedule your appointment should you arrive 10 or more minutes late.  We strive to give you quality time with our providers, and arriving late affects you and other patients whose appointments are after yours.  Also, if you no show three or more times for appointments you may be dismissed  from the clinic at the providers discretion.     Again, thank you for choosing [facility].  Our hope is that these requests will decrease the amount of time that you wait before being seen by our physicians.       _____________________________________________________________  Should you have questions after your visit to [facility], please contact our office at [phone] and follow the prompts.  Our office hours are 8:00 a.m. and 4:30 p.m. Monday - Friday.  Please note that voicemails left after 4:00 p.m. may not be returned until the following business day.  We are closed weekends and major holidays.  You do have access to a nurse 24-7, just call the main number to the clinic [phone] and do not press any options, hold on the line and a nurse will answer the phone.    For prescription refill requests, have your pharmacy contact our office and allow 72 hours.    Due to Covid, you will need to wear a mask upon entering the hospital. If you do not have a mask, a mask will be given to  you at the Main Entrance upon arrival. For doctor visits, patients may have 1 support person age 85 or older with them. For treatment visits, patients can not have anyone with them due to social distancing guidelines and our immunocompromised population.

## 2024-10-21 ENCOUNTER — Ambulatory Visit: Payer: Self-pay | Admitting: Physician Assistant

## 2024-12-13 ENCOUNTER — Ambulatory Visit (HOSPITAL_COMMUNITY): Payer: Self-pay

## 2024-12-24 ENCOUNTER — Inpatient Hospital Stay: Payer: Self-pay

## 2024-12-31 ENCOUNTER — Inpatient Hospital Stay: Payer: Self-pay | Admitting: Physician Assistant
